# Patient Record
Sex: Male | Born: 1955 | Race: White | Hispanic: No | Marital: Married | Smoking: Never smoker
Health system: Southern US, Community
[De-identification: ages and names within clinical notes are randomized; demographics above are authoritative.]

## PROBLEM LIST (undated history)

## (undated) DIAGNOSIS — T754XXA Electrocution, initial encounter: Secondary | ICD-10-CM

## (undated) DIAGNOSIS — J45909 Unspecified asthma, uncomplicated: Secondary | ICD-10-CM

## (undated) DIAGNOSIS — J189 Pneumonia, unspecified organism: Secondary | ICD-10-CM

## (undated) DIAGNOSIS — IMO0002 Reserved for concepts with insufficient information to code with codable children: Secondary | ICD-10-CM

## (undated) DIAGNOSIS — G43709 Chronic migraine without aura, not intractable, without status migrainosus: Secondary | ICD-10-CM

## (undated) DIAGNOSIS — T7840XA Allergy, unspecified, initial encounter: Secondary | ICD-10-CM

## (undated) DIAGNOSIS — M797 Fibromyalgia: Secondary | ICD-10-CM

## (undated) DIAGNOSIS — S20229A Contusion of unspecified back wall of thorax, initial encounter: Secondary | ICD-10-CM

## (undated) DIAGNOSIS — I670 Dissection of cerebral arteries, nonruptured: Secondary | ICD-10-CM

## (undated) DIAGNOSIS — F419 Anxiety disorder, unspecified: Secondary | ICD-10-CM

## (undated) DIAGNOSIS — C20 Malignant neoplasm of rectum: Secondary | ICD-10-CM

## (undated) DIAGNOSIS — G8929 Other chronic pain: Secondary | ICD-10-CM

## (undated) DIAGNOSIS — K219 Gastro-esophageal reflux disease without esophagitis: Secondary | ICD-10-CM

## (undated) DIAGNOSIS — I255 Ischemic cardiomyopathy: Secondary | ICD-10-CM

## (undated) DIAGNOSIS — I639 Cerebral infarction, unspecified: Secondary | ICD-10-CM

## (undated) HISTORY — DX: Chronic migraine without aura, not intractable, without status migrainosus: G43.709

## (undated) HISTORY — DX: Anxiety disorder, unspecified: F41.9

## (undated) HISTORY — PX: ROTATOR CUFF REPAIR: SHX139

## (undated) HISTORY — PX: VASECTOMY: SHX75

## (undated) HISTORY — PX: SPINAL CORD STIMULATOR IMPLANT: SHX2422

## (undated) HISTORY — DX: Cerebral infarction, unspecified: I63.9

## (undated) HISTORY — PX: SPINE SURGERY: SHX786

## (undated) HISTORY — PX: COLONOSCOPY: SHX174

## (undated) HISTORY — DX: Contusion of unspecified back wall of thorax, initial encounter: S20.229A

## (undated) HISTORY — DX: Electrocution, initial encounter: T75.4XXA

## (undated) HISTORY — DX: Allergy, unspecified, initial encounter: T78.40XA

## (undated) HISTORY — PX: CHOLECYSTECTOMY: SHX55

## (undated) HISTORY — PX: OTHER SURGICAL HISTORY: SHX169

## (undated) HISTORY — PX: CRYO INTERCOSTAL NERVE BLOCK: SHX6522

## (undated) HISTORY — DX: Fibromyalgia: M79.7

## (undated) HISTORY — DX: Dissection of cerebral arteries, nonruptured: I67.0

## (undated) HISTORY — PX: TMJ ARTHROSCOPY: SHX1067

## (undated) HISTORY — PX: COSMETIC SURGERY: SHX468

## (undated) HISTORY — DX: Reserved for concepts with insufficient information to code with codable children: IMO0002

## (undated) HISTORY — DX: Other chronic pain: G89.29

---

## 1998-08-04 ENCOUNTER — Encounter: Payer: Self-pay | Admitting: Internal Medicine

## 1998-08-04 ENCOUNTER — Ambulatory Visit (HOSPITAL_COMMUNITY): Admission: RE | Admit: 1998-08-04 | Discharge: 1998-08-04 | Payer: Self-pay | Admitting: Internal Medicine

## 1998-08-24 ENCOUNTER — Ambulatory Visit (HOSPITAL_COMMUNITY): Admission: RE | Admit: 1998-08-24 | Discharge: 1998-08-24 | Payer: Self-pay | Admitting: Pulmonary Disease

## 1998-12-30 ENCOUNTER — Encounter: Payer: Self-pay | Admitting: Pulmonary Disease

## 1998-12-30 ENCOUNTER — Ambulatory Visit (HOSPITAL_COMMUNITY): Admission: RE | Admit: 1998-12-30 | Discharge: 1998-12-30 | Payer: Self-pay | Admitting: Pulmonary Disease

## 2001-10-10 ENCOUNTER — Ambulatory Visit (HOSPITAL_COMMUNITY): Admission: RE | Admit: 2001-10-10 | Discharge: 2001-10-10 | Payer: Self-pay | Admitting: Pulmonary Disease

## 2001-10-10 ENCOUNTER — Encounter: Payer: Self-pay | Admitting: Pulmonary Disease

## 2002-01-03 ENCOUNTER — Ambulatory Visit: Admission: RE | Admit: 2002-01-03 | Discharge: 2002-01-03 | Payer: Self-pay | Admitting: Pulmonary Disease

## 2002-03-14 ENCOUNTER — Emergency Department (HOSPITAL_COMMUNITY): Admission: EM | Admit: 2002-03-14 | Discharge: 2002-03-14 | Payer: Self-pay | Admitting: Emergency Medicine

## 2002-03-14 ENCOUNTER — Encounter: Payer: Self-pay | Admitting: Emergency Medicine

## 2002-03-31 ENCOUNTER — Encounter: Payer: Self-pay | Admitting: *Deleted

## 2002-03-31 ENCOUNTER — Inpatient Hospital Stay (HOSPITAL_COMMUNITY): Admission: EM | Admit: 2002-03-31 | Discharge: 2002-04-03 | Payer: Self-pay | Admitting: *Deleted

## 2002-04-01 ENCOUNTER — Encounter: Payer: Self-pay | Admitting: Neurology

## 2002-04-24 ENCOUNTER — Ambulatory Visit (HOSPITAL_COMMUNITY): Admission: RE | Admit: 2002-04-24 | Discharge: 2002-04-24 | Payer: Self-pay | Admitting: Neurology

## 2002-04-24 ENCOUNTER — Encounter: Payer: Self-pay | Admitting: Neurology

## 2002-07-08 ENCOUNTER — Ambulatory Visit (HOSPITAL_COMMUNITY): Admission: RE | Admit: 2002-07-08 | Discharge: 2002-07-08 | Payer: Self-pay | Admitting: Neurology

## 2002-07-08 ENCOUNTER — Encounter: Payer: Self-pay | Admitting: Neurology

## 2005-04-04 ENCOUNTER — Ambulatory Visit (HOSPITAL_COMMUNITY): Admission: RE | Admit: 2005-04-04 | Discharge: 2005-04-04 | Payer: Self-pay | Admitting: Neurology

## 2015-10-15 DIAGNOSIS — G8929 Other chronic pain: Secondary | ICD-10-CM | POA: Diagnosis not present

## 2015-11-13 DIAGNOSIS — G8929 Other chronic pain: Secondary | ICD-10-CM | POA: Diagnosis not present

## 2016-01-13 DIAGNOSIS — G8929 Other chronic pain: Secondary | ICD-10-CM | POA: Diagnosis not present

## 2016-02-12 DIAGNOSIS — R351 Nocturia: Secondary | ICD-10-CM | POA: Diagnosis not present

## 2016-02-12 DIAGNOSIS — R3911 Hesitancy of micturition: Secondary | ICD-10-CM | POA: Diagnosis not present

## 2016-02-12 DIAGNOSIS — G8929 Other chronic pain: Secondary | ICD-10-CM | POA: Diagnosis not present

## 2016-02-12 DIAGNOSIS — R300 Dysuria: Secondary | ICD-10-CM | POA: Diagnosis not present

## 2016-02-12 DIAGNOSIS — R350 Frequency of micturition: Secondary | ICD-10-CM | POA: Diagnosis not present

## 2016-03-22 ENCOUNTER — Telehealth: Payer: Self-pay | Admitting: Physical Medicine & Rehabilitation

## 2016-03-22 NOTE — Telephone Encounter (Signed)
Patient was told by Dr. Naaman Plummer that he would see him.  This referral I had told you about and you were going to pull it for him to look at it.  Patient is a friend of one of his patients, Winferd Humphrey.

## 2016-03-24 ENCOUNTER — Encounter: Payer: Self-pay | Admitting: Physical Medicine & Rehabilitation

## 2016-03-24 NOTE — Telephone Encounter (Signed)
Approved for an appointment. Patient has a SCS and is on Methadone.

## 2016-05-02 ENCOUNTER — Encounter: Payer: Self-pay | Admitting: Physical Medicine & Rehabilitation

## 2016-05-02 ENCOUNTER — Encounter: Payer: Medicare Other | Attending: Physical Medicine & Rehabilitation | Admitting: Physical Medicine & Rehabilitation

## 2016-05-02 VITALS — BP 125/87 | HR 92 | Resp 16

## 2016-05-02 DIAGNOSIS — M797 Fibromyalgia: Secondary | ICD-10-CM | POA: Insufficient documentation

## 2016-05-02 DIAGNOSIS — Z5181 Encounter for therapeutic drug level monitoring: Secondary | ICD-10-CM

## 2016-05-02 DIAGNOSIS — R5383 Other fatigue: Secondary | ICD-10-CM | POA: Insufficient documentation

## 2016-05-02 DIAGNOSIS — G894 Chronic pain syndrome: Secondary | ICD-10-CM | POA: Diagnosis not present

## 2016-05-02 DIAGNOSIS — G8929 Other chronic pain: Secondary | ICD-10-CM | POA: Insufficient documentation

## 2016-05-02 DIAGNOSIS — R5382 Chronic fatigue, unspecified: Secondary | ICD-10-CM | POA: Diagnosis not present

## 2016-05-02 DIAGNOSIS — E559 Vitamin D deficiency, unspecified: Secondary | ICD-10-CM

## 2016-05-02 DIAGNOSIS — M545 Low back pain: Secondary | ICD-10-CM | POA: Insufficient documentation

## 2016-05-02 DIAGNOSIS — Z79899 Other long term (current) drug therapy: Secondary | ICD-10-CM

## 2016-05-02 DIAGNOSIS — G950 Syringomyelia and syringobulbia: Secondary | ICD-10-CM | POA: Diagnosis not present

## 2016-05-02 MED ORDER — GABAPENTIN 100 MG PO CAPS: 100.0000 mg | ORAL_CAPSULE | Freq: Three times a day (TID) | ORAL | 3 refills | Status: DC

## 2016-05-02 NOTE — Patient Instructions (Signed)
ONCE I HAVE CONFIRMATION THAT YOUR URINE SPECIMEN IS CONSISTENT WITH YOUR HISTORY AND PRESCRIBED MEDICATIONS, I WILL BE WILLING TO PRESCRIBE YOUR PAIN MEDICATION. THE RESULTS OF YOUR URINE TESTING COULD TAKE A WEEK OR MORE TO RETURN, HOWEVER.  IF WE DO NOT CONTACT YOU REGARDING THESE RESULTS WITHIN 10 DAYS, PLEASE CONTACT us.    PLEASE CALL ME WITH ANY PROBLEMS OR QUESTIONS [phone]   LUMBAR FILMS FROM [facility]?????

## 2016-05-02 NOTE — Progress Notes (Signed)
Subjective:    Patient ID: Derrick Johnson, male    DOB: 01/30/1956, 60 y.o.   MRN: [id number]  HPI   This is an initial visit for Derrick Johnson who comes in today with complaints of chronic pain. He has been treated for pain for 20+ years at [facility] for pain related to his low back, neck, entire body. He apparently has had two different SCS's (one of which was removed) with provided transient benefit. He also has had numerous back injections, alternative treatments, etc over the ears. His providers at [facility] ultimately resorted to pain/palliative mgt. 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 I found a report of a cervical MRI form 2002 which notes small disc/osteophyte complex at C5-6 but has no mention of syrinx. There is a neurology note from around the same time which denies the existence of a syrinx. He also reports that he has  "scar tissue" in his lumbar area, but I have nothing to substantiate it by other than a few notes which reference a chronic radiculitis. There is not a lumbar MRI in the system nor through [facility] that I can find.  For exercise, he mows the grass and does simple things around the house. If he tries to do anything more, "he'll be in the bed for the week". 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 I was able to review a few hospital documents related to that stay. He was seen by Dr. Marney Setting.   In looking through some of the other information the patient had with him (mostly written notes on paper he's taken over the years), he's seen numerous specialists for pain in the [place] area including [facility] who have taken care of his left rotator cuff injury. He has had three left rotator cuff surgeries since the onset..   Overall his pain is diffuse at  this point. He has pain from head to toe. His family MD, Dr. Arelia Sneddon is managing his pain medications as [facility] encouraged him to have his pain managed locally. He states that Dr. Arelia Sneddon has tried to reduce his meds further over the years.   Other problems include insomnia and difficulties with leg "spasms" at night. Migraines occur monthly for which he takes imitrex---barometric pressure affects them.   Medications: He uses adderall for ADHD, 20mg  daily. He is on trazodone for sleep 150mg  qhs, valium 2.5 TID, methadone 20mg  once daily, imitrex 100mg  daily prn, in addition to albuterol MDI, and docusate.    Pain Inventory Average Pain 7 Pain Right Now 7 My pain is constant, sharp, burning, dull, stabbing and aching  In the last 24 hours, has pain interfered with the following? General activity na Relation with others na Enjoyment of life na What TIME of day is your pain at its worst? night Sleep (in general) Poor  Pain is worse with: walking, bending, sitting, standing and some activites Pain improves with: rest, heat/ice, pacing activities, medication, TENS and injections Relief from Meds: na  Mobility walk with assistance use a cane use a walker how many minutes can you walk? ? ability to climb steps?  yes do you drive?  yes  Function disabled: date disabled 10/2002 I need assistance with the following:  meal prep,  household duties and shopping  Neuro/Psych weakness numbness tremor tingling trouble walking spasms dizziness confusion suicidal thoughts  Prior Studies Any changes since last visit?  no  Physicians involved in your care Any changes since last visit?  no Primary care [facility] ADHD Chronic pain syndrome with two SCS's.  Left rotator cuff disorder with repair x 3. Asthma Remote ETOH hx  No family history on file. Social History   Social History  . Marital status: Married    Spouse name: N/A  . Number of children: N/A  . Years of  education: N/A   Social History Main Topics  . Smoking status: Not on file  . Smokeless tobacco: Not on file  . Alcohol use Not on file  . Drug use: Unknown  . Sexual activity: Not on file   Other Topics Concern  . Not on file   Social History Narrative  . No narrative on file   No past surgical history on file. No past medical history on file. BP 125/87 (BP Location: Left Arm, Patient Position: Sitting, Cuff Size: Normal)   Pulse 92   Resp 16   SpO2 97%   Opioid Risk Score:   Fall Risk Score:  `1  Depression screen PHQ 2/9  Depression screen PHQ 2/9 05/02/2016  Decreased Interest 3  Down, Depressed, Hopeless 3  PHQ - 2 Score 6  Altered sleeping 3  Tired, decreased energy 3  Change in appetite 2  Feeling bad or failure about yourself  3  Trouble concentrating 3  Moving slowly or fidgety/restless 3  Suicidal thoughts 3  PHQ-9 Score 26  Difficult doing work/chores Extremely dIfficult   Review of Systems  Constitutional: Positive for diaphoresis.  Respiratory: Positive for cough, shortness of breath and wheezing.   Cardiovascular: Positive for leg swelling.  Gastrointestinal: Positive for abdominal pain and constipation.  Genitourinary: Positive for dysuria.  Skin: Positive for rash.  All other systems reviewed and are negative.      Objective:   Physical Exam  General: Alert and oriented x 3, No apparent distress. Slightly frail appearing for age 31: Head is normocephalic, atraumatic, PERRLA, EOMI, sclera anicteric, oral mucosa pink and moist, dentition intact, ext ear canals clear,  Neck: Supple without JVD or lymphadenopathy Heart: Reg rate and rhythm. No murmurs rubs or gallops Chest: CTA bilaterally without wheezes, rales, or rhonchi; no distress Abdomen: Soft, non-tender, non-distended, bowel sounds positive. Extremities: No clubbing, cyanosis, or edema. Pulses are 2+ Skin: Clean and intact without signs of breakdown. He has surgical scars on the  thoracic and lumbar spine.  Neuro: Pt is cognitively appropriate with normal insight, memory, and awareness. Cranial nerves 2-12 are intact. Sensory exam is decreased to LT/cold in the distal legs/feet as well as the hands. Reflexes are 3+ in all 4's. Fine motor coordination is fair although he has a mild tremor bilaterally---saw no frank ataxia.  . Motor function is grossly 5/5 except for where he displayed pain.  Musculoskeletal: His cervical ROM is limited in all planes. I did not see restriction in any predominant direction however. In the lumbar spine he is able to bend to about 50 degrees, extend to about 10 and rotate and lateral bend to around 20 degrees. He has a slight lumbar levosciolosis and clockwise rotation of the back and pelvis. He ambulated for me and did not to display any antalgia or favoring of the back for short distances. SLR testing was equivocal He is limited with left  shoulder abduction as well as IR/ER. Impingement testing positive. No specific hand pathology noted. His right first toe appeared sensitive to touch (states he broke it recently).  Psych: Pt's affect is appropriate. Pt is cooperative. He does appear a bit anxious at times         Assessment & Plan:  1. Chronic pain syndrome consistent with fibromyalgia--pt has had NUMEROUS providers and diagnoses over the years 2. Peripheral neuropathy of unclear origin 3. Hx of lumbar spine pain with radiculitis, previous SCS.(still has unit implanted--non-functional) 4. 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 Hx of left RTC injury and surgery x 3    Plan: 1. Will check labs to look for potentially nutritional/endocrine causes of his symptoms.  2. Trial of gabapentin 100mg  TID for generalized pain as this needs to be addressed. 3. UDS/ CSA was signed. Would be willing to take over his medication regimen 4. MRI of lumbar spine and cervical spine will likely be in order to discern the extent  of disease in these areas. I see know myelopathic signs on examination today. His DTR's were hyperreflexic, but this may be due to his prior CVA's 5. Forty-five minutes of face to face patient care time were spent during this visit. All questions were encouraged and answered. Follow up is in one month.

## 2016-05-03 LAB — COMPREHENSIVE METABOLIC PANEL
A/G RATIO: 1.5 (ref 1.2–2.2)
ALBUMIN: 4.8 g/dL (ref 3.5–5.5)
ALT: 27 IU/L (ref 0–44)
AST: 22 IU/L (ref 0–40)
Alkaline Phosphatase: 72 IU/L (ref 39–117)
BILIRUBIN TOTAL: 0.3 mg/dL (ref 0.0–1.2)
BUN / CREAT RATIO: 10 (ref 9–20)
BUN: 9 mg/dL (ref 6–24)
CALCIUM: 10.4 mg/dL — AB (ref 8.7–10.2)
CHLORIDE: 98 mmol/L (ref 96–106)
CO2: 27 mmol/L (ref 18–29)
Creatinine, Ser: 0.91 mg/dL (ref 0.76–1.27)
GFR, EST AFRICAN AMERICAN: 106 mL/min/{1.73_m2} (ref >59)
GFR, EST NON AFRICAN AMERICAN: 92 mL/min/{1.73_m2} (ref >59)
Globulin, Total: 3.2 g/dL (ref 1.5–4.5)
Glucose: 99 mg/dL (ref 65–99)
POTASSIUM: 5.4 mmol/L — AB (ref 3.5–5.2)
Sodium: 140 mmol/L (ref 134–144)
TOTAL PROTEIN: 8 g/dL (ref 6.0–8.5)

## 2016-05-03 LAB — MAGNESIUM: MAGNESIUM: 2.1 mg/dL (ref 1.6–2.3)

## 2016-05-03 LAB — T4, FREE: Free T4: 1 ng/dL (ref 0.82–1.77)

## 2016-05-03 LAB — CBC
HEMOGLOBIN: 15.6 g/dL (ref 12.6–17.7)
Hematocrit: 45.2 % (ref 37.5–51.0)
MCH: 29.4 pg (ref 26.6–33.0)
MCHC: 34.5 g/dL (ref 31.5–35.7)
MCV: 85 fL (ref 79–97)
Platelets: 289 10*3/uL (ref 150–379)
RBC: 5.3 x10E6/uL (ref 4.14–5.80)
RDW: 13.5 % (ref 12.3–15.4)
WBC: 9.9 10*3/uL (ref 3.4–10.8)

## 2016-05-03 LAB — VITAMIN D 25 HYDROXY (VIT D DEFICIENCY, FRACTURES): VIT D 25 HYDROXY: 21.3 ng/mL — AB (ref 30.0–100.0)

## 2016-05-03 LAB — VITAMIN B12: Vitamin B-12: 561 pg/mL (ref 211–946)

## 2016-05-03 LAB — T3, FREE: T3, Free: 3.1 pg/mL (ref 2.0–4.4)

## 2016-05-03 LAB — TESTOSTERONE, FREE: Testosterone, Free: 6.8 pg/mL — ABNORMAL LOW (ref 7.2–24.0)

## 2016-05-03 LAB — TSH: TSH: 1.28 u[IU]/mL (ref 0.450–4.500)

## 2016-05-06 ENCOUNTER — Telehealth: Payer: Self-pay | Admitting: Physical Medicine & Rehabilitation

## 2016-05-06 MED ORDER — VITAMIN D (ERGOCALCIFEROL) 1.25 MG (50000 UNIT) PO CAPS: 50000.0000 [IU] | ORAL_CAPSULE | ORAL | 3 refills | Status: DC

## 2016-05-06 MED ORDER — TESTOSTERONE CYPIONATE 100 MG/ML IM SOLN
100.0000 mg | INTRAMUSCULAR | 1 refills | Status: DC
Start: 2016-05-06 — End: 2016-08-31

## 2016-05-06 NOTE — Telephone Encounter (Signed)
Vitamin D levels and testosterone are low. Potassium was slightly hight, but i'm unsure of the clinical significance as there are no meds or obvious sources for this. His low D and T levels certainly could be accounting for his fatigue and potentially pain.   I would recommend 50,000iu vitamin d weekly #4, 3 RF And testosterone injection 100mg  IM every 2 weeks. 3 month supply.   Recheck levels in 2 months.   thanks

## 2016-05-06 NOTE — Telephone Encounter (Signed)
Orders placed and Derrick Johnson notified

## 2016-05-11 LAB — TOXASSURE SELECT,+ANTIDEPR,UR: PDF: 0

## 2016-05-12 NOTE — Progress Notes (Signed)
Urine drug screen for this encounter is consistent for prescribed medications.   

## 2016-05-17 ENCOUNTER — Telehealth: Payer: Self-pay | Admitting: Physical Medicine & Rehabilitation

## 2016-05-17 NOTE — Telephone Encounter (Signed)
Patient has a question about the Testosterone Injection and what area is this injected at.  Please call him at [phone].

## 2016-05-18 NOTE — Telephone Encounter (Signed)
Contacted patient, he read through the instructions and just wanted to clarify if the thigh was okay for injection site. I consulted our nurse, Sybil. She said as long as there is sufficient muscle mass that would be fine, otherwise gluteal is the desired route. I informed patient and he understood

## 2016-05-30 ENCOUNTER — Encounter: Payer: Self-pay | Admitting: Physical Medicine & Rehabilitation

## 2016-05-30 ENCOUNTER — Encounter: Payer: Medicare Other | Attending: Physical Medicine & Rehabilitation | Admitting: Physical Medicine & Rehabilitation

## 2016-05-30 ENCOUNTER — Telehealth: Payer: Self-pay | Admitting: Physical Medicine & Rehabilitation

## 2016-05-30 VITALS — BP 119/80 | HR 54 | Ht 68.0 in | Wt 180.0 lb

## 2016-05-30 DIAGNOSIS — M797 Fibromyalgia: Secondary | ICD-10-CM

## 2016-05-30 DIAGNOSIS — G950 Syringomyelia and syringobulbia: Secondary | ICD-10-CM

## 2016-05-30 DIAGNOSIS — Z5181 Encounter for therapeutic drug level monitoring: Secondary | ICD-10-CM

## 2016-05-30 DIAGNOSIS — G43009 Migraine without aura, not intractable, without status migrainosus: Secondary | ICD-10-CM | POA: Diagnosis not present

## 2016-05-30 DIAGNOSIS — Z79899 Other long term (current) drug therapy: Secondary | ICD-10-CM

## 2016-05-30 DIAGNOSIS — G894 Chronic pain syndrome: Secondary | ICD-10-CM | POA: Diagnosis present

## 2016-05-30 DIAGNOSIS — M545 Low back pain: Secondary | ICD-10-CM | POA: Insufficient documentation

## 2016-05-30 DIAGNOSIS — G8929 Other chronic pain: Secondary | ICD-10-CM | POA: Diagnosis not present

## 2016-05-30 DIAGNOSIS — R5382 Chronic fatigue, unspecified: Secondary | ICD-10-CM

## 2016-05-30 MED ORDER — GABAPENTIN 100 MG PO CAPS: 100.0000 mg | ORAL_CAPSULE | Freq: Three times a day (TID) | ORAL | 3 refills | Status: DC

## 2016-05-30 MED ORDER — METHADONE HCL 10 MG PO TABS: 10.0000 mg | ORAL_TABLET | Freq: Two times a day (BID) | ORAL | 0 refills | Status: DC

## 2016-05-30 MED ORDER — GABAPENTIN 300 MG PO CAPS: 300.0000 mg | ORAL_CAPSULE | Freq: Three times a day (TID) | ORAL | 3 refills | Status: DC

## 2016-05-30 MED ORDER — AMPHETAMINE-DEXTROAMPHETAMINE 20 MG PO TABS: 20.0000 mg | ORAL_TABLET | Freq: Every day | ORAL | 0 refills | Status: DC

## 2016-05-30 MED ORDER — SUMATRIPTAN SUCCINATE 100 MG PO TABS: 100.0000 mg | ORAL_TABLET | ORAL | 3 refills | Status: DC

## 2016-05-30 NOTE — Telephone Encounter (Signed)
Patient was in today and his pharmacy filled his Testosterone Cypionate 1/2 ml and the sheet he was given had 1 ml.  He needs clarification on this.  Please call patient.

## 2016-05-30 NOTE — Progress Notes (Signed)
Subjective:    Patient ID: Derrick Johnson, male    DOB: 09/26/1955, 60 y.o.   MRN: [id number]  HPI   Derrick Johnson is here in follow up of his chronic pain. He feels that the gabapentin has been helpful. He is having no tolerance issues. It has helped his chronic spasms and his generalized pain. We started him on testosterone and vitamin d for deficiency. He's had no problems taking these meds.   His UDS was consistent. He receives methadone and valium as well as adderall from his primary.     Pain Inventory Average Pain 7 Pain Right Now 7 My pain is sharp, burning, dull, stabbing and aching  In the last 24 hours, has pain interfered with the following? General activity 7 Relation with others 7 Enjoyment of life 8 What TIME of day is your pain at its worst? night Sleep (in general) Poor  Pain is worse with: walking, bending, sitting and standing Pain improves with: rest, heat/ice, pacing activities, medication, TENS and injections Relief from Meds: 5  Mobility walk with assistance use a cane use a walker how many minutes can you walk? varies with terrain ability to climb steps?  yes do you drive?  yes Do you have any goals in this area?  no  Function disabled: date disabled 08/2003 I need assistance with the following:  meal prep, household duties and shopping Do you have any goals in this area?  no  Neuro/Psych weakness numbness tremor tingling trouble walking spasms dizziness confusion depression anxiety  Prior Studies Any changes since last visit?  no  Physicians involved in your care Any changes since last visit?  no   Family History  Problem Relation Age of Onset  . Hyperlipidemia Mother   . Heart disease Father    Social History   Social History  . Marital status: Married    Spouse name: N/A  . Number of children: N/A  . Years of education: N/A   Social History Main Topics  . Smoking status: Never Smoker  . Smokeless tobacco: Never Used  .  Alcohol use None  . Drug use: No  . Sexual activity: Not Asked   Other Topics Concern  . None   Social History Narrative  . None   Past Surgical History:  Procedure Laterality Date  . SPINE SURGERY     History reviewed. No pertinent past medical history. BP 119/80   Pulse (!) 54   SpO2 96%   Opioid Risk Score:   Fall Risk Score:  `1  Depression screen PHQ 2/9  Depression screen PHQ 2/9 05/02/2016  Decreased Interest 3  Down, Depressed, Hopeless 3  PHQ - 2 Score 6  Altered sleeping 3  Tired, decreased energy 3  Change in appetite 2  Feeling bad or failure about yourself  3  Trouble concentrating 3  Moving slowly or fidgety/restless 3  Suicidal thoughts 3  PHQ-9 Score 26  Difficult doing work/chores Extremely dIfficult    Review of Systems  HENT: Negative.   Eyes: Negative.   Respiratory: Negative.   Cardiovascular: Negative.   Gastrointestinal: Positive for abdominal pain and constipation.  Endocrine: Negative.   Genitourinary: Positive for difficulty urinating.  Musculoskeletal: Positive for back pain, gait problem and neck pain.  Skin: Negative.   Neurological: Positive for dizziness, tremors, weakness and numbness.  Hematological: Negative.   Psychiatric/Behavioral: Positive for confusion and dysphoric mood. The patient is nervous/anxious.        Objective:   Physical  Exam  General: Alert and oriented x 3, No apparent distress. Slightly frail appearing for age 66: Head is normocephalic, atraumatic, PERRLA, EOMI, sclera anicteric, oral mucosa pink and moist, dentition intact, ext ear canals clear,  Neck: Supple without JVD or lymphadenopathy Heart: Reg rate and rhythm. No murmurs rubs or gallops Chest: CTA bilaterally without wheezes, rales, or rhonchi; no distress Abdomen: Soft, non-tender, non-distended, bowel sounds positive. Extremities: No clubbing, cyanosis, or edema. Pulses are 2+ Skin: Clean and intact without signs of breakdown. He has  surgical scars on the thoracic and lumbar spine.  Neuro: Pt is cognitively appropriate with normal insight, memory, and awareness. Cranial nerves 2-12 are intact. Sensory exam is decreased to LT/cold in the distal legs/feet as well as the hands. Reflexes are 3+ in all 4's. Fine motor coordination is fair although he has a mild tremor bilaterally---saw no frank ataxia.  . Motor function is grossly 5/5 except for where he displayed pain.  Musculoskeletal: His cervical ROM is limited in all planes. I did not see restriction in any predominant direction however. In the lumbar spine he is able to bend to about 50 degrees, extend to about 10 and rotate and lateral bend to around 20 degrees. He has a slight lumbar levosciolosis and clockwise rotation of the back and pelvis. He ambulated for me and did not to display any antalgia or favoring of the back for short distances. SLR testing was equivocal He is limited with left shoulder abduction as well as IR/ER. Impingement testing positive. No specific hand pathology noted. His right first toe appeared sensitive to touch (states he broke it recently).  Psych: Pt's affect is appropriate. Pt is cooperative. He does appear a bit anxious at times         Assessment & Plan:  1. Chronic pain syndrome consistent with fibromyalgia--pt has had NUMEROUS providers and diagnoses over the years 2. Peripheral neuropathy of unclear origin 3. Hx of lumbar spine pain with radiculitis, previous SCS.(still has unit implanted--non-functional) 4. 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 Hx of left RTC injury and surgery x 3    Plan:  1. Testosterone and vitamin D supplement as prescribed. Check labs in October or November.   Refilled adderall 20mg  daily #30.  2. Increase gabapentin to 300mg  TID for generalized pain/spasm. 3. Refilled methadone and imitrex today. Taper off valium.  4. MRI of lumbar spine and cervical spine will likely be  in order to discern the extent of disease in these areas.  5. 30 minutes of face to face patient care time were spent during this visit. All questions were encouraged and answered. Follow up is in one month.

## 2016-05-30 NOTE — Telephone Encounter (Signed)
Pharmacy dispensed 200mg /ml bottle instead of 100 mg/ml  and Derrick Johnson is to take 100 mg injection every 14 days so he has to take 1/2 ml instead of one ml.  He understands.

## 2016-05-30 NOTE — Patient Instructions (Signed)
GABAPENTIN INCREASE: DAYS 1-5 100-100-300 DAYS 6-10 300-100-300 DAYS 11+ 300MG  THREE X DAILY  (USE UP THE 100MG  CAPS UNTIL GONE)   DECREASE VALIUM TO 1/2 TAB DAILY UNTIL PILLS GONE    PLEASE CALL ME WITH ANY PROBLEMS OR QUESTIONS [phone])

## 2016-06-01 ENCOUNTER — Telehealth: Payer: Self-pay | Admitting: Physical Medicine & Rehabilitation

## 2016-06-01 DIAGNOSIS — Z79899 Other long term (current) drug therapy: Secondary | ICD-10-CM

## 2016-06-01 DIAGNOSIS — G894 Chronic pain syndrome: Secondary | ICD-10-CM

## 2016-06-01 DIAGNOSIS — R5382 Chronic fatigue, unspecified: Secondary | ICD-10-CM

## 2016-06-01 DIAGNOSIS — G950 Syringomyelia and syringobulbia: Secondary | ICD-10-CM

## 2016-06-01 DIAGNOSIS — M797 Fibromyalgia: Secondary | ICD-10-CM

## 2016-06-01 DIAGNOSIS — Z5181 Encounter for therapeutic drug level monitoring: Secondary | ICD-10-CM

## 2016-06-01 MED ORDER — METHADONE HCL 10 MG PO TABS: 10.0000 mg | ORAL_TABLET | Freq: Two times a day (BID) | ORAL | 0 refills | Status: DC

## 2016-06-01 NOTE — Telephone Encounter (Signed)
Sig states take 1 tablet BID, and dispense count is #30

## 2016-06-01 NOTE — Telephone Encounter (Signed)
Patient has questions about Methadone and quantity.  Please call him at [phone].

## 2016-06-01 NOTE — Telephone Encounter (Signed)
Should be #60.  #30 was default and I didn't catch it. My apologies

## 2016-06-02 ENCOUNTER — Telehealth: Payer: Self-pay | Admitting: *Deleted

## 2016-06-02 ENCOUNTER — Telehealth: Payer: Self-pay | Admitting: Physical Medicine & Rehabilitation

## 2016-06-02 NOTE — Telephone Encounter (Signed)
erroe

## 2016-06-02 NOTE — Telephone Encounter (Signed)
Patient contacted, new script written and signed to reflect 2 tabs a day. Patient will come by the script

## 2016-06-02 NOTE — Telephone Encounter (Signed)
Patient would like to speak to someone about his methadone.Marland Kitchen

## 2016-06-06 NOTE — Telephone Encounter (Signed)
error 

## 2016-06-29 ENCOUNTER — Encounter: Payer: Self-pay | Admitting: Registered Nurse

## 2016-06-29 ENCOUNTER — Encounter: Payer: Medicare Other | Attending: Physical Medicine & Rehabilitation | Admitting: Registered Nurse

## 2016-06-29 VITALS — BP 126/84 | HR 77 | Resp 14

## 2016-06-29 DIAGNOSIS — G894 Chronic pain syndrome: Secondary | ICD-10-CM | POA: Diagnosis not present

## 2016-06-29 DIAGNOSIS — E291 Testicular hypofunction: Secondary | ICD-10-CM | POA: Diagnosis not present

## 2016-06-29 DIAGNOSIS — Z5181 Encounter for therapeutic drug level monitoring: Secondary | ICD-10-CM

## 2016-06-29 DIAGNOSIS — M545 Low back pain: Secondary | ICD-10-CM | POA: Insufficient documentation

## 2016-06-29 DIAGNOSIS — R5382 Chronic fatigue, unspecified: Secondary | ICD-10-CM

## 2016-06-29 DIAGNOSIS — G950 Syringomyelia and syringobulbia: Secondary | ICD-10-CM | POA: Diagnosis not present

## 2016-06-29 DIAGNOSIS — M797 Fibromyalgia: Secondary | ICD-10-CM

## 2016-06-29 DIAGNOSIS — G8929 Other chronic pain: Secondary | ICD-10-CM | POA: Insufficient documentation

## 2016-06-29 DIAGNOSIS — Z79899 Other long term (current) drug therapy: Secondary | ICD-10-CM

## 2016-06-29 MED ORDER — AMPHETAMINE-DEXTROAMPHETAMINE 20 MG PO TABS
20.0000 mg | ORAL_TABLET | Freq: Every day | ORAL | 0 refills | Status: DC
Start: 2016-06-29 — End: 2016-07-27

## 2016-06-29 MED ORDER — METHADONE HCL 10 MG PO TABS: 10.0000 mg | ORAL_TABLET | Freq: Two times a day (BID) | ORAL | 0 refills | Status: DC

## 2016-06-29 NOTE — Progress Notes (Signed)
Subjective:    Patient ID: Derrick Johnson, male    DOB: 01-31-1956, 60 y.o.   MRN: [id number]  HPI: [name] is a 60 year old male who returns for follow up appointment for chronic pain  and medication refill. He states his pain is located in his neck, left shoulder and lower back. He rates his pain 5. His current exercise regime is walking with straight cane.  Pain Inventory Average Pain 7 Pain Right Now 5 My pain is sharp, dull, stabbing, tingling and aching  In the last 24 hours, has pain interfered with the following? General activity 6 Relation with others 5 Enjoyment of life 6 What TIME of day is your pain at its worst? All Sleep (in general) Poor  Pain is worse with: walking, bending, sitting, inactivity, standing, unsure and some activites Pain improves with: heat/ice and medication Relief from Meds: 5  Mobility use a cane use a walker ability to climb steps?  yes do you drive?  yes transfers alone Do you have any goals in this area?  yes  Function disabled: date disabled 2006 I need assistance with the following:  meal prep, household duties and shopping Do you have any goals in this area?  yes  Neuro/Psych weakness numbness tremor tingling spasms confusion  Prior Studies Any changes since last visit?  no  Physicians involved in your care Any changes since last visit?  no   Family History  Problem Relation Age of Onset  . Hyperlipidemia Mother   . Heart disease Father    Social History   Social History  . Marital status: Married    Spouse name: N/A  . Number of children: N/A  . Years of education: N/A   Social History Main Topics  . Smoking status: Never Smoker  . Smokeless tobacco: Never Used  . Alcohol use None  . Drug use: No  . Sexual activity: Not Asked   Other Topics Concern  . None   Social History Narrative  . None   Past Surgical History:  Procedure Laterality Date  . SPINE SURGERY     No past medical history on  file. BP 126/84   Pulse 77   Resp 14   SpO2 97%   Opioid Risk Score:   Fall Risk Score:  `1  Depression screen PHQ 2/9  Depression screen PHQ 2/9 05/02/2016  Decreased Interest 3  Down, Depressed, Hopeless 3  PHQ - 2 Score 6  Altered sleeping 3  Tired, decreased energy 3  Change in appetite 2  Feeling bad or failure about yourself  3  Trouble concentrating 3  Moving slowly or fidgety/restless 3  Suicidal thoughts 3  PHQ-9 Score 26  Difficult doing work/chores Extremely dIfficult    Review of Systems  Respiratory: Positive for cough, shortness of breath and wheezing.        Asthma Allergies   Musculoskeletal:       Limb swelling  All other systems reviewed and are negative.      Objective:   Physical Exam  Constitutional: He is oriented to person, place, and time. He appears well-developed and well-nourished.  HENT:  Head: Normocephalic and atraumatic.  Neck: Normal range of motion. Neck supple.  Cardiovascular: Normal rate and regular rhythm.   Pulmonary/Chest: Effort normal and breath sounds normal.  Musculoskeletal:  Normal Muscle Bulk and Muscle Testing Reveals: Upper Extremities: Full ROM and Muscle Strength on Right 5/5 and Left 4/5 Thoracic Hypersensitivity: T-1- T-4 Mainly Left  Side Lumbar Hypersensitivity Lower Extremities: Full ROM and Muscle Strength 5/5 Right Lower Extremity Flexion Produces Pain Right Lower Extremity Left Lower Extremity Flexion Produces Pain into  Left Hip Arises from Table slowly using straight cane for support Narrow Based gait  Neurological: He is alert and oriented to person, place, and time.  Skin: Skin is warm and dry.  Psychiatric: He has a normal mood and affect.  Nursing note and vitals reviewed.         Assessment & Plan:  1. Chronic pain syndrome consistent with fibromyalgia: Continue Gabapentin Refilled Methadone 10 mg one tablet BID #60.  We will continue the opioid monitoring program, this consists of  regular clinic visits, examinations, urine drug screen, pill counts as well as use of [place] Controlled Substance Reporting System. 2. Peripheral neuropathy: Continue Gabapentin 3. Hx of lumbar spine pain with radiculitis, previous SCS.(still has unit implanted--non-functional). Continue with medication regime and continue to monitor. 4. 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 ADHD: Continue Adderall. Refilled Adderall 20 mg daily #30  5. Hx of left RTC injury and surgery x 3: Continue to Monitor 6. Hypogonadism: Continue Testosterone, check level next month.    30 minutes of face to face patient care time was spent during this visit. All questions were encouraged and answered.   Follow up  in one month.

## 2016-07-27 ENCOUNTER — Encounter: Payer: Self-pay | Admitting: Registered Nurse

## 2016-07-27 ENCOUNTER — Encounter: Payer: Medicare Other | Attending: Physical Medicine & Rehabilitation | Admitting: Registered Nurse

## 2016-07-27 VITALS — BP 122/87 | HR 98 | Resp 14

## 2016-07-27 DIAGNOSIS — R5382 Chronic fatigue, unspecified: Secondary | ICD-10-CM

## 2016-07-27 DIAGNOSIS — M797 Fibromyalgia: Secondary | ICD-10-CM

## 2016-07-27 DIAGNOSIS — E291 Testicular hypofunction: Secondary | ICD-10-CM

## 2016-07-27 DIAGNOSIS — Z79899 Other long term (current) drug therapy: Secondary | ICD-10-CM

## 2016-07-27 DIAGNOSIS — M545 Low back pain: Secondary | ICD-10-CM | POA: Diagnosis not present

## 2016-07-27 DIAGNOSIS — G8929 Other chronic pain: Secondary | ICD-10-CM | POA: Insufficient documentation

## 2016-07-27 DIAGNOSIS — G894 Chronic pain syndrome: Secondary | ICD-10-CM

## 2016-07-27 DIAGNOSIS — Z5181 Encounter for therapeutic drug level monitoring: Secondary | ICD-10-CM

## 2016-07-27 DIAGNOSIS — G950 Syringomyelia and syringobulbia: Secondary | ICD-10-CM | POA: Diagnosis not present

## 2016-07-27 MED ORDER — AMPHETAMINE-DEXTROAMPHETAMINE 20 MG PO TABS: 20.0000 mg | ORAL_TABLET | Freq: Every day | ORAL | 0 refills | Status: DC

## 2016-07-27 MED ORDER — METHADONE HCL 10 MG PO TABS: 10.0000 mg | ORAL_TABLET | Freq: Two times a day (BID) | ORAL | 0 refills | Status: DC

## 2016-07-27 NOTE — Patient Instructions (Addendum)
Increase Gabapentin to one capsule  4 times a day Breakfast, Lunch, Supper and Bedtime  Call Office on Monday 08/01/2016        [phone]

## 2016-07-27 NOTE — Progress Notes (Signed)
Subjective:    Patient ID: Derrick Johnson, male    DOB: March 22, 1956, 60 y.o.   MRN: [id number]  HPI:  Derrick Johnson is a 60 year old male who returns for follow up appointment for chronic pain  and medication refill. He states his pain is located in his neck, left shoulder, upper and lower back. Also states he's experiencing increase tingling and numbness in lower extremities. We will increase gabapentin to QID. He was instructed to call office Monday 08/01/2016. He verbalizes understanding.  He rates his pain 6. His current exercise regime is performing Tai Chi, and walking with straight cane.  Pain Inventory Average Pain 6 Pain Right Now 6 My pain is constant, sharp, burning, dull, stabbing, tingling and aching  In the last 24 hours, has pain interfered with the following? General activity 7 Relation with others 7 Enjoyment of life 8 What TIME of day is your pain at its worst? night Sleep (in general) Poor  Pain is worse with: walking, bending, sitting, inactivity, standing and some activites Pain improves with: rest, heat/ice, therapy/exercise, pacing activities and medication Relief from Meds: 5  Mobility walk with assistance use a cane how many minutes can you walk? ? ability to climb steps?  yes do you drive?  yes  Function disabled: date disabled . I need assistance with the following:  meal prep, household duties and shopping  Neuro/Psych weakness numbness tremor tingling spasms dizziness confusion depression anxiety loss of taste or smell  Prior Studies Any changes since last visit?  no  Physicians involved in your care Any changes since last visit?  no   Family History  Problem Relation Age of Onset  . Hyperlipidemia Mother   . Heart disease Father    Social History   Social History  . Marital status: Married    Spouse name: N/A  . Number of children: N/A  . Years of education: N/A   Social History Main Topics  . Smoking status: Never Smoker   . Smokeless tobacco: Never Used  . Alcohol use None  . Drug use: No  . Sexual activity: Not Asked   Other Topics Concern  . None   Social History Narrative  . None   Past Surgical History:  Procedure Laterality Date  . SPINE SURGERY     History reviewed. No pertinent past medical history. BP 122/87 (BP Location: Right Arm, Patient Position: Sitting, Cuff Size: Normal)   Pulse 98   Resp 14   SpO2 95%   Opioid Risk Score:   Fall Risk Score:  `1  Depression screen PHQ 2/9  Depression screen PHQ 2/9 05/02/2016  Decreased Interest 3  Down, Depressed, Hopeless 3  PHQ - 2 Score 6  Altered sleeping 3  Tired, decreased energy 3  Change in appetite 2  Feeling bad or failure about yourself  3  Trouble concentrating 3  Moving slowly or fidgety/restless 3  Suicidal thoughts 3  PHQ-9 Score 26  Difficult doing work/chores Extremely dIfficult   2 Review of Systems  Constitutional: Positive for diaphoresis.  HENT: Negative.   Eyes: Negative.   Respiratory: Negative.   Cardiovascular: Negative.   Gastrointestinal: Negative.   Endocrine: Negative.   Musculoskeletal: Positive for arthralgias, back pain, gait problem, myalgias, neck pain and neck stiffness.  Skin: Positive for rash.  Allergic/Immunologic: Negative.   Neurological: Positive for dizziness, tremors, weakness and numbness.       Tingling  Hematological: Negative.   Psychiatric/Behavioral: Positive for confusion and  dysphoric mood. The patient is nervous/anxious.   All other systems reviewed and are negative.      Objective:   Physical Exam  Constitutional: He is oriented to person, place, and time. He appears well-developed and well-nourished.  HENT:  Head: Normocephalic and atraumatic.  Neck: Normal range of motion. Neck supple.  Cervical Paraspinal Tenderness: C-4- C-6  Cardiovascular: Normal rate and regular rhythm.   Pulmonary/Chest: Effort normal and breath sounds normal.  Musculoskeletal:  Normal  Muscle Bulk and Muscle Testing Reveals: Upper Extremities: Right: Full ROM and Muscle Strength 5/5 Left: Decreased ROM 90 Degrees and Muscle Strength 5/5 Thoracic Paraspinal Tenderness: T-1-T-6 Lumbar Paraspinal Tenderness: L-3-L-5 Lower Extremities: Full ROM and Muscle Strength 5/5 Bilateral Lower Extremities Flexion Produces Pain into Bilateral Hips Arises from Table slowly using straight cane for support Narrow Based Gait    Neurological: He is alert and oriented to person, place, and time.  Skin: Skin is warm and dry.  Psychiatric: He has a normal mood and affect.  Nursing note and vitals reviewed.         Assessment & Plan:  1. Chronic pain syndrome consistent with fibromyalgia: Continue Gabapentin Refilled Methadone 10 mg one tablet BID #60.  We will continue the opioid monitoring program, this consists of regular clinic visits, examinations, urine drug screen, pill counts as well as use of [place] Controlled Substance Reporting System. 2. Peripheral neuropathy: Continue Gabapentin 3. Hx of lumbar spine pain with radiculitis, previous SCS. (still has unit implanted--non-functional). Continue with medication regime and continue to monitor. 4. 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 ADHD: Continue Adderall. Refilled Adderall 20 mg daily #30  5. Hx of left RTC injury and surgery x 3: Continue to Monitor 6. Hypogonadism: Continue Testosterone, check level today.   30 minutes of face to face patient care time was spent during this visit. All questions were encouraged and answered.   Follow up  in one month.   

## 2016-07-28 LAB — TESTOSTERONE: TESTOSTERONE: 337 ng/dL (ref 264–916)

## 2016-08-01 ENCOUNTER — Telehealth: Payer: Self-pay | Admitting: Physical Medicine & Rehabilitation

## 2016-08-01 DIAGNOSIS — G894 Chronic pain syndrome: Secondary | ICD-10-CM

## 2016-08-01 DIAGNOSIS — Z79899 Other long term (current) drug therapy: Secondary | ICD-10-CM

## 2016-08-01 DIAGNOSIS — Z5181 Encounter for therapeutic drug level monitoring: Secondary | ICD-10-CM

## 2016-08-01 DIAGNOSIS — M797 Fibromyalgia: Secondary | ICD-10-CM

## 2016-08-01 DIAGNOSIS — G43009 Migraine without aura, not intractable, without status migrainosus: Secondary | ICD-10-CM

## 2016-08-01 DIAGNOSIS — R5382 Chronic fatigue, unspecified: Secondary | ICD-10-CM

## 2016-08-01 DIAGNOSIS — G950 Syringomyelia and syringobulbia: Secondary | ICD-10-CM

## 2016-08-01 MED ORDER — GABAPENTIN 300 MG PO CAPS: 300.0000 mg | ORAL_CAPSULE | Freq: Four times a day (QID) | ORAL | 3 refills | Status: DC

## 2016-08-01 NOTE — Telephone Encounter (Signed)
Return Mr. Ponzio call, he states today he is feeling better. Gabapentin prescription sent to pharmacy, he verbalizes understanding. Instructed to call with any questions, he verbalizes understanding.

## 2016-08-01 NOTE — Telephone Encounter (Signed)
Patient would like for Zella Ball to call him.  He said the Neurontin has controled his pain, but it is still hard for him to get out of bed.

## 2016-08-02 ENCOUNTER — Telehealth: Payer: Self-pay | Admitting: Registered Nurse

## 2016-08-02 DIAGNOSIS — G43009 Migraine without aura, not intractable, without status migrainosus: Secondary | ICD-10-CM

## 2016-08-02 DIAGNOSIS — R5382 Chronic fatigue, unspecified: Secondary | ICD-10-CM

## 2016-08-02 DIAGNOSIS — Z79899 Other long term (current) drug therapy: Secondary | ICD-10-CM

## 2016-08-02 DIAGNOSIS — Z5181 Encounter for therapeutic drug level monitoring: Secondary | ICD-10-CM

## 2016-08-02 DIAGNOSIS — M797 Fibromyalgia: Secondary | ICD-10-CM

## 2016-08-02 DIAGNOSIS — G894 Chronic pain syndrome: Secondary | ICD-10-CM

## 2016-08-02 DIAGNOSIS — G950 Syringomyelia and syringobulbia: Secondary | ICD-10-CM

## 2016-08-02 MED ORDER — GABAPENTIN 300 MG PO CAPS: 300.0000 mg | ORAL_CAPSULE | Freq: Four times a day (QID) | ORAL | 3 refills | Status: DC

## 2016-08-02 NOTE — Telephone Encounter (Signed)
Prescription re-printed and sent to the pharmacy. Derrick Johnson is aware. Spoke to him on 08/01/2016.

## 2016-08-22 DIAGNOSIS — J019 Acute sinusitis, unspecified: Secondary | ICD-10-CM | POA: Diagnosis not present

## 2016-08-24 ENCOUNTER — Other Ambulatory Visit: Payer: Self-pay | Admitting: *Deleted

## 2016-08-24 MED ORDER — VITAMIN D (ERGOCALCIFEROL) 1.25 MG (50000 UNIT) PO CAPS: 50000.0000 [IU] | ORAL_CAPSULE | ORAL | 3 refills | Status: DC

## 2016-08-31 ENCOUNTER — Encounter: Payer: Medicare Other | Attending: Physical Medicine & Rehabilitation | Admitting: Registered Nurse

## 2016-08-31 ENCOUNTER — Encounter: Payer: Self-pay | Admitting: Registered Nurse

## 2016-08-31 ENCOUNTER — Telehealth: Payer: Self-pay | Admitting: Physical Medicine & Rehabilitation

## 2016-08-31 VITALS — BP 126/84 | HR 85 | Temp 98.3°F | Resp 14

## 2016-08-31 DIAGNOSIS — G894 Chronic pain syndrome: Secondary | ICD-10-CM | POA: Diagnosis not present

## 2016-08-31 DIAGNOSIS — Z5181 Encounter for therapeutic drug level monitoring: Secondary | ICD-10-CM

## 2016-08-31 DIAGNOSIS — G950 Syringomyelia and syringobulbia: Secondary | ICD-10-CM | POA: Diagnosis not present

## 2016-08-31 DIAGNOSIS — M545 Low back pain: Secondary | ICD-10-CM | POA: Insufficient documentation

## 2016-08-31 DIAGNOSIS — E291 Testicular hypofunction: Secondary | ICD-10-CM

## 2016-08-31 DIAGNOSIS — M797 Fibromyalgia: Secondary | ICD-10-CM

## 2016-08-31 DIAGNOSIS — G8929 Other chronic pain: Secondary | ICD-10-CM | POA: Diagnosis not present

## 2016-08-31 DIAGNOSIS — Z79899 Other long term (current) drug therapy: Secondary | ICD-10-CM

## 2016-08-31 DIAGNOSIS — R5382 Chronic fatigue, unspecified: Secondary | ICD-10-CM

## 2016-08-31 MED ORDER — METHADONE HCL 10 MG PO TABS: 10.0000 mg | ORAL_TABLET | Freq: Two times a day (BID) | ORAL | 0 refills | Status: DC

## 2016-08-31 MED ORDER — AMPHETAMINE-DEXTROAMPHETAMINE 20 MG PO TABS: 20.0000 mg | ORAL_TABLET | Freq: Every day | ORAL | 0 refills | Status: DC

## 2016-08-31 MED ORDER — TESTOSTERONE CYPIONATE 200 MG/ML IM SOLN: 100.0000 mg | INTRAMUSCULAR | 0 refills | Status: DC

## 2016-08-31 MED ORDER — TESTOSTERONE CYPIONATE 100 MG/ML IM SOLN: 100.0000 mg | INTRAMUSCULAR | 1 refills | Status: DC

## 2016-08-31 NOTE — Progress Notes (Signed)
Subjective:    Patient ID: Derrick Johnson, male    DOB: 1956/05/12, 60 y.o.   MRN: [id number]  HPI:[name] is a 60 year old male who returns for follow up appointment for chronic pain and medication refill. He states his pain is located in his lower back and bilateral hips. He rates his pain 5. His current exercise regime is performing Tai Chi, and walking with straight cane.  Pain Inventory Average Pain 5 Pain Right Now 5 My pain is constant, sharp, burning, dull, stabbing, tingling and aching  In the last 24 hours, has pain interfered with the following? General activity 6 Relation with others 6 Enjoyment of life 6 What TIME of day is your pain at its worst? "Varies" Sleep (in general) Fair  Pain is worse with: walking, bending, sitting, inactivity, standing and unsure Pain improves with: rest, heat/ice, pacing activities and medication Relief from Meds: 5  Mobility walk without assistance use a cane ability to climb steps?  yes do you drive?  yes transfers alone Do you have any goals in this area?  yes  Function disabled: date disabled 2006 I need assistance with the following:  meal prep, household duties and shopping  Neuro/Psych weakness numbness tremor tingling spasms dizziness confusion anxiety loss of taste or smell  Prior Studies Any changes since last visit?  no  Physicians involved in your care Any changes since last visit?  yes   Family History  Problem Relation Age of Onset  . Hyperlipidemia Mother   . Heart disease Father    Social History   Social History  . Marital status: Married    Spouse name: N/A  . Number of children: N/A  . Years of education: N/A   Social History Main Topics  . Smoking status: Never Smoker  . Smokeless tobacco: Never Used  . Alcohol use None  . Drug use: No  . Sexual activity: Not Asked   Other Topics Concern  . None   Social History Narrative  . None   Past Surgical History:  Procedure  Laterality Date  . SPINE SURGERY     No past medical history on file. BP 126/84   Pulse 85   Temp 98.3 F (36.8 C) (Oral)   Resp 14   SpO2 98%   Opioid Risk Score:   Fall Risk Score:  `1  Depression screen PHQ 2/9  Depression screen PHQ 2/9 05/02/2016  Decreased Interest 3  Down, Depressed, Hopeless 3  PHQ - 2 Score 6  Altered sleeping 3  Tired, decreased energy 3  Change in appetite 2  Feeling bad or failure about yourself  3  Trouble concentrating 3  Moving slowly or fidgety/restless 3  Suicidal thoughts 3  PHQ-9 Score 26  Difficult doing work/chores Extremely dIfficult     Review of Systems  Constitutional: Positive for chills and fever.       Night sweats  HENT: Negative.   Eyes: Negative.   Respiratory: Positive for cough and wheezing.        Flu 08/22/2016  Cardiovascular: Negative.   Gastrointestinal: Negative.   Endocrine: Negative.   Genitourinary: Negative.   Musculoskeletal: Negative.   Skin: Positive for rash.  Allergic/Immunologic: Negative.   Neurological: Negative.   Hematological: Negative.   Psychiatric/Behavioral: Negative.   All other systems reviewed and are negative.      Objective:   Physical Exam  Constitutional: He is oriented to person, place, and time. He appears well-developed and well-nourished.  HENT:  Head: Normocephalic and atraumatic.  Neck: Normal range of motion. Neck supple.  Cardiovascular: Normal rate and regular rhythm.   Pulmonary/Chest: Effort normal and breath sounds normal.  Musculoskeletal:  Normal Muscle Bulk and Muscle Testing Reveals:  Upper Extremities: Full ROM and Muscle Strength 5/5 Lumbar Paraspinal Tenderness: L-3-L-5 Bilateral Greater Trochanteric Tenderness Lower Extremities: Full ROM and Muscle Strength 5/5 Arises from Table slowly using straight cane for support Narrow Based gait  Neurological: He is alert and oriented to person, place, and time.  Skin: Skin is warm and dry.  Psychiatric: He  has a normal mood and affect.  Nursing note and vitals reviewed.         Assessment & Plan:  1. Chronic pain syndrome consistent with fibromyalgia: Continue Gabapentin Refilled Methadone 10 mg one tablet BID #60.  We will continue the opioid monitoring program, this consists of regular clinic visits, examinations, urine drug screen, pill counts as well as use of [place] Controlled Substance Reporting System. 2. Peripheral neuropathy: Continue Gabapentin 3. Hx of lumbar spine pain with radiculitis, previous SCS. (still has unit implanted--non-functional). Continue with medication regime and continue to monitor. 4. 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 ADHD: Continue Adderall. Refilled Adderall 20 mg daily #30  5. Hx of left RTC injury and surgery x 3: Continue to Monitor 6. Hypogonadism: Continue Testosterone, check level today.  30 minutes of face to face patient care time was spent during this visit. All questions were encouraged and answered.  Follow up in one month.

## 2016-08-31 NOTE — Telephone Encounter (Signed)
Testosterone cypionate is only available in 200mg /ml (10 ml bottle) so pt is taking 0.5 ml q 14 days.  Medication order changed to reflect this.

## 2016-08-31 NOTE — Telephone Encounter (Signed)
There has been no change in dosing that i'm aware of. I just compared his 05/06/16 rx to the current and they are the same.

## 2016-08-31 NOTE — Telephone Encounter (Signed)
[facility] called and needs to discuss change in t estosterone dosing  [phone]

## 2016-09-27 ENCOUNTER — Encounter: Payer: Medicare Other | Attending: Physical Medicine & Rehabilitation | Admitting: Registered Nurse

## 2016-09-27 ENCOUNTER — Encounter: Payer: Self-pay | Admitting: Registered Nurse

## 2016-09-27 VITALS — BP 118/87 | HR 100 | Temp 98.6°F | Resp 14

## 2016-09-27 DIAGNOSIS — R5382 Chronic fatigue, unspecified: Secondary | ICD-10-CM

## 2016-09-27 DIAGNOSIS — G8929 Other chronic pain: Secondary | ICD-10-CM | POA: Insufficient documentation

## 2016-09-27 DIAGNOSIS — M545 Low back pain: Secondary | ICD-10-CM | POA: Insufficient documentation

## 2016-09-27 DIAGNOSIS — G950 Syringomyelia and syringobulbia: Secondary | ICD-10-CM | POA: Diagnosis not present

## 2016-09-27 DIAGNOSIS — Z79899 Other long term (current) drug therapy: Secondary | ICD-10-CM

## 2016-09-27 DIAGNOSIS — G894 Chronic pain syndrome: Secondary | ICD-10-CM

## 2016-09-27 DIAGNOSIS — Z5181 Encounter for therapeutic drug level monitoring: Secondary | ICD-10-CM | POA: Diagnosis not present

## 2016-09-27 DIAGNOSIS — E291 Testicular hypofunction: Secondary | ICD-10-CM

## 2016-09-27 DIAGNOSIS — M797 Fibromyalgia: Secondary | ICD-10-CM

## 2016-09-27 MED ORDER — AMPHETAMINE-DEXTROAMPHETAMINE 20 MG PO TABS: 20.0000 mg | ORAL_TABLET | Freq: Every day | ORAL | 0 refills | Status: DC

## 2016-09-27 MED ORDER — METHADONE HCL 10 MG PO TABS: 10.0000 mg | ORAL_TABLET | Freq: Two times a day (BID) | ORAL | 0 refills | Status: DC

## 2016-09-27 NOTE — Progress Notes (Signed)
Subjective:    Patient ID: Derrick Johnson, male    DOB: [birth year], 61 y.o.   MRN: [id number]  HPI: [name] is a 61 year old male who returns for follow up appointment for chronic pain and medication refill. He states his pain is located in his neck radiating into his bilateral shoulders and lower back.He rates his pain 6. His current exercise regime is walking with straight cane.  Mr. Eddinger states he's having difficulty concentrating and asked if his Adderall  could be changed to BID, states he was on this dose in the past. According to [facility] this was in 2016. I spoke with Dr. Naaman Plummer, we will keep the same dose at this time. He has an appointment to see Dr. Naaman Plummer in March and he will re-evaluate at that time. He verbalizes understanding.  Pain Inventory Average Pain 6 Pain Right Now 6 My pain is n/a  In the last 24 hours, has pain interfered with the following? General activity 6 Relation with others 6 Enjoyment of life 6 What TIME of day is your pain at its worst? all Sleep (in general) Poor  Pain is worse with: walking, bending, sitting, standing and some activites Pain improves with: rest, heat/ice, medication, TENS and injections Relief from Meds: 5  Mobility walk without assistance how many minutes can you walk? 2 ability to climb steps?  yes do you drive?  yes transfers alone Do you have any goals in this area?  yes  Function disabled: date disabled 08-11-2003 Do you have any goals in this area?  yes  Neuro/Psych No problems in this area  Prior Studies Any changes since last visit?  no  Physicians involved in your care Any changes since last visit?  no   Family History  Problem Relation Age of Onset  . Hyperlipidemia Mother   . Heart disease Father    Social History   Social History  . Marital status: Married    Spouse name: N/A  . Number of children: N/A  . Years of education: N/A   Social History Main Topics  . Smoking status: Never Smoker   . Smokeless tobacco: Never Used  . Alcohol use None  . Drug use: No  . Sexual activity: Not Asked   Other Topics Concern  . None   Social History Narrative  . None   Past Surgical History:  Procedure Laterality Date  . SPINE SURGERY     No past medical history on file. BP 118/87   Pulse (!) 106   Temp 98.6 F (37 C) (Oral)   Resp 14   SpO2 94%   Opioid Risk Score:   Fall Risk Score:  `1  Depression screen PHQ 2/9  Depression screen PHQ 2/9 05/02/2016  Decreased Interest 3  Down, Depressed, Hopeless 3  PHQ - 2 Score 6  Altered sleeping 3  Tired, decreased energy 3  Change in appetite 2  Feeling bad or failure about yourself  3  Trouble concentrating 3  Moving slowly or fidgety/restless 3  Suicidal thoughts 3  PHQ-9 Score 26  Difficult doing work/chores Extremely dIfficult     Review of Systems  Constitutional: Negative.   HENT: Negative.   Eyes: Negative.   Respiratory: Negative.   Cardiovascular: Negative.   Gastrointestinal: Negative.   Endocrine: Negative.   Genitourinary: Negative.   Musculoskeletal: Negative.   Skin: Negative.   Allergic/Immunologic: Negative.   Neurological: Negative.   Hematological: Negative.   Psychiatric/Behavioral: Negative.  All other systems reviewed and are negative.      Objective:   Physical Exam  Constitutional: He is oriented to person, place, and time. He appears well-developed and well-nourished.  HENT:  Head: Normocephalic and atraumatic.  Neck: Normal range of motion. Neck supple.  Cervical Paraspinal Tenderness: C-5-C-6  Cardiovascular: Normal rate and regular rhythm.   Pulmonary/Chest: Effort normal and breath sounds normal.  Musculoskeletal:  Normal Muscle Bulk and Muscle Testing Reveals: Upper Extremities: Full ROM and Muscle Strength 5/5 Bilateral AC Joint Tenderness Thoracic Paraspinal Tenderness: T-5-T-7 Lumbar Paras(pinal Tenderness: L-3-L-5 Lower Extremities: L-3-L-5 Arises from Table  slowly using straight cane for support Narrow Based Gait    Neurological: He is alert and oriented to person, place, and time.  Skin: Skin is warm and dry.  Psychiatric: He has a normal mood and affect.  Nursing note and vitals reviewed.         Assessment & Plan:  1. Chronic pain syndrome consistent with fibromyalgia: Continue Gabapentin Refilled Methadone 10 mg one tablet BID #60.  We will continue the opioid monitoring program, this consists of regular clinic visits, examinations, urine drug screen, pill counts as well as use of [place] Controlled Substance Reporting System. 2. Peripheral neuropathy: Continue Gabapentin 3. Hx of lumbar spine pain with radiculitis, previous SCS. (still has unit implanted--non-functional). Continue with medication regime and continue to monitor. 4. 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 ADHD: Continue Adderall. Refilled Adderall 20 mg daily #30  5. Hx of left RTC injury and surgery x 3: Continue to Monitor 6. Hypogonadism: Continue Testosterone, check level today.  20 minutes of face to face patient care time was spent during this visit. All questions were encouraged and answered.  Follow up in one month.

## 2016-10-02 LAB — TOXASSURE SELECT,+ANTIDEPR,UR

## 2016-10-07 ENCOUNTER — Telehealth: Payer: Self-pay

## 2016-10-07 NOTE — Telephone Encounter (Signed)
Per NP patient has been notified that his UDS had came back with Oxazepam a metabolite of  Diazepam. Patient was told to taper off medication according to Dr.Swartz note 05/30/2016. Patient states his spasms has gotten worse and he had a few left over from an old Rx. Patient was advised  to notify our office when such situation arises. Patient verbalize understanding.

## 2016-10-07 NOTE — Progress Notes (Signed)
Urine drug screen for this encounter is consistent for prescribed medication but patient took reminisce of an old medication. Patient does have a medication contract with our clinic. And must follow the guidelines of the contract. Patient has been notified and verbalize understanding.

## 2016-10-17 ENCOUNTER — Encounter: Payer: Self-pay | Admitting: Adult Health

## 2016-10-17 ENCOUNTER — Ambulatory Visit (INDEPENDENT_AMBULATORY_CARE_PROVIDER_SITE_OTHER): Payer: Medicare Other | Admitting: Adult Health

## 2016-10-17 VITALS — BP 116/82 | HR 92 | Ht 68.0 in | Wt 206.9 lb

## 2016-10-17 DIAGNOSIS — M797 Fibromyalgia: Secondary | ICD-10-CM | POA: Diagnosis not present

## 2016-10-17 DIAGNOSIS — R946 Abnormal results of thyroid function studies: Secondary | ICD-10-CM | POA: Diagnosis not present

## 2016-10-17 DIAGNOSIS — R5382 Chronic fatigue, unspecified: Secondary | ICD-10-CM | POA: Diagnosis not present

## 2016-10-17 DIAGNOSIS — F909 Attention-deficit hyperactivity disorder, unspecified type: Secondary | ICD-10-CM

## 2016-10-17 DIAGNOSIS — G894 Chronic pain syndrome: Secondary | ICD-10-CM | POA: Diagnosis not present

## 2016-10-17 DIAGNOSIS — Z1321 Encounter for screening for nutritional disorder: Secondary | ICD-10-CM

## 2016-10-17 DIAGNOSIS — Z8349 Family history of other endocrine, nutritional and metabolic diseases: Secondary | ICD-10-CM | POA: Diagnosis not present

## 2016-10-17 DIAGNOSIS — E785 Hyperlipidemia, unspecified: Secondary | ICD-10-CM | POA: Diagnosis not present

## 2016-10-17 DIAGNOSIS — G4701 Insomnia due to medical condition: Secondary | ICD-10-CM

## 2016-10-17 DIAGNOSIS — G4700 Insomnia, unspecified: Secondary | ICD-10-CM | POA: Insufficient documentation

## 2016-10-17 DIAGNOSIS — R7309 Other abnormal glucose: Secondary | ICD-10-CM | POA: Diagnosis not present

## 2016-10-17 NOTE — Assessment & Plan Note (Signed)
Continue excellent water intake.  Eat a well balanced diet.  Practice good sleep hygiene.

## 2016-10-17 NOTE — Progress Notes (Signed)
Subjective:    Patient ID: Derrick Johnson, male    DOB: Nov 03, 1955, 61 y.o.   MRN: [id number]  HPI:  [name] present to establish as a new pt. He has extensive hx of chronic pain, numerous musculoskeletal surgeries, and managed by pain clinic.  He reports taking medication as directed, and denies SE.  He drinks water all day, however still experiences "constant dry mouth".  He reports trying "every alternative pain control" measure and now pain is managed by Dr. Alger Simons with [facility].  He denies acute cardiac sx's.  He denies thoughts of harming himself/others, denies depression.  He lives with his wife and has "an amazing support system of family and friends".     Patient Care Team    Relationship Specialty Notifications Start End  Odella Aquas, NP PCP - General Family Medicine  10/17/16   Meredith Staggers, MD Consulting Physician Physical Medicine and Rehabilitation  10/17/16   Bayard Hugger, NP Nurse Practitioner Physical Medicine and Rehabilitation  10/17/16     Patient Active Problem List   Diagnosis Date Noted  . Insomnia 10/17/2016  . ADHD (attention deficit hyperactivity disorder) 10/17/2016  . Chronic pain syndrome 05/02/2016  . 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 Fatigue 05/02/2016  . Fibromyalgia 05/02/2016     Past Medical History:  Diagnosis Date  . Back contusion   . Chronic migraine   . Chronic pain   . 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 Fibromyalgia   . Stroke [facility])      Past Surgical History:  Procedure Laterality Date  . CRYO INTERCOSTAL NERVE BLOCK    . maxofacial surgery    . ROTATOR CUFF REPAIR Left   . SPINAL CORD STIMULATOR IMPLANT    . SPINE SURGERY    . TMJ ARTHROSCOPY       Family History  Problem Relation Age of Onset  . Hyperlipidemia Mother   . Heart disease Father      History  Drug Use No     History  Alcohol use Not on file     History  Smoking  Status  . Never Smoker  Smokeless Tobacco  . Never Used     Outpatient Encounter Prescriptions as of 10/17/2016  Medication Sig Note  . albuterol (VENTOLIN HFA) 108 (90 Base) MCG/ACT inhaler Inhale into the lungs every 6 (six) hours as needed for wheezing or shortness of breath.   . amphetamine-dextroamphetamine (ADDERALL) 20 MG tablet Take 1 tablet (20 mg total) by mouth daily.   Marland Kitchen docusate sodium (COLACE) 250 MG capsule Take 250 mg by mouth daily.   . fluticasone (FLONASE) 50 MCG/ACT nasal spray  08/31/2016: Received from: External Pharmacy  . gabapentin (NEURONTIN) 300 MG capsule Take 1 capsule (300 mg total) by mouth 4 (four) times daily.   . methadone (DOLOPHINE) 10 MG tablet Take 1 tablet (10 mg total) by mouth 2 (two) times daily.   . SENNOSIDES PO Take 1 tablet by mouth 2 (two) times daily.   . SUMAtriptan (IMITREX) 100 MG tablet Take 1 tablet (100 mg total) by mouth as directed. May repeat in 2 hours if headache persists or recurs.   Marland Kitchen testosterone cypionate (DEPOTESTOSTERONE CYPIONATE) 200 MG/ML injection Inject 0.5 mLs (100 mg total) into the muscle every 14 (fourteen) days.   . traZODone (DESYREL) 150 MG tablet Take 1 tablet by mouth daily. 05/02/2016: Received from: External Pharmacy  .  Vitamin D, Ergocalciferol, (DRISDOL) 50000 units CAPS capsule Take 1 capsule (50,000 Units total) by mouth every 7 (seven) days.   . [DISCONTINUED] doxycycline (VIBRAMYCIN) 100 MG capsule Take 100 mg by mouth. 08/31/2016: Received from: External Pharmacy   No facility-administered encounter medications on file as of 10/17/2016.     Allergies: Penicillins  Body mass index is 31.46 kg/m.  Blood pressure 116/82, pulse 92, height 5\' 8"  (1.727 m), weight 206 lb 14.4 oz (93.8 kg).     Review of Systems  Constitutional: Negative for activity change, appetite change, chills, diaphoresis, fatigue, fever and unexpected weight change.  HENT: Negative for congestion, sinus pain and sinus pressure.    Eyes: Negative for visual disturbance.  Respiratory: Negative for cough, chest tightness and shortness of breath.   Cardiovascular: Negative for chest pain, palpitations and leg swelling.  Gastrointestinal: Negative for abdominal distention, abdominal pain, blood in stool, constipation, diarrhea, nausea and vomiting.  Endocrine: Negative for cold intolerance, heat intolerance, polydipsia, polyphagia and polyuria.  Genitourinary: Negative for difficulty urinating and flank pain.  Musculoskeletal: Positive for arthralgias, back pain, gait problem, joint swelling, myalgias, neck pain and neck stiffness.       Ambulates with cane.  He reports "feeling stable with the cane".  Skin: Negative for color change, pallor, rash and wound.  Neurological: Positive for tremors. Negative for dizziness, syncope and weakness.       Gross tremor of upper extremities that began last year.  Psychiatric/Behavioral: Positive for decreased concentration, sleep disturbance and suicidal ideas. Negative for agitation, behavioral problems, confusion, hallucinations and self-injury. The patient is hyperactive. The patient is not nervous/anxious.        Objective:   Physical Exam  Constitutional: He is oriented to person, place, and time. He appears well-developed and well-nourished. No distress.  HENT:  Head: Normocephalic and atraumatic.  Right Ear: External ear normal.  Left Ear: External ear normal.  Eyes: Conjunctivae are normal. Pupils are equal, round, and reactive to light.  Neck: Normal range of motion. Neck supple. No thyromegaly present.  Cardiovascular: Normal rate, regular rhythm, normal heart sounds and intact distal pulses.   No murmur heard. Pulmonary/Chest: No respiratory distress. He has no wheezes. He exhibits no tenderness.  Abdominal: Soft. Bowel sounds are normal. He exhibits no distension and no mass. There is no tenderness. There is no rebound and no guarding.  Musculoskeletal: He exhibits  tenderness. He exhibits no deformity.       Right shoulder: He exhibits decreased range of motion, tenderness, pain and decreased strength. He exhibits no swelling, no deformity and normal pulse.       Left shoulder: He exhibits decreased range of motion, tenderness, pain and decreased strength. He exhibits no swelling, no deformity and normal pulse.       Cervical back: He exhibits tenderness and pain. He exhibits no swelling, no edema and no deformity.       Thoracic back: He exhibits tenderness, bony tenderness and pain. He exhibits no swelling.       Lumbar back: He exhibits tenderness, bony tenderness and pain.  Physically jumps when touched.  Lymphadenopathy:    He has no cervical adenopathy.  Neurological: He is alert and oriented to person, place, and time. He has normal reflexes. He displays tremor. No cranial nerve deficit. He exhibits abnormal muscle tone. Gait normal.  Gross tremor noted when arms extended out in front of body.  Skin: Skin is warm and dry. No rash noted. He is not diaphoretic.  No erythema. No pallor.  Psychiatric: He has a normal mood and affect. Judgment and thought content normal.  Mildly anxious during encounter.  Nursing note and vitals reviewed.         Assessment & Plan:   1. Chronic pain syndrome   2. Chronic fatigue   3. Fibromyalgia   4. Encounter for vitamin deficiency screening   5. Chronic fatigue   6. Insomnia due to medical condition   7. Attention deficit hyperactivity disorder (ADHD), unspecified ADHD type     Chronic pain syndrome Continue medications and regular f/u with [facility]. Continue ROM exercises as directed by [facility].  Fatigue Continue excellent water intake.  Eat a well balanced diet.  Practice good sleep hygiene.   Fibromyalgia Continue medications and regular f/u with [facility]. Continue ROM exercises as directed by [facility].  Insomnia Continue Trazadone as directed. Practice good sleep  hygiene.   ADHD (attention deficit hyperactivity disorder) Continue with Adderall as directed.  Reduce caffeine intake.    FOLLOW-UP:  Return in about 6 months (around 04/16/2017).

## 2016-10-17 NOTE — Assessment & Plan Note (Signed)
Continue medications and regular f/u with [facility]. Continue ROM exercises as directed by [facility].

## 2016-10-17 NOTE — Assessment & Plan Note (Signed)
Continue Trazadone as directed. Practice good sleep hygiene.

## 2016-10-17 NOTE — Patient Instructions (Signed)
Chronic Pain, Adult Chronic pain is a type of pain that lasts or keeps coming back (recurs) for at least six months. You may have chronic headaches, abdominal pain, or body pain. Chronic pain may be related to an illness, such as fibromyalgia or complex regional pain syndrome. Sometimes the cause of chronic pain is not known. Chronic pain can make it hard for you to do daily activities. If not treated, chronic pain can lead to other health problems, including anxiety and depression. Treatment depends on the cause and severity of your pain. You may need to work with a pain specialist to come up with a treatment plan. The plan may include medicine, counseling, and physical therapy. Many people benefit from a combination of two or more types of treatment to control their pain. Follow these instructions at home: Lifestyle  Consider keeping a pain diary to share with your health care providers.  Consider talking with a mental health care provider (psychologist) about how to cope with chronic pain.  Consider joining a chronic pain support group.  Try to control or lower your stress levels. Talk to your health care provider about strategies to do this. General instructions  Take over-the-counter and prescription medicines only as told by your health care provider.  Follow your treatment plan as told by your health care provider. This may include:  Gentle, regular exercise.  Eating a healthy diet that includes foods such as vegetables, fruits, fish, and lean meats.  Cognitive or behavioral therapy.  Working with a Community education officer.  Meditation or yoga.  Acupuncture or massage therapy.  Aroma, color, light, or sound therapy.  Local electrical stimulation.  Shots (injections) of numbing or pain-relieving medicines into the spine or the area of pain.  Check your pain level as told by your health care provider. Ask your health care provider if you should use a pain scale.  Learn as much  as you can about how to manage your chronic pain. Ask your health care provider if an intensive pain rehabilitation program or a chronic pain specialist would be helpful.  Keep all follow-up visits as told by your health care provider. This is important. Contact a health care provider if:  Your pain gets worse.  You have new pain.  You have trouble sleeping.  You have trouble doing your normal activities.  Your pain is not controlled with treatment.  Your have side effects from pain medicine.  You feel weak. Get help right away if:  You lose feeling or have numbness in your body.  You lose control of bowel or bladder function.  Your pain suddenly gets much worse.  You develop shaking or chills.  You develop confusion.  You develop chest pain.  You have trouble breathing or shortness of breath.  You pass out.  You have thoughts about hurting yourself or others. This information is not intended to replace advice given to you by your health care provider. Make sure you discuss any questions you have with your health care provider. Document Released: 05/14/2002 Document Revised: 04/21/2016 Document Reviewed: 02/09/2016 Elsevier Interactive Patient Education  2017 [employer]    Chronic Fatigue Syndrome Chronic fatigue syndrome (CFS) is a condition that causes extreme tiredness (fatigue). This fatigue does not improve with rest, and it gets worse with physical or mental activity. You may have several other symptoms along with fatigue. Symptoms may come and go, but they generally last for months. Sometimes, CFS gets better over time, but it can be a lifelong  condition. There is no cure, but there are many possible treatments. You will need to work with your health care providers to find a treatment plan that works best for you. What are the causes? The cause of CFS is not known. There may be more than one cause. Possible causes include:  An infection.  An abnormal body  defense system (immune system).  Low blood pressure.  Poor diet.  Physical or emotional stress. What increases the risk? You are more likely to develop this condition if:  You are male.  You are 27?60 years old.  You have a family history of CFS.  You live with a lot of emotional stress. What are the signs or symptoms? The main symptom of CFS is fatigue that is severe enough to interfere with day-to-day activities. This fatigue does not get better with rest, and it gets worse with physical or mental activity. There are eight other major symptoms of CFS:  Lack of energy (malaise) that lasts more than 24 hours after physical exertion.  Sleep that does not relieve fatigue (unrefreshing sleep).  Short-term memory loss or confusion.  Joint pain without redness or swelling.  Muscle aches.  Headaches.  Painful and swollen glands (lymph nodes) in the neck or under the arms.  Sore throat. You may also have:  Abdominal cramps, constipation, or diarrhea (irritable bowel).  Chills.  Night sweats.  Vision changes.  Dizziness.  Mental confusion (brain fog).  Clumsiness.  Sensitivity to food, noise, or odors.  Mood swings, depression, or anxiety attacks. How is this diagnosed? There are no tests that can diagnose this condition. Your health care provider will make the diagnosis based on your medical history, a physical exam, and a mental health exam. However, it is important to make sure that your symptoms are not caused by another medical condition. You may have lab tests or X-rays to rule out other conditions. For your health care provider to diagnose CFS:  You must have had fatigue for at least 6 straight months.  Fatigue must be your first symptom, and it must be severe enough to interfere with day-to-day activities.  There must be no other cause found for the fatigue.  You must also have at least four of the eight other major symptoms of CFS. How is this  treated? There is no cure for CFS. The condition affects everyone differently. You will need to work with your team of health care providers to find the best treatments for your symptoms. Your team may include your primary care provider, physical and exercise therapists, and mental health therapists. Treatment may include:  Improving sleep with a regular bedtime routine.  Avoiding caffeine, alcohol, and tobacco.  Doing light exercise and stretching during the day.  Taking medicines to help you sleep or to relieve joint or muscle pain.  Learning and practicing relaxation techniques.  Using memory aids or doing brainteasers to improve memory and concentration.  Seeing a mental health therapist to evaluate and treat depression, if necessary.  Trying massage therapy, acupuncture, and movement exercises, such as yoga or tai chi. Follow these instructions at home:   Activity  Exercise regularly, as told by your health care provider.  Avoid fatigue by pacing yourself during the day and getting enough sleep at night.  Go to bed and get up at the same time every day. Eating and drinking  Avoid caffeine and alcohol.  Avoid heavy meals in the evening.  Eat a well-balanced diet. General instructions  Take over-the-counter  and prescription medicines only as told by your health care provider.  Do not use herbal or dietary supplements unless they are approved by your health care provider.  Maintain a healthy weight.  Avoid stress and use stress-reducing techniques that you learn in therapy.  Do not use any products that contain nicotine or tobacco, such as cigarettes and e-cigarettes. If you need help quitting, ask your health care provider.  Consider joining a CFS support group.  Keep all follow-up visits as told by your health care provider. This is important. Contact a health care provider if:  Your symptoms do not get better or they get worse.  You feel angry, guilty,  anxious, or depressed. This information is not intended to replace advice given to you by your health care provider. Make sure you discuss any questions you have with your health care provider. Document Released: 09/29/2004 Document Revised: 04/28/2016 Document Reviewed: 11/30/2015 Elsevier Interactive Patient Education  2017 [employer].  Continue medications as directed. Continue with Pain Clinic as directed. Continue excellent hydration.  Perhaps chewing gum will help reduce dry mouth. Will call when labs result. Follow-up every 6 months, or sooner if needed.

## 2016-10-17 NOTE — Assessment & Plan Note (Signed)
Continue with Adderall as directed.  Reduce caffeine intake.

## 2016-10-18 ENCOUNTER — Other Ambulatory Visit: Payer: Self-pay

## 2016-10-18 LAB — COMPREHENSIVE METABOLIC PANEL
A/G RATIO: 1.4 (ref 1.2–2.2)
ALT: 19 IU/L (ref 0–44)
AST: 21 IU/L (ref 0–40)
Albumin: 4.5 g/dL (ref 3.5–5.5)
Alkaline Phosphatase: 77 IU/L (ref 39–117)
BUN/Creatinine Ratio: 6 — ABNORMAL LOW (ref 9–20)
BUN: 6 mg/dL (ref 6–24)
Bilirubin Total: 0.6 mg/dL (ref 0.0–1.2)
CALCIUM: 9.3 mg/dL (ref 8.7–10.2)
CO2: 20 mmol/L (ref 18–29)
Chloride: 98 mmol/L (ref 96–106)
Creatinine, Ser: 0.96 mg/dL (ref 0.76–1.27)
GFR, EST AFRICAN AMERICAN: 100 mL/min/{1.73_m2} (ref >59)
GFR, EST NON AFRICAN AMERICAN: 86 mL/min/{1.73_m2} (ref >59)
GLOBULIN, TOTAL: 3.2 g/dL (ref 1.5–4.5)
Glucose: 106 mg/dL — ABNORMAL HIGH (ref 65–99)
POTASSIUM: 4.8 mmol/L (ref 3.5–5.2)
SODIUM: 141 mmol/L (ref 134–144)
TOTAL PROTEIN: 7.7 g/dL (ref 6.0–8.5)

## 2016-10-18 LAB — CBC WITH DIFFERENTIAL/PLATELET
BASOS: 0 %
Basophils Absolute: 0 10*3/uL (ref 0.0–0.2)
EOS (ABSOLUTE): 0.1 10*3/uL (ref 0.0–0.4)
EOS: 2 %
HEMATOCRIT: 47.5 % (ref 37.5–51.0)
HEMOGLOBIN: 15.6 g/dL (ref 13.0–17.7)
IMMATURE GRANS (ABS): 0 10*3/uL (ref 0.0–0.1)
IMMATURE GRANULOCYTES: 0 %
LYMPHS: 17 %
Lymphocytes Absolute: 1.3 10*3/uL (ref 0.7–3.1)
MCH: 27.2 pg (ref 26.6–33.0)
MCHC: 32.8 g/dL (ref 31.5–35.7)
MCV: 83 fL (ref 79–97)
Monocytes Absolute: 0.7 10*3/uL (ref 0.1–0.9)
Monocytes: 9 %
NEUTROS PCT: 72 %
Neutrophils Absolute: 5.4 10*3/uL (ref 1.4–7.0)
Platelets: 261 10*3/uL (ref 150–379)
RBC: 5.74 x10E6/uL (ref 4.14–5.80)
RDW: 15.2 % (ref 12.3–15.4)
WBC: 7.6 10*3/uL (ref 3.4–10.8)

## 2016-10-18 LAB — LIPID PANEL
CHOLESTEROL TOTAL: 160 mg/dL (ref 100–199)
Chol/HDL Ratio: 4.7 ratio units (ref 0.0–5.0)
HDL: 34 mg/dL — AB (ref >39)
LDL CALC: 99 mg/dL (ref 0–99)
Triglycerides: 135 mg/dL (ref 0–149)
VLDL CHOLESTEROL CAL: 27 mg/dL (ref 5–40)

## 2016-10-18 LAB — HEMOGLOBIN A1C
Est. average glucose Bld gHb Est-mCnc: 108 mg/dL
Hgb A1c MFr Bld: 5.4 % (ref 4.8–5.6)

## 2016-10-18 LAB — TSH: TSH: 0.324 u[IU]/mL — ABNORMAL LOW (ref 0.450–4.500)

## 2016-10-18 LAB — VITAMIN D 25 HYDROXY (VIT D DEFICIENCY, FRACTURES): VIT D 25 HYDROXY: 37.8 ng/mL (ref 30.0–100.0)

## 2016-10-18 NOTE — Progress Notes (Signed)
ERROR

## 2016-10-19 ENCOUNTER — Other Ambulatory Visit: Payer: Self-pay | Admitting: Adult Health

## 2016-10-19 DIAGNOSIS — R7989 Other specified abnormal findings of blood chemistry: Secondary | ICD-10-CM

## 2016-10-19 NOTE — Progress Notes (Signed)
Onset of tremors the last year. Anxiety, emotional lability. Low TSH, elevated free T3, normal free T4. Elevated HR. Highly likely he has Hyperthyroidism-referral to Endocrinology sent.

## 2016-10-20 LAB — T3, FREE: T3 FREE: 4.6 pg/mL — AB (ref 2.0–4.4)

## 2016-10-20 LAB — SPECIMEN STATUS REPORT

## 2016-10-20 LAB — T4, FREE: Free T4: 0.98 ng/dL (ref 0.82–1.77)

## 2016-10-31 ENCOUNTER — Telehealth: Payer: Self-pay | Admitting: Registered Nurse

## 2016-10-31 ENCOUNTER — Encounter: Payer: Medicare Other | Attending: Physical Medicine & Rehabilitation | Admitting: Registered Nurse

## 2016-10-31 ENCOUNTER — Encounter: Payer: Self-pay | Admitting: Registered Nurse

## 2016-10-31 VITALS — BP 136/92 | HR 109 | Resp 16

## 2016-10-31 DIAGNOSIS — M797 Fibromyalgia: Secondary | ICD-10-CM

## 2016-10-31 DIAGNOSIS — M545 Low back pain: Secondary | ICD-10-CM | POA: Diagnosis not present

## 2016-10-31 DIAGNOSIS — E291 Testicular hypofunction: Secondary | ICD-10-CM | POA: Diagnosis not present

## 2016-10-31 DIAGNOSIS — G8929 Other chronic pain: Secondary | ICD-10-CM | POA: Insufficient documentation

## 2016-10-31 DIAGNOSIS — G950 Syringomyelia and syringobulbia: Secondary | ICD-10-CM

## 2016-10-31 DIAGNOSIS — R5382 Chronic fatigue, unspecified: Secondary | ICD-10-CM | POA: Diagnosis not present

## 2016-10-31 DIAGNOSIS — Z5181 Encounter for therapeutic drug level monitoring: Secondary | ICD-10-CM

## 2016-10-31 DIAGNOSIS — Z79899 Other long term (current) drug therapy: Secondary | ICD-10-CM

## 2016-10-31 DIAGNOSIS — G894 Chronic pain syndrome: Secondary | ICD-10-CM

## 2016-10-31 MED ORDER — AMPHETAMINE-DEXTROAMPHETAMINE 20 MG PO TABS: 20.0000 mg | ORAL_TABLET | Freq: Every day | ORAL | 0 refills | Status: DC

## 2016-10-31 MED ORDER — METHADONE HCL 10 MG PO TABS: 10.0000 mg | ORAL_TABLET | Freq: Two times a day (BID) | ORAL | 0 refills | Status: DC

## 2016-10-31 MED ORDER — GABAPENTIN 600 MG PO TABS: 600.0000 mg | ORAL_TABLET | Freq: Three times a day (TID) | ORAL | 3 refills | Status: DC

## 2016-10-31 NOTE — Telephone Encounter (Signed)
On 10/31/2016 the [place] was reviewed no conflict was seen on the [place] with multiple prescribers. Derrick Johnson has a signed narcotic contract with our office. If there were any discrepancies this would have been reported to his physician.

## 2016-10-31 NOTE — Patient Instructions (Signed)
Increase Gabapentin to 600 mg three times a day.  Use your currently Gabapentin of 300 mg two capsules three times a day   Call office in a week to evaluate   [phone]

## 2016-10-31 NOTE — Progress Notes (Signed)
Subjective:    Patient ID: Derrick Johnson, male    DOB: Jan 16, 1956, 61 y.o.   MRN: [id number]  HPI:  [name] is a 61 year old male who returns for follow up appointment for chronic pain and medication refill. He states his pain is located in his neck radiating into his bilateral shoulders and upper- lower back.He rates his pain 7. His current exercise regime is walking with straight cane. Also states his pain has increased in intensity over the last two months and his pain is not being controlled. He has been bed bound he states due to the pain.   Will increase Gabapentin to 600 MG, he verbalizes understanding. He would like to speak to Dr. Naaman Plummer, and see Dr. Naaman Plummer for the next few months appointments will be scheduled with Dr. Naaman Plummer per patient's request.   Pain Inventory Average Pain 6 Pain Right Now 7 My pain is sharp, burning, dull, stabbing, tingling and aching  In the last 24 hours, has pain interfered with the following? General activity 8 Relation with others 8 Enjoyment of life 8 What TIME of day is your pain at its worst? evening and night Sleep (in general) Poor  Pain is worse with: walking, bending, sitting, standing and some activites Pain improves with: heat/ice and medication Relief from Meds: 3  Mobility walk without assistance walk with assistance use a cane ability to climb steps?  yes do you drive?  yes  Function disabled: date disabled .  Neuro/Psych weakness numbness tremor tingling spasms confusion anxiety  Prior Studies Any changes since last visit?  no  Physicians involved in your care Any changes since last visit?  no   Family History  Problem Relation Age of Onset  . Hyperlipidemia Mother   . Heart disease Father    Social History   Social History  . Marital status: Married    Spouse name: N/A  . Number of children: N/A  . Years of education: N/A   Social History Main Topics  . Smoking status: Never Smoker  .  Smokeless tobacco: Never Used  . Alcohol use None  . Drug use: No  . Sexual activity: Not Asked   Other Topics Concern  . None   Social History Narrative  . None   Past Surgical History:  Procedure Laterality Date  . CRYO INTERCOSTAL NERVE BLOCK    . maxofacial surgery    . ROTATOR CUFF REPAIR Left   . SPINAL CORD STIMULATOR IMPLANT    . SPINE SURGERY    . TMJ ARTHROSCOPY     Past Medical History:  Diagnosis Date  . Back contusion   . Chronic migraine   . Chronic pain   . 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 Fibromyalgia   . Stroke ([place])    BP (!) 136/92   Pulse (!) 109   Resp 16   SpO2 97%   Opioid Risk Score:   Fall Risk Score:  `1  Depression screen PHQ 2/9  Depression screen [facility] 2/9 10/31/2016 05/02/2016  Decreased Interest 0 3  Down, Depressed, Hopeless 0 3  PHQ - 2 Score 0 6  Altered sleeping - 3  Tired, decreased energy - 3  Change in appetite - 2  Feeling bad or failure about yourself  - 3  Trouble concentrating - 3  Moving slowly or fidgety/restless - 3  Suicidal thoughts - 3  PHQ-9 Score -  26  Difficult doing work/chores - Extremely dIfficult    Review of Systems  Constitutional: Positive for chills, diaphoresis and fever.  HENT: Negative.   Eyes: Negative.   Respiratory: Negative.   Cardiovascular: Negative.   Gastrointestinal: Negative.   Endocrine: Negative.   Genitourinary: Negative.   Musculoskeletal:       Spasms  Skin: Positive for rash.  Allergic/Immunologic: Negative.   Neurological: Positive for tremors, weakness and numbness.  Hematological: Negative.   Psychiatric/Behavioral: Positive for confusion. The patient is nervous/anxious.   All other systems reviewed and are negative.      Objective:   Physical Exam  Constitutional: He is oriented to person, place, and time. He appears well-developed and well-nourished.  HENT:  Head: Normocephalic and atraumatic.    Neck: Normal range of motion. Neck supple.  Cervical Paraspinal Tenderness: C-5-C-6   Cardiovascular: Normal rate and regular rhythm.   Pulmonary/Chest: Effort normal and breath sounds normal.  Musculoskeletal:  Normal Muscle Bulk and Muscle Testing Reveals: Upper Extremities: Full ROM and Muscle Strength 5/5 Bilateral AC Joint Tenderness Thoracic Paraspinal Tenderness: T-1-T-3 Lumbar Hypersensitivity Lower Extremities: Full ROM and Muscle Strength 5/5 Arises from chair slowly using straight cane for support Antalgic Gait   Neurological: He is alert and oriented to person, place, and time.  Skin: Skin is warm and dry.  Psychiatric: He has a normal mood and affect.  Nursing note and vitals reviewed.         Assessment & Plan:  1. Chronic pain syndrome consistent with fibromyalgia: 10/31/2016 Continue Gabapentin Refilled Methadone 10 mg one tablet BID #60.  We will continue the opioid monitoring program, this consists of regular clinic visits, examinations, urine drug screen, pill counts as well as use of [place] Controlled Substance Reporting System. 2. Peripheral neuropathy: Continue Gabapentin. 10/31/2016 3. Hx of lumbar spine pain with radiculitis, previous SCS. (still has unit implanted--non-functional). Continue with medication regime and continue to monitor. 10/31/2016 4. 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 10/31/2016 5. ADHD: Refilled Adderall 20 mg daily #30  5. Hx of left RTC injury and surgery x 3: Continue to Monitor. 10/31/2016 6. Hypogonadism: Continue Testosterone, check level today. 10/31/2016  12minutes of face to face patient care time was spent during this visit. All questions were encouraged and answered.   Follow up in one month with Dr. Naaman Plummer

## 2016-11-08 ENCOUNTER — Telehealth: Payer: Self-pay | Admitting: *Deleted

## 2016-11-08 NOTE — Telephone Encounter (Signed)
Patient left a message asking for a call back from [place] regarding last weeks appointment and medication dosage

## 2016-11-08 NOTE — Telephone Encounter (Signed)
Return Mr. Uyehara call, he states the increase in Gabapentin has improved his arm pain.

## 2016-11-13 NOTE — Progress Notes (Signed)
Patient ID: Derrick Johnson, male   DOB: 01-09-1956, 61 y.o.   MRN: [id number]                                                                                                                Reason for Appointment:  Hyperthyroidism, new consultation  Referring physician: Arvil Persons  Chief complaint: Tiredness   History of Present Illness:   The patient has had long-standing fatigue with his multiple medical problems and chronic pain  He apparently had a routine TSH tested with his new physician in 2/18 and this was below normal  He thinks that he has had occasional feelings of palpitations but cannot pinpoint when this started Also for about 6 months he has noticed some shakiness of his hands He complains of increased appetite and is appearing to be gaining weight At night he may feel hot at times but also sometimes gets cold, no consistent heat intolerance He is not very physically active because of his pain   Wt Readings from Last 3 Encounters:  11/14/16 209 lb (94.8 kg)  10/17/16 206 lb 14.4 oz (93.8 kg)  05/30/16 180 lb (81.6 kg)     Thyroid function tests as follows:     Lab Results  Component Value Date   FREET4 0.98 10/17/2016   FREET4 1.00 05/02/2016   T3FREE 4.6 (H) 10/17/2016   T3FREE 3.1 05/02/2016   TSH 0.324 (L) 10/17/2016   TSH 1.280 05/02/2016    No results found for: THYROTRECAB    The patient apparently had a thyroid ultrasound in 2003 because of some other radiological studies showing thyroid enlargement but this showed only tiny thyroid nodules. He does not feel any difficulty swallowing usually, may occasionally feel a little discomfort in his throat area No choking sensations    Allergies as of 11/14/2016      Reactions   Penicillins    Other reaction(s): Unknown      Medication List       Accurate as of 11/14/16 11:06 AM. Always use your most recent med list.          amphetamine-dextroamphetamine 20 MG tablet Commonly known as:   ADDERALL Take 1 tablet (20 mg total) by mouth daily.   docusate sodium 250 MG capsule Commonly known as:  COLACE Take 250 mg by mouth daily.   fluticasone 50 MCG/ACT nasal spray Commonly known as:  FLONASE   gabapentin 300 MG capsule Commonly known as:  NEURONTIN   gabapentin 600 MG tablet Commonly known as:  NEURONTIN Take 1 tablet (600 mg total) by mouth 3 (three) times daily.   methadone 10 MG tablet Commonly known as:  DOLOPHINE Take 1 tablet (10 mg total) by mouth 2 (two) times daily.   SENNOSIDES PO Take 1 tablet by mouth 2 (two) times daily.   SUMAtriptan 100 MG tablet Commonly known as:  IMITREX Take 1 tablet (100 mg total) by mouth as directed. May repeat in 2 hours if headache persists or recurs.   testosterone cypionate 200  MG/ML injection Commonly known as:  DEPOTESTOSTERONE CYPIONATE Inject 0.5 mLs (100 mg total) into the muscle every 14 (fourteen) days.   traZODone 150 MG tablet Commonly known as:  DESYREL Take 1 tablet by mouth daily.   VENTOLIN HFA 108 (90 Base) MCG/ACT inhaler Generic drug:  albuterol Inhale into the lungs every 6 (six) hours as needed for wheezing or shortness of breath.   Vitamin D (Ergocalciferol) 50000 units Caps capsule Commonly known as:  DRISDOL Take 1 capsule (50,000 Units total) by mouth every 7 (seven) days.           Past Medical History:  Diagnosis Date  . Back contusion   . Chronic migraine   . Chronic pain   . 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 Fibromyalgia   . Stroke [facility])     Past Surgical History:  Procedure Laterality Date  . CRYO INTERCOSTAL NERVE BLOCK    . maxofacial surgery    . ROTATOR CUFF REPAIR Left   . SPINAL CORD STIMULATOR IMPLANT    . SPINE SURGERY    . TMJ ARTHROSCOPY      Family History  Problem Relation Age of Onset  . Hyperlipidemia Mother   . Heart disease Father     Social History:  reports that he has never  smoked. He has never used smokeless tobacco. He reports that he does not use drugs. His alcohol history is not on file.  Allergies:  Allergies  Allergen Reactions  . Penicillins     Other reaction(s): Unknown    Review of Systems:  Review of Systems  Constitutional: Negative for reduced appetite.       Increased appetite present for several months and also weight gain  HENT: Negative for trouble swallowing.        Has dry mouth  Eyes: Positive for blurred vision.       He has difficulty with blurring at times, has not seen an ophthalmologist  Cardiovascular: Positive for palpitations.  Endocrine: Positive for fatigue, heat intolerance and decreased libido.       He feels hot mostly at night. He has low testosterone levels, being treated by his rehabilitation physician with testosterone injections.  Does not think he feels any better with this and no increased energy level with this  Genitourinary: Negative for frequency.  Musculoskeletal: Positive for back pain.  Skin: Negative for rash.  Neurological: Positive for tremors.       6 months      Examination:   BP 112/84   Pulse (!) 102   Ht 5\' 8"  (1.727 m)   Wt 209 lb (94.8 kg)   SpO2 98%   BMI 31.78 kg/m    General Appearance:  well-built and nourished, pleasant, not anxious or hyperkinetic.        Eyes: No unusual prominence, lid lag or stare. No swelling of the eyelids  Neck: The thyroid is enlarged 1.5-2 times normal on the right side only and firm, slightly nodular Left side not palpable There is no lymphadenopathy .          Heart: normal S1 and S2, no murmurs .          Lungs: breath sounds are clear bilaterally Abdomen: no hepatosplenomegaly or other palpable abnormality  Extremities: Normal and temperature. No ankle edema. Neurological: Deep tendon reflexes at biceps are brisk. Bilateral  tremors are present, mildly coarse. Skin: No rash, abnormal thickening  of the skin on legs or pigmentation seen      Assessment/Plan:   Hyperthyroidism, mild and currently etiology unclear  He has had nonspecific symptoms and the only specific signs of hyperthyroidism appear to be his sinus tachycardia and tremor  He does not have any specific signs or symptoms of Graves' disease. He also has a small right-sided thyroid enlargement, this may possibly indicate a toxic nodular goiter  His TSH is however not completely suppressed and not clear why. Currently only his free T3 level is above normal, free T4 is normal  Recommendations:  Will repeat TSH and free T3 levels to confirm hyperthyroidism  Check thyrotropin receptor antibody and if negative consider a thyroid scan  Discussed in general the causes of hyperthyroidism and options for treatment with antithyroid drugs and I-131   Patient understands the above discussion. All questions were answered satisfactorily  Treatment plan to be made after labs are available  Consult note sent to referring physician  [facility] 11/14/2016, 11:06 AM

## 2016-11-14 ENCOUNTER — Encounter: Payer: Self-pay | Admitting: Endocrinology

## 2016-11-14 ENCOUNTER — Ambulatory Visit (INDEPENDENT_AMBULATORY_CARE_PROVIDER_SITE_OTHER): Payer: Medicare Other | Admitting: Endocrinology

## 2016-11-14 VITALS — BP 112/84 | HR 102 | Ht 68.0 in | Wt 209.0 lb

## 2016-11-14 DIAGNOSIS — E0590 Thyrotoxicosis, unspecified without thyrotoxic crisis or storm: Secondary | ICD-10-CM | POA: Diagnosis not present

## 2016-11-15 ENCOUNTER — Encounter: Payer: Self-pay | Admitting: Endocrinology

## 2016-11-15 LAB — THYROTROPIN RECEPTOR AUTOABS: Thyrotropin Receptor Ab: 0.51 IU/L (ref 0.00–1.75)

## 2016-11-15 LAB — TSH: TSH: 0.72 u[IU]/mL (ref 0.35–4.50)

## 2016-11-15 LAB — T3, FREE: T3, Free: 4.1 pg/mL (ref 2.3–4.2)

## 2016-11-15 NOTE — Telephone Encounter (Signed)
Patient has question about message on my chart.  Please advise

## 2016-11-18 ENCOUNTER — Ambulatory Visit: Payer: Medicare Other | Admitting: Family Medicine

## 2016-11-23 ENCOUNTER — Encounter: Payer: Medicare Other | Attending: Physical Medicine & Rehabilitation | Admitting: Physical Medicine & Rehabilitation

## 2016-11-23 ENCOUNTER — Encounter: Payer: Self-pay | Admitting: Physical Medicine & Rehabilitation

## 2016-11-23 VITALS — BP 119/87 | HR 87

## 2016-11-23 DIAGNOSIS — R5382 Chronic fatigue, unspecified: Secondary | ICD-10-CM

## 2016-11-23 DIAGNOSIS — M797 Fibromyalgia: Secondary | ICD-10-CM

## 2016-11-23 DIAGNOSIS — Z5181 Encounter for therapeutic drug level monitoring: Secondary | ICD-10-CM | POA: Diagnosis not present

## 2016-11-23 DIAGNOSIS — M545 Low back pain: Secondary | ICD-10-CM | POA: Insufficient documentation

## 2016-11-23 DIAGNOSIS — G894 Chronic pain syndrome: Secondary | ICD-10-CM | POA: Diagnosis not present

## 2016-11-23 DIAGNOSIS — G950 Syringomyelia and syringobulbia: Secondary | ICD-10-CM | POA: Diagnosis not present

## 2016-11-23 DIAGNOSIS — G8929 Other chronic pain: Secondary | ICD-10-CM | POA: Diagnosis not present

## 2016-11-23 DIAGNOSIS — Z79899 Other long term (current) drug therapy: Secondary | ICD-10-CM

## 2016-11-23 MED ORDER — LEVOTHYROXINE SODIUM 25 MCG PO CAPS: 25.0000 ug | ORAL_CAPSULE | Freq: Every day | ORAL | 3 refills | Status: DC

## 2016-11-23 MED ORDER — METHADONE HCL 10 MG PO TABS: 10.0000 mg | ORAL_TABLET | Freq: Two times a day (BID) | ORAL | 0 refills | Status: DC

## 2016-11-23 MED ORDER — AMPHETAMINE-DEXTROAMPHETAMINE 20 MG PO TABS: 20.0000 mg | ORAL_TABLET | Freq: Every day | ORAL | 0 refills | Status: DC

## 2016-11-23 NOTE — Patient Instructions (Signed)
FATIGUE PLAN: 1. WEEK ONE--REDUCE GABAPENTIN TO 300MG  2. WEEK TWO-BEGIN THE LEVOTHYROXINE 3. WEEK 3+ OBSERVE 4. CHECK LABS NEXT TIME.

## 2016-11-23 NOTE — Progress Notes (Signed)
Subjective:    Patient ID: Derrick Johnson, male    DOB: 25-Jul-1956, 61 y.o.   MRN: [id number]  HPI   Derrick Johnson is here in follow up of his chronic pain. He reports in an increase in pain over the last few months. Derrick Johnson increased his gabapentin to address neuropathic symptoms but he feels that the change has "weakened his core", caused sexual dysfunction. He's had low energy and struggles to get out of bed. This concerns him as he has "prided" himself in someone who could get up and move despite having pain. He denies feeling depressed but acknowledges that his mood is a factor in his pain. He states he's been on antidepressants before but they "didn't do anyting"  He has seen endocrinology and has borderline low testosterone, D, and thyroid levels (which we already knew). No changes to his regimen were recommended.   He continues on his methadone for pain control as well as adderall. He uses imitrex for breakthrough headaches.       Pain Inventory Average Pain 5 Pain Right Now 5 My pain is constant, sharp, burning, dull, tingling and aching  In the last 24 hours, has pain interfered with the following? General activity 7 Relation with others 7 Enjoyment of life 7 What TIME of day is your pain at its worst? all Sleep (in general) Poor  Pain is worse with: walking, bending, sitting, standing, unsure and some activites Pain improves with: rest, heat/ice and medication Relief from Meds: 5  Mobility walk without assistance use a cane ability to climb steps?  yes do you drive?  yes  Function disabled: date disabled .  Neuro/Psych bladder control problems weakness numbness tremor tingling spasms dizziness confusion anxiety  Prior Studies Any changes since last visit?  no  Physicians involved in your care Any changes since last visit?  no   Family History  Problem Relation Age of Onset  . Hyperlipidemia Mother   . Heart disease Father   . Thyroid disease  Neg Hx    Social History   Social History  . Marital status: Married    Spouse name: N/A  . Number of children: N/A  . Years of education: N/A   Social History Main Topics  . Smoking status: Never Smoker  . Smokeless tobacco: Never Used  . Alcohol use Not on file  . Drug use: No  . Sexual activity: Not on file   Other Topics Concern  . Not on file   Social History Narrative  . No narrative on file   Past Surgical History:  Procedure Laterality Date  . CRYO INTERCOSTAL NERVE BLOCK    . maxofacial surgery    . ROTATOR CUFF REPAIR Left   . SPINAL CORD STIMULATOR IMPLANT    . SPINE SURGERY    . TMJ ARTHROSCOPY     Past Medical History:  Diagnosis Date  . Back contusion   . Chronic migraine   . Chronic pain   . 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 Fibromyalgia   . Stroke [facility])    There were no vitals taken for this visit.  Opioid Risk Score:   Fall Risk Score:  `1  Depression screen PHQ 2/9  Depression screen [facility] 2/9 10/31/2016 05/02/2016  Decreased Interest 0 3  Down, Depressed, Hopeless 0 3  PHQ - 2 Score 0 6  Altered sleeping - 3  Tired, decreased energy -  3  Change in appetite - 2  Feeling bad or failure about yourself  - 3  Trouble concentrating - 3  Moving slowly or fidgety/restless - 3  Suicidal thoughts - 3  PHQ-9 Score - 26  Difficult doing work/chores - Extremely dIfficult    Review of Systems  Constitutional: Positive for diaphoresis, fatigue and fever.  HENT: Negative.   Eyes: Negative.   Respiratory: Positive for shortness of breath and wheezing.   Cardiovascular: Negative.   Gastrointestinal: Negative.   Endocrine: Negative.   Genitourinary: Positive for dysuria.  Musculoskeletal: Negative.   Skin: Positive for rash.  Allergic/Immunologic: Negative.   Neurological: Negative.   Hematological: Negative.   Psychiatric/Behavioral: Negative.   All other systems reviewed and  are negative.      Objective:   Physical Exam  General: Alert and oriented x 3, No apparent distress. Slightly frail appearing for age HEENT:Head is normocephalic, atraumatic, PERRLA, EOMI, sclera anicteric, oral mucosa pink and moist, dentition intact, ext ear canals clear,  Neck:Supple without JVD or lymphadenopathy Heart:RRR Chest:CTA B Abdomen:Soft, non-tender, non-distended, bowel sounds positive. Extremities:No clubbing, cyanosis, or edema. Pulses are 2+ Skin:Clean and intact without signs of breakdown. He has surgical scars on the thoracic and lumbar spine.  Neuro:cognitively appropriate for most part. Does have difficulties with attention and focus at times and will jump from topic to topic. Motor and sensory exam grossly intact.  Musculoskeletal:stands with a humped posture and has difficulty coming to an erect, neutral position. Back and pelvis TTP. Has numerous other tender areas on limbs.  Psych:Pt is very pleasant although sometimes anxious       Assessment & Plan:  1. Chronic pain syndrome consistent with fibromyalgia--pt has had NUMEROUS providers and diagnoses over the years 2. Peripheral neuropathy of unclear origin 3. Hx of lumbar spine pain with radiculitis, previous SCS.(still has unit implanted--non-functional) 4. 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 Hx of left RTC injury and surgery x 3    Plan:  1. Testosterone and vitamin D supplement should continue.Will check levels next month.    -also would recommend low dose thyroid supplementation (even though he's low normal)---start with 55mcg levothyroxine daily. I have seen this improve energy and pain levels  -recheck thyroid level next month.  - Refilled adderall 20mg  daily #30.   -consider mood related causes of fatigue.  2. Reduce gabapentin to 300mg  TID to help with sedation/mood. 3. Refilled methadone and imitrex today.     We will continue the opioid  monitoring program, this consists of regular clinic visits, examinations, urine drug screen, pill counts as well as use of [place] Controlled Substance Reporting System. [place] was reviewed today.   -UDS today 4. Consider MRI of lumbar spine and cervical spine will likely be in order to discern the extent of disease in these areas.  5. 15 minutes of face to face patient care time were spent during this visit. All questions were encouraged and answered. Follow up is in one month. Greater than 50% of time during this encounter was spent counseling patient/family in regard to endocrine dysfunction as it pertains to pain, mood, general treatment considerations.

## 2016-11-26 LAB — TOXASSURE SELECT,+ANTIDEPR,UR

## 2016-12-15 ENCOUNTER — Telehealth: Payer: Self-pay | Admitting: Registered Nurse

## 2016-12-15 NOTE — Telephone Encounter (Signed)
Mr. Musich had a UDS performed on 11/23/2016, it was consistent.

## 2016-12-26 ENCOUNTER — Ambulatory Visit: Payer: Medicare Other | Admitting: Endocrinology

## 2016-12-26 ENCOUNTER — Encounter: Payer: Medicare Other | Attending: Physical Medicine & Rehabilitation | Admitting: Physical Medicine & Rehabilitation

## 2016-12-26 ENCOUNTER — Encounter: Payer: Self-pay | Admitting: Physical Medicine & Rehabilitation

## 2016-12-26 VITALS — BP 130/88 | HR 94 | Resp 14

## 2016-12-26 DIAGNOSIS — Z79899 Other long term (current) drug therapy: Secondary | ICD-10-CM

## 2016-12-26 DIAGNOSIS — G894 Chronic pain syndrome: Secondary | ICD-10-CM | POA: Insufficient documentation

## 2016-12-26 DIAGNOSIS — E559 Vitamin D deficiency, unspecified: Secondary | ICD-10-CM | POA: Diagnosis not present

## 2016-12-26 DIAGNOSIS — M797 Fibromyalgia: Secondary | ICD-10-CM

## 2016-12-26 DIAGNOSIS — E349 Endocrine disorder, unspecified: Secondary | ICD-10-CM | POA: Diagnosis not present

## 2016-12-26 DIAGNOSIS — G8929 Other chronic pain: Secondary | ICD-10-CM | POA: Insufficient documentation

## 2016-12-26 DIAGNOSIS — R5382 Chronic fatigue, unspecified: Secondary | ICD-10-CM | POA: Diagnosis not present

## 2016-12-26 DIAGNOSIS — Z5181 Encounter for therapeutic drug level monitoring: Secondary | ICD-10-CM | POA: Diagnosis not present

## 2016-12-26 DIAGNOSIS — E038 Other specified hypothyroidism: Secondary | ICD-10-CM | POA: Diagnosis not present

## 2016-12-26 DIAGNOSIS — M545 Low back pain: Secondary | ICD-10-CM | POA: Insufficient documentation

## 2016-12-26 DIAGNOSIS — G950 Syringomyelia and syringobulbia: Secondary | ICD-10-CM

## 2016-12-26 MED ORDER — AMPHETAMINE-DEXTROAMPHETAMINE 20 MG PO TABS: 20.0000 mg | ORAL_TABLET | Freq: Every day | ORAL | 0 refills | Status: DC

## 2016-12-26 MED ORDER — METHADONE HCL 10 MG PO TABS: 10.0000 mg | ORAL_TABLET | Freq: Two times a day (BID) | ORAL | 0 refills | Status: DC

## 2016-12-26 NOTE — Progress Notes (Signed)
Subjective:    Patient ID: VONTAE COURT, male    DOB: 1956-02-09, 61 y.o.   MRN: [id number]  HPI  Mr. Fredericks is here in follow up of his chronic pain. He has been doing better with the decreased dose gabapentin. He feels that the synthroid might have helped also. He has been more active and often feels the results of activity which manifest in pain afterwards.   He continues on the methadone for pain control. He is taking adderall for ADHD.     Pain Inventory Average Pain 7 Pain Right Now 7 My pain is constant, sharp, burning, dull, stabbing, tingling and aching  In the last 24 hours, has pain interfered with the following? General activity 6 Relation with others 6 Enjoyment of life 6 What TIME of day is your pain at its worst? all Sleep (in general) Poor  Pain is worse with: walking, bending, sitting, inactivity and some activites Pain improves with: rest, heat/ice, therapy/exercise, pacing activities and medication Relief from Meds: 4  Mobility walk without assistance walk with assistance use a cane use a walker ability to climb steps?  yes do you drive?  yes Do you have any goals in this area?  yes  Function disabled: date disabled 08/11/2003 I need assistance with the following:  meal prep, household duties and shopping Do you have any goals in this area?  yes  Neuro/Psych weakness numbness tremor tingling trouble walking spasms  Prior Studies Any changes since last visit?  no  Physicians involved in your care Any changes since last visit?  no   Family History  Problem Relation Age of Onset  . Hyperlipidemia Mother   . Heart disease Father   . Thyroid disease Neg Hx    Social History   Social History  . Marital status: Married    Spouse name: N/A  . Number of children: N/A  . Years of education: N/A   Social History Main Topics  . Smoking status: Never Smoker  . Smokeless tobacco: Never Used  . Alcohol use None  . Drug use: No  . Sexual  activity: Not Asked   Other Topics Concern  . None   Social History Narrative  . None   Past Surgical History:  Procedure Laterality Date  . CRYO INTERCOSTAL NERVE BLOCK    . maxofacial surgery    . ROTATOR CUFF REPAIR Left   . SPINAL CORD STIMULATOR IMPLANT    . SPINE SURGERY    . TMJ ARTHROSCOPY     Past Medical History:  Diagnosis Date  . Back contusion   . Chronic migraine   . Chronic pain   . 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 Fibromyalgia   . Stroke ([place])    BP 130/88 (BP Location: Right Arm, Patient Position: Sitting, Cuff Size: Normal)   Pulse 94   Resp 14   SpO2 98%   Opioid Risk Score:   Fall Risk Score:  `1  Depression screen PHQ 2/9  Depression screen [facility] 2/9 10/31/2016 05/02/2016  Decreased Interest 0 3  Down, Depressed, Hopeless 0 3  PHQ - 2 Score 0 6  Altered sleeping - 3  Tired, decreased energy - 3  Change in appetite - 2  Feeling bad or failure about yourself  - 3  Trouble concentrating - 3  Moving slowly or fidgety/restless - 3  Suicidal thoughts - 3  PHQ-9 Score - 26  Difficult doing work/chores - Extremely dIfficult    Review of Systems  HENT: Negative.   Eyes: Negative.   Respiratory: Positive for shortness of breath and wheezing.   Cardiovascular: Negative.   Gastrointestinal: Negative.   Endocrine: Negative.   Genitourinary: Negative.   Musculoskeletal: Positive for arthralgias, back pain, gait problem, myalgias and neck pain.       Spasms   Skin: Negative.   Allergic/Immunologic: Negative.   Neurological: Positive for tremors, weakness and numbness.       Tingling   All other systems reviewed and are negative.      Objective:   Physical Exam General: no distress HEENT:Head is normocephalic, atraumatic, PERRLA, EOMI, sclera anicteric, oral mucosa pink and moist, dentition intact, ext ear canals clear,  Neck:Supple without JVD or  lymphadenopathy Heart:RRR Chest:CTA B Abdomen:Soft, non-tender, non-distended, bowel sounds positive. Extremities:Pulses 2+ Skin:Clean and intact without signs of breakdown. He has surgical scars on the thoracic and lumbar spine.  Neuro:cognitively appropriate for most part. Does have difficulties with attention and focus at times and will jump from topic to topic. Motor and sensory exam grossly intact.  Musculoskeletal:stands with a humped posture and has difficulty coming to an erect, neutral position. Back and pelvis TTP. Has numerous other tender areas on limbs.  Psych: a little anxious/pleasant       Assessment & Plan:  1. Chronic pain syndrome consistent with fibromyalgia--pt has had NUMEROUS providers and diagnoses over the years 2. Peripheral neuropathy of unclear origin 3. Hx of lumbar spine pain with radiculitis, previous SCS.(still has unit implanted--non-functional) 4. 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 Hx of left RTC injury and surgery x 3    Plan:  1. Testosterone and vitamin D supplement should continue.  -check testosterone and vitamin D levels.              -check thyroid studies today             -continue thyroid supplementation at same dose for now pending results             -Refilled adderall 20mg  daily #30.              -consider mood related causes of fatigue.  2. Gabapentin 300mg , will go ahead and stop. 3. Refilled methadone and imitrex today.                -We will continue the opioid monitoring program, this consists of regular clinic visits, examinations, urine drug screen, pill counts as well as use of [place] Controlled Substance Reporting System. [place] was reviewed today.  4. Still can consider MRI of lumbar spine and cervical spine will likely be in order to discern the extent of disease in these areas.  5. 15 minutes of face to face patient care time were spent during this visit. All  questions were encouraged and answered. Follow up is in one month. Greater than 50% of time during this encounter was spent counseling patient/family in regard to endocrine work up/supplementation/exercise regimen.

## 2016-12-26 NOTE — Patient Instructions (Signed)
PLEASE FEEL FREE TO CALL OUR OFFICE WITH ANY PROBLEMS OR QUESTIONS ([phone])      

## 2016-12-30 LAB — VITAMIN D 1,25 DIHYDROXY
VITAMIN D 1, 25 (OH) TOTAL: 38 pg/mL
Vitamin D3 1, 25 (OH)2: 32 pg/mL

## 2016-12-30 LAB — T4: T4, Total: 7.6 ug/dL (ref 4.5–12.0)

## 2016-12-30 LAB — TESTOSTERONE, FREE: TESTOSTERONE FREE: 10.1 pg/mL (ref 6.6–18.1)

## 2016-12-30 LAB — T4, FREE: Free T4: 1.09 ng/dL (ref 0.82–1.77)

## 2016-12-30 LAB — T3: T3 TOTAL: 155 ng/dL (ref 71–180)

## 2016-12-30 LAB — TESTOSTERONE: TESTOSTERONE: 718 ng/dL (ref 264–916)

## 2016-12-30 LAB — T3, FREE: T3, Free: 4.2 pg/mL (ref 2.0–4.4)

## 2017-01-02 DIAGNOSIS — E785 Hyperlipidemia, unspecified: Secondary | ICD-10-CM | POA: Insufficient documentation

## 2017-01-05 ENCOUNTER — Telehealth: Payer: Self-pay

## 2017-01-05 NOTE — Telephone Encounter (Signed)
Patient called and left voicemail asking results of his lab work from 12-26-16, please advise

## 2017-01-06 NOTE — Telephone Encounter (Signed)
notified

## 2017-01-06 NOTE — Telephone Encounter (Signed)
All labwork within normal range. I would not make any changes. Continue vitamin d capsule and other meds as he's doing

## 2017-01-25 ENCOUNTER — Encounter: Payer: Self-pay | Admitting: Physical Medicine & Rehabilitation

## 2017-01-25 ENCOUNTER — Encounter: Payer: Medicare Other | Attending: Physical Medicine & Rehabilitation | Admitting: Physical Medicine & Rehabilitation

## 2017-01-25 VITALS — BP 135/88 | HR 90 | Resp 14

## 2017-01-25 DIAGNOSIS — Z5181 Encounter for therapeutic drug level monitoring: Secondary | ICD-10-CM

## 2017-01-25 DIAGNOSIS — M797 Fibromyalgia: Secondary | ICD-10-CM | POA: Diagnosis not present

## 2017-01-25 DIAGNOSIS — M545 Low back pain: Secondary | ICD-10-CM | POA: Insufficient documentation

## 2017-01-25 DIAGNOSIS — Z79899 Other long term (current) drug therapy: Secondary | ICD-10-CM | POA: Diagnosis not present

## 2017-01-25 DIAGNOSIS — F909 Attention-deficit hyperactivity disorder, unspecified type: Secondary | ICD-10-CM | POA: Diagnosis not present

## 2017-01-25 DIAGNOSIS — G8929 Other chronic pain: Secondary | ICD-10-CM | POA: Insufficient documentation

## 2017-01-25 DIAGNOSIS — G950 Syringomyelia and syringobulbia: Secondary | ICD-10-CM

## 2017-01-25 DIAGNOSIS — R5382 Chronic fatigue, unspecified: Secondary | ICD-10-CM | POA: Diagnosis not present

## 2017-01-25 DIAGNOSIS — G894 Chronic pain syndrome: Secondary | ICD-10-CM | POA: Insufficient documentation

## 2017-01-25 MED ORDER — METHADONE HCL 10 MG PO TABS: 10.0000 mg | ORAL_TABLET | Freq: Two times a day (BID) | ORAL | 0 refills | Status: DC

## 2017-01-25 MED ORDER — AMPHETAMINE-DEXTROAMPHETAMINE 20 MG PO TABS: 20.0000 mg | ORAL_TABLET | Freq: Every day | ORAL | 0 refills | Status: DC

## 2017-01-25 NOTE — Progress Notes (Signed)
Subjective:    Patient ID: Derrick Johnson, male    DOB: 1956/03/03, 61 y.o.   MRN: [id number]  HPI   Derrick Johnson is here in follow up of his chronic pain. He has been a little more sore after going in the ocean a couple weeks back. After being jostled around a bit, he felt sore from head to toe. He did enjoy being at the beach  Overall he feels that his mood and energy are better. He thinks the doses of endocrine supplements he's taking are helping.   He remains on methadone for pain control with reasonable results. He is moving his bowels.    Pain Inventory Average Pain 7 Pain Right Now 6 My pain is constant, sharp, burning, dull, stabbing, tingling and aching  In the last 24 hours, has pain interfered with the following? General activity 7 Relation with others 6 Enjoyment of life 6 What TIME of day is your pain at its worst? all Sleep (in general) poor-fair  Pain is worse with: walking, bending, sitting, inactivity, standing, unsure and some activites Pain improves with: rest, heat/ice, therapy/exercise, pacing activities and medication Relief from Meds: 5  Mobility walk without assistance walk with assistance use a cane use a walker ability to climb steps?  yes do you drive?  yes Do you have any goals in this area?  yes  Function disabled: date disabled . I need assistance with the following:  meal prep, household duties and shopping  Neuro/Psych numbness tremor tingling trouble walking spasms dizziness confusion  Prior Studies Any changes since last visit?  no  Physicians involved in your care Any changes since last visit?  no   Family History  Problem Relation Age of Onset  . Hyperlipidemia Mother   . Heart disease Father   . Thyroid disease Neg Hx    Social History   Social History  . Marital status: Married    Spouse name: N/A  . Number of children: N/A  . Years of education: N/A   Social History Main Topics  . Smoking status: Never Smoker    . Smokeless tobacco: Never Used  . Alcohol use None  . Drug use: No  . Sexual activity: Not Asked   Other Topics Concern  . None   Social History Narrative  . None   Past Surgical History:  Procedure Laterality Date  . CRYO INTERCOSTAL NERVE BLOCK    . maxofacial surgery    . ROTATOR CUFF REPAIR Left   . SPINAL CORD STIMULATOR IMPLANT    . SPINE SURGERY    . TMJ ARTHROSCOPY     Past Medical History:  Diagnosis Date  . Back contusion   . Chronic migraine   . Chronic pain   . 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 Fibromyalgia   . Stroke ([place])    BP 135/88 (BP Location: Left Arm, Patient Position: Sitting, Cuff Size: Normal)   Pulse 90   Resp 14   SpO2 95%   Opioid Risk Score:   Fall Risk Score:  `1  Depression screen PHQ 2/9  Depression screen [facility] 2/9 10/31/2016 05/02/2016  Decreased Interest 0 3  Down, Depressed, Hopeless 0 3  PHQ - 2 Score 0 6  Altered sleeping - 3  Tired, decreased energy - 3  Change in appetite - 2  Feeling bad or failure about yourself  - 3  Trouble concentrating - 3  Moving slowly or fidgety/restless - 3  Suicidal thoughts - 3  PHQ-9 Score - 26  Difficult doing work/chores - Extremely dIfficult    Review of Systems  HENT: Negative.   Eyes: Negative.   Respiratory: Negative.   Cardiovascular: Negative.   Gastrointestinal: Negative.   Endocrine: Negative.   Genitourinary: Negative.   Musculoskeletal: Positive for arthralgias, back pain, gait problem, myalgias, neck pain and neck stiffness.  Skin: Positive for rash.  Allergic/Immunologic: Negative.   Neurological: Positive for dizziness, tremors, weakness and numbness.       Tingling   Hematological: Negative.   Psychiatric/Behavioral: Positive for confusion and decreased concentration.  All other systems reviewed and are negative.      Objective:   Physical Exam  General: no distress HEENT:Head is  normocephalic, atraumatic, PERRLA, EOMI, sclera anicteric, oral mucosa pink and moist, dentition intact, ext ear canals clear,  Neck:Supple without JVD or lymphadenopathy Heart:RRR Chest:CTA B Abdomen:Soft, non-tender, non-distended, bowel sounds positive. Extremities:Pulses 2+ Skin:Clean and intact without signs of breakdown. He has surgical scars on the thoracic and lumbar spine.  Neuro:attention fair. Sometimes needs redirection..  Musculoskeletal:stands with a humped posture. Can bend to 75 degrees and has difficulty coming to neutral afterwards.  Back TTP Psych: pleasant       Assessment & Plan:  1. Chronic pain syndrome consistent with fibromyalgia--pt has had NUMEROUS providers and diagnoses over the years 2. Peripheral neuropathy of unclear origin 3. Hx of lumbar spine pain with radiculitis, previous SCS.(still has unit implanted--non-functional) 4. 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 Hx of left RTC injury and surgery x 3    Plan:  1. Endocrine:   -Testosterone and vitamin D supplement should continue.             -continue testosterone and vitamin D supps.     -continue thyroid supplementation at same dose. Don't see utility in pushing much further.  -Refilled adderall 20mg  daily #30.  -consider mood related causes of fatigue.  2.  Reviewed HEP. Provided pilates stretches today. . 3. Refilled methadone and imitrex today.  -We will continue the opioid monitoring program, this consists of regular clinic visits, examinations, urine drug screen, pill counts as well as use of [place] Controlled Substance Reporting System. [place]CC[place] was reviewed today.   -UDS today 4. Still can consider MRI of lumbar spine and cervical spine if warranted 5.71minutes of face to face patient care time were spent during this visit. All questions were encouraged and answered. Follow  up is in one month. Greater than 50% of time during this encounter was spent counseling patient/family in regard to endocrine work up/supplementation/exercise regimen.

## 2017-01-25 NOTE — Patient Instructions (Signed)
PLEASE FEEL FREE TO CALL OUR OFFICE WITH ANY PROBLEMS OR QUESTIONS ([phone])      

## 2017-01-29 LAB — TOXASSURE SELECT,+ANTIDEPR,UR

## 2017-01-31 ENCOUNTER — Telehealth: Payer: Self-pay | Admitting: *Deleted

## 2017-01-31 NOTE — Telephone Encounter (Signed)
Urine drug screen for this encounter is consistent for prescribed medication 

## 2017-02-22 ENCOUNTER — Encounter: Payer: Medicare Other | Admitting: Physical Medicine & Rehabilitation

## 2017-03-28 ENCOUNTER — Encounter: Payer: Self-pay | Admitting: Physical Medicine & Rehabilitation

## 2017-03-28 ENCOUNTER — Encounter: Payer: Medicare Other | Attending: Physical Medicine & Rehabilitation | Admitting: Physical Medicine & Rehabilitation

## 2017-03-28 VITALS — BP 132/86 | HR 74

## 2017-03-28 DIAGNOSIS — M797 Fibromyalgia: Secondary | ICD-10-CM

## 2017-03-28 DIAGNOSIS — J45909 Unspecified asthma, uncomplicated: Secondary | ICD-10-CM | POA: Diagnosis not present

## 2017-03-28 DIAGNOSIS — Z5181 Encounter for therapeutic drug level monitoring: Secondary | ICD-10-CM

## 2017-03-28 DIAGNOSIS — M545 Low back pain: Secondary | ICD-10-CM | POA: Insufficient documentation

## 2017-03-28 DIAGNOSIS — G950 Syringomyelia and syringobulbia: Secondary | ICD-10-CM | POA: Diagnosis not present

## 2017-03-28 DIAGNOSIS — R5382 Chronic fatigue, unspecified: Secondary | ICD-10-CM | POA: Diagnosis not present

## 2017-03-28 DIAGNOSIS — G894 Chronic pain syndrome: Secondary | ICD-10-CM | POA: Diagnosis not present

## 2017-03-28 DIAGNOSIS — G8929 Other chronic pain: Secondary | ICD-10-CM | POA: Insufficient documentation

## 2017-03-28 DIAGNOSIS — Z79899 Other long term (current) drug therapy: Secondary | ICD-10-CM

## 2017-03-28 DIAGNOSIS — F908 Attention-deficit hyperactivity disorder, other type: Secondary | ICD-10-CM

## 2017-03-28 MED ORDER — LEVOTHYROXINE SODIUM 25 MCG PO CAPS: 25.0000 ug | ORAL_CAPSULE | Freq: Every day | ORAL | 3 refills | Status: DC

## 2017-03-28 MED ORDER — METHADONE HCL 10 MG PO TABS: 10.0000 mg | ORAL_TABLET | Freq: Two times a day (BID) | ORAL | 0 refills | Status: DC

## 2017-03-28 MED ORDER — ALBUTEROL SULFATE HFA 108 (90 BASE) MCG/ACT IN AERS: 1.0000 | INHALATION_SPRAY | Freq: Four times a day (QID) | RESPIRATORY_TRACT | 5 refills | Status: AC | PRN

## 2017-03-28 MED ORDER — TRAZODONE HCL 150 MG PO TABS: 150.0000 mg | ORAL_TABLET | Freq: Every day | ORAL | 4 refills | Status: DC

## 2017-03-28 MED ORDER — AMPHETAMINE-DEXTROAMPHETAMINE 20 MG PO TABS: 20.0000 mg | ORAL_TABLET | Freq: Every day | ORAL | 0 refills | Status: DC

## 2017-03-28 MED ORDER — SUMATRIPTAN SUCCINATE 100 MG PO TABS: 100.0000 mg | ORAL_TABLET | ORAL | 3 refills | Status: DC

## 2017-03-28 NOTE — Patient Instructions (Signed)
PLEASE FEEL FREE TO CALL OUR OFFICE WITH ANY PROBLEMS OR QUESTIONS ([phone])      

## 2017-03-28 NOTE — Progress Notes (Signed)
Subjective:    Patient ID: Derrick Johnson, male    DOB: 06/07/1956, 61 y.o.   MRN: [id number]  HPI   Mr. Ardito is here in follow up of his chronic pain. He has been doing well from an activity standpoint. He has been doing more activity outside the home and has more energy. He attributes it to synthroid and testosterone. He is having some headaches and struggles with allergies. He does feel that on day where he's much more active that his pain levels can be intolerable.   Pain Inventory Average Pain 6 Pain Right Now 5 My pain is constant, sharp, burning, dull, stabbing and tingling  In the last 24 hours, has pain interfered with the following? General activity 7 Relation with others 5 Enjoyment of life 6 What TIME of day is your pain at its worst? all Sleep (in general) Fair  Pain is worse with: walking, bending, sitting, inactivity, standing and some activites Pain improves with: rest, heat/ice, therapy/exercise, pacing activities, medication, TENS and injections Relief from Meds: 6  Mobility walk without assistance walk with assistance use a cane use a walker ability to climb steps?  yes do you drive?  yes  Function disabled: date disabled 08/2003 I need assistance with the following:  meal prep, household duties and shopping  Neuro/Psych weakness numbness tremor tingling trouble walking spasms confusion anxiety  Prior Studies Any changes since last visit?  no  Physicians involved in your care Any changes since last visit?  no   Family History  Problem Relation Age of Onset  . Hyperlipidemia Mother   . Heart disease Father   . Thyroid disease Neg Hx    Social History   Social History  . Marital status: Married    Spouse name: N/A  . Number of children: N/A  . Years of education: N/A   Social History Main Topics  . Smoking status: Never Smoker  . Smokeless tobacco: Never Used  . Alcohol use Not on file  . Drug use: No  . Sexual activity: Not on  file   Other Topics Concern  . Not on file   Social History Narrative  . No narrative on file   Past Surgical History:  Procedure Laterality Date  . CRYO INTERCOSTAL NERVE BLOCK    . maxofacial surgery    . ROTATOR CUFF REPAIR Left   . SPINAL CORD STIMULATOR IMPLANT    . SPINE SURGERY    . TMJ ARTHROSCOPY     Past Medical History:  Diagnosis Date  . Back contusion   . Chronic migraine   . Chronic pain   . 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 Fibromyalgia   . Stroke ([place])    BP 132/86   Pulse 74   SpO2 95%   Opioid Risk Score:  10 Fall Risk Score:  `1  Depression screen PHQ 2/9  Depression screen [facility] 2/9 03/28/2017 10/31/2016 05/02/2016  Decreased Interest 0 0 3  Down, Depressed, Hopeless 0 0 3  PHQ - 2 Score 0 0 6  Altered sleeping - - 3  Tired, decreased energy - - 3  Change in appetite - - 2  Feeling bad or failure about yourself  - - 3  Trouble concentrating - - 3  Moving slowly or fidgety/restless - - 3  Suicidal thoughts - - 3  PHQ-9 Score - - 26  Difficult doing work/chores - - Extremely dIfficult  Review of Systems  HENT: Negative.   Eyes: Negative.   Respiratory: Negative.   Cardiovascular: Negative.   Gastrointestinal: Negative.   Endocrine: Negative.   Genitourinary: Negative.   Musculoskeletal:       Spasms  Skin: Positive for rash.  Allergic/Immunologic: Negative.   Neurological: Positive for tremors, weakness and numbness.       Tingling  Hematological: Negative.   Psychiatric/Behavioral: Positive for confusion. The patient is nervous/anxious.   All other systems reviewed and are negative.      Objective:   Physical Exam General: no distress HEENT:Head is normocephalic, atraumatic, PERRLA, EOMI, sclera anicteric, oral mucosa pink and moist, dentition intact, ext ear canals clear,  Neck:Supple without JVD or lymphadenopathy Heart:RRR Chest:CTA B Abdomen:Soft,  non-tender, non-distended, bowel sounds positive. Extremities:Pulses 2+ Skin:Clean and intact without signs of breakdown. He has surgical scars on the thoracic and lumbar spine.  Neuro:attention fair. Sometimes needs redirection..  Musculoskeletal:flexed posture. Can bend to 70-75  degrees with support. Needs extra time to come back to an "erect" position. .  Back TTP Psych:pleasant and cooperative       Assessment & Plan:  1. Chronic pain syndrome consistent with fibromyalgia--pt has had NUMEROUS providers and diagnoses over the years 2. Peripheral neuropathy of unclear origin 3. Hx of lumbar spine pain with radiculitis, previous SCS.(still has unit implanted--non-functional) 4. 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 Hx of left RTC injury and surgery x 3    Plan:  1. Endocrine:              -Testosterone and vitamin D supplement should continue. -continue testosterone and vitamin D supps.    -continue thyroid supplementation at same dose. Last level 1.10 (free Tr4) in April---recheck level in October or November -Refilled adderall 20mg  daily #30.  -consider mood related causes of fatigue.  2.  Reviewed HEP. Provided pilates stretches today. . 3. Refilled methadone and imitrex today.   -will give him 6 more methadone per month to use when pain worsens, #66 total  -We will continue the opioid monitoring program, this consists of regular clinic visits, examinations, urine drug screen, pill counts as well as use of [place] Controlled Substance Reporting System. [place] was reviewed today.          4. Still can consider MRI of lumbar spine and cervical spine if pain worsens.  5.3minutes of face to face patient care time were spent during this visit. All questions were encouraged and answered. Follow up is in one month with NP

## 2017-04-25 ENCOUNTER — Ambulatory Visit: Payer: Medicare Other | Admitting: Physical Medicine & Rehabilitation

## 2017-04-26 ENCOUNTER — Encounter: Payer: Medicare Other | Attending: Physical Medicine & Rehabilitation | Admitting: Physical Medicine & Rehabilitation

## 2017-04-26 ENCOUNTER — Encounter: Payer: Self-pay | Admitting: Physical Medicine & Rehabilitation

## 2017-04-26 VITALS — BP 133/86 | HR 84

## 2017-04-26 DIAGNOSIS — M545 Low back pain: Secondary | ICD-10-CM | POA: Insufficient documentation

## 2017-04-26 DIAGNOSIS — G8929 Other chronic pain: Secondary | ICD-10-CM | POA: Insufficient documentation

## 2017-04-26 DIAGNOSIS — M797 Fibromyalgia: Secondary | ICD-10-CM

## 2017-04-26 DIAGNOSIS — Z79899 Other long term (current) drug therapy: Secondary | ICD-10-CM | POA: Diagnosis not present

## 2017-04-26 DIAGNOSIS — F909 Attention-deficit hyperactivity disorder, unspecified type: Secondary | ICD-10-CM | POA: Diagnosis not present

## 2017-04-26 DIAGNOSIS — G950 Syringomyelia and syringobulbia: Secondary | ICD-10-CM

## 2017-04-26 DIAGNOSIS — R5382 Chronic fatigue, unspecified: Secondary | ICD-10-CM | POA: Diagnosis not present

## 2017-04-26 DIAGNOSIS — Z5181 Encounter for therapeutic drug level monitoring: Secondary | ICD-10-CM

## 2017-04-26 DIAGNOSIS — G894 Chronic pain syndrome: Secondary | ICD-10-CM

## 2017-04-26 MED ORDER — AMPHETAMINE-DEXTROAMPHETAMINE 20 MG PO TABS: 20.0000 mg | ORAL_TABLET | Freq: Every day | ORAL | 0 refills | Status: DC

## 2017-04-26 MED ORDER — METHADONE HCL 10 MG PO TABS: 10.0000 mg | ORAL_TABLET | Freq: Two times a day (BID) | ORAL | 0 refills | Status: DC

## 2017-04-26 NOTE — Patient Instructions (Signed)
PLEASE FEEL FREE TO CALL OUR OFFICE WITH ANY PROBLEMS OR QUESTIONS ([phone])      

## 2017-04-26 NOTE — Progress Notes (Signed)
Subjective:    Patient ID: Derrick Johnson, male    DOB: 1956/02/11, 61 y.o.   MRN: [id number]  HPI   [name] is here in follow up of his chronic pain. He has been able to do more around the house. He is feeling better overall. When he is more active he tends to hurt more however. The extra few methadone seem to help him. He is moving his bowels and bladder. Sleep is reasonable  His mood has been better overall.    Pain Inventory Average Pain 5 Pain Right Now 5 My pain is sharp, burning, dull, stabbing, tingling and aching  In the last 24 hours, has pain interfered with the following? General activity 7 Relation with others 7 Enjoyment of life 7 What TIME of day is your pain at its worst? . Sleep (in general) Fair  Pain is worse with: walking, bending, sitting, inactivity, standing, unsure and some activites Pain improves with: rest, heat/ice, therapy/exercise, pacing activities, medication and TENS Relief from Meds: 5  Mobility walk without assistance walk with assistance use a cane use a walker ability to climb steps?  yes do you drive?  yes  Function disabled: date disabled 2004 I need assistance with the following:  meal prep, household duties and shopping  Neuro/Psych weakness numbness tremor tingling trouble walking spasms dizziness anxiety loss of taste or smell  Prior Studies Any changes since last visit?  no  Physicians involved in your care Any changes since last visit?  no   Family History  Problem Relation Age of Onset  . Hyperlipidemia Mother   . Heart disease Father   . Thyroid disease Neg Hx    Social History   Social History  . Marital status: Married    Spouse name: N/A  . Number of children: N/A  . Years of education: N/A   Social History Main Topics  . Smoking status: Never Smoker  . Smokeless tobacco: Never Used  . Alcohol use Not on file  . Drug use: No  . Sexual activity: Not on file   Other Topics Concern  . Not on  file   Social History Narrative  . No narrative on file   Past Surgical History:  Procedure Laterality Date  . CRYO INTERCOSTAL NERVE BLOCK    . maxofacial surgery    . ROTATOR CUFF REPAIR Left   . SPINAL CORD STIMULATOR IMPLANT    . SPINE SURGERY    . TMJ ARTHROSCOPY     Past Medical History:  Diagnosis Date  . Back contusion   . Chronic migraine   . Chronic pain   . 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 Fibromyalgia   . Stroke [facility])    There were no vitals taken for this visit.  Opioid Risk Score:   Fall Risk Score:  `1  Depression screen PHQ 2/9  Depression screen [facility] 2/9 03/28/2017 10/31/2016 05/02/2016  Decreased Interest 0 0 3  Down, Depressed, Hopeless 0 0 3  PHQ - 2 Score 0 0 6  Altered sleeping - - 3  Tired, decreased energy - - 3  Change in appetite - - 2  Feeling bad or failure about yourself  - - 3  Trouble concentrating - - 3  Moving slowly or fidgety/restless - - 3  Suicidal thoughts - - 3  PHQ-9 Score - - 26  Difficult doing work/chores - - Extremely dIfficult  Review of Systems  Constitutional: Negative.   HENT: Negative.   Eyes: Negative.   Respiratory: Positive for shortness of breath and wheezing.   Cardiovascular: Negative.   Gastrointestinal: Negative.   Endocrine: Negative.   Genitourinary: Negative.   Musculoskeletal: Negative.   Skin: Negative.   Allergic/Immunologic: Negative.   Neurological: Negative.   Hematological: Negative.   All other systems reviewed and are negative.      Objective:   Physical Exam  General: no distress HEENT:Head is normocephalic, atraumatic, PERRLA, EOMI, sclera anicteric, oral mucosa pink and moist, dentition intact, ext ear canals clear,  Neck:Supple without JVD or lymphadenopathy Heart: RRR Chest:CTA B Abdomen:Soft, non-tender, non-distended, bowel sounds positive. Extremities:Pulses 2+ Skin:Clean and intact without signs  of breakdown. He has surgical scars on the thoracic and lumbar spine.  Neuro:attention fair. Sometimes needs redirection..  Musculoskeletal:flexed while standing at 75 degrees. Back TTP Psych:pleasant and cooperative       Assessment & Plan:  1. Chronic pain syndrome consistent with fibromyalgia--pt has had NUMEROUS providers and diagnoses over the years 2. Peripheral neuropathy of unclear origin 3. Hx of lumbar spine pain with radiculitis, previous SCS.(still has unit implanted--non-functional) 4. 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 Hx of left RTC injury and surgery x 3    Plan:  1. Endocrine:  -Testosterone and vitamin D supplement should continue. -continuetestosterone and vitamin D supps.  - thyroid supplementation at same dose. I believe this has helped his energy levels   -Last level 1.10 (free Tr4) in April---recheck level in October or November -Refilled adderall 20mg  daily #30.   .  2. Reviewed HEP. Continue with appropriate stretches and techniques . 3. Refilled methadone and imitrex today.              -methadone refilled, #66 total--may continue  -We will continue the opioid monitoring program, this consists of regular clinic visits, examinations, urine drug screen, pill counts as well as use of [place] Controlled Substance Reporting System. [place] was reviewed today.      4. Still can consider MRI of lumbar spine and cervical spine if pain worsens.  5. 15 minutes of face to face patient care time were spent during this visit. All questions were encouraged and answered. Follow up is in 2 months with me or NP

## 2017-05-30 ENCOUNTER — Ambulatory Visit: Payer: Medicare Other | Admitting: Registered Nurse

## 2017-06-28 ENCOUNTER — Encounter: Payer: Medicare Other | Attending: Physical Medicine & Rehabilitation | Admitting: Registered Nurse

## 2017-06-28 ENCOUNTER — Encounter: Payer: Self-pay | Admitting: Registered Nurse

## 2017-06-28 ENCOUNTER — Telehealth: Payer: Self-pay | Admitting: Registered Nurse

## 2017-06-28 VITALS — BP 153/82 | HR 88

## 2017-06-28 DIAGNOSIS — Z5181 Encounter for therapeutic drug level monitoring: Secondary | ICD-10-CM

## 2017-06-28 DIAGNOSIS — G894 Chronic pain syndrome: Secondary | ICD-10-CM | POA: Insufficient documentation

## 2017-06-28 DIAGNOSIS — G8929 Other chronic pain: Secondary | ICD-10-CM | POA: Diagnosis not present

## 2017-06-28 DIAGNOSIS — F909 Attention-deficit hyperactivity disorder, unspecified type: Secondary | ICD-10-CM

## 2017-06-28 DIAGNOSIS — G950 Syringomyelia and syringobulbia: Secondary | ICD-10-CM | POA: Diagnosis not present

## 2017-06-28 DIAGNOSIS — G43009 Migraine without aura, not intractable, without status migrainosus: Secondary | ICD-10-CM | POA: Diagnosis not present

## 2017-06-28 DIAGNOSIS — M545 Low back pain: Secondary | ICD-10-CM | POA: Diagnosis not present

## 2017-06-28 DIAGNOSIS — Z79899 Other long term (current) drug therapy: Secondary | ICD-10-CM | POA: Diagnosis not present

## 2017-06-28 DIAGNOSIS — E349 Endocrine disorder, unspecified: Secondary | ICD-10-CM | POA: Diagnosis not present

## 2017-06-28 DIAGNOSIS — M797 Fibromyalgia: Secondary | ICD-10-CM

## 2017-06-28 DIAGNOSIS — E038 Other specified hypothyroidism: Secondary | ICD-10-CM | POA: Diagnosis not present

## 2017-06-28 DIAGNOSIS — R5382 Chronic fatigue, unspecified: Secondary | ICD-10-CM | POA: Diagnosis not present

## 2017-06-28 MED ORDER — AMPHETAMINE-DEXTROAMPHETAMINE 20 MG PO TABS: 20.0000 mg | ORAL_TABLET | Freq: Every day | ORAL | 0 refills | Status: DC

## 2017-06-28 MED ORDER — METHADONE HCL 10 MG PO TABS: 10.0000 mg | ORAL_TABLET | Freq: Two times a day (BID) | ORAL | 0 refills | Status: DC

## 2017-06-28 NOTE — Telephone Encounter (Signed)
On 06/28/2017 the [place] was reviewed no conflict was seen on the [place] with multiple prescribers. Derrick Johnson has a signed narcotic contract with our office. If there were any discrepancies this would have been reported to his physician.

## 2017-06-28 NOTE — Progress Notes (Signed)
Subjective:    Patient ID: Derrick Johnson, male    DOB: 05-Jan-1956, 61 y.o.   MRN: [id number]  HPI:  Derrick Johnson is a 61 year old male who returns for follow up appointment for chronic pain and medication refill. He states his pain is located in his neck radiating into his bilateral shoulders and upper- lower back, bilateral hips and bilateral knees.He rates his pain 8. His current exercise regime is walking with straight cane.  Also reports his pain is not being controlled with current medication regimen and asked if we could change his analgesics. I explained in detail to Derrick Johnson we could began weaning his methadone as we introduce hydrocodone and this provider will speak with Dr. Naaman Plummer regarding the above, he verbalizes understanding. As I attempted to proceed with the plan he has decided to wait until next month when he can speak with Dr. Eda Keys himself, he realizes he will continue on the current medication regimen with no changes. He verbalizes understanding.    Derrick Johnson Morphine equivalent is 116.47 MME.    Pain Inventory Average Pain 7 Pain Right Now 8 My pain is constant, sharp, burning, dull, stabbing, tingling and aching  In the last 24 hours, has pain interfered with the following? General activity 8 Relation with others 7 Enjoyment of life 8 What TIME of day is your pain at its worst? all, worst evening Sleep (in general) Poor  Pain is worse with: walking, bending, sitting, standing and some activites Pain improves with: heat/ice, therapy/exercise, pacing activities and medication Relief from Meds: 4  Mobility walk without assistance walk with assistance use a cane ability to climb steps?  yes do you drive?  yes  Function disabled: date disabled 08/11/2003 I need assistance with the following:  meal prep, household duties and shopping  Neuro/Psych numbness tingling spasms  Prior Studies Any changes since last visit?  no  Physicians involved in your  care Any changes since last visit?  no   Family History  Problem Relation Age of Onset  . Hyperlipidemia Mother   . Heart disease Father   . Thyroid disease Neg Hx    Social History   Social History  . Marital status: Married    Spouse name: N/A  . Number of children: N/A  . Years of education: N/A   Social History Main Topics  . Smoking status: Never Smoker  . Smokeless tobacco: Never Used  . Alcohol use None  . Drug use: No  . Sexual activity: Not Asked   Other Topics Concern  . None   Social History Narrative  . None   Past Surgical History:  Procedure Laterality Date  . CRYO INTERCOSTAL NERVE BLOCK    . maxofacial surgery    . ROTATOR CUFF REPAIR Left   . SPINAL CORD STIMULATOR IMPLANT    . SPINE SURGERY    . TMJ ARTHROSCOPY     Past Medical History:  Diagnosis Date  . Back contusion   . Chronic migraine   . Chronic pain   . 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 Fibromyalgia   . Stroke ([place])    BP (!) 153/82   Pulse 88   SpO2 96%   Opioid Risk Score:   Fall Risk Score:  `1  Depression screen PHQ 2/9  Depression screen [facility] 2/9 06/28/2017 03/28/2017 10/31/2016 05/02/2016  Decreased Interest 0 0 0 3  Down, Depressed,  Hopeless 0 0 0 3  PHQ - 2 Score 0 0 0 6  Altered sleeping - - - 3  Tired, decreased energy - - - 3  Change in appetite - - - 2  Feeling bad or failure about yourself  - - - 3  Trouble concentrating - - - 3  Moving slowly or fidgety/restless - - - 3  Suicidal thoughts - - - 3  PHQ-9 Score - - - 26  Difficult doing work/chores - - - Extremely dIfficult    Review of Systems  HENT: Negative.   Eyes: Negative.   Respiratory: Negative.   Cardiovascular: Negative.   Gastrointestinal: Negative.   Endocrine: Negative.   Genitourinary: Negative.   Musculoskeletal:       Spasms  Skin: Positive for rash.  Allergic/Immunologic: Negative.   Neurological: Positive for  numbness.       Tingling  Hematological: Negative.   Psychiatric/Behavioral: Negative.   All other systems reviewed and are negative.      Objective:   Physical Exam  Constitutional: He is oriented to person, place, and time. He appears well-developed and well-nourished.  HENT:  Head: Normocephalic and atraumatic.  Neck: Normal range of motion. Neck supple.  Cervical Paraspinal Tenderness: C-5-C-6   Cardiovascular: Normal rate and regular rhythm.   Pulmonary/Chest: Effort normal and breath sounds normal.  Musculoskeletal:  Normal Muscle Bulk and Muscle Testing Reveals: Upper Extremities: Full ROM and Muscle Strength 5/5 Bilateral AC Joint Tenderness Thoracic Paraspinal Tenderness: T-1-T-3 Lumbar Paraspinal Tenderness: L-3-L-5 Lower Extremities: Full ROM and Muscle Strength 5/5 Arises from chair slowly using straight cane for support Antalgic Gait   Neurological: He is alert and oriented to person, place, and time.  Skin: Skin is warm and dry.  Psychiatric: He has a normal mood and affect.  Nursing note and vitals reviewed.         Assessment & Plan:  1. Chronic pain syndrome consistent with fibromyalgia: 06/28/2017 Continue HEP as tolerated. Continue to Monitor.  Refilled Methadone 10 mg one tablet every 12 hours #66.  We will continue the opioid monitoring program, this consists of regular clinic visits, examinations, urine drug screen, pill counts as well as use of [place] Controlled Substance Reporting System. 2. Peripheral neuropathy: Continue to Monitor 06/28/2017 3. Hx of lumbar spine pain with radiculitis, previous SCS. (still has unit implanted--non-functional). Continue with medication regime and continue to monitor. 06/28/2017 4. 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 06/28/2017 5. ADHD: Refilled Adderall 20 mg daily #30  5. Hx of left RTC injury and surgery x 3: Continue to Monitor. 06/28/2017 6.  Hypogonadism: Continue Testosterone, check level today. 06/28/2017 7. Hypothyroidism: Continue Levothyroxine: Check Blood work today.06/28/2017  8. Migraines: Continue Imitrex  30 minutes of face to face patient care time was spent during this visit. All questions were encouraged and answered.  Follow up in one month with Dr. Naaman Plummer

## 2017-06-30 LAB — TESTOSTERONE: Testosterone: 772 ng/dL (ref 264–916)

## 2017-06-30 LAB — TSH: TSH: 1.56 u[IU]/mL (ref 0.450–4.500)

## 2017-06-30 LAB — T4: T4, Total: 7.5 ug/dL (ref 4.5–12.0)

## 2017-06-30 LAB — T3: T3 TOTAL: 138 ng/dL (ref 71–180)

## 2017-06-30 LAB — T3, FREE: T3 FREE: 3.5 pg/mL (ref 2.0–4.4)

## 2017-06-30 LAB — T4, FREE: Free T4: 1.16 ng/dL (ref 0.82–1.77)

## 2017-07-02 LAB — DRUG TOX MONITOR 1 W/CONF, ORAL FLD
Amphetamines: POSITIVE ng/mL — AB (ref <10)
Barbiturates: NEGATIVE ng/mL (ref <10)
Benzodiazepines: NEGATIVE ng/mL (ref <0.50)
Buprenorphine: NEGATIVE ng/mL (ref <0.10)
Cocaine: NEGATIVE ng/mL (ref <5.0)
EDDP: NEGATIVE ng/mL (ref <5.0)
Fentanyl: NEGATIVE ng/mL (ref <0.10)
Heroin Metabolite: NEGATIVE ng/mL (ref <1.0)
MARIJUANA: NEGATIVE ng/mL (ref <2.5)
MDMA: NEGATIVE ng/mL (ref <10)
METHAMPHETAMINE: NEGATIVE ng/mL (ref <10)
Meprobamate: NEGATIVE ng/mL (ref <2.5)
Methadone: 243.7 ng/mL — ABNORMAL HIGH (ref <5.0)
Methadone: POSITIVE ng/mL — AB (ref <5.0)
NICOTINE METABOLITE: NEGATIVE ng/mL (ref <5.0)
Opiates: NEGATIVE ng/mL (ref <2.5)
Phencyclidine: NEGATIVE ng/mL (ref <10)
TRAMADOL: NEGATIVE ng/mL (ref <5.0)
Tapentadol: NEGATIVE ng/mL (ref <5.0)
Zolpidem: NEGATIVE ng/mL (ref <5.0)

## 2017-07-02 LAB — DRUG TOX METHYLPHEN W/CONF,ORAL FLD: Methylphenidate: NEGATIVE ng/mL (ref <1.0)

## 2017-07-02 LAB — DRUG TOX ALC METAB W/CON, ORAL FLD: ALCOHOL METABOLITE: NEGATIVE ng/mL (ref <25)

## 2017-07-06 ENCOUNTER — Telehealth: Payer: Self-pay | Admitting: *Deleted

## 2017-07-06 NOTE — Telephone Encounter (Signed)
Oral swab drug screen was consistent for prescribed medications.  ?

## 2017-07-26 ENCOUNTER — Ambulatory Visit: Payer: Medicare Other | Admitting: Registered Nurse

## 2017-07-31 ENCOUNTER — Ambulatory Visit: Payer: Medicare Other | Admitting: Physical Medicine & Rehabilitation

## 2017-08-01 ENCOUNTER — Encounter: Payer: Self-pay | Admitting: Physical Medicine & Rehabilitation

## 2017-08-01 ENCOUNTER — Other Ambulatory Visit: Payer: Self-pay

## 2017-08-01 ENCOUNTER — Encounter: Payer: Medicare Other | Attending: Physical Medicine & Rehabilitation | Admitting: Physical Medicine & Rehabilitation

## 2017-08-01 ENCOUNTER — Other Ambulatory Visit: Payer: Self-pay | Admitting: Registered Nurse

## 2017-08-01 DIAGNOSIS — R5382 Chronic fatigue, unspecified: Secondary | ICD-10-CM | POA: Diagnosis not present

## 2017-08-01 DIAGNOSIS — M545 Low back pain: Secondary | ICD-10-CM | POA: Diagnosis not present

## 2017-08-01 DIAGNOSIS — G43109 Migraine with aura, not intractable, without status migrainosus: Secondary | ICD-10-CM | POA: Diagnosis not present

## 2017-08-01 DIAGNOSIS — G950 Syringomyelia and syringobulbia: Secondary | ICD-10-CM | POA: Diagnosis not present

## 2017-08-01 DIAGNOSIS — M797 Fibromyalgia: Secondary | ICD-10-CM

## 2017-08-01 DIAGNOSIS — G894 Chronic pain syndrome: Secondary | ICD-10-CM

## 2017-08-01 DIAGNOSIS — Z5181 Encounter for therapeutic drug level monitoring: Secondary | ICD-10-CM | POA: Diagnosis not present

## 2017-08-01 DIAGNOSIS — G8929 Other chronic pain: Secondary | ICD-10-CM | POA: Insufficient documentation

## 2017-08-01 DIAGNOSIS — Z79899 Other long term (current) drug therapy: Secondary | ICD-10-CM

## 2017-08-01 MED ORDER — AMPHETAMINE-DEXTROAMPHETAMINE 20 MG PO TABS: 20.0000 mg | ORAL_TABLET | Freq: Every day | ORAL | 0 refills | Status: DC

## 2017-08-01 MED ORDER — TESTOSTERONE CYPIONATE 200 MG/ML IM SOLN: 100.0000 mg | INTRAMUSCULAR | 0 refills | Status: DC

## 2017-08-01 MED ORDER — TOPIRAMATE 50 MG PO TABS: 50.0000 mg | ORAL_TABLET | Freq: Every day | ORAL | 2 refills | Status: DC

## 2017-08-01 MED ORDER — METHADONE HCL 10 MG PO TABS: 10.0000 mg | ORAL_TABLET | Freq: Two times a day (BID) | ORAL | 0 refills | Status: DC

## 2017-08-01 MED ORDER — LEVOTHYROXINE SODIUM 25 MCG PO CAPS: 25.0000 ug | ORAL_CAPSULE | Freq: Every day | ORAL | 3 refills | Status: DC

## 2017-08-01 MED ORDER — SUMATRIPTAN SUCCINATE 100 MG PO TABS: 100.0000 mg | ORAL_TABLET | ORAL | 3 refills | Status: DC

## 2017-08-01 NOTE — Patient Instructions (Signed)
PLEASE FEEL FREE TO CALL OUR OFFICE WITH ANY PROBLEMS OR QUESTIONS ([phone])      

## 2017-08-01 NOTE — Progress Notes (Signed)
Subjective:    Patient ID: Derrick Johnson, male    DOB: [birth year], 61 y.o.   MRN: [id number]  HPI   Derrick Johnson is here in follow up of his chronic pain. He has been doing fairly well but has noticed his migraines have been playing a bigger role. He is having headaches for 10-12 days per month, but he's not keeping specific count. He has seen a headache clinic in the remote past. His headaches are often associated with visual aura and sometimes strange smells. He uses imitrex for rescue which can help. He's used it twice each of the last 3 days.   He remains on methadone for pain relief.   Recent hormonal labwork was within normal limits.    Pain Inventory Average Pain 7 Pain Right Now 6 My pain is constant, sharp, burning, dull, stabbing, tingling and aching  In the last 24 hours, has pain interfered with the following? General activity 7 Relation with others 7 Enjoyment of life 7 What TIME of day is your pain at its worst? all Sleep (in general) Fair  Pain is worse with: walking, bending, sitting, standing, unsure and some activites Pain improves with: rest, heat/ice, therapy/exercise and medication Relief from Meds: 5  Mobility use a cane ability to climb steps?  yes do you drive?  yes Do you have any goals in this area?  yes  Function disabled: date disabled 08/11/2003 I need assistance with the following:  meal prep, household duties and shopping  Neuro/Psych numbness tingling spasms  Prior Studies Any changes since last visit?  no  Physicians involved in your care Any changes since last visit?  no   Family History  Problem Relation Age of Onset  . Hyperlipidemia Mother   . Heart disease Father   . Thyroid disease Neg Hx    Social History   Socioeconomic History  . Marital status: Married    Spouse name: Not on file  . Number of children: Not on file  . Years of education: Not on file  . Highest education level: Not on file  Social Needs  .  Financial resource strain: Not on file  . Food insecurity - worry: Not on file  . Food insecurity - inability: Not on file  . Transportation needs - medical: Not on file  . Transportation needs - non-medical: Not on file  Occupational History  . Not on file  Tobacco Use  . Smoking status: Never Smoker  . Smokeless tobacco: Never Used  Substance and Sexual Activity  . Alcohol use: Not on file  . Drug use: No  . Sexual activity: Not on file  Other Topics Concern  . Not on file  Social History Narrative  . Not on file   Past Surgical History:  Procedure Laterality Date  . CRYO INTERCOSTAL NERVE BLOCK    . maxofacial surgery    . ROTATOR CUFF REPAIR Left   . SPINAL CORD STIMULATOR IMPLANT    . SPINE SURGERY    . TMJ ARTHROSCOPY     Past Medical History:  Diagnosis Date  . Back contusion   . Chronic migraine   . Chronic pain   . 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 Fibromyalgia   . Stroke [facility])    There were no vitals taken for this visit.  Opioid Risk Score:   Fall Risk Score:  `1  Depression screen PHQ 2/9  Depression  screen [facility] 2/9 08/01/2017 06/28/2017 03/28/2017 10/31/2016 05/02/2016  Decreased Interest 0 0 0 0 3  Down, Depressed, Hopeless 0 0 0 0 3  PHQ - 2 Score 0 0 0 0 6  Altered sleeping - - - - 3  Tired, decreased energy - - - - 3  Change in appetite - - - - 2  Feeling bad or failure about yourself  - - - - 3  Trouble concentrating - - - - 3  Moving slowly or fidgety/restless - - - - 3  Suicidal thoughts - - - - 3  PHQ-9 Score - - - - 26  Difficult doing work/chores - - - - Extremely dIfficult     Review of Systems  Constitutional: Negative.   HENT: Negative.   Eyes: Negative.   Respiratory: Negative.   Cardiovascular: Negative.   Gastrointestinal: Negative.   Endocrine: Negative.   Genitourinary: Negative.   Musculoskeletal: Negative.   Skin: Negative.   Allergic/Immunologic:  Negative.   Neurological: Negative.   Hematological: Negative.   Psychiatric/Behavioral: Negative.        Objective:   Physical Exam  General: no distress HEENT:Head is normocephalic, atraumatic, PERRLA, EOMI, sclera anicteric, oral mucosa pink and moist, dentition intact, ext ear canals clear,  Neck:Supple without JVD or lymphadenopathy Heart: reg rate Chest: normal effort Abdomen:Soft, non-tender, non-distended, bowel sounds positive. Extremities:Pulses 2+ Skin:surgical scar.  Neuro:attention an issue but functional. CN normal  Musculoskeletal:flexed while standing at 75 degrees. Back TTP Psych:pleasant and cooperative       Assessment & Plan:  1. Chronic pain syndrome consistent with fibromyalgia--pt has had NUMEROUS providers and diagnoses over the years 2. Peripheral neuropathy of unclear origin 3. Hx of lumbar spine pain with radiculitis, previous SCS.(still has unit implanted--non-functional) 4. 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 Hx of left RTC injury and surgery x 3 6. Migraine headaches with aura    Plan:  1. Endocrine:  -Testosterone and vitamin D supplement should continue. -testosterone level 772 last month.  - thyroid supplementation at same dose. I believe this has helped his energy levels                         -free T4 level 1.16 -Refilled adderall 20mg  daily #30.   .  2. Reviewed HEP. Continue with appropriate stretches and techniques . 3. Refilled methadone and imitrex today.  -methadone refilled, #66 total--may continue at this dosing We will continue the opioid monitoring program, this consists of regular clinic visits, examinations, routine drug screening, pill counts as well as use of [place] Controlled Substance Reporting System. [place].   4. Trial of topamax: 50mg  at night to  start. Continue imitrex for breakthrough   -discussed the use of a headache log.  5. 15 minutes of face to face patient care time were spent during this visit. All questions were encouraged and answered. Follow up is in 1 months with me.

## 2017-08-18 DIAGNOSIS — H04123 Dry eye syndrome of bilateral lacrimal glands: Secondary | ICD-10-CM | POA: Diagnosis not present

## 2017-08-18 DIAGNOSIS — H40033 Anatomical narrow angle, bilateral: Secondary | ICD-10-CM | POA: Diagnosis not present

## 2017-08-22 ENCOUNTER — Encounter: Payer: Medicare Other | Attending: Physical Medicine & Rehabilitation | Admitting: Physical Medicine & Rehabilitation

## 2017-08-22 ENCOUNTER — Encounter: Payer: Self-pay | Admitting: Physical Medicine & Rehabilitation

## 2017-08-22 VITALS — BP 122/81 | HR 77

## 2017-08-22 DIAGNOSIS — M545 Low back pain: Secondary | ICD-10-CM | POA: Diagnosis not present

## 2017-08-22 DIAGNOSIS — Z79899 Other long term (current) drug therapy: Secondary | ICD-10-CM | POA: Diagnosis not present

## 2017-08-22 DIAGNOSIS — G950 Syringomyelia and syringobulbia: Secondary | ICD-10-CM

## 2017-08-22 DIAGNOSIS — Z5181 Encounter for therapeutic drug level monitoring: Secondary | ICD-10-CM | POA: Diagnosis not present

## 2017-08-22 DIAGNOSIS — G8929 Other chronic pain: Secondary | ICD-10-CM | POA: Insufficient documentation

## 2017-08-22 DIAGNOSIS — G43109 Migraine with aura, not intractable, without status migrainosus: Secondary | ICD-10-CM

## 2017-08-22 DIAGNOSIS — M797 Fibromyalgia: Secondary | ICD-10-CM | POA: Diagnosis not present

## 2017-08-22 DIAGNOSIS — G894 Chronic pain syndrome: Secondary | ICD-10-CM

## 2017-08-22 MED ORDER — AMPHETAMINE-DEXTROAMPHETAMINE 20 MG PO TABS: 20.0000 mg | ORAL_TABLET | Freq: Every day | ORAL | 0 refills | Status: DC

## 2017-08-22 MED ORDER — METHADONE HCL 10 MG PO TABS: 10.0000 mg | ORAL_TABLET | Freq: Two times a day (BID) | ORAL | 0 refills | Status: DC

## 2017-08-22 MED ORDER — TOPIRAMATE 100 MG PO TABS: 100.0000 mg | ORAL_TABLET | Freq: Every day | ORAL | 4 refills | Status: DC

## 2017-08-22 NOTE — Patient Instructions (Signed)
PLEASE FEEL FREE TO CALL OUR OFFICE WITH ANY PROBLEMS OR QUESTIONS ([phone])  HAVE A HAPPY HOLIDAYS!                     ^                  ^^                ^ ^ ^             ^ ^ ^ ^ ^           ^ ^ ^ ^ ^ ^ ^        ^ ^ ^ ^ ^ ^ ^ ^ ^      ^ ^ ^ ^ ^ ^ ^ ^ ^ ^ ^                ^^^^                ^^^^                ^^^^     

## 2017-08-22 NOTE — Progress Notes (Signed)
Subjective:    Patient ID: Derrick Johnson, male    DOB: 1955-10-10, 61 y.o.   MRN: [id number]  HPI   The patient is here in follow-up of his chronic pain.  At her last visit we started him on Topamax for migraine prophylaxis. He recorded his headaches over 18 days and he had 12 days of headache. The topamax did help the intensity and length of headaches.  He was overall pleased with the results.  There were no tolerance issues.  He had his eyes checked and he needs bi-focals which are about to arrive.   For his general pain he remains on methadone 10-20mg  q12 hours.  He has been trying to maintain a level of physical activity but the same time using common sense.  He was out a little bit in the snow trying to shovel for some exercise but kept it very subdued.  He went out shopping with his wife for Christmas presents over the weekend which she had not done for years apparently.  Pain Inventory Average Pain 6 Pain Right Now 6 My pain is sharp, dull, stabbing, tingling and aching  In the last 24 hours, has pain interfered with the following? General activity 7 Relation with others 7 Enjoyment of life 7 What TIME of day is your pain at its worst? night Sleep (in general) Fair  Pain is worse with: walking, bending, sitting, standing, unsure and some activites Pain improves with: rest, heat/ice, therapy/exercise, pacing activities, medication, TENS and injections Relief from Meds: 6  Mobility use a cane ability to climb steps?  yes do you drive?  yes  Function disabled: date disabled 2004  Neuro/Psych tremor tingling confusion loss of taste or smell  Prior Studies Any changes since last visit?  no  Physicians involved in your care Any changes since last visit?  no   Family History  Problem Relation Age of Onset  . Hyperlipidemia Mother   . Heart disease Father   . Thyroid disease Neg Hx    Social History   Socioeconomic History  . Marital status: Married   Spouse name: Not on file  . Number of children: Not on file  . Years of education: Not on file  . Highest education level: Not on file  Social Needs  . Financial resource strain: Not on file  . Food insecurity - worry: Not on file  . Food insecurity - inability: Not on file  . Transportation needs - medical: Not on file  . Transportation needs - non-medical: Not on file  Occupational History  . Not on file  Tobacco Use  . Smoking status: Never Smoker  . Smokeless tobacco: Never Used  Substance and Sexual Activity  . Alcohol use: Not on file  . Drug use: No  . Sexual activity: Not on file  Other Topics Concern  . Not on file  Social History Narrative  . Not on file   Past Surgical History:  Procedure Laterality Date  . CRYO INTERCOSTAL NERVE BLOCK    . maxofacial surgery    . ROTATOR CUFF REPAIR Left   . SPINAL CORD STIMULATOR IMPLANT    . SPINE SURGERY    . TMJ ARTHROSCOPY     Past Medical History:  Diagnosis Date  . Back contusion   . Chronic migraine   . Chronic pain   . 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 Fibromyalgia   . Stroke (  [place])    There were no vitals taken for this visit.  Opioid Risk Score:   Fall Risk Score:  `1  Depression screen PHQ 2/9  Depression screen [facility] 2/9 08/01/2017 06/28/2017 03/28/2017 10/31/2016 05/02/2016  Decreased Interest 0 0 0 0 3  Down, Depressed, Hopeless 0 0 0 0 3  PHQ - 2 Score 0 0 0 0 6  Altered sleeping - - - - 3  Tired, decreased energy - - - - 3  Change in appetite - - - - 2  Feeling bad or failure about yourself  - - - - 3  Trouble concentrating - - - - 3  Moving slowly or fidgety/restless - - - - 3  Suicidal thoughts - - - - 3  PHQ-9 Score - - - - 26  Difficult doing work/chores - - - - Extremely dIfficult     Review of Systems  Constitutional: Negative.   HENT: Negative.   Eyes: Negative.   Respiratory: Negative.   Cardiovascular: Negative.     Gastrointestinal: Negative.   Endocrine: Negative.   Genitourinary: Negative.   Musculoskeletal: Negative.   Skin: Negative.   Allergic/Immunologic: Negative.   Neurological: Negative.   Hematological: Negative.   Psychiatric/Behavioral: Negative.   All other systems reviewed and are negative.      Objective:   Physical Exam General: no distress HEENT:Head is normocephalic, atraumatic, PERRLA, EOMI, sclera anicteric, oral mucosa pink and moist, dentition intact, ext ear canals clear,  Neck:Supple without JVD or lymphadenopathy Heart:Regular rate Chest:  Normal effort Abdomen:Soft, non-tender, non-distended, bowel sounds positive. Extremities:Pulses 2+ Skin:Surgical scars well-healed r.  Neuro:attention an issue but functional. CN normal  Musculoskeletal:Low back remains tender to palpation as does shoulder girdle.  Posture seems somewhat improved although he tends to flex.  Uses cane for balance. Psych:pleasant and cooperative       Assessment & Plan:  1. Chronic pain syndrome consistent with fibromyalgia--pt has had NUMEROUS providers and diagnoses over the years 2. Peripheral neuropathy of unclear origin 3. Hx of lumbar spine pain with radiculitis, previous SCS.(still has unit implanted--non-functional) 4. 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 Hx of left RTC injury and surgery x 3 6. Migraine headaches with aura    Plan:  1. Endocrine:  -Testosterone and vitamin D supplement should continue. -testosterone level 772 last month.  - thyroid supplementation at same dose. I believe this has helped his energy levels -free T4 level 1.16 -Refilled adderall 20mg  daily #30.  Second Rx was provided today  .  2. Reviewed HEP. Continue with appropriate stretches and techniques.  He needs to maintain a regular level of physical  activity 3. Refilled methadone and imitrex today.  -methadone refilled, #66 total--continue at current dose -We will continue the opioid monitoring program, this consists of regular clinic visits, examinations, routine drug screening, pill counts as well as use of [place] Controlled Sub[place] Reporting System. [place] was reviewed today.    -Second prescriptions were provided today 4. Increase topamax to 100mg  as he had good results with this.  There is potential to increase further if needed            -continue headache log 5. 15 minutes of face to face patient care time were spent during this visit. All questions were encouraged and answered. Follow up is in2 months with me.

## 2017-09-25 ENCOUNTER — Ambulatory Visit: Payer: Medicare Other | Admitting: Physical Medicine & Rehabilitation

## 2017-10-25 ENCOUNTER — Telehealth: Payer: Self-pay | Admitting: *Deleted

## 2017-10-25 ENCOUNTER — Encounter: Payer: Medicare Other | Attending: Physical Medicine & Rehabilitation | Admitting: Physical Medicine & Rehabilitation

## 2017-10-25 ENCOUNTER — Encounter: Payer: Self-pay | Admitting: Physical Medicine & Rehabilitation

## 2017-10-25 VITALS — BP 120/78 | HR 77

## 2017-10-25 DIAGNOSIS — G43109 Migraine with aura, not intractable, without status migrainosus: Secondary | ICD-10-CM | POA: Diagnosis not present

## 2017-10-25 DIAGNOSIS — M797 Fibromyalgia: Secondary | ICD-10-CM

## 2017-10-25 DIAGNOSIS — G950 Syringomyelia and syringobulbia: Secondary | ICD-10-CM | POA: Diagnosis not present

## 2017-10-25 DIAGNOSIS — G894 Chronic pain syndrome: Secondary | ICD-10-CM | POA: Diagnosis not present

## 2017-10-25 DIAGNOSIS — E349 Endocrine disorder, unspecified: Secondary | ICD-10-CM | POA: Diagnosis not present

## 2017-10-25 DIAGNOSIS — R5382 Chronic fatigue, unspecified: Secondary | ICD-10-CM

## 2017-10-25 DIAGNOSIS — Z5181 Encounter for therapeutic drug level monitoring: Secondary | ICD-10-CM | POA: Diagnosis not present

## 2017-10-25 DIAGNOSIS — Z79899 Other long term (current) drug therapy: Secondary | ICD-10-CM | POA: Diagnosis not present

## 2017-10-25 DIAGNOSIS — G8929 Other chronic pain: Secondary | ICD-10-CM | POA: Diagnosis not present

## 2017-10-25 DIAGNOSIS — M545 Low back pain: Secondary | ICD-10-CM | POA: Insufficient documentation

## 2017-10-25 MED ORDER — TESTOSTERONE CYPIONATE 200 MG/ML IM SOLN: 100.0000 mg | INTRAMUSCULAR | 3 refills | Status: DC

## 2017-10-25 MED ORDER — LEVOTHYROXINE SODIUM 25 MCG PO CAPS
25.0000 ug | ORAL_CAPSULE | Freq: Every day | ORAL | 6 refills | Status: DC
Start: 2017-10-25 — End: 2018-06-12

## 2017-10-25 MED ORDER — METHADONE HCL 10 MG PO TABS: 10.0000 mg | ORAL_TABLET | Freq: Two times a day (BID) | ORAL | 0 refills | Status: DC

## 2017-10-25 MED ORDER — AMPHETAMINE-DEXTROAMPHETAMINE 20 MG PO TABS: 20.0000 mg | ORAL_TABLET | Freq: Every day | ORAL | 0 refills | Status: DC

## 2017-10-25 NOTE — Progress Notes (Signed)
Subjective:    Patient ID: Derrick Johnson, male    DOB: 1956-02-08, 62 y.o.   MRN: [id number]  HPI   Marcial is here in follow up of his chronic pain. He is feeling more fatigue since we decreased his testosterone. We last checked a level in November which was WNL for total testosterone. I also increased his topamax at last visit which helped his headache frequency and intensity.   He remains on methadone as prescribed which helps manage his chronic pain.   He uses a cane for balance, denies any falls. Bowels and bladder are functioning ok.      Pain Inventory Average Pain 7 Pain Right Now 6 My pain is sharp, burning, dull, stabbing, tingling and aching  In the last 24 hours, has pain interfered with the following? General activity 8 Relation with others 8 Enjoyment of life 8 What TIME of day is your pain at its worst? night Sleep (in general) Fair  Pain is worse with: walking, bending, sitting, inactivity and unsure Pain improves with: rest, heat/ice, therapy/exercise, pacing activities, medication, TENS and injections Relief from Meds: 6  Mobility use a cane ability to climb steps?  yes do you drive?  yes Do you have any goals in this area?  yes  Function disabled: date disabled 08/11/2003 I need assistance with the following:  meal prep, household duties and shopping Do you have any goals in this area?  yes  Neuro/Psych weakness numbness tremor tingling trouble walking spasms dizziness confusion anxiety  Prior Studies Any changes since last visit?  no  Physicians involved in your care Any changes since last visit?  no   Family History  Problem Relation Age of Onset  . Hyperlipidemia Mother   . Heart disease Father   . Thyroid disease Neg Hx    Social History   Socioeconomic History  . Marital status: Married    Spouse name: Not on file  . Number of children: Not on file  . Years of education: Not on file  . Highest education level: Not on file    Social Needs  . Financial resource strain: Not on file  . Food insecurity - worry: Not on file  . Food insecurity - inability: Not on file  . Transportation needs - medical: Not on file  . Transportation needs - non-medical: Not on file  Occupational History  . Not on file  Tobacco Use  . Smoking status: Never Smoker  . Smokeless tobacco: Never Used  Substance and Sexual Activity  . Alcohol use: Not on file  . Drug use: No  . Sexual activity: Not on file  Other Topics Concern  . Not on file  Social History Narrative  . Not on file   Past Surgical History:  Procedure Laterality Date  . CRYO INTERCOSTAL NERVE BLOCK    . maxofacial surgery    . ROTATOR CUFF REPAIR Left   . SPINAL CORD STIMULATOR IMPLANT    . SPINE SURGERY    . TMJ ARTHROSCOPY     Past Medical History:  Diagnosis Date  . Back contusion   . Chronic migraine   . Chronic pain   . 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 Fibromyalgia   . Stroke [facility])    There were no vitals taken for this visit.  Opioid Risk Score:   Fall Risk Score:  `1  Depression screen PHQ 2/9  Depression screen  [facility] 2/9 08/01/2017 06/28/2017 03/28/2017 10/31/2016 05/02/2016  Decreased Interest 0 0 0 0 3  Down, Depressed, Hopeless 0 0 0 0 3  PHQ - 2 Score 0 0 0 0 6  Altered sleeping - - - - 3  Tired, decreased energy - - - - 3  Change in appetite - - - - 2  Feeling bad or failure about yourself  - - - - 3  Trouble concentrating - - - - 3  Moving slowly or fidgety/restless - - - - 3  Suicidal thoughts - - - - 3  PHQ-9 Score - - - - 26  Difficult doing work/chores - - - - Extremely dIfficult      Review of Systems  Constitutional: Positive for chills and fever.  Gastrointestinal: Positive for constipation.  Neurological: Positive for dizziness, weakness and numbness.       Spasms Tingling   Psychiatric/Behavioral: Positive for confusion. The patient is  nervous/anxious.   All other systems reviewed and are negative.      Objective:   Physical Exam  General: no distress HEENT:Head is normocephalic, atraumatic, PERRLA, EOMI, sclera anicteric, oral mucosa pink and moist, dentition intact, ext ear canals clear,  Neck:Supple without JVD or lymphadenopathy Heart:RRR Chest: normal effort Abdomen:Soft, non-tender, non-distended, bowel sounds positive. Extremities:Pulses 2+ Skin:Skin intact    Neuro:attention an issue but functional. CN normal Musculoskeletal:LB and shoulder girdle TTP.  Posture seems somewhat improved although he tends to flex.  Uses cane for balance. Psych:pleasant and cooperative       Assessment & Plan:  1. Chronic pain syndrome consistent with fibromyalgia--pt has had NUMEROUS providers and diagnoses over the years 2. Peripheral neuropathy of unclear origin 3. Hx of lumbar spine pain with radiculitis, previous SCS.(still has unit implanted--non-functional) 4. 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 Hx of left RTC injury and surgery x 3 6. Migraine headaches with aura--improved with topamax    Plan: 1. Endocrine:  -Testosterone and vitamin D supplement -will increase testosterone back to 100mg  every two weeks and re-check free/total levels in April.  - thyroid supplementation at same dose. RF today.  -free T4 level 1.16 -Refilled adderall 20mg  daily #30.  second Rx was provided today  .  2. Reviewed HEP. Continue with appropriate stretches and techniques.  He needs to maintain a regular level of physical activity 3. continue methadone. Continue imitrex    -methadone refilled, #66 total--continue -We will continue the opioid monitoring program, this consists of regular clinic visits, examinations, routine drug screening, pill counts as well as  use of [place] Controlled Substance Reporting System. [place] was [place] today.     -UDS.            -Second prescriptions were provided today 4.Increased topamax  100mg  with improvement   -may be contributing to fatigue===observe -continue headache log 5. 15 minutes of face to face patient care time were spent during this visit. All questions were encouraged and answered. Follow up  in87months with me.

## 2017-10-25 NOTE — Telephone Encounter (Signed)
Prior authorizations submitted via Covermymeds for methadone and sumatriptan

## 2017-10-26 ENCOUNTER — Telehealth: Payer: Self-pay

## 2017-10-26 NOTE — Telephone Encounter (Signed)
Patient called today, states insurance companies called him and gave him approvals on both medications along with approval numbers.  Methadone = [id number] Sumatriptan = [id number]

## 2017-10-26 NOTE — Telephone Encounter (Signed)
ERROR

## 2017-10-31 ENCOUNTER — Ambulatory Visit: Payer: Medicare Other | Admitting: Physical Medicine & Rehabilitation

## 2017-10-31 LAB — TOXASSURE SELECT,+ANTIDEPR,UR

## 2017-11-01 ENCOUNTER — Telehealth: Payer: Self-pay | Admitting: *Deleted

## 2017-11-01 NOTE — Telephone Encounter (Signed)
Urine drug screen for this encounter is consistent for prescribed medication 

## 2017-11-21 ENCOUNTER — Ambulatory Visit: Payer: Medicare Other | Admitting: Physical Medicine & Rehabilitation

## 2017-11-22 ENCOUNTER — Ambulatory Visit: Payer: Medicare Other | Admitting: Physical Medicine & Rehabilitation

## 2017-12-19 ENCOUNTER — Encounter: Payer: Self-pay | Admitting: Physical Medicine & Rehabilitation

## 2017-12-19 ENCOUNTER — Other Ambulatory Visit: Payer: Self-pay

## 2017-12-19 ENCOUNTER — Encounter: Payer: Medicare Other | Attending: Physical Medicine & Rehabilitation | Admitting: Physical Medicine & Rehabilitation

## 2017-12-19 VITALS — BP 107/73 | HR 89 | Ht 68.0 in | Wt 208.6 lb

## 2017-12-19 DIAGNOSIS — Z79899 Other long term (current) drug therapy: Secondary | ICD-10-CM | POA: Diagnosis not present

## 2017-12-19 DIAGNOSIS — Z5181 Encounter for therapeutic drug level monitoring: Secondary | ICD-10-CM

## 2017-12-19 DIAGNOSIS — G43109 Migraine with aura, not intractable, without status migrainosus: Secondary | ICD-10-CM

## 2017-12-19 DIAGNOSIS — G950 Syringomyelia and syringobulbia: Secondary | ICD-10-CM | POA: Diagnosis not present

## 2017-12-19 DIAGNOSIS — M797 Fibromyalgia: Secondary | ICD-10-CM | POA: Diagnosis not present

## 2017-12-19 DIAGNOSIS — G894 Chronic pain syndrome: Secondary | ICD-10-CM | POA: Diagnosis not present

## 2017-12-19 DIAGNOSIS — G8929 Other chronic pain: Secondary | ICD-10-CM | POA: Insufficient documentation

## 2017-12-19 DIAGNOSIS — M545 Low back pain: Secondary | ICD-10-CM | POA: Insufficient documentation

## 2017-12-19 DIAGNOSIS — M25512 Pain in left shoulder: Secondary | ICD-10-CM

## 2017-12-19 MED ORDER — AMPHETAMINE-DEXTROAMPHETAMINE 20 MG PO TABS: 20.0000 mg | ORAL_TABLET | Freq: Every day | ORAL | 0 refills | Status: DC

## 2017-12-19 MED ORDER — TRAZODONE HCL 150 MG PO TABS: 150.0000 mg | ORAL_TABLET | Freq: Every day | ORAL | 4 refills | Status: DC

## 2017-12-19 MED ORDER — TOPIRAMATE 100 MG PO TABS: 100.0000 mg | ORAL_TABLET | Freq: Every day | ORAL | 4 refills | Status: DC

## 2017-12-19 MED ORDER — METHADONE HCL 10 MG PO TABS: 10.0000 mg | ORAL_TABLET | Freq: Two times a day (BID) | ORAL | 0 refills | Status: DC

## 2017-12-19 MED ORDER — SUMATRIPTAN SUCCINATE 100 MG PO TABS: 100.0000 mg | ORAL_TABLET | ORAL | 3 refills | Status: DC

## 2017-12-19 NOTE — Progress Notes (Signed)
Subjective:    Patient ID: Derrick Johnson, male    DOB: Jun 06, 1956, 62 y.o.   MRN: [id number]  HPI   Derrick Johnson is here in follow up of his chronic pain. He has been doing fairly well over the last couple months. He had a severe migraine this weekend with the weather rolling in.  He is feeling better today however with the stabilization of the weather.  In general he feels that his pain is under reasonable control and that he is able to do more physically that he has previously.  He maintains on methadone as prescribed.  He is also using Adderall for attention and energy which works for him.        Pain Inventory Average Pain 6 Pain Right Now 6 My pain is constant, sharp, dull, stabbing and tingling  In the last 24 hours, has pain interfered with the following? General activity 8 Relation with others 8 Enjoyment of life 8 What TIME of day is your pain at its worst? morning daytime evening night Sleep (in general) Fair  Pain is worse with: n/a Pain improves with: rest, heat/ice, therapy/exercise, pacing activities, medication, TENS and injections Relief from Meds: 4  Mobility walk without assistance walk with assistance use a cane ability to climb steps?  yes do you drive?  yes Do you have any goals in this area?  no  Function disabled: date disabled 08/2003 I need assistance with the following:  meal prep, household duties and shopping  Neuro/Psych numbness tingling spasms anxiety  Prior Studies Any changes since last visit?  no  Physicians involved in your care Any changes since last visit?  no   Family History  Problem Relation Age of Onset  . Hyperlipidemia Mother   . Heart disease Father   . Thyroid disease Neg Hx    Social History   Socioeconomic History  . Marital status: Married    Spouse name: Not on file  . Number of children: Not on file  . Years of education: Not on file  . Highest education level: Not on file  Occupational History  . Not  on file  Social Needs  . Financial resource strain: Not on file  . Food insecurity:    Worry: Not on file    Inability: Not on file  . Transportation needs:    Medical: Not on file    Non-medical: Not on file  Tobacco Use  . Smoking status: Never Smoker  . Smokeless tobacco: Never Used  Substance and Sexual Activity  . Alcohol use: Not on file  . Drug use: No  . Sexual activity: Not on file  Lifestyle  . Physical activity:    Days per week: Not on file    Minutes per session: Not on file  . Stress: Not on file  Relationships  . Social connections:    Talks on phone: Not on file    Gets together: Not on file    Attends religious service: Not on file    Active member of club or organization: Not on file    Attends meetings of clubs or organizations: Not on file    Relationship status: Not on file  Other Topics Concern  . Not on file  Social History Narrative  . Not on file   Past Surgical History:  Procedure Laterality Date  . CRYO INTERCOSTAL NERVE BLOCK    . maxofacial surgery    . ROTATOR CUFF REPAIR Left   . SPINAL  CORD STIMULATOR IMPLANT    . SPINE SURGERY    . TMJ ARTHROSCOPY     Past Medical History:  Diagnosis Date  . Back contusion   . Chronic migraine   . Chronic pain   . 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 Fibromyalgia   . Stroke ([place])    BP 107/73   Pulse 89   Ht 5\' 8"  (1.727 m) Comment: pt reported  Wt 208 lb 9.6 oz (94.6 kg)   SpO2 97%   BMI 31.72 kg/m   Opioid Risk Score:   Fall Risk Score:  `1  Depression screen PHQ 2/9  Depression screen [facility] 2/9 12/19/2017 08/01/2017 06/28/2017 03/28/2017 10/31/2016 05/02/2016  Decreased Interest 0 0 0 0 0 3  Down, Depressed, Hopeless 0 0 0 0 0 3  PHQ - 2 Score 0 0 0 0 0 6  Altered sleeping 0 - - - - 3  Tired, decreased energy 0 - - - - 3  Change in appetite 0 - - - - 2  Feeling bad or failure about yourself  0 - - - - 3  Trouble  concentrating 0 - - - - 3  Moving slowly or fidgety/restless 0 - - - - 3  Suicidal thoughts 0 - - - - 3  PHQ-9 Score 0 - - - - 26  Difficult doing work/chores Not difficult at all - - - - Extremely dIfficult    Review of Systems  Constitutional: Negative.   HENT: Negative.   Eyes: Negative.   Respiratory: Negative.   Cardiovascular: Negative.   Gastrointestinal: Negative.   Endocrine: Negative.   Genitourinary: Negative.   Musculoskeletal: Negative.   Skin: Negative.   Allergic/Immunologic: Negative.   Neurological: Negative.   Hematological: Negative.   Psychiatric/Behavioral: Negative.   All other systems reviewed and are negative.      Objective:   Physical Exam  General: No acute distress HEENT: EOMI, oral membranes moist Cards: reg rate  Chest: normal effort Abdomen: Soft, NT, ND Skin: dry, intact Extremities: no edema Skin:Skin intact   Neuro:attention an issue but functional. CN normal.  Gait stable with cane. Musculoskeletal:Ongoing pain in the low back as well as the shoulder girdle/left shoulder area. Posture fair.  Uses a cane for balance.Marland Kitchen Psych:Pleasant and cooperative is always       Assessment & Plan:  1. Chronic pain syndrome consistent with fibromyalgia--pt has had NUMEROUS providers and diagnoses over the years 2. Peripheral neuropathy of unclear origin 3. Hx of lumbar spine pain with radiculitis, previous SCS.(still has unit implanted--non-functional) 4. 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 Hx of left RTC injury and surgery x 3 6. Migraine headaches with aura--improved with topamax     Plan: 1. Endocrine:  -Testosterone and vitamin D supplement -continue testosterone back to 100mg  every two weeks and re-check free/total levels in June - thyroid supplementation at same dose. RF today.  -Refilled adderall 20mg  daily #30. Second Rx was  provided today  .  2. Reviewed HEP. Continue with appropriate stretches and techniques.He needs to maintain a regular level of physical activity 3. continue methadone. Continue imitrex    -methadone refilled, #66 total--continue -We will continue the controlled substance monitoring program, this consists of regular clinic visits, examinations, routine drug screening, pill counts as well as use of [place] Controlled Substance Reporting System. [place] was reviewed today.  Marland Kitchen           -  Second prescriptions were provided today 4.Maintain topamax  100mg ---this is working for him        --continue headache log 5. 15 minutesof face to face patient care time were spent during this visit. All questions were encouraged and answered. Follow up  in60months with me.

## 2018-01-17 ENCOUNTER — Ambulatory Visit: Payer: Medicare Other | Admitting: Physical Medicine & Rehabilitation

## 2018-02-20 ENCOUNTER — Encounter: Payer: Medicare Other | Admitting: Physical Medicine & Rehabilitation

## 2018-02-20 ENCOUNTER — Encounter: Payer: Self-pay | Admitting: Registered Nurse

## 2018-02-20 ENCOUNTER — Encounter: Payer: Medicare Other | Attending: Physical Medicine & Rehabilitation | Admitting: Registered Nurse

## 2018-02-20 ENCOUNTER — Other Ambulatory Visit: Payer: Self-pay

## 2018-02-20 VITALS — BP 126/84 | HR 60 | Ht 69.0 in | Wt 209.4 lb

## 2018-02-20 DIAGNOSIS — Z5181 Encounter for therapeutic drug level monitoring: Secondary | ICD-10-CM | POA: Diagnosis not present

## 2018-02-20 DIAGNOSIS — E349 Endocrine disorder, unspecified: Secondary | ICD-10-CM | POA: Diagnosis not present

## 2018-02-20 DIAGNOSIS — G894 Chronic pain syndrome: Secondary | ICD-10-CM

## 2018-02-20 DIAGNOSIS — G950 Syringomyelia and syringobulbia: Secondary | ICD-10-CM

## 2018-02-20 DIAGNOSIS — G8929 Other chronic pain: Secondary | ICD-10-CM | POA: Diagnosis not present

## 2018-02-20 DIAGNOSIS — G43109 Migraine with aura, not intractable, without status migrainosus: Secondary | ICD-10-CM | POA: Diagnosis not present

## 2018-02-20 DIAGNOSIS — Z79899 Other long term (current) drug therapy: Secondary | ICD-10-CM | POA: Diagnosis not present

## 2018-02-20 DIAGNOSIS — M545 Low back pain: Secondary | ICD-10-CM | POA: Insufficient documentation

## 2018-02-20 DIAGNOSIS — M25512 Pain in left shoulder: Secondary | ICD-10-CM | POA: Diagnosis not present

## 2018-02-20 DIAGNOSIS — M797 Fibromyalgia: Secondary | ICD-10-CM

## 2018-02-20 MED ORDER — METHADONE HCL 10 MG PO TABS: 10.0000 mg | ORAL_TABLET | Freq: Two times a day (BID) | ORAL | 0 refills | Status: DC

## 2018-02-20 MED ORDER — AMPHETAMINE-DEXTROAMPHETAMINE 20 MG PO TABS: 20.0000 mg | ORAL_TABLET | Freq: Every day | ORAL | 0 refills | Status: DC

## 2018-02-20 NOTE — Progress Notes (Signed)
Subjective:    Patient ID: Derrick Johnson, male    DOB: [birth year], 62 y.o.   MRN: [id number]  HPI: Derrick Johnson is a 62 year old male who returns for follow up appointment for chronic pain and medication refill. He states his pain is located in his neck, lower back, bilateral hips and left lower extremity. He rates his pain 6. His current exercise regime is walking and performing stretching exercises.   Derrick Johnson Morphine Equivalent is 75.00 MME. Last UDS was Performed on 10/25/2017, it was consistent.   Pain Inventory Average Pain 6 Pain Right Now 6 My pain is intermittent, constant, sharp, burning, dull, stabbing and aching  In the last 24 hours, has pain interfered with the following? General activity 7 Relation with others 7 Enjoyment of life 7 What TIME of day is your pain at its worst? all Sleep (in general) Fair  Pain is worse with: some activites Pain improves with: medication Relief from Meds: 5  Mobility walk without assistance walk with assistance use a cane use a walker ability to climb steps?  yes do you drive?  yes Do you have any goals in this area?  yes  Function disabled: date disabled 08/11/2003 I need assistance with the following:  meal prep, household duties and shopping  Neuro/Psych weakness numbness trouble walking  Prior Studies Any changes since last visit?  no  Physicians involved in your care Any changes since last visit?  no   Family History  Problem Relation Age of Onset  . Hyperlipidemia Mother   . Heart disease Father   . Thyroid disease Neg Hx    Social History   Socioeconomic History  . Marital status: Married    Spouse name: Not on file  . Number of children: Not on file  . Years of education: Not on file  . Highest education level: Not on file  Occupational History  . Not on file  Social Needs  . Financial resource strain: Not on file  . Food insecurity:    Worry: Not on file    Inability: Not on file  .  Transportation needs:    Medical: Not on file    Non-medical: Not on file  Tobacco Use  . Smoking status: Never Smoker  . Smokeless tobacco: Never Used  Substance and Sexual Activity  . Alcohol use: Not on file  . Drug use: No  . Sexual activity: Not on file  Lifestyle  . Physical activity:    Days per week: Not on file    Minutes per session: Not on file  . Stress: Not on file  Relationships  . Social connections:    Talks on phone: Not on file    Gets together: Not on file    Attends religious service: Not on file    Active member of club or organization: Not on file    Attends meetings of clubs or organizations: Not on file    Relationship status: Not on file  Other Topics Concern  . Not on file  Social History Narrative  . Not on file   Past Surgical History:  Procedure Laterality Date  . CRYO INTERCOSTAL NERVE BLOCK    . maxofacial surgery    . ROTATOR CUFF REPAIR Left   . SPINAL CORD STIMULATOR IMPLANT    . SPINE SURGERY    . TMJ ARTHROSCOPY     Past Medical History:  Diagnosis Date  . Back contusion   . Chronic  migraine   . Chronic pain   . 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 Fibromyalgia   . Stroke ([place])    BP 126/84   Pulse (!) 58   Ht 5\' 9"  (1.753 m)   Wt 209 lb 6.4 oz (95 kg)   SpO2 97%   BMI 30.92 kg/m   Opioid Risk Score:   Fall Risk Score:  `1  Depression screen PHQ 2/9  Depression screen [facility] 2/9 02/20/2018 12/19/2017 08/01/2017 06/28/2017 03/28/2017 10/31/2016 05/02/2016  Decreased Interest 0 0 0 0 0 0 3  Down, Depressed, Hopeless 0 0 0 0 0 0 3  PHQ - 2 Score 0 0 0 0 0 0 6  Altered sleeping - 0 - - - - 3  Tired, decreased energy - 0 - - - - 3  Change in appetite - 0 - - - - 2  Feeling bad or failure about yourself  - 0 - - - - 3  Trouble concentrating - 0 - - - - 3  Moving slowly or fidgety/restless - 0 - - - - 3  Suicidal thoughts - 0 - - - - 3  PHQ-9 Score - 0 - - - - 26    Difficult doing work/chores - Not difficult at all - - - - Extremely dIfficult    Review of Systems  Constitutional: Negative.   HENT: Negative.   Eyes: Negative.   Respiratory: Negative.   Cardiovascular: Negative.   Gastrointestinal: Negative.   Endocrine: Negative.   Genitourinary: Negative.   Musculoskeletal: Negative.   Skin: Negative.   Allergic/Immunologic: Negative.   Neurological: Negative.   Hematological: Negative.   Psychiatric/Behavioral: Negative.   All other systems reviewed and are negative.      Objective:   Physical Exam  Constitutional: He is oriented to person, place, and time. He appears well-developed and well-nourished.  HENT:  Head: Normocephalic and atraumatic.  Neck: Normal range of motion. Neck supple.  Cervical Paraspinal Tenderness: C-5-C-6  Cardiovascular: Normal rate and regular rhythm.  Pulmonary/Chest: Effort normal and breath sounds normal.  Musculoskeletal:  Normal Muscle Bulk and Muscle Testing Reveals: Upper Extremities: Full ROM and Muscle Strength 5/5 Left AC Joint Tenderness Thoracic Paraspinal Tenderness: T-7-T-9 Lumbar Paraspinal Tenderness: L-3-L-5 Lower Extremities: Full ROM and Muscle Strength 5/5 Arises from Table Slowly Narrow Based Gait   Neurological: He is alert and oriented to person, place, and time.  Skin: Skin is warm and dry.  Psychiatric: He has a normal mood and affect.  Nursing note and vitals reviewed.         Assessment & Plan:  1. Chronic pain syndrome consistent with fibromyalgia: 02/20/2018 Continue HEP as tolerated. Continue to Monitor.  Refilled Methadone 10 mg one- two tablets every 12 hours #66. Second script given for the following month. We will continue the opioid monitoring program, this consists of regular clinic visits, examinations, urine drug screen, pill counts as well as use of [place] Controlled Substance Reporting System. 2. Peripheral neuropathy: Continue to Monitor  02/20/2018 3. Hx of lumbar spine pain with radiculitis, previous SCS. (still has unit implanted--non-functional). Continue with medication regime and continue to monitor. 02/20/2018 4. 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 02/20/2018 5. ADHD: Refilled Adderall 20 mg daily #30. 02/20/2018. 5. Hx of left RTC injury and surgery x 3: Continue to Monitor. 02/20/2018 6. Hypogonadism: Continue Testosterone. 02/20/2018 7. Hypothyroidism: Continue Levothyroxine: Continue  to monitor.02/20/2018  8. Migraines: Continue Imitrex. 02/20/2018 9. Chronic Lef Shoulder Pain: Continue current medication regimen. Continue to monitor. 02/20/2018 30 minutes of face to face patient care time was spent during this visit. All questions were encouraged and answered.  Follow up in two months with Dr. Naaman Plummer, patient's preference

## 2018-02-23 ENCOUNTER — Ambulatory Visit (INDEPENDENT_AMBULATORY_CARE_PROVIDER_SITE_OTHER): Payer: Medicare Other | Admitting: Family Medicine

## 2018-02-23 ENCOUNTER — Encounter: Payer: Self-pay | Admitting: Family Medicine

## 2018-02-23 VITALS — BP 128/87 | HR 78 | Ht 69.0 in | Wt 209.0 lb

## 2018-02-23 DIAGNOSIS — R058 Other specified cough: Secondary | ICD-10-CM

## 2018-02-23 DIAGNOSIS — R05 Cough: Secondary | ICD-10-CM

## 2018-02-23 DIAGNOSIS — Z8709 Personal history of other diseases of the respiratory system: Secondary | ICD-10-CM

## 2018-02-23 DIAGNOSIS — J40 Bronchitis, not specified as acute or chronic: Secondary | ICD-10-CM | POA: Diagnosis not present

## 2018-02-23 MED ORDER — AMOXICILLIN-POT CLAVULANATE 875-125 MG PO TABS: 1.0000 | ORAL_TABLET | Freq: Two times a day (BID) | ORAL | 0 refills | Status: DC

## 2018-02-23 MED ORDER — PREDNISONE 20 MG PO TABS: ORAL_TABLET | ORAL | 0 refills | Status: DC

## 2018-02-23 NOTE — Progress Notes (Signed)
Pt here for an acute care OV today   Impression and Recommendations:    1. Bronchitis   2. Productive cough   3. Hx of asthma     1. Bronchitis, productive cough  -I will write for Rx Augmentin. Advised the patient that if his symptoms take a turn for the worse, then he should proceed with taking the abx.   -Patient has a prescription of tessalon perles at home and I advised him to take it at night to aid with cough at night. Recommended that the patient can intermittently take dayquil and nyquil as needed.   - Seasonal and environmental allergies discussed with patient.  Preventative strategies as first line for management discussed ( such as use of N-95 mask ) and I encouraged use of sterile saline rinses such as Milta Deiters Med or AYR sinus rinses to be done twice daily and after any prolonged exposure to the environment or allergen.      - Discussed the use of over-the-counter medications for symptom control as well.  If continues to worsen despite preventative strategies, take over-the-counter Allegra daily during allergy seasons.  We can consider Flonase, Rhinocort or the like, if symptoms are not well controlled with just oral tablets, nasal rinses and preventative strategies.    Meds ordered this encounter  Medications  . amoxicillin-clavulanate (AUGMENTIN) 875-125 MG tablet    Sig: Take 1 tablet by mouth 2 (two) times daily.    Dispense:  20 tablet    Refill:  0  . predniSONE (DELTASONE) 20 MG tablet    Sig: Take 3 pills a day for 2 days, 2 pills a day for 2 days, 1 pill a day for 2 days then one half pill a day for 2 days then off    Dispense:  14 tablet    Refill:  0    No orders of the defined types were placed in this encounter.    Education and routine counseling performed. Handouts provided  Gross side effects, risk and benefits, and alternatives of medications and treatment plan in general discussed with patient.  Patient is aware that all medications have  potential side effects and we are unable to predict every side effect or drug-drug interaction that may occur.   Patient will call with any questions prior to using medication if they have concerns.  Expresses verbal understanding and consents to current therapy and treatment regimen.  No barriers to understanding were identified.  Red flag symptoms and signs discussed in detail.  Patient expressed understanding regarding what to do in case of emergency\urgent symptoms   Please see AVS handed out to patient at the end of our visit for further patient instructions/ counseling done pertaining to today's office visit.   Return if symptoms worsen or fail to improve, for f/up with Valetta Fuller, your PCP in near future.     Note: This document was prepared occasionally using Dragon voice recognition software and may include unintentional dictation errors in addition to a scribe.  This document serves as a record of services personally performed by Mellody Dance, DO. It was created on her behalf by Steva Colder, a trained medical scribe. The creation of this record is based on the scribe's personal observations and the provider's statements to them.   I have reviewed the above medical documentation for accuracy and completeness and I concur.  Mellody Dance 03/04/18 9:53 PM   --------------------------------------------------------------------------------------------------------------------------------------------------------------------------------------------------------------------------------------------    Subjective:    CC:  Chief Complaint  Patient  presents with  . Cough    HPI: Derrick Johnson is a 62 y.o. male who presents to [place] [facility] today for issues as discussed below.   He reports URI-like symptoms onset 7-10 days ago. He notes that he has a productive cough x green sputum onset yesterday . He states that he is having associated symptoms of subjective  fever, nasal congestion, sore throat, left ear pain/fullness. He was last on abx 2-3 years ago. He states that he has tried Milta Deiters Med Sinus rinses with no relief for his symptoms. He denies any other symptoms. He has a PMHx of asthma.   No problems updated.   Wt Readings from Last 3 Encounters:  02/23/18 209 lb (94.8 kg)  02/20/18 209 lb 6.4 oz (95 kg)  12/19/17 208 lb 9.6 oz (94.6 kg)   BP Readings from Last 3 Encounters:  02/23/18 128/87  02/20/18 126/84  12/19/17 107/73   BMI Readings from Last 3 Encounters:  02/23/18 30.86 kg/m  02/20/18 30.92 kg/m  12/19/17 31.72 kg/m     Patient Care Team    Relationship Specialty Notifications Start End  Mina Marble D, NP PCP - General Family Medicine  10/17/16   Meredith Staggers, MD Consulting Physician Physical Medicine and Rehabilitation  10/17/16   Bayard Hugger, NP Nurse Practitioner Physical Medicine and Rehabilitation  10/17/16      Patient Active Problem List   Diagnosis Date Noted  . Chronic left shoulder pain 12/19/2017  . Testosterone insufficiency 10/25/2017  . Encounter for therapeutic drug monitoring 10/25/2017  . Migraine with aura, not intractable 08/01/2017  . Dyslipidemia (high LDL; low HDL) 01/02/2017  . Hyperthyroidism 11/14/2016  . Insomnia 10/17/2016  . ADHD (attention deficit hyperactivity disorder) 10/17/2016  . Chronic pain syndrome 05/02/2016  . 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 Fatigue 05/02/2016  . Fibromyalgia 05/02/2016    Past Medical history, Surgical history, Family history, Social history, Allergies and Medications have been entered into the medical record, reviewed and changed as needed.    Current Meds  Medication Sig  . albuterol (VENTOLIN HFA) 108 (90 Base) MCG/ACT inhaler Inhale 1-2 puffs into the lungs every 6 (six) hours as needed for wheezing or shortness of breath.  . amphetamine-dextroamphetamine (ADDERALL) 20 MG tablet Take 1 tablet (20 mg total) by mouth daily.  .  cetirizine (ZYRTEC) 10 MG tablet Take 10 mg by mouth daily.  Marland Kitchen docusate sodium (COLACE) 250 MG capsule Take 250 mg by mouth daily.  . Levothyroxine Sodium 25 MCG CAPS Take 1 capsule (25 mcg total) by mouth daily before breakfast.  . methadone (DOLOPHINE) 10 MG tablet Take 1-2 tablets (10-20 mg total) by mouth every 12 (twelve) hours.  . Multiple Vitamin (MULTIVITAMIN) capsule Take 1 capsule by mouth daily.  . SENNOSIDES PO Take 1 tablet by mouth 2 (two) times daily.  . SUMAtriptan (IMITREX) 100 MG tablet Take 1 tablet (100 mg total) by mouth as directed. May repeat in 2 hours if headache persists or recurs.  Marland Kitchen testosterone cypionate (DEPOTESTOSTERONE CYPIONATE) 200 MG/ML injection Inject 0.5 mLs (100 mg total) into the muscle every 14 (fourteen) days.  Marland Kitchen topiramate (TOPAMAX) 100 MG tablet Take 1 tablet (100 mg total) by mouth at bedtime.  . traZODone (DESYREL) 150 MG tablet Take 1 tablet (150 mg total) by mouth daily.  Marland Kitchen VITAMIN D-VITAMIN K PO Take 1 capsule by mouth daily. 10,000IU/ 100 mcg    Allergies:  Allergies  Allergen Reactions  .  Penicillins     Other reaction(s): Unknown     Review of Systems: General:   (+) subjective fever, (+) chills, denies unexplained weight loss.  Optho/Auditory:   Denies visual changes, blurred vision/LOV Respiratory:   Denies wheeze, DOE more than baseline levels. (+) productive cough x green sputum Cardiovascular:   Denies chest pain, palpitations, new onset peripheral edema  Gastrointestinal:   Denies nausea, vomiting, diarrhea, abd pain.  Genitourinary: Denies dysuria, freq/ urgency, flank pain or discharge from genitals.  Endocrine:     Denies hot or cold intolerance, polyuria, polydipsia. Musculoskeletal:   Denies unexplained myalgias, joint swelling, unexplained arthralgias, gait problems.  Skin:  Denies new onset rash, suspicious lesions Neurological:     Denies dizziness, unexplained weakness, numbness  Psychiatric/Behavioral:   Denies mood  changes, suicidal or homicidal ideations, hallucinations    Objective:   Blood pressure 128/87, pulse 78, height 5\' 9"  (1.753 m), weight 209 lb (94.8 kg), SpO2 96 %. Body mass index is 30.86 kg/m. General:  Well Developed, well nourished, appropriate for stated age.  Neuro:  Alert and oriented,  extra-ocular muscles intact  HEENT:  Normocephalic, atraumatic, neck supple. Erythema in posterior oropharynx. Blunted light reflect in left TM with mild bulge.  Skin:  no gross rash, warm, pink. Cardiac:  RRR, S1 S2 Respiratory:  ECTA B/L and A/P, Not using accessory muscles, speaking in full sentences- unlabored. Vascular:  Ext warm, no cyanosis apprec.; cap RF less 2 sec. Psych:  No HI/SI, judgement and insight good, Euthymic mood. Full Affect.

## 2018-02-23 NOTE — Patient Instructions (Signed)
Symptoms for a upper respiratory tract infection usually last 3-7 days but can stretch out to 2-3 weeks before you're feeling back to normal.  Your symptoms should not worsen after 7-10 days and if they do, please start the antibiotics-Augmentin that we gave you.  You can use over-the-counter afrin nasal spray for up to 3 days (NO longer than that) which will help acutely with nasal drainage/ congestion short term.   Also, sterile saline nasal rinses, such as Milta Deiters med or AYR sinus rinses, can be very helpful and should be done twice daily- especially throughout the allergy season.   Remember you should use distilled water or previously boiled water to do this.  Then you may use over-the-counter Flonase 1 spray each nostril twice daily after sinus rinses.   You can also use an over the counter cold and flu medication such as Tylenol Severe Cold and Sinus/Flu or Dayquil, Nyquil and the like, which will help with cough, congestion, headache/ pain, fevers/chills etc.  Please note, if you being treated for hypertension or have high blood pressure, you should be using the cold meds designated "HBP".    Wash your hands frequently, as you did not want to get those around you sick as well. Never sneeze or cough on others.  And you should not be going to school or work if you are running a temperature of 100.5 or more on two separate occasions.   Drink plenty of fluids and stay hydrated, especially if you are running fevers.  We don't know why, but chicken soup also helps, try it! :)    Upper respiratory viral infection, Adult Adenoviruses are common viruses that cause many different types of infections. The viruses usually affect the lungs, but they can also affect other parts of the body, including the eyes, stomach, bowels, bladder, and brain. The most common type of adenovirus infection is the common cold. Usually, adenovirus infections are not severe unless you have another health problem that makes it  hard for your body to fight off infection. What are the causes? You can get this condition if you:  Touch a surface or object that has an adenovirus on it and then touch your mouth, nose, or eyes with unwashed hands.  Come into close physical contact with an infected person, such as by hugging or shaking hands.  Breathe in droplets that fly through the air when an infected person talks, coughs, or sneezes.  Have contact with infected stool.  Swim in a pool that does not have enough chlorine.  Adenoviruses can live outside the body for many weeks. They spread easily from person to person (are contagious). What increases the risk? This condition is more likely to develop in:  People who spend a lot of time in places where there are many people, such as schools, summer camps, daycare centers, community centers, and [employer] recruit training centers.  Elderly adults.  People with a weak body defense system (immune system).  People with a lung disease.  People with a heart condition.  What are the signs or symptoms? Adenovirus infections usually cause flu-like symptoms. Once the virus gets into the body, symptoms of this condition can take up to 14 days to develop. Symptoms may include:  Headache.  Stiff neck.  Sleepiness or fatigue.  Confusion or disorientation.  Fever.  Sore throat.  Cough.  Trouble breathing.  Runny nose or congestion.  Pink eye (conjunctivitis).  Bleeding into the covering of the eye.  Stomachache or diarrhea.  Nausea or vomiting.  Blood in the urine or pain while urinating.  Ear pain or fullness.  How is this diagnosed? This condition may be diagnosed based on your symptoms and a physical exam. Your health care provider may order tests to make sure your symptoms are not caused by another type of problem. Tests can include:  Blood tests.  Urine tests.  Stool tests.  Chest X-ray.  Tissue or throat culture.  How is this  treated? This condition goes away on its own with time. Treatment for this condition involves managing symptoms until the condition goes away. Your health care provider may recommend:  Rest.  Drinking more fluids.  Taking over-the-counter medicine to help relieve a sore throat, fever, or headache.  Follow these instructions at home:  Rest at home until your symptoms go away.  Drink enough fluid to keep your urine clear or pale yellow.  Take over-the-counter and prescription medicines only as told by your health care provider.  Keep all follow-up visits as told by your health care provider. This is important. How is this prevented? Adenoviruses are resistant to many cleaning products and can remain on surfaces for long periods of time. To help prevent infection:  Wash your hands often with soap and water.  Cover your nose or mouth when you sneeze or cough.  Do not touch your eyes, nose, or mouth with unwashed hands.  Clean commonly used objects often.  Do not swim in a pool that is not properly chlorinated.  Avoid close contact with people who are sick.  Do not go to school or work when you are sick.  Contact a health care provider if:  Your symptoms do not improve after 10 days.  Your symptoms get worse.  You cannot eat or drink without vomiting. Get help right away if:  You have trouble breathing or you are breathing rapidly.  Your skin, lips, or fingernails look blue (cyanosis).  You have a rapid heart rate.  You become confused.  You lose consciousness. This information is not intended to replace advice given to you by your health care provider. Make sure you discuss any questions you have with your health care provider. Document Released: 11/12/2002 Document Revised: 04/18/2016 Document Reviewed: 04/18/2016 Elsevier Interactive Patient Education  [employer].

## 2018-04-11 ENCOUNTER — Encounter: Payer: Self-pay | Admitting: Physical Medicine & Rehabilitation

## 2018-04-11 ENCOUNTER — Encounter: Payer: Medicare Other | Attending: Physical Medicine & Rehabilitation | Admitting: Physical Medicine & Rehabilitation

## 2018-04-11 VITALS — BP 127/84 | HR 64 | Ht 69.0 in | Wt 214.0 lb

## 2018-04-11 DIAGNOSIS — G894 Chronic pain syndrome: Secondary | ICD-10-CM | POA: Insufficient documentation

## 2018-04-11 DIAGNOSIS — M797 Fibromyalgia: Secondary | ICD-10-CM

## 2018-04-11 DIAGNOSIS — E349 Endocrine disorder, unspecified: Secondary | ICD-10-CM | POA: Diagnosis not present

## 2018-04-11 DIAGNOSIS — Z5181 Encounter for therapeutic drug level monitoring: Secondary | ICD-10-CM

## 2018-04-11 DIAGNOSIS — Z79899 Other long term (current) drug therapy: Secondary | ICD-10-CM | POA: Diagnosis not present

## 2018-04-11 DIAGNOSIS — G8929 Other chronic pain: Secondary | ICD-10-CM | POA: Diagnosis not present

## 2018-04-11 DIAGNOSIS — G950 Syringomyelia and syringobulbia: Secondary | ICD-10-CM | POA: Diagnosis not present

## 2018-04-11 DIAGNOSIS — M545 Low back pain: Secondary | ICD-10-CM | POA: Insufficient documentation

## 2018-04-11 MED ORDER — METHADONE HCL 10 MG PO TABS: 10.0000 mg | ORAL_TABLET | Freq: Two times a day (BID) | ORAL | 0 refills | Status: DC

## 2018-04-11 MED ORDER — AMPHETAMINE-DEXTROAMPHETAMINE 20 MG PO TABS: 20.0000 mg | ORAL_TABLET | Freq: Every day | ORAL | 0 refills | Status: DC

## 2018-04-11 NOTE — Progress Notes (Signed)
Subjective:    Patient ID: Derrick Johnson, male    DOB: 02-15-1956, 62 y.o.   MRN: [id number]  HPI   Derrick Johnson is here in follow up of his chronic pain. He states that his pain levels have been fairly stable. He had questions about CBD and essential oils and if they were something he could use.   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 He tries to exercise when he can and to his limits.   I discussed check testosterone levels at last visit but did not order.   Pain Inventory Average Pain 5 Pain Right Now 5 My pain is na  In the last 24 hours, has pain interfered with the following? General activity 6 Relation with others 6 Enjoyment of life 6 What TIME of day is your pain at its worst? evening Sleep (in general) Fair  Pain is worse with: sitting, standing and . Pain improves with: rest, heat/ice, therapy/exercise, pacing activities, medication, TENS and injections Relief from Meds: 5  Mobility walk without assistance use a cane ability to climb steps?  yes do you drive?  yes  Function disabled: date disabled 08-11-2003 I need assistance with the following:  meal prep, household duties and shopping  Neuro/Psych numbness tremor tingling spasms anxiety  Prior Studies Any changes since last visit?  no  Physicians involved in your care Any changes since last visit?  no   Family History  Problem Relation Age of Onset  . Hyperlipidemia Mother   . Heart disease Father   . Thyroid disease Neg Hx    Social History   Socioeconomic History  . Marital status: Married    Spouse name: Not on file  . Number of children: Not on file  . Years of education: Not on file  . Highest education level: Not on file  Occupational History  . Not on file  Social Needs  . Financial resource strain: Not on file  . Food insecurity:    Worry: Not on file    Inability: Not on file  . Transportation needs:    Medical: Not on file    Non-medical: Not on file  Tobacco  Use  . Smoking status: Never Smoker  . Smokeless tobacco: Never Used  Substance and Sexual Activity  . Alcohol use: Not on file  . Drug use: No  . Sexual activity: Not on file  Lifestyle  . Physical activity:    Days per week: Not on file    Minutes per session: Not on file  . Stress: Not on file  Relationships  . Social connections:    Talks on phone: Not on file    Gets together: Not on file    Attends religious service: Not on file    Active member of club or organization: Not on file    Attends meetings of clubs or organizations: Not on file    Relationship status: Not on file  Other Topics Concern  . Not on file  Social History Narrative  . Not on file   Past Surgical History:  Procedure Laterality Date  . CRYO INTERCOSTAL NERVE BLOCK    . maxofacial surgery    . ROTATOR CUFF REPAIR Left   . SPINAL CORD STIMULATOR IMPLANT    . SPINE SURGERY    . TMJ ARTHROSCOPY     Past Medical History:  Diagnosis Date  . Back contusion   . Chronic migraine   . Chronic pain   .  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 Fibromyalgia   . Stroke ([place])    BP 127/84   Pulse 64   Ht 5\' 9"  (1.753 m)   Wt 214 lb (97.1 kg)   SpO2 97%   BMI 31.60 kg/m   Opioid Risk Score:   Fall Risk Score:  `1  Depression screen PHQ 2/9  Depression screen [facility] 2/9 02/23/2018 02/20/2018 12/19/2017 08/01/2017 06/28/2017 03/28/2017 10/31/2016  Decreased Interest 1 0 0 0 0 0 0  Down, Depressed, Hopeless - 0 0 0 0 0 0  PHQ - 2 Score 1 0 0 0 0 0 0  Altered sleeping 3 - 0 - - - -  Tired, decreased energy 3 - 0 - - - -  Change in appetite 1 - 0 - - - -  Feeling bad or failure about yourself  0 - 0 - - - -  Trouble concentrating 2 - 0 - - - -  Moving slowly or fidgety/restless 3 - 0 - - - -  Suicidal thoughts 0 - 0 - - - -  PHQ-9 Score 13 - 0 - - - -  Difficult doing work/chores Somewhat difficult - Not difficult at all - - - -     Review  of Systems  Constitutional: Positive for unexpected weight change.  HENT: Negative.   Eyes: Negative.   Respiratory: Positive for cough and wheezing.        Recent URI  Cardiovascular: Negative.   Gastrointestinal: Negative.   Endocrine: Negative.   Genitourinary: Negative.   Musculoskeletal: Positive for arthralgias, back pain, gait problem and myalgias.  Skin: Negative.   Allergic/Immunologic: Negative.   Neurological: Positive for tremors and numbness.  Hematological: Negative.   Psychiatric/Behavioral: Negative.   All other systems reviewed and are negative.      Objective:   Physical Exam  General: No acute distress HEENT: EOMI, oral membranes moist Cards: reg rate  Chest: normal effort Abdomen: Soft, NT, ND Skin: dry, intact Extremities: no edema Neuro:functiona attention. Normal cognition. Normal gait/balance with cane Musculoskeletal:Ongoing pain in the low back as well as the shoulder girdle/left shoulder area. Posture fair.   gait less antalgic, wide based though Psych:Pleasant and cooperative is always       Assessment & Plan:  1. Chronic pain syndrome consistent with fibromyalgia--pt has had NUMEROUS providers and diagnoses over the years 2. Peripheral neuropathy of unclear origin 3. Hx of lumbar spine pain with radiculitis, previous SCS.(still has unit implanted--non-functional) 4. 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 Hx of left RTC injury and surgery x 3 6. Migraine headaches with aura--improved with topamax     Plan: 1. Endocrine:  -Testosterone and vitamin D supplement -continue testosterone back to 100mg  every two weeks   -check levels today - thyroid supplementation at same dose.   -Continue adderall 20mg  daily #30. Rx was provided today for september  .  2. Reviewed HEP. Continue with appropriate stretches and techniques.He  needs to maintain a regular level of physical activity 3.continuemethadone. Continueimitrex  -methadone refilled, #66 total--         -We will continue the controlled substance monitoring program, this consists of regular clinic visits, examinations, routine drug screening, pill counts as well as use of [place] Controlled Substance Reporting System. [place] today.     -  prescription   provided for september          -  reviewed use of CBD oil today          -UDS today 4.Maintain topamax 100mg  for headache prophylaxis --continue headache log 5. 34minutesof face to face patient care time were spent during this visit. All questions were encouraged and answered. Follow up in 39months with me.

## 2018-04-11 NOTE — Patient Instructions (Signed)
PLEASE FEEL FREE TO CALL OUR OFFICE WITH ANY PROBLEMS OR QUESTIONS ([phone])      

## 2018-04-12 LAB — TESTOSTERONE, FREE: TESTOSTERONE FREE: 6.3 pg/mL — AB (ref 6.6–18.1)

## 2018-04-12 LAB — TESTOSTERONE: TESTOSTERONE: 498 ng/dL (ref 264–916)

## 2018-04-13 ENCOUNTER — Telehealth: Payer: Self-pay | Admitting: Physical Medicine & Rehabilitation

## 2018-04-13 NOTE — Telephone Encounter (Signed)
Let Derrick Johnson know that testosterone levels a little on low side but not enough to adjust dose. I would continue as is and recheck late Fall.  thx

## 2018-04-16 MED ORDER — METHADONE HCL 10 MG PO TABS: 10.0000 mg | ORAL_TABLET | Freq: Two times a day (BID) | ORAL | 0 refills | Status: DC

## 2018-04-16 NOTE — Addendum Note (Signed)
Addended by: Alger Simons T on: 04/16/2018 04:24 PM   Modules accepted: Orders

## 2018-04-16 NOTE — Telephone Encounter (Signed)
Mr Deridder notified.

## 2018-04-18 LAB — TOXASSURE SELECT,+ANTIDEPR,UR

## 2018-04-19 ENCOUNTER — Telehealth: Payer: Self-pay | Admitting: *Deleted

## 2018-04-19 NOTE — Telephone Encounter (Signed)
Urine drug screen for this encounter is consistent for prescribed medication 

## 2018-06-12 ENCOUNTER — Encounter: Payer: Self-pay | Admitting: Physical Medicine & Rehabilitation

## 2018-06-12 ENCOUNTER — Encounter: Payer: Medicare Other | Attending: Physical Medicine & Rehabilitation | Admitting: Physical Medicine & Rehabilitation

## 2018-06-12 VITALS — BP 121/82 | HR 75 | Resp 14 | Ht 69.0 in | Wt 215.0 lb

## 2018-06-12 DIAGNOSIS — M797 Fibromyalgia: Secondary | ICD-10-CM | POA: Diagnosis not present

## 2018-06-12 DIAGNOSIS — M545 Low back pain: Secondary | ICD-10-CM | POA: Insufficient documentation

## 2018-06-12 DIAGNOSIS — E349 Endocrine disorder, unspecified: Secondary | ICD-10-CM

## 2018-06-12 DIAGNOSIS — G43109 Migraine with aura, not intractable, without status migrainosus: Secondary | ICD-10-CM

## 2018-06-12 DIAGNOSIS — G950 Syringomyelia and syringobulbia: Secondary | ICD-10-CM | POA: Diagnosis not present

## 2018-06-12 DIAGNOSIS — Z79899 Other long term (current) drug therapy: Secondary | ICD-10-CM | POA: Diagnosis not present

## 2018-06-12 DIAGNOSIS — G8929 Other chronic pain: Secondary | ICD-10-CM | POA: Diagnosis not present

## 2018-06-12 DIAGNOSIS — R5382 Chronic fatigue, unspecified: Secondary | ICD-10-CM | POA: Diagnosis not present

## 2018-06-12 DIAGNOSIS — G894 Chronic pain syndrome: Secondary | ICD-10-CM | POA: Insufficient documentation

## 2018-06-12 DIAGNOSIS — Z5181 Encounter for therapeutic drug level monitoring: Secondary | ICD-10-CM

## 2018-06-12 MED ORDER — METHADONE HCL 10 MG PO TABS: 10.0000 mg | ORAL_TABLET | Freq: Two times a day (BID) | ORAL | 0 refills | Status: DC

## 2018-06-12 MED ORDER — TOPIRAMATE 100 MG PO TABS: 100.0000 mg | ORAL_TABLET | Freq: Every day | ORAL | 4 refills | Status: DC

## 2018-06-12 MED ORDER — AMPHETAMINE-DEXTROAMPHETAMINE 20 MG PO TABS: 20.0000 mg | ORAL_TABLET | Freq: Every day | ORAL | 0 refills | Status: DC

## 2018-06-12 MED ORDER — TESTOSTERONE CYPIONATE 200 MG/ML IM SOLN: 150.0000 mg | INTRAMUSCULAR | 3 refills | Status: DC

## 2018-06-12 MED ORDER — LEVOTHYROXINE SODIUM 25 MCG PO CAPS: 25.0000 ug | ORAL_CAPSULE | Freq: Every day | ORAL | 6 refills | Status: DC

## 2018-06-12 NOTE — Progress Notes (Signed)
Subjective:    Patient ID: Derrick Johnson, male    DOB: 1956-03-20, 62 y.o.   MRN: [id number]  HPI   Derrick Johnson is here in follow up of his chronic pain. The last months have been "good" but he's had some migraines recently with the weather change. 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   He remains methadone for pain mgt, 10-20 m q12. He also uses topamax for headaches which he has found very helpful.   I decided to hold on a testosterone increase given his last labs. He still feels that his energy is lacking at times and is interested in an increase.   Mood has been good.      Pain Inventory Average Pain 6 Pain Right Now 6 My pain is constant, sharp, burning, dull, stabbing, tingling and aching  In the last 24 hours, has pain interfered with the following? General activity 7 Relation with others 7 Enjoyment of life 7 What TIME of day is your pain at its worst? all Sleep (in general) Poor  Pain is worse with: walking, bending, sitting, inactivity, standing and some activites Pain improves with: rest, heat/ice, therapy/exercise, pacing activities, medication, TENS and injections Relief from Meds: no selection  Mobility walk without assistance walk with assistance use a cane use a walker ability to climb steps?  yes do you drive?  yes Do you have any goals in this area?  yes  Function disabled: date disabled . I need assistance with the following:  meal prep, household duties and shopping Do you have any goals in this area?  yes  Neuro/Psych numbness tremor tingling spasms anxiety  Prior Studies Any changes since last visit?  no  Physicians involved in your care Any changes since last visit?  no   Family History  Problem Relation Age of Onset  . Hyperlipidemia Mother   . Heart disease Father   . Thyroid disease Neg Hx    Social History   Socioeconomic  History  . Marital status: Married    Spouse name: Not on file  . Number of children: Not on file  . Years of education: Not on file  . Highest education level: Not on file  Occupational History  . Not on file  Social Needs  . Financial resource strain: Not on file  . Food insecurity:    Worry: Not on file    Inability: Not on file  . Transportation needs:    Medical: Not on file    Non-medical: Not on file  Tobacco Use  . Smoking status: Never Smoker  . Smokeless tobacco: Never Used  Substance and Sexual Activity  . Alcohol use: Not on file  . Drug use: No  . Sexual activity: Not on file  Lifestyle  . Physical activity:    Days per week: Not on file    Minutes per session: Not on file  . Stress: Not on file  Relationships  . Social connections:    Talks on phone: Not on file    Gets together: Not on file    Attends religious service: Not on file    Active member of club or organization: Not on file    Attends meetings of clubs or organizations: Not on file    Relationship status: Not on file  Other Topics Concern  . Not on file  Social History Narrative  . Not on file   Past Surgical History:  Procedure Laterality Date  . CRYO INTERCOSTAL NERVE BLOCK    . maxofacial surgery    . ROTATOR CUFF REPAIR Left   . SPINAL CORD STIMULATOR IMPLANT    . SPINE SURGERY    . TMJ ARTHROSCOPY     Past Medical History:  Diagnosis Date  . Back contusion   . Chronic migraine   . Chronic pain   . 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 Fibromyalgia   . Stroke ([place])    BP 121/82 (BP Location: Right Arm, Patient Position: Sitting, Cuff Size: Normal)   Pulse 75   Resp 14   Ht 5\' 9"  (1.753 m)   Wt 215 lb (97.5 kg)   SpO2 97%   BMI 31.75 kg/m   Opioid Risk Score:   Fall Risk Score:  `1  Depression screen PHQ 2/9  Depression screen [facility] 2/9 02/23/2018 02/20/2018 12/19/2017 08/01/2017 06/28/2017 03/28/2017 10/31/2016   Decreased Interest 1 0 0 0 0 0 0  Down, Depressed, Hopeless - 0 0 0 0 0 0  PHQ - 2 Score 1 0 0 0 0 0 0  Altered sleeping 3 - 0 - - - -  Tired, decreased energy 3 - 0 - - - -  Change in appetite 1 - 0 - - - -  Feeling bad or failure about yourself  0 - 0 - - - -  Trouble concentrating 2 - 0 - - - -  Moving slowly or fidgety/restless 3 - 0 - - - -  Suicidal thoughts 0 - 0 - - - -  PHQ-9 Score 13 - 0 - - - -  Difficult doing work/chores Somewhat difficult - Not difficult at all - - - -    Review of Systems  Constitutional: Negative.   HENT: Negative.   Eyes: Positive for photophobia and visual disturbance.  Respiratory: Negative.   Cardiovascular: Negative.   Gastrointestinal: Negative.   Endocrine: Negative.   Musculoskeletal: Positive for back pain, gait problem, neck pain and neck stiffness.       Spasms   Skin: Negative.   Allergic/Immunologic: Negative.   Neurological: Positive for tremors, numbness and headaches.       Tingling  Psychiatric/Behavioral: The patient is nervous/anxious.   All other systems reviewed and are negative.      Objective:   Physical Exam  General: No acute distress HEENT: EOMI, oral membranes moist Cards: reg rate  Chest: normal effort Abdomen: Soft, NT, ND Skin: dry, intact Extremities: no edema Neuro:functional balance, normal CN, strength 4-5/5 Musculoskeletal:Ongoing pain in the low back as well as the shoulder girdle/left shoulder area. Posture fair.  gait wide based.  Psych: pleasant       Assessment & Plan:  1. Chronic pain syndrome consistent with fibromyalgia  2. Peripheral neuropathy of unclear origin 3. Hx of lumbar spine pain with radiculitis, previous SCS.(still has unit implanted--non-functional) 4. 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 Hx of left RTC injury and surgery x 3 6. Migraine headaches with aura--improved with topamax     Plan: 1. Endocrine:    -Testosterone and vitamin D supplement -given borderline levels, we decided to increase him to 150mg  every 2 weeks            - thyroid supplementation at same dose.   -Continue adderall 20mg  daily #30. Medication  was refilled and a second prescription was sent to the patient's pharmacy for next month.    .  2. continue with HEP, physical activity as we have described.  3.continuemethadone. Continueimitrex  -methadone refilled, #66 total--             -We will continue the controlled substance monitoring program, this consists of regular clinic visits, examinations, routine drug screening, pill counts as well as use of [place] Controlled Substance Reporting System. [place] was reviewed today.              -Medication was refilled and a second prescription was sent to the patient's pharmacy for next month.               4.Maintaintopamax 100mg  for headache prophylaxis --continue headache log       -aimovig? 5. 77minutesof face to face patient care time were spent during this visit. All questions were encouraged and answered. Follow up in 75months with me.

## 2018-06-12 NOTE — Patient Instructions (Signed)
PLEASE FEEL FREE TO CALL OUR OFFICE WITH ANY PROBLEMS OR QUESTIONS ([phone])      

## 2018-08-08 ENCOUNTER — Telehealth: Payer: Self-pay | Admitting: *Deleted

## 2018-08-08 DIAGNOSIS — G950 Syringomyelia and syringobulbia: Secondary | ICD-10-CM

## 2018-08-08 DIAGNOSIS — M797 Fibromyalgia: Secondary | ICD-10-CM

## 2018-08-08 DIAGNOSIS — G894 Chronic pain syndrome: Secondary | ICD-10-CM

## 2018-08-08 DIAGNOSIS — Z5181 Encounter for therapeutic drug level monitoring: Secondary | ICD-10-CM

## 2018-08-08 DIAGNOSIS — Z79899 Other long term (current) drug therapy: Secondary | ICD-10-CM

## 2018-08-08 NOTE — Telephone Encounter (Signed)
Derrick Johnson called stating he will run out of methadone before his visit 08/14/2018 with Derrick Johnson. He states he will run out Friday.  He is asking if Derrick Johnson would please send in rx's for methadone and adderral.  He will keep his appt and looks forward to seeing Derrick Johnson at that time

## 2018-08-09 MED ORDER — METHADONE HCL 10 MG PO TABS: 10.0000 mg | ORAL_TABLET | Freq: Two times a day (BID) | ORAL | 0 refills | Status: DC

## 2018-08-09 MED ORDER — AMPHETAMINE-DEXTROAMPHETAMINE 20 MG PO TABS: 20.0000 mg | ORAL_TABLET | Freq: Every day | ORAL | 0 refills | Status: DC

## 2018-08-09 NOTE — Telephone Encounter (Signed)
rx'es writtten

## 2018-08-09 NOTE — Telephone Encounter (Signed)
Notified. 

## 2018-08-13 ENCOUNTER — Encounter: Payer: Medicare Other | Admitting: Physical Medicine & Rehabilitation

## 2018-08-14 ENCOUNTER — Encounter: Payer: Medicare Other | Attending: Physical Medicine & Rehabilitation | Admitting: Physical Medicine & Rehabilitation

## 2018-08-14 ENCOUNTER — Encounter: Payer: Self-pay | Admitting: Physical Medicine & Rehabilitation

## 2018-08-14 VITALS — BP 122/82 | HR 57 | Resp 14 | Ht 68.0 in | Wt 214.0 lb

## 2018-08-14 DIAGNOSIS — M545 Low back pain: Secondary | ICD-10-CM | POA: Diagnosis not present

## 2018-08-14 DIAGNOSIS — M797 Fibromyalgia: Secondary | ICD-10-CM | POA: Diagnosis not present

## 2018-08-14 DIAGNOSIS — Z79899 Other long term (current) drug therapy: Secondary | ICD-10-CM

## 2018-08-14 DIAGNOSIS — G43109 Migraine with aura, not intractable, without status migrainosus: Secondary | ICD-10-CM | POA: Diagnosis not present

## 2018-08-14 DIAGNOSIS — G950 Syringomyelia and syringobulbia: Secondary | ICD-10-CM

## 2018-08-14 DIAGNOSIS — G8929 Other chronic pain: Secondary | ICD-10-CM | POA: Diagnosis not present

## 2018-08-14 DIAGNOSIS — G894 Chronic pain syndrome: Secondary | ICD-10-CM

## 2018-08-14 DIAGNOSIS — Z79891 Long term (current) use of opiate analgesic: Secondary | ICD-10-CM

## 2018-08-14 DIAGNOSIS — Z5181 Encounter for therapeutic drug level monitoring: Secondary | ICD-10-CM | POA: Diagnosis not present

## 2018-08-14 MED ORDER — FREMANEZUMAB-VFRM 225 MG/1.5ML ~~LOC~~ SOSY: 225.0000 mg | PREFILLED_SYRINGE | SUBCUTANEOUS | 3 refills | Status: DC

## 2018-08-14 MED ORDER — AMPHETAMINE-DEXTROAMPHETAMINE 20 MG PO TABS: 20.0000 mg | ORAL_TABLET | Freq: Every day | ORAL | 0 refills | Status: DC

## 2018-08-14 MED ORDER — METHADONE HCL 10 MG PO TABS: 10.0000 mg | ORAL_TABLET | Freq: Two times a day (BID) | ORAL | 0 refills | Status: DC

## 2018-08-14 MED ORDER — ERENUMAB-AOOE 70 MG/ML ~~LOC~~ SOAJ: 70.0000 mg | SUBCUTANEOUS | 3 refills | Status: DC

## 2018-08-14 MED ORDER — SUMATRIPTAN SUCCINATE 100 MG PO TABS: 100.0000 mg | ORAL_TABLET | ORAL | 3 refills | Status: DC

## 2018-08-14 NOTE — Progress Notes (Signed)
Subjective:    Patient ID: Derrick Johnson, male    DOB: [birth year], 62 y.o.   MRN: [id number]  HPI  Mr. Franzen is here in follow up of his chronic pain. He woke up with a severe migraine yesterday morning. He said if his visit had been yesterday, he couldn't have come in to the office.  He has noticed an increase in his migraines over the last few months. They are occurring at least 15-20 days per months. He has learned "to work through them" to a degree, but he finds that difficult. He is on topamax daily as well as imitrex for breakthrough symptoms. He took a few imitrex yesterday over the course of 24 hours.   From a general standpoint, he is using methadone 10-20mg  q12 hours.   On a positive note, he's walking more and no longer always using his pain.     Pain Inventory Average Pain 6 Pain Right Now 6 My pain is constant, sharp, dull, stabbing, tingling and aching  In the last 24 hours, has pain interfered with the following? General activity 7 Relation with others 7 Enjoyment of life 7 What TIME of day is your pain at its worst? night Sleep (in general) Poor  Pain is worse with: walking, sitting, standing and some activites Pain improves with: rest, heat/ice and medication Relief from Meds: 4  Mobility walk without assistance walk with assistance use a cane ability to climb steps?  yes do you drive?  yes Do you have any goals in this area?  yes  Function not employed: date last employed . disabled: date disabled . I need assistance with the following:  meal prep, household duties and shopping  Neuro/Psych numbness tremor tingling anxiety  Prior Studies Any changes since last visit?  no  Physicians involved in your care Any changes since last visit?  no   Family History  Problem Relation Age of Onset  . Hyperlipidemia Mother   . Heart disease Father   . Thyroid disease Neg Hx    Social History   Socioeconomic History  . Marital status: Married   Spouse name: Not on file  . Number of children: Not on file  . Years of education: Not on file  . Highest education level: Not on file  Occupational History  . Not on file  Social Needs  . Financial resource strain: Not on file  . Food insecurity:    Worry: Not on file    Inability: Not on file  . Transportation needs:    Medical: Not on file    Non-medical: Not on file  Tobacco Use  . Smoking status: Never Smoker  . Smokeless tobacco: Never Used  Substance and Sexual Activity  . Alcohol use: Not on file  . Drug use: No  . Sexual activity: Not on file  Lifestyle  . Physical activity:    Days per week: Not on file    Minutes per session: Not on file  . Stress: Not on file  Relationships  . Social connections:    Talks on phone: Not on file    Gets together: Not on file    Attends religious service: Not on file    Active member of club or organization: Not on file    Attends meetings of clubs or organizations: Not on file    Relationship status: Not on file  Other Topics Concern  . Not on file  Social History Narrative  . Not on file  Past Surgical History:  Procedure Laterality Date  . CRYO INTERCOSTAL NERVE BLOCK    . maxofacial surgery    . ROTATOR CUFF REPAIR Left   . SPINAL CORD STIMULATOR IMPLANT    . SPINE SURGERY    . TMJ ARTHROSCOPY     Past Medical History:  Diagnosis Date  . Back contusion   . Chronic migraine   . Chronic pain   . 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 Fibromyalgia   . Stroke ([facility])    BP 122/82   Pulse (!) 57   Resp 14   Ht 5\' 8"  (1.727 m)   Wt 214 lb (97.1 kg)   SpO2 96%   BMI 32.54 kg/m   Opioid Risk Score:   Fall Risk Score:  `1  Depression screen PHQ 2/9  Depression screen [facility] 2/9 02/23/2018 02/20/2018 12/19/2017 08/01/2017 06/28/2017 03/28/2017 10/31/2016  Decreased Interest 1 0 0 0 0 0 0  Down, Depressed, Hopeless - 0 0 0 0 0 0  PHQ - 2 Score 1 0 0 0 0 0 0   Altered sleeping 3 - 0 - - - -  Tired, decreased energy 3 - 0 - - - -  Change in appetite 1 - 0 - - - -  Feeling bad or failure about yourself  0 - 0 - - - -  Trouble concentrating 2 - 0 - - - -  Moving slowly or fidgety/restless 3 - 0 - - - -  Suicidal thoughts 0 - 0 - - - -  PHQ-9 Score 13 - 0 - - - -  Difficult doing work/chores Somewhat difficult - Not difficult at all - - - -    Review of Systems  Constitutional: Negative.   HENT: Negative.   Eyes: Negative.   Respiratory: Negative.   Cardiovascular: Negative.   Gastrointestinal: Negative.   Endocrine: Negative.   Genitourinary: Negative.   Musculoskeletal: Positive for back pain, neck pain and neck stiffness.  Skin: Negative.   Allergic/Immunologic: Negative.   Neurological: Positive for tremors, numbness and headaches.       Tingling  Hematological: Negative.   Psychiatric/Behavioral: The patient is nervous/anxious.   All other systems reviewed and are negative.      Objective:   Physical Exam General: mild distress HEENT: EOMI, oral membranes moist Cards: reg rate  Chest: normal effort Abdomen: Soft, NT, ND Skin: dry, intact Extremities: no edema  Neuro:functional balance, normal CN, strength 5/5 Musculoskeletal:Ongoing pain in the low back as well as the shoulder girdle/left shoulder area. Posture fair. gait wide based.  Psych: pleasant and cooperative as always       Assessment & Plan:  1. Chronic pain syndrome consistent with fibromyalgia  2. Peripheral neuropathy of unclear origin 3. Hx of lumbar spine pain with radiculitis, previous SCS.(still has unit implanted--non-functional) 4. 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 Hx of left RTC injury and surgery x 3 6. Migraine headaches with aura--increased over last few months     Plan: 1. Endocrine:  -Testosterone and vitamin D supplement - 150mg  every 2 weeks -  thyroid supplementation at same dose.  -Continueadderall 20mg  daily #30. rf for 09/07/18 .  2. continue with HEP, physical activity as we have described.  3.continuemethadone. Continueimitrex  -methadone refilled, #66 total. Has rx from 08/09/18, RF dated 09/07/18- -We will continue the controlled substance monitoring program, this consists of regular clinic visits,  examinations, routine drug screening, pill counts as well as use of [place] Controlled Substance Reporting System. [place].     -UDS today 4.Maintaintopamax 100mg  for headache prophylaxis --continue headache records.          -aimovig trial, samples provided, instructions given   -70mg  monthly 5. 18minutesof face to face patient care time were spent during this visit. All questions were encouraged and answered. Follow up in 103months with me.

## 2018-08-14 NOTE — Patient Instructions (Signed)
PLEASE FEEL FREE TO CALL OUR OFFICE WITH ANY PROBLEMS OR QUESTIONS ([phone])      

## 2018-08-20 LAB — TOXASSURE SELECT,+ANTIDEPR,UR

## 2018-08-21 ENCOUNTER — Telehealth: Payer: Self-pay | Admitting: *Deleted

## 2018-08-21 NOTE — Telephone Encounter (Signed)
Urine drug screen for this encounter is consistent for prescribed medication 

## 2018-09-11 ENCOUNTER — Telehealth: Payer: Self-pay | Admitting: *Deleted

## 2018-09-11 NOTE — Telephone Encounter (Signed)
Prior authorization submitted to [facility] via Covermymeds for methadone 10 mg

## 2018-10-09 ENCOUNTER — Encounter: Payer: Self-pay | Admitting: Physical Medicine & Rehabilitation

## 2018-10-09 ENCOUNTER — Encounter: Payer: Medicare Other | Attending: Physical Medicine & Rehabilitation | Admitting: Physical Medicine & Rehabilitation

## 2018-10-09 ENCOUNTER — Other Ambulatory Visit: Payer: Self-pay

## 2018-10-09 DIAGNOSIS — G43109 Migraine with aura, not intractable, without status migrainosus: Secondary | ICD-10-CM

## 2018-10-09 DIAGNOSIS — G894 Chronic pain syndrome: Secondary | ICD-10-CM | POA: Insufficient documentation

## 2018-10-09 DIAGNOSIS — Z5181 Encounter for therapeutic drug level monitoring: Secondary | ICD-10-CM | POA: Diagnosis not present

## 2018-10-09 DIAGNOSIS — G950 Syringomyelia and syringobulbia: Secondary | ICD-10-CM

## 2018-10-09 DIAGNOSIS — Z79899 Other long term (current) drug therapy: Secondary | ICD-10-CM | POA: Diagnosis not present

## 2018-10-09 DIAGNOSIS — M797 Fibromyalgia: Secondary | ICD-10-CM

## 2018-10-09 DIAGNOSIS — M545 Low back pain: Secondary | ICD-10-CM | POA: Diagnosis not present

## 2018-10-09 DIAGNOSIS — G8929 Other chronic pain: Secondary | ICD-10-CM | POA: Insufficient documentation

## 2018-10-09 MED ORDER — METHADONE HCL 10 MG PO TABS: 10.0000 mg | ORAL_TABLET | Freq: Two times a day (BID) | ORAL | 0 refills | Status: DC

## 2018-10-09 MED ORDER — AMPHETAMINE-DEXTROAMPHETAMINE 20 MG PO TABS: 20.0000 mg | ORAL_TABLET | Freq: Every day | ORAL | 0 refills | Status: DC

## 2018-10-09 MED ORDER — TOPIRAMATE 100 MG PO TABS: 50.0000 mg | ORAL_TABLET | Freq: Two times a day (BID) | ORAL | 4 refills | Status: DC

## 2018-10-09 NOTE — Progress Notes (Signed)
Subjective:    Patient ID: Derrick Johnson, male    DOB: 01/19/1956, 63 y.o.   MRN: [id number]  HPI   Derrick Johnson is here in follow-up of his chronic pain syndrome.  I last saw him on 08/14/18. Since I last saw him he states it's been a "bad six weeks".   He feels that aimovig caused constipation and cramping and  his headaches haven't improved also.  The symptoms presented after the first dose and actually got worse in the second month.  Continues with the Topamax as prescribed which has helped.  He remains on 100 mg at night.  Uses Adderall for attention and methadone for his pain control during the day.  He has had some other stresses develop including his mother who will be moving into his house with him.  She is 62 years old.    Pain Inventory Average Pain 6 Pain Right Now 6 My pain is constant, sharp, burning, dull, stabbing, tingling and aching  In the last 24 hours, has pain interfered with the following? General activity 8 Relation with others 9 Enjoyment of life 9 What TIME of day is your pain at its worst? all Sleep (in general) Fair  Pain is worse with: walking, bending, sitting, standing and some activites Pain improves with: heat/ice, pacing activities, medication, TENS and injections Relief from Meds: 5  Mobility use a cane ability to climb steps?  yes do you drive?  yes  Function disabled: date disabled 2004 I need assistance with the following:  meal prep, household duties and shopping  Neuro/Psych spasms anxiety  Prior Studies Any changes since last visit?  no  Physicians involved in your care Any changes since last visit?  no   Family History  Problem Relation Age of Onset  . Hyperlipidemia Mother   . Heart disease Father   . Thyroid disease Neg Hx    Social History   Socioeconomic History  . Marital status: Married    Spouse name: Not on file  . Number of children: Not on file  . Years of education: Not on file  . Highest education  level: Not on file  Occupational History  . Not on file  Social Needs  . Financial resource strain: Not on file  . Food insecurity:    Worry: Not on file    Inability: Not on file  . Transportation needs:    Medical: Not on file    Non-medical: Not on file  Tobacco Use  . Smoking status: Never Smoker  . Smokeless tobacco: Never Used  Substance and Sexual Activity  . Alcohol use: Not on file  . Drug use: No  . Sexual activity: Not on file  Lifestyle  . Physical activity:    Days per week: Not on file    Minutes per session: Not on file  . Stress: Not on file  Relationships  . Social connections:    Talks on phone: Not on file    Gets together: Not on file    Attends religious service: Not on file    Active member of club or organization: Not on file    Attends meetings of clubs or organizations: Not on file    Relationship status: Not on file  Other Topics Concern  . Not on file  Social History Narrative  . Not on file   Past Surgical History:  Procedure Laterality Date  . CRYO INTERCOSTAL NERVE BLOCK    . maxofacial surgery    .  ROTATOR CUFF REPAIR Left   . SPINAL CORD STIMULATOR IMPLANT    . SPINE SURGERY    . TMJ ARTHROSCOPY     Past Medical History:  Diagnosis Date  . Back contusion   . Chronic migraine   . Chronic pain   . 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 Fibromyalgia   . Stroke ([place])    BP 123/87   Pulse 98   Ht 5\' 8"  (1.727 m)   Wt 220 lb (99.8 kg)   SpO2 97%   BMI 33.45 kg/m   Opioid Risk Score:   Fall Risk Score:  `1  Depression screen PHQ 2/9  Depression screen [facility] 2/9 10/09/2018 02/23/2018 02/20/2018 12/19/2017 08/01/2017 06/28/2017 03/28/2017  Decreased Interest 0 1 0 0 0 0 0  Down, Depressed, Hopeless 0 - 0 0 0 0 0  PHQ - 2 Score 0 1 0 0 0 0 0  Altered sleeping - 3 - 0 - - -  Tired, decreased energy - 3 - 0 - - -  Change in appetite - 1 - 0 - - -  Feeling bad or failure  about yourself  - 0 - 0 - - -  Trouble concentrating - 2 - 0 - - -  Moving slowly or fidgety/restless - 3 - 0 - - -  Suicidal thoughts - 0 - 0 - - -  PHQ-9 Score - 13 - 0 - - -  Difficult doing work/chores - Somewhat difficult - Not difficult at all - - -    Review of Systems  Constitutional: Positive for unexpected weight change.  HENT: Negative.   Eyes: Negative.   Respiratory: Negative.   Cardiovascular: Negative.   Gastrointestinal: Negative.   Endocrine: Negative.   Genitourinary: Negative.   Musculoskeletal: Negative.   Skin: Positive for rash.  Allergic/Immunologic: Negative.   Neurological: Negative.   Hematological: Negative.   Psychiatric/Behavioral: Negative.   All other systems reviewed and are negative.      Objective:   Physical Exam General: No acute distress HEENT: EOMI, oral membranes moist Cards: reg rate  Chest: normal effort Abdomen: Soft, NT, ND Skin: dry, intact Extremities: no edema  Neuro:functional balance, normal CN, strength 5/5 Musculoskeletal:Wide-based gait.  Uses cane for balance.  Shoulder and low back range of motion remains limited. Psych:pleasant and cooperative as always       Assessment & Plan:  1. Chronic pain syndrome consistent with fibromyalgia  2. Peripheral neuropathy of unclear origin 3. Hx of lumbar spine pain with radiculitis, previous SCS.(still has unit implanted--non-functional) 4. 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 Hx of left RTC injury and surgery x 3 6. Migraine headaches with aura--increased over last few months     Plan: 1. Endocrine:  -Testosterone and vitamin D supplement - continue testosterone150mg  every 2 weeks - thyroid supplementation at same dose.  -Continueadderall 20mg  daily #30.  Refills given today for this month and next. .  2. continue with HEP, physical activity as we have  described.   -Discussed importance of stress management. 3.continuemethadone. Continueimitrex  -methadone refilled, #66 total.  -We will continue the controlled substance monitoring program, this consists of regular clinic visits, examinations, routine drug screening, pill counts as well as use of [place] Controlled Substance Reporting System. [place] today.                   -Medication was  refilled and a second prescription was sent to the patient's pharmacy for next month.   4.Increase topamax to 50-100mg  BID, titration schedule was written   --maintain headache records.  -stopped aimovig 5. 74minutesof face to face patient care time were spent during this visit. All questions were encouraged and answered. Follow up in 14months with me.

## 2018-10-09 NOTE — Patient Instructions (Signed)
TOPAMAX:  50MG  IN AM AND 100MG  IN PM FOR 2 WEEKS  YOU MAY INCREASE TO 100MG  TWICE DAILY THEREAFTER IF HEADACHES ARE PERSISTENT

## 2018-11-21 ENCOUNTER — Telehealth: Payer: Self-pay | Admitting: Physical Medicine & Rehabilitation

## 2018-11-21 DIAGNOSIS — M797 Fibromyalgia: Secondary | ICD-10-CM

## 2018-11-21 NOTE — Telephone Encounter (Signed)
Recieved electronic medication refill request for trazodone medication.  Last time reordered for patient was in April of 2019.  No mention in any previous notes since.  Please advise.

## 2018-11-23 ENCOUNTER — Telehealth: Payer: Self-pay

## 2018-11-23 NOTE — Telephone Encounter (Signed)
Patient called stating he is concerned about coming out because of the coronavirus and wanted to move his appt out a month. He is scheduled for 12-05-2018. Please advice.

## 2018-11-23 NOTE — Telephone Encounter (Signed)
We can move it to next month.

## 2018-11-27 ENCOUNTER — Telehealth: Payer: Self-pay

## 2018-11-27 DIAGNOSIS — G950 Syringomyelia and syringobulbia: Secondary | ICD-10-CM

## 2018-11-27 DIAGNOSIS — G894 Chronic pain syndrome: Secondary | ICD-10-CM

## 2018-11-27 DIAGNOSIS — Z5181 Encounter for therapeutic drug level monitoring: Secondary | ICD-10-CM

## 2018-11-27 DIAGNOSIS — Z79899 Other long term (current) drug therapy: Secondary | ICD-10-CM

## 2018-11-27 DIAGNOSIS — M797 Fibromyalgia: Secondary | ICD-10-CM

## 2018-11-27 NOTE — Telephone Encounter (Signed)
Patient called requesting a call back in regards to his medication.  Called him back, no answer, left message to return call.

## 2018-11-28 MED ORDER — METHADONE HCL 10 MG PO TABS: 10.0000 mg | ORAL_TABLET | Freq: Two times a day (BID) | ORAL | 0 refills | Status: DC

## 2018-11-28 MED ORDER — AMPHETAMINE-DEXTROAMPHETAMINE 20 MG PO TABS: 20.0000 mg | ORAL_TABLET | Freq: Every day | ORAL | 0 refills | Status: DC

## 2018-11-28 NOTE — Telephone Encounter (Signed)
RF's sent with DNF dates of 3/31

## 2018-11-28 NOTE — Telephone Encounter (Signed)
Patient called stating medication has not been sent to his pharmacy. Appointment has been rescheduled to 01/08/2019 due to Coronavirus. He last filled Methadone and Adderall on 11/07/2018. Need medication sent into pharmacy.

## 2018-11-28 NOTE — Telephone Encounter (Addendum)
Notified. 

## 2018-12-05 ENCOUNTER — Ambulatory Visit: Payer: Medicare Other | Admitting: Physical Medicine & Rehabilitation

## 2019-01-08 ENCOUNTER — Encounter: Payer: Medicare Other | Attending: Physical Medicine & Rehabilitation | Admitting: Physical Medicine & Rehabilitation

## 2019-01-08 ENCOUNTER — Encounter: Payer: Self-pay | Admitting: Physical Medicine & Rehabilitation

## 2019-01-08 ENCOUNTER — Other Ambulatory Visit: Payer: Self-pay

## 2019-01-08 VITALS — BP 121/82 | HR 61 | Temp 97.3°F | Ht 68.5 in | Wt 218.3 lb

## 2019-01-08 DIAGNOSIS — Z79899 Other long term (current) drug therapy: Secondary | ICD-10-CM | POA: Diagnosis not present

## 2019-01-08 DIAGNOSIS — Z5181 Encounter for therapeutic drug level monitoring: Secondary | ICD-10-CM | POA: Diagnosis not present

## 2019-01-08 DIAGNOSIS — M797 Fibromyalgia: Secondary | ICD-10-CM

## 2019-01-08 DIAGNOSIS — G8929 Other chronic pain: Secondary | ICD-10-CM | POA: Insufficient documentation

## 2019-01-08 DIAGNOSIS — G894 Chronic pain syndrome: Secondary | ICD-10-CM | POA: Diagnosis not present

## 2019-01-08 DIAGNOSIS — M545 Low back pain: Secondary | ICD-10-CM | POA: Insufficient documentation

## 2019-01-08 DIAGNOSIS — G43109 Migraine with aura, not intractable, without status migrainosus: Secondary | ICD-10-CM

## 2019-01-08 DIAGNOSIS — G950 Syringomyelia and syringobulbia: Secondary | ICD-10-CM

## 2019-01-08 MED ORDER — METHADONE HCL 10 MG PO TABS: 10.0000 mg | ORAL_TABLET | Freq: Two times a day (BID) | ORAL | 0 refills | Status: DC

## 2019-01-08 MED ORDER — AMPHETAMINE-DEXTROAMPHETAMINE 20 MG PO TABS: 20.0000 mg | ORAL_TABLET | Freq: Every day | ORAL | 0 refills | Status: DC

## 2019-01-08 MED ORDER — TOPIRAMATE 100 MG PO TABS: 100.0000 mg | ORAL_TABLET | Freq: Every day | ORAL | 2 refills | Status: DC

## 2019-01-08 NOTE — Progress Notes (Signed)
Subjective:    Patient ID: Derrick Johnson, male    DOB: February 03, 1956, 63 y.o.   MRN: [id number]  HPI   Due to national recommendations of social distancing because of COVID 40, an audio/video tele-health visit is felt to be the most appropriate encounter for this patient at this time. See MyChart message from today for the patient's consent to a tele-health encounter with [place]. This is a follow up tele-visit via Webex. The patient is at home. MD is at office.    I am meeting with the patient today regarding his chronic pain. He states that he actually has been feeling a little better.  Being outside more and with things slowing down, he feels that those things have helped his pain. topamax 100mg  bid seems to be decreasing migraines also  Pain Inventory Average Pain 5 Pain Right Now 5 My pain is intermittent, sharp, burning, dull, stabbing, tingling and aching  In the last 24 hours, has pain interfered with the following? General activity 3 Relation with others 3 Enjoyment of life 3 What TIME of day is your pain at its worst? all Sleep (in general) Poor  Pain is worse with: walking, bending, sitting, inactivity, standing, unsure and some activites Pain improves with: heat/ice and medication Relief from Meds: 5  Mobility walk without assistance use a cane ability to climb steps?  yes do you drive?  yes  Function disabled: date disabled 2004  Neuro/Psych numbness tremor tingling trouble walking spasms dizziness  Prior Studies Any changes since last visit?  no  Physicians involved in your care Any changes since last visit?  no   Family History  Problem Relation Age of Onset  . Hyperlipidemia Mother   . Heart disease Father   . Thyroid disease Neg Hx    Social History   Socioeconomic History  . Marital status: Married    Spouse name: Not on file  . Number of children: Not on file  . Years of education: Not on file  .  Highest education level: Not on file  Occupational History  . Not on file  Social Needs  . Financial resource strain: Not on file  . Food insecurity:    Worry: Not on file    Inability: Not on file  . Transportation needs:    Medical: Not on file    Non-medical: Not on file  Tobacco Use  . Smoking status: Never Smoker  . Smokeless tobacco: Never Used  Substance and Sexual Activity  . Alcohol use: Not on file  . Drug use: No  . Sexual activity: Not on file  Lifestyle  . Physical activity:    Days per week: Not on file    Minutes per session: Not on file  . Stress: Not on file  Relationships  . Social connections:    Talks on phone: Not on file    Gets together: Not on file    Attends religious service: Not on file    Active member of club or organization: Not on file    Attends meetings of clubs or organizations: Not on file    Relationship status: Not on file  Other Topics Concern  . Not on file  Social History Narrative  . Not on file   Past Surgical History:  Procedure Laterality Date  . CRYO INTERCOSTAL NERVE BLOCK    . maxofacial surgery    . ROTATOR CUFF REPAIR Left   . SPINAL CORD STIMULATOR IMPLANT    .  SPINE SURGERY    . TMJ ARTHROSCOPY     Past Medical History:  Diagnosis Date  . Back contusion   . Chronic migraine   . Chronic pain   . 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 Fibromyalgia   . Stroke ([place])    BP 121/82   Pulse 61   Temp (!) 97.3 F (36.3 C)   Ht 5' 8.5" (1.74 m)   Wt 218 lb 4.8 oz (99 kg)   BMI 32.71 kg/m   Opioid Risk Score:   Fall Risk Score:  `1  Depression screen PHQ 2/9  Depression screen [facility] 2/9 10/09/2018 02/23/2018 02/20/2018 12/19/2017 08/01/2017 06/28/2017 03/28/2017  Decreased Interest 0 1 0 0 0 0 0  Down, Depressed, Hopeless 0 - 0 0 0 0 0  PHQ - 2 Score 0 1 0 0 0 0 0  Altered sleeping - 3 - 0 - - -  Tired, decreased energy - 3 - 0 - - -  Change in  appetite - 1 - 0 - - -  Feeling bad or failure about yourself  - 0 - 0 - - -  Trouble concentrating - 2 - 0 - - -  Moving slowly or fidgety/restless - 3 - 0 - - -  Suicidal thoughts - 0 - 0 - - -  PHQ-9 Score - 13 - 0 - - -  Difficult doing work/chores - Somewhat difficult - Not difficult at all - - -      Review of Systems  Constitutional: Negative.   HENT: Negative.   Eyes: Negative.   Respiratory: Negative.   Cardiovascular: Negative.   Gastrointestinal: Negative.   Endocrine: Negative.   Genitourinary: Negative.   Musculoskeletal: Positive for arthralgias, back pain, gait problem and myalgias.  Skin: Negative.   Allergic/Immunologic: Negative.   Neurological: Positive for dizziness and numbness.  Hematological: Negative.   Psychiatric/Behavioral: Negative.   All other systems reviewed and are negative.          Assessment & Plan:  1. Chronic pain syndrome consistent with fibromyalgia  2. Peripheral neuropathy of unclear origin 3. Hx of lumbar spine pain with radiculitis, previous SCS.(still has unit implanted--non-functional) 4. 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 Hx of left RTC injury and surgery x 3 6. Migraine headaches with aura--increased over last few months     Plan: 1. Endocrine:  -Testosterone and vitamin D supplement - continue testosterone150mg  every 2 weeks - thyroid supplementation at same dose.  -Continueadderall 20mg  daily #30.  Refills given today for this month and next. .  2. continue with HEP, physical activity as we have described.              -Discussed importance of stress management. 3.continuemethadone. Continueimitrex  -methadone refilled, #66 total.  -We will continue the controlled substance monitoring program, this consists of regular clinic visits, examinations, routine drug screening, pill counts  as well as use of [place] Controlled Substance Reporting System. [place] today.   -Medication was refilled and a second prescription was sent to the patient's pharmacy for next month.   4.Increase topamax to 50-100mg  BID, titration schedule was written   --maintain headache records. -stopped aimovig 5. 68minutesof face to face patient care time were spent during this visit. All questions were encouraged and answered. Follow up in 87months with me.

## 2019-01-09 ENCOUNTER — Telehealth: Payer: Self-pay | Admitting: *Deleted

## 2019-01-09 DIAGNOSIS — G43109 Migraine with aura, not intractable, without status migrainosus: Secondary | ICD-10-CM

## 2019-01-09 NOTE — Telephone Encounter (Signed)
Derrick Johnson called and is questioning about his topamax dose after his visit.  He was thinking it was bid but the rx only dispense #30.  Please advise

## 2019-01-10 MED ORDER — TOPIRAMATE 100 MG PO TABS: 100.0000 mg | ORAL_TABLET | Freq: Two times a day (BID) | ORAL | 2 refills | Status: DC

## 2019-01-10 NOTE — Telephone Encounter (Signed)
rx change made.

## 2019-01-21 ENCOUNTER — Other Ambulatory Visit: Payer: Self-pay | Admitting: Physical Medicine & Rehabilitation

## 2019-01-21 DIAGNOSIS — R5382 Chronic fatigue, unspecified: Secondary | ICD-10-CM

## 2019-01-21 DIAGNOSIS — G894 Chronic pain syndrome: Secondary | ICD-10-CM

## 2019-03-05 ENCOUNTER — Other Ambulatory Visit: Payer: Self-pay | Admitting: Physical Medicine & Rehabilitation

## 2019-03-05 ENCOUNTER — Encounter: Payer: Medicare Other | Attending: Physical Medicine & Rehabilitation | Admitting: Physical Medicine & Rehabilitation

## 2019-03-05 ENCOUNTER — Encounter: Payer: Self-pay | Admitting: Physical Medicine & Rehabilitation

## 2019-03-05 ENCOUNTER — Other Ambulatory Visit: Payer: Self-pay

## 2019-03-05 VITALS — BP 152/92 | HR 73 | Temp 97.9°F | Ht 68.5 in | Wt 218.4 lb

## 2019-03-05 DIAGNOSIS — Z79891 Long term (current) use of opiate analgesic: Secondary | ICD-10-CM

## 2019-03-05 DIAGNOSIS — Z79899 Other long term (current) drug therapy: Secondary | ICD-10-CM | POA: Diagnosis not present

## 2019-03-05 DIAGNOSIS — Z5181 Encounter for therapeutic drug level monitoring: Secondary | ICD-10-CM

## 2019-03-05 DIAGNOSIS — G8929 Other chronic pain: Secondary | ICD-10-CM | POA: Diagnosis not present

## 2019-03-05 DIAGNOSIS — M797 Fibromyalgia: Secondary | ICD-10-CM

## 2019-03-05 DIAGNOSIS — M545 Low back pain: Secondary | ICD-10-CM | POA: Diagnosis not present

## 2019-03-05 DIAGNOSIS — G894 Chronic pain syndrome: Secondary | ICD-10-CM | POA: Diagnosis not present

## 2019-03-05 DIAGNOSIS — G950 Syringomyelia and syringobulbia: Secondary | ICD-10-CM | POA: Diagnosis not present

## 2019-03-05 MED ORDER — METHADONE HCL 10 MG PO TABS: 10.0000 mg | ORAL_TABLET | Freq: Two times a day (BID) | ORAL | 0 refills | Status: DC

## 2019-03-05 MED ORDER — SUMATRIPTAN SUCCINATE 100 MG PO TABS: 100.0000 mg | ORAL_TABLET | ORAL | 3 refills | Status: DC

## 2019-03-05 MED ORDER — AMPHETAMINE-DEXTROAMPHETAMINE 20 MG PO TABS: 20.0000 mg | ORAL_TABLET | Freq: Every day | ORAL | 0 refills | Status: DC

## 2019-03-05 NOTE — Patient Instructions (Signed)
PLEASE FEEL FREE TO CALL OUR OFFICE WITH ANY PROBLEMS OR QUESTIONS ([phone])      

## 2019-03-05 NOTE — Progress Notes (Signed)
Subjective:    Patient ID: Derrick Johnson, male    DOB: [birth year], 63 y.o.   MRN: [id number]  HPI Derrick Johnson is here in follow up of his chronic pain. His pain levels have been up and down. He tries to stay activte as possible at home. He has stopped using his cane. He has noticed more headaches with the labile weather but imitrex seems to work (although he used more).   For pain he's taking methadone which keeps him mobile. He's on adderall energy and attention.    Pain Inventory Average Pain 6 Pain Right Now 5 My pain is constant, sharp, burning, dull, stabbing, tingling and aching  In the last 24 hours, has pain interfered with the following? General activity 9 Relation with others 9 Enjoyment of life 10 What TIME of day is your pain at its worst? night Sleep (in general) Poor  Pain is worse with: walking, bending, sitting, inactivity, standing and some activites Pain improves with: medication Relief from Meds: 4  Mobility use a cane use a walker ability to climb steps?  yes do you drive?  yes  Function disabled: date disabled 08/2003 I need assistance with the following:  meal prep, household duties and shopping  Neuro/Psych numbness tremor tingling trouble walking spasms dizziness  Prior Studies Any changes since last visit?  no  Physicians involved in your care Any changes since last visit?  no   Family History  Problem Relation Age of Onset  . Hyperlipidemia Mother   . Heart disease Father   . Thyroid disease Neg Hx    Social History   Socioeconomic History  . Marital status: Married    Spouse name: Not on file  . Number of children: Not on file  . Years of education: Not on file  . Highest education level: Not on file  Occupational History  . Not on file  Social Needs  . Financial resource strain: Not on file  . Food insecurity    Worry: Not on file    Inability: Not on file  . Transportation needs    Medical: Not on file   Non-medical: Not on file  Tobacco Use  . Smoking status: Never Smoker  . Smokeless tobacco: Never Used  Substance and Sexual Activity  . Alcohol use: Not on file  . Drug use: No  . Sexual activity: Not on file  Lifestyle  . Physical activity    Days per week: Not on file    Minutes per session: Not on file  . Stress: Not on file  Relationships  . Social Herbalist on phone: Not on file    Gets together: Not on file    Attends religious service: Not on file    Active member of club or organization: Not on file    Attends meetings of clubs or organizations: Not on file    Relationship status: Not on file  Other Topics Concern  . Not on file  Social History Narrative  . Not on file   Past Surgical History:  Procedure Laterality Date  . CRYO INTERCOSTAL NERVE BLOCK    . maxofacial surgery    . ROTATOR CUFF REPAIR Left   . SPINAL CORD STIMULATOR IMPLANT    . SPINE SURGERY    . TMJ ARTHROSCOPY     Past Medical History:  Diagnosis Date  . Back contusion   . Chronic migraine   . Chronic pain   . Dissection of  cerebral artery ([place])   . 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 Fibromyalgia   . Stroke ([place])    BP (!) 152/92   Pulse 73   Temp 97.9 F (36.6 C)   Ht 5' 8.5" (1.74 m)   Wt 218 lb 6.4 oz (99.1 kg)   SpO2 97%   BMI 32.72 kg/m   Opioid Risk Score:   Fall Risk Score:  `1  Depression screen PHQ 2/9  Depression screen [facility] 2/9 10/09/2018 02/23/2018 02/20/2018 12/19/2017 08/01/2017 06/28/2017 03/28/2017  Decreased Interest 0 1 0 0 0 0 0  Down, Depressed, Hopeless 0 - 0 0 0 0 0  PHQ - 2 Score 0 1 0 0 0 0 0  Altered sleeping - 3 - 0 - - -  Tired, decreased energy - 3 - 0 - - -  Change in appetite - 1 - 0 - - -  Feeling bad or failure about yourself  - 0 - 0 - - -  Trouble concentrating - 2 - 0 - - -  Moving slowly or fidgety/restless - 3 - 0 - - -  Suicidal thoughts - 0 - 0 - - -  PHQ-9 Score - 13 - 0 - - -  Difficult doing  work/chores - Somewhat difficult - Not difficult at all - - -    Review of Systems  Constitutional: Positive for unexpected weight change.  HENT: Negative.   Eyes: Negative.   Respiratory: Positive for shortness of breath and wheezing.   Cardiovascular: Negative.   Gastrointestinal: Positive for constipation and nausea.  Endocrine: Negative.   Genitourinary: Negative.   Musculoskeletal: Positive for gait problem.  Skin: Negative.   Allergic/Immunologic: Negative.   Neurological: Positive for dizziness, tremors and numbness.  Hematological: Negative.   Psychiatric/Behavioral: Negative.   All other systems reviewed and are negative.      Objective:   Physical Exam   General: No acute distress HEENT: EOMI, oral membranes moist Cards: reg rate  Chest: normal effort Abdomen: Soft, NT, ND Skin: dry, intact Extremities: no edema Neuro: Pt is cognitively appropriate with normal insight, memory, and awareness. Cranial nerves 2-12 are intact. Sensory exam is normal. Reflexes are 2+ in all 4's. Fine motor coordination is intact. No tremors. Motor function is grossly 5/5.  Musculoskeletal: low back tender with limited ROM. Wide based gait. Psych: Pt's affect is appropriate. Pt is cooperative        Assessment & Plan:  1. Chronic pain syndrome consistent with fibromyalgia  2. Peripheral neuropathy of unclear origin 3. Hx of lumbar spine pain with radiculitis, previous SCS.(still has unit implanted--non-functional) 4. 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 Hx of left RTC injury and surgery x 3 6. Migraine headaches with aura--increased more recently. Continue imitrex     Plan: 1. Endocrine:  -Testosterone, thyroid, and vitamin D supplement - continue testosterone150mg  every 2 weeks - thyroid supplementation at same dose.  -Continueadderall 20mg  daily #30. RF with second for next month. 2. continue  with HEP, physical activity as we have described. -Discussed importance of stress management. 3.continuemethadone. Continueimitrex  -methadone 10mg , #66 total.  We will continue the controlled substance monitoring program, this consists of regular clinic visits, examinations, routine drug screening, pill counts as well as use of [place] Controlled Substance Reporting System. [place].     Medication was refilled and a second prescription was sent to the patient's pharmacy for next month.    -  UDS today  4.Continuetopamax  100mg BID --maintainheadache records.   5.51minutesof face to face patient care time were spent during this visit. All questions were encouraged and answered. Follow up in 2 months with NP

## 2019-03-08 LAB — TOXASSURE SELECT,+ANTIDEPR,UR

## 2019-03-12 ENCOUNTER — Telehealth: Payer: Self-pay | Admitting: *Deleted

## 2019-03-12 NOTE — Telephone Encounter (Signed)
Urine drug screen for this encounter is consistent for prescribed medication 

## 2019-04-02 NOTE — Progress Notes (Signed)
Subjective:    Patient ID: Derrick Johnson, male    DOB: 1956/05/16, 63 y.o.   MRN: [id number]  HPI: 10/17/2016 OV: Derrick Johnson present to establish as a new pt. He has extensive hx of chronic pain, numerous musculoskeletal surgeries, and managed by pain clinic.  He reports taking medication as directed, and denies SE.  He drinks water all day, however still experiences "constant dry mouth".  He reports trying "every alternative pain control" measure and now pain is managed by Dr[facility].  He denies acute cardiac sx's.  He denies thoughts of harming himself/others, denies depression.  He lives with his wife and has "an amazing support system of family and friends".   Recommended to f/u in 6 months  04/03/2019 OV: Derrick Johnson presents for f/u- this is the first OV since establishing in Feb 2018. PMH: Chronic Pain-Methadone Use, Low Testosterone, Hyperthyroidism (referred to Dr. Cammy Copa 11/2016) , Migraines, ADHD, Asthma, Seasonal Allergies He is followed by Phys Med for chronic Pain and most other of his chronic conditions-  He initially established with Phys Med/Dr. Swartz/04/2016 Methadone always him to remain mobile, is now able to ambulate without any assistive devices, last was a cane. Adderall 20mg  QD- improves his focus/concentration/energy levels. He reports migraines will increase with heavy rainfall, rise/fall in barometric pressure. He is on Topiramate 100mg  BID for mx and Imitrex for abortive treatment. He denies tobacco/vape/ETOH use He has been on disability since 2004, but is quite vague about the circumstances. 2003- he suffered an injury when a ladder collapsed-ed to a vertebral artery dissection and subsequent bilateral cerebellar infarcts-treated by Dr. Marney Setting.  [place] [facility] has performed L rotator cuff repair x 3- he still reports L shoulder pain/immobility  He reports drinking water throughout the day and remains as active as possible, limited by  pain of low back and "entire body".   Discussed what conditions primary care will managed- BP, Cholesterol, Thyroid, Weight, Nutrition, Routine Labs, Managing acute issues. He will need to continue wit Phys Med for pain management, ADHD (since his is on controlled substance contract) and testosterone replacement (also controlled substance and he did state that he had seen a Urologist in the past, however it was years ago).  He lives with his wife and pets He reports stable mood, denies SI/HI   Patient Care Team    Relationship Specialty Notifications Start End  Aten, Valetta Fuller D, NP PCP - General Family Medicine  10/17/16   Meredith Staggers, MD Consulting Physician Physical Medicine and Rehabilitation  10/17/16   Bayard Hugger, NP Nurse Practitioner Physical Medicine and Rehabilitation  10/17/16     Patient Active Problem List   Diagnosis Date Noted  . Healthcare maintenance 04/03/2019  . Chronic left shoulder pain 12/19/2017  . Testosterone insufficiency 10/25/2017  . Encounter for therapeutic drug monitoring 10/25/2017  . Migraine with aura, not intractable 08/01/2017  . Dyslipidemia (high LDL; low HDL) 01/02/2017  . Hyperthyroidism 11/14/2016  . Insomnia 10/17/2016  . ADHD (attention deficit hyperactivity disorder) 10/17/2016  . Chronic pain syndrome 05/02/2016  . 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 Fatigue 05/02/2016  . Fibromyalgia 05/02/2016     Past Medical History:  Diagnosis Date  . Back contusion   . Chronic migraine   . Chronic pain   . Dissection of cerebral artery ([place])   . 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 Fibromyalgia   . Stroke [facility])  Past Surgical History:  Procedure Laterality Date  . CRYO INTERCOSTAL NERVE BLOCK    . maxofacial surgery    . ROTATOR CUFF REPAIR Left   . SPINAL CORD STIMULATOR IMPLANT    . SPINE SURGERY    . TMJ ARTHROSCOPY       Family History  Problem Relation Age of Onset  . Hyperlipidemia  Mother   . Heart disease Father   . Thyroid disease Neg Hx      Social History   Substance and Sexual Activity  Drug Use No     Social History   Substance and Sexual Activity  Alcohol Use None     Social History   Tobacco Use  Smoking Status Never Smoker  Smokeless Tobacco Never Used     Outpatient Encounter Medications as of 04/03/2019  Medication Sig  . albuterol (VENTOLIN HFA) 108 (90 Base) MCG/ACT inhaler Inhale 1-2 puffs into the lungs every 6 (six) hours as needed for wheezing or shortness of breath.  . amphetamine-dextroamphetamine (ADDERALL) 20 MG tablet Take 1 tablet (20 mg total) by mouth daily.  Marland Kitchen levothyroxine (SYNTHROID) 25 MCG tablet TAKE 1 TABLET BY MOUTH DAILY BEFORE BREAKFAST  . methadone (DOLOPHINE) 10 MG tablet Take 1-2 tablets (10-20 mg total) by mouth every 12 (twelve) hours.  . Multiple Vitamin (MULTIVITAMIN) capsule Take 1 capsule by mouth daily.  . polyethylene glycol (MIRALAX / GLYCOLAX) 17 g packet Take 17 g by mouth daily.  . SUMAtriptan (IMITREX) 100 MG tablet Take 1 tablet (100 mg total) by mouth as directed. May repeat in 2 hours if headache persists or recurs.  Marland Kitchen testosterone cypionate (DEPOTESTOSTERONE CYPIONATE) 200 MG/ML injection Inject 0.75 mLs (150 mg total) into the muscle every 14 (fourteen) days.  Marland Kitchen topiramate (TOPAMAX) 100 MG tablet Take 1 tablet (100 mg total) by mouth 2 (two) times daily.  . traZODone (DESYREL) 150 MG tablet TAKE 1 TABLET BY MOUTH DAILY  . VITAMIN D-VITAMIN K PO Take 1 capsule by mouth daily. 10,000IU/ 100 mcg  . [DISCONTINUED] cetirizine (ZYRTEC) 10 MG tablet Take 10 mg by mouth daily.  . [DISCONTINUED] docusate sodium (COLACE) 250 MG capsule Take 250 mg by mouth daily.  . [DISCONTINUED] SENNOSIDES PO Take 1 tablet by mouth 2 (two) times daily.   No facility-administered encounter medications on file as of 04/03/2019.     Allergies: Penicillins  Body mass index is 34.22 kg/m.  Blood pressure (!) 148/90,  pulse 69, temperature 98.7 F (37.1 C), temperature source Oral, height 5' 8.5" (1.74 m), weight 228 lb 6.4 oz (103.6 kg), SpO2 97 %.     Review of Systems  Constitutional: Positive for fatigue. Negative for activity change, appetite change, chills, diaphoresis, fever and unexpected weight change.  Eyes: Negative for visual disturbance.  Respiratory: Negative for cough, chest tightness, shortness of breath, wheezing and stridor.   Cardiovascular: Negative for chest pain, palpitations and leg swelling.  Gastrointestinal: Negative for abdominal distention, anal bleeding, blood in stool, constipation, diarrhea, nausea and vomiting.  Endocrine: Negative for cold intolerance, heat intolerance, polydipsia, polyphagia and polyuria.  Genitourinary: Negative for difficulty urinating and flank pain.  Musculoskeletal: Positive for arthralgias, back pain, gait problem, joint swelling, myalgias, neck pain and neck stiffness.  Skin: Negative for color change, pallor, rash and wound.  Neurological: Positive for headaches. Negative for dizziness.  Hematological: Negative for adenopathy. Does not bruise/bleed easily.  Psychiatric/Behavioral: Positive for decreased concentration, self-injury and sleep disturbance. Negative for agitation, behavioral problems, confusion, dysphoric mood, hallucinations and  suicidal ideas. The patient is nervous/anxious. The patient is not hyperactive.        Objective:   Physical Exam Vitals signs and nursing note reviewed.  Constitutional:      General: He is not in acute distress.    Appearance: He is obese. He is not ill-appearing, toxic-appearing or diaphoretic.  HENT:     Head: Normocephalic and atraumatic.  Eyes:     Extraocular Movements: Extraocular movements intact.     Conjunctiva/sclera: Conjunctivae normal.     Pupils: Pupils are equal, round, and reactive to light.  Cardiovascular:     Rate and Rhythm: Normal rate and regular rhythm.     Pulses: Normal  pulses.     Heart sounds: Normal heart sounds. No murmur. No friction rub. No gallop.   Pulmonary:     Effort: Pulmonary effort is normal. No respiratory distress.     Breath sounds: Normal breath sounds. No stridor. No wheezing, rhonchi or rales.  Chest:     Chest wall: No tenderness.  Skin:    Capillary Refill: Capillary refill takes less than 2 seconds.  Neurological:     Mental Status: He is alert and oriented to person, place, and time.  Psychiatric:        Mood and Affect: Mood normal.        Behavior: Behavior normal.        Thought Content: Thought content normal.        Judgment: Judgment normal.       Assessment & Plan:   1. Fatigue, unspecified type   2. Hyperthyroidism   3. Healthcare maintenance   4. Dyslipidemia (high LDL; low HDL)   5. Migraine with aura and without status migrainosus, not intractable   6. Testosterone insufficiency   7. Insomnia due to medical condition   8. Chronic pain syndrome   9. Attention deficit hyperactivity disorder (ADHD), other type     Healthcare maintenance Continue all medications as directed. Remain well hydrated- strive to drink half your body weight in ounces of water per day. Follow Mediterranean diet and remain as active as possible. We will call you when lab results are available. Continue with Phys Med as directed. Please schedule complete physical in 2 months and come fasting- will check cholesterol levels then. Continue to social distance and wear a mask when in public.  Migraine with aura, not intractable He reports migraines will increase with heavy rainfall, rise/fall in barometric pressure. He is on Topiramate 100mg  BID for mx and Imitrex for abortive treatment. He denies tobacco/vape/ETOH use  Hyperthyroidism Hyperthyroidism (referred to Dr. Cammy Copa 11/2016) , Currently on Levothyroxine 45mcg QD  Testosterone insufficiency Discussed that this will not be managed at this clinic- continue with Phys  Med  Insomnia Trazodone 150mg  QD  Chronic pain syndrome Methadone (Dolophine) 10mg  1-2 tablets  [place] Controlled Substance Database reviewed- he is filling every 4 weeks  ADHD (attention deficit hyperactivity disorder) Adderall 20mg  QD [place] Controlled Substance Database reviewed- he is filling every 4 weeks    FOLLOW-UP:  Return in about 2 months (around 06/04/2019) for CPE.

## 2019-04-03 ENCOUNTER — Encounter: Payer: Self-pay | Admitting: Adult Health

## 2019-04-03 ENCOUNTER — Other Ambulatory Visit: Payer: Self-pay

## 2019-04-03 ENCOUNTER — Ambulatory Visit (INDEPENDENT_AMBULATORY_CARE_PROVIDER_SITE_OTHER): Payer: Medicare Other | Admitting: Adult Health

## 2019-04-03 VITALS — BP 148/90 | HR 69 | Temp 98.7°F | Ht 68.5 in | Wt 228.4 lb

## 2019-04-03 DIAGNOSIS — R5383 Other fatigue: Secondary | ICD-10-CM

## 2019-04-03 DIAGNOSIS — G43109 Migraine with aura, not intractable, without status migrainosus: Secondary | ICD-10-CM

## 2019-04-03 DIAGNOSIS — Z0000 Encounter for general adult medical examination without abnormal findings: Secondary | ICD-10-CM | POA: Diagnosis not present

## 2019-04-03 DIAGNOSIS — E349 Endocrine disorder, unspecified: Secondary | ICD-10-CM | POA: Diagnosis not present

## 2019-04-03 DIAGNOSIS — G4701 Insomnia due to medical condition: Secondary | ICD-10-CM

## 2019-04-03 DIAGNOSIS — E0590 Thyrotoxicosis, unspecified without thyrotoxic crisis or storm: Secondary | ICD-10-CM

## 2019-04-03 DIAGNOSIS — G894 Chronic pain syndrome: Secondary | ICD-10-CM | POA: Diagnosis not present

## 2019-04-03 DIAGNOSIS — E785 Hyperlipidemia, unspecified: Secondary | ICD-10-CM | POA: Diagnosis not present

## 2019-04-03 DIAGNOSIS — F908 Attention-deficit hyperactivity disorder, other type: Secondary | ICD-10-CM

## 2019-04-03 NOTE — Patient Instructions (Addendum)
Mediterranean Diet A Mediterranean diet refers to food and lifestyle choices that are based on the traditions of countries located on the [employer]. This way of eating has been shown to help prevent certain conditions and improve outcomes for people who have chronic diseases, like kidney disease and heart disease. What are tips for following this plan? Lifestyle  Cook and eat meals together with your family, when possible.  Drink enough fluid to keep your urine clear or pale yellow.  Be physically active every day. This includes: ? Aerobic exercise like running or swimming. ? Leisure activities like gardening, walking, or housework.  Get 7-8 hours of sleep each night.  Reading food labels   Check the serving size of packaged foods. For foods such as rice and pasta, the serving size refers to the amount of cooked product, not dry.  Check the total fat in packaged foods. Avoid foods that have saturated fat or trans fats.  Check the ingredients list for added sugars, such as corn syrup. Shopping  At the grocery store, buy most of your food from the areas near the walls of the store. This includes: ? Fresh fruits and vegetables (produce). ? Grains, beans, nuts, and seeds. Some of these may be available in unpackaged forms or large amounts (in bulk). ? Fresh seafood. ? Poultry and eggs. ? Low-fat dairy products.  Buy whole ingredients instead of prepackaged foods.  Buy fresh fruits and vegetables in-season from local farmers markets.  Buy frozen fruits and vegetables in resealable bags.  If you do not have access to quality fresh seafood, buy precooked frozen shrimp or canned fish, such as tuna, salmon, or sardines.  Buy small amounts of raw or cooked vegetables, salads, or olives from the deli or salad bar at your store.  Stock your pantry so you always have certain foods on hand, such as olive oil, canned tuna, canned tomatoes, rice, pasta, and beans. Cooking  Cook  foods with extra-virgin olive oil instead of using butter or other vegetable oils.  Have meat as a side dish, and have vegetables or grains as your main dish. This means having meat in small portions or adding small amounts of meat to foods like pasta or stew.  Use beans or vegetables instead of meat in common dishes like chili or lasagna.  Experiment with different cooking methods. Try roasting or broiling vegetables instead of steaming or sauteing them.  Add frozen vegetables to soups, stews, pasta, or rice.  Add nuts or seeds for added healthy fat at each meal. You can add these to yogurt, salads, or vegetable dishes.  Marinate fish or vegetables using olive oil, lemon juice, garlic, and fresh herbs. Meal planning   Plan to eat 1 vegetarian meal one day each week. Try to work up to 2 vegetarian meals, if possible.  Eat seafood 2 or more times a week.  Have healthy snacks readily available, such as: ? Vegetable sticks with hummus. ? [place] yogurt. ? Fruit and nut trail mix.  Eat balanced meals throughout the week. This includes: ? Fruit: 2-3 servings a day ? Vegetables: 4-5 servings a day ? Low-fat dairy: 2 servings a day ? Fish, poultry, or lean meat: 1 serving a day ? Beans and legumes: 2 or more servings a week ? Nuts and seeds: 1-2 servings a day ? Whole grains: 6-8 servings a day ? Extra-virgin olive oil: 3-4 servings a day  Limit red meat and sweets to only a few servings a month What  are my food choices?  Mediterranean diet ? Recommended  Grains: Whole-grain pasta. Brown rice. Bulgar wheat. Polenta. Couscous. Whole-wheat bread. [place].  Vegetables: Artichokes. Beets. Broccoli. Cabbage. Carrots. Eggplant. Green beans. Chard. Kale. Spinach. Onions. Leeks. Peas. Squash. Tomatoes. Peppers. Radishes.  Fruits: Apples. Apricots. Avocado. Berries. Bananas. Cherries. Dates. Figs. Grapes. Lemons. Melon. Oranges. Peaches. Plums. Pomegranate.  Meats and other  protein foods: Beans. Almonds. Sunflower seeds. Pine nuts. Peanuts. [place]. Salmon. Scallops. Shrimp. [place]. Tilapia. Clams. Oysters. Eggs.  Dairy: Low-fat milk. Cheese. Greek yogurt.  Beverages: Water. Red wine. Herbal tea.  Fats and oils: Extra virgin olive oil. Avocado oil. Grape seed oil.  Sweets and desserts: [place] yogurt with honey. Baked apples. Poached pears. Trail mix.  Seasoning and other foods: Basil. Cilantro. Coriander. Cumin. Mint. Parsley. Sage. Rosemary. Tarragon. Garlic. Oregano. Thyme. Pepper. Balsalmic vinegar. Tahini. Hummus. Tomato sauce. Olives. Mushrooms. ? Limit these  Grains: Prepackaged pasta or rice dishes. Prepackaged cereal with added sugar.  Vegetables: Deep fried potatoes (french fries).  Fruits: Fruit canned in syrup.  Meats and other protein foods: Beef. Pork. Lamb. Poultry with skin. Hot dogs. Berniece Salines.  Dairy: Ice cream. Sour cream. Whole milk.  Beverages: Juice. Sugar-sweetened soft drinks. Beer. Liquor and spirits.  Fats and oils: Butter. Canola oil. Vegetable oil. Beef fat (tallow). Lard.  Sweets and desserts: Cookies. Cakes. Pies. Candy.  Seasoning and other foods: Mayonnaise. Premade sauces and marinades. The items listed may not be a complete list. Talk with your dietitian about what dietary choices are right for you. Summary  The Mediterranean diet includes both food and lifestyle choices.  Eat a variety of fresh fruits and vegetables, beans, nuts, seeds, and whole grains.  Limit the amount of red meat and sweets that you eat.  This information is not intended to replace advice given to you by your health care provider. Make sure you discuss any questions you have with your health care provider. Document Released: 04/14/2016 Document Revised: 04/21/2016 Document Reviewed: 04/14/2016 Elsevier Patient Education  2020 [place] all medications as directed. Remain well hydrated- strive to drink half your body weight in ounces  of water per day. Follow Mediterranean diet and remain as active as possible. We will call you when lab results are available. Continue with Phys Med as directed. Please schedule complete physical in 2 months and come fasting- will check cholesterol levels then. Continue to social distance and wear a mask when in public. NICE TO SEE YOU!

## 2019-04-03 NOTE — Assessment & Plan Note (Signed)
Continue all medications as directed. Remain well hydrated- strive to drink half your body weight in ounces of water per day. Follow Mediterranean diet and remain as active as possible. We will call you when lab results are available. Continue with Phys Med as directed. Please schedule complete physical in 2 months and come fasting- will check cholesterol levels then. Continue to social distance and wear a mask when in public.

## 2019-04-04 ENCOUNTER — Encounter: Payer: Self-pay | Admitting: Adult Health

## 2019-04-04 ENCOUNTER — Other Ambulatory Visit: Payer: Self-pay | Admitting: Physical Medicine & Rehabilitation

## 2019-04-04 DIAGNOSIS — G894 Chronic pain syndrome: Secondary | ICD-10-CM

## 2019-04-04 DIAGNOSIS — R5382 Chronic fatigue, unspecified: Secondary | ICD-10-CM

## 2019-04-04 LAB — CBC WITH DIFFERENTIAL/PLATELET
Basophils Absolute: 0.1 10*3/uL (ref 0.0–0.2)
Basos: 1 %
EOS (ABSOLUTE): 0.2 10*3/uL (ref 0.0–0.4)
Eos: 2 %
Hematocrit: 48.7 % (ref 37.5–51.0)
Hemoglobin: 16.7 g/dL (ref 13.0–17.7)
Immature Grans (Abs): 0.1 10*3/uL (ref 0.0–0.1)
Immature Granulocytes: 1 %
Lymphocytes Absolute: 1.4 10*3/uL (ref 0.7–3.1)
Lymphs: 15 %
MCH: 28.9 pg (ref 26.6–33.0)
MCHC: 34.3 g/dL (ref 31.5–35.7)
MCV: 84 fL (ref 79–97)
Monocytes Absolute: 0.9 10*3/uL (ref 0.1–0.9)
Monocytes: 9 %
Neutrophils Absolute: 6.8 10*3/uL (ref 1.4–7.0)
Neutrophils: 72 %
Platelets: 239 10*3/uL (ref 150–450)
RBC: 5.77 x10E6/uL (ref 4.14–5.80)
RDW: 13.9 % (ref 11.6–15.4)
WBC: 9.3 10*3/uL (ref 3.4–10.8)

## 2019-04-04 LAB — COMPREHENSIVE METABOLIC PANEL
ALT: 33 IU/L (ref 0–44)
AST: 33 IU/L (ref 0–40)
Albumin/Globulin Ratio: 1.8 (ref 1.2–2.2)
Albumin: 4.6 g/dL (ref 3.8–4.8)
Alkaline Phosphatase: 62 IU/L (ref 39–117)
BUN/Creatinine Ratio: 8 — ABNORMAL LOW (ref 10–24)
BUN: 8 mg/dL (ref 8–27)
Bilirubin Total: 0.5 mg/dL (ref 0.0–1.2)
CO2: 17 mmol/L — ABNORMAL LOW (ref 20–29)
Calcium: 9 mg/dL (ref 8.6–10.2)
Chloride: 105 mmol/L (ref 96–106)
Creatinine, Ser: 1 mg/dL (ref 0.76–1.27)
GFR calc Af Amer: 93 mL/min/{1.73_m2} (ref >59)
GFR calc non Af Amer: 80 mL/min/{1.73_m2} (ref >59)
Globulin, Total: 2.6 g/dL (ref 1.5–4.5)
Glucose: 111 mg/dL — ABNORMAL HIGH (ref 65–99)
Potassium: 3.6 mmol/L (ref 3.5–5.2)
Sodium: 139 mmol/L (ref 134–144)
Total Protein: 7.2 g/dL (ref 6.0–8.5)

## 2019-04-04 LAB — HEMOGLOBIN A1C
Est. average glucose Bld gHb Est-mCnc: 108 mg/dL
Hgb A1c MFr Bld: 5.4 % (ref 4.8–5.6)

## 2019-04-04 LAB — TSH: TSH: 1.67 u[IU]/mL (ref 0.450–4.500)

## 2019-04-04 NOTE — Assessment & Plan Note (Signed)
He reports migraines will increase with heavy rainfall, rise/fall in barometric pressure. He is on Topiramate 100mg  BID for mx and Imitrex for abortive treatment. He denies tobacco/vape/ETOH use

## 2019-04-04 NOTE — Assessment & Plan Note (Signed)
Methadone (Dolophine) 10mg  1-2 tablets  [place] Controlled Substance Database reviewed- he is filling every 4 weeks

## 2019-04-04 NOTE — Assessment & Plan Note (Signed)
Hyperthyroidism (referred to Dr. Cammy Copa 11/2016) , Currently on Levothyroxine 39mcg QD

## 2019-04-04 NOTE — Assessment & Plan Note (Signed)
Trazodone 150mg  QD

## 2019-04-04 NOTE — Assessment & Plan Note (Signed)
Adderall 20mg  QD [place] Controlled Substance Database reviewed- he is filling every 4 weeks

## 2019-04-04 NOTE — Assessment & Plan Note (Signed)
Discussed that this will not be managed at this clinic- continue with Phys Med

## 2019-04-09 ENCOUNTER — Other Ambulatory Visit: Payer: Self-pay

## 2019-04-09 DIAGNOSIS — G894 Chronic pain syndrome: Secondary | ICD-10-CM

## 2019-04-09 DIAGNOSIS — R5382 Chronic fatigue, unspecified: Secondary | ICD-10-CM

## 2019-04-09 MED ORDER — LEVOTHYROXINE SODIUM 25 MCG PO TABS: ORAL_TABLET | ORAL | 0 refills | Status: DC

## 2019-04-11 ENCOUNTER — Other Ambulatory Visit: Payer: Self-pay | Admitting: Physical Medicine & Rehabilitation

## 2019-04-11 DIAGNOSIS — E349 Endocrine disorder, unspecified: Secondary | ICD-10-CM

## 2019-04-15 DIAGNOSIS — Z049 Encounter for examination and observation for unspecified reason: Secondary | ICD-10-CM | POA: Diagnosis not present

## 2019-04-15 DIAGNOSIS — Z1211 Encounter for screening for malignant neoplasm of colon: Secondary | ICD-10-CM | POA: Diagnosis not present

## 2019-04-15 DIAGNOSIS — G8929 Other chronic pain: Secondary | ICD-10-CM | POA: Diagnosis not present

## 2019-04-15 DIAGNOSIS — N419 Inflammatory disease of prostate, unspecified: Secondary | ICD-10-CM | POA: Diagnosis not present

## 2019-04-15 DIAGNOSIS — R300 Dysuria: Secondary | ICD-10-CM | POA: Diagnosis not present

## 2019-04-22 DIAGNOSIS — G8929 Other chronic pain: Secondary | ICD-10-CM | POA: Diagnosis not present

## 2019-04-22 DIAGNOSIS — J45909 Unspecified asthma, uncomplicated: Secondary | ICD-10-CM | POA: Diagnosis not present

## 2019-04-23 ENCOUNTER — Telehealth: Payer: Self-pay | Admitting: *Deleted

## 2019-04-23 NOTE — Telephone Encounter (Signed)
Derrick Johnson called and his PCP who is 5 min from his house is going to resume his care.  His records have been received.  He wanted to thank Dr Naaman Plummer and Zella Ball for all their care. I will cancel his upcoming appt.

## 2019-05-01 ENCOUNTER — Encounter: Payer: Medicare Other | Admitting: Registered Nurse

## 2019-05-16 DIAGNOSIS — G8929 Other chronic pain: Secondary | ICD-10-CM | POA: Diagnosis not present

## 2019-05-27 ENCOUNTER — Other Ambulatory Visit: Payer: Medicare Other

## 2019-06-03 ENCOUNTER — Encounter: Payer: Medicare Other | Admitting: Adult Health

## 2019-06-12 DIAGNOSIS — G8929 Other chronic pain: Secondary | ICD-10-CM | POA: Diagnosis not present

## 2019-07-03 DIAGNOSIS — Z23 Encounter for immunization: Secondary | ICD-10-CM | POA: Diagnosis not present

## 2019-07-03 DIAGNOSIS — G8929 Other chronic pain: Secondary | ICD-10-CM | POA: Diagnosis not present

## 2019-07-05 DIAGNOSIS — G8929 Other chronic pain: Secondary | ICD-10-CM | POA: Diagnosis not present

## 2019-08-12 DIAGNOSIS — G8929 Other chronic pain: Secondary | ICD-10-CM | POA: Diagnosis not present

## 2019-08-14 ENCOUNTER — Other Ambulatory Visit: Payer: Self-pay | Admitting: Physical Medicine & Rehabilitation

## 2019-08-14 DIAGNOSIS — E349 Endocrine disorder, unspecified: Secondary | ICD-10-CM

## 2019-08-15 ENCOUNTER — Other Ambulatory Visit: Payer: Self-pay | Admitting: Physical Medicine & Rehabilitation

## 2019-08-15 DIAGNOSIS — E349 Endocrine disorder, unspecified: Secondary | ICD-10-CM

## 2019-10-29 ENCOUNTER — Other Ambulatory Visit: Payer: Self-pay | Admitting: Oncology

## 2019-11-06 ENCOUNTER — Telehealth: Payer: Self-pay | Admitting: Oncology

## 2019-11-06 NOTE — Telephone Encounter (Signed)
Received a new pt referral from Dr. Penelope Coop for new dx of rectal cancer. Pt has been cld and scheduled to see Dr. Benay Spice on 3/4 at 2pm. PT aware to arrive 15 minutes early.

## 2019-11-07 ENCOUNTER — Ambulatory Visit
Admission: RE | Admit: 2019-11-07 | Discharge: 2019-11-07 | Disposition: A | Discharge status: Home / Self Care | Payer: Medicare Other | Source: Ambulatory Visit | Attending: Radiation Oncology | Admitting: Radiation Oncology

## 2019-11-07 ENCOUNTER — Other Ambulatory Visit: Payer: Self-pay

## 2019-11-07 ENCOUNTER — Inpatient Hospital Stay: Payer: Medicare Other | Attending: Oncology | Admitting: Oncology

## 2019-11-07 VITALS — BP 129/86 | HR 67 | Temp 98.7°F | Resp 18 | Ht 68.5 in | Wt 218.6 lb

## 2019-11-07 DIAGNOSIS — C20 Malignant neoplasm of rectum: Secondary | ICD-10-CM | POA: Diagnosis not present

## 2019-11-07 DIAGNOSIS — R5383 Other fatigue: Secondary | ICD-10-CM | POA: Insufficient documentation

## 2019-11-07 DIAGNOSIS — M797 Fibromyalgia: Secondary | ICD-10-CM | POA: Insufficient documentation

## 2019-11-07 DIAGNOSIS — G894 Chronic pain syndrome: Secondary | ICD-10-CM | POA: Insufficient documentation

## 2019-11-07 DIAGNOSIS — J45909 Unspecified asthma, uncomplicated: Secondary | ICD-10-CM | POA: Insufficient documentation

## 2019-11-07 DIAGNOSIS — F988 Other specified behavioral and emotional disorders with onset usually occurring in childhood and adolescence: Secondary | ICD-10-CM | POA: Insufficient documentation

## 2019-11-07 DIAGNOSIS — G950 Syringomyelia and syringobulbia: Secondary | ICD-10-CM | POA: Insufficient documentation

## 2019-11-07 DIAGNOSIS — Z8673 Personal history of transient ischemic attack (TIA), and cerebral infarction without residual deficits: Secondary | ICD-10-CM | POA: Insufficient documentation
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
## 2019-11-08 ENCOUNTER — Ambulatory Visit: Payer: Medicare Other | Admitting: Radiation Oncology

## 2019-11-08 ENCOUNTER — Encounter: Payer: Self-pay | Admitting: *Deleted

## 2019-11-08 ENCOUNTER — Encounter: Payer: Self-pay | Admitting: General Practice

## 2019-11-08 ENCOUNTER — Other Ambulatory Visit: Payer: Self-pay

## 2019-11-08 DIAGNOSIS — C20 Malignant neoplasm of rectum: Secondary | ICD-10-CM

## 2019-11-08 NOTE — Progress Notes (Signed)
[facility] Initial Psychosocial Assessment Clinical Social Work  Clinical Social Work contacted by phone to assess psychosocial, emotional, mental health, and spiritual needs of the patient.   Barriers to care/review of distress screen:  - Transportation:  Do you anticipate any problems getting to appointments?  Do you have someone who can help run errands for you if you need it?  Wife can transport - Help at home:  What is your living situation (alone, family, other)?  If you are physically unable to care for yourself, who would you call on to help you?  Lives w wife. - Support system:  What does your support system look like?  Who would you call on if you needed some kind of practical help?  What if you needed someone to talk to for emotional support?  Married, no children.  Mother and mother in law both currently sick, one w cancer.   Likes to have fun, deals with difficulties by have positive, good sense of humor, takes life as it comes.  Wife's brother died of cancer 8 years ago.   - Finances:  Are you concerned about finances.  Considering returning to work?  If not, applying for disability?  On disability, insured.  Worked at [employer] til in late 40s when chronic pain issues prevented his ability to work.  Finances were easier and more manageable prior to that time.  Did get behind on bills when he was waiting for disability to be approved, but now is making it.  Major concern is cost of treatments - has Medicare Part A and B.    What is your understanding of where you are with your cancer? Its cause?  Your treatment plan and what happens next?  Has multiple medical issues - lives w "pain level of 6 - 7 every day of my life for past 20 years."  Aware that chronic pain will never go away.  "I just don't give up, I power through, this is just one more thing."  Has ADD, keeps lists and tries to keep himself organized and on track.  Struggles w chronic migraines - has done better in past year despite chronic  pain.  Frustrated w being asked the same questions "over and over" at [facility], easily exhausted.  Wishes information gathering was streamlined, especially as he has limited energy and is already exhausted by multiple appointments/tests etc.    What are your worries for the future as you begin treatment for cancer?  Wants ballpark figure of cost of treatments, does not want to leave wife penniless at his death.  "My bigest problem is going to be the energy problem, I am in bed 3 - 4 days/week, it will be difficult to get out 5 days/week for 5.5 weeks."  Determined to make every effort to get to appointments despite pain and challenges of getting to [facility].    What are your hopes and priorities during your treatment? What is important to you? What are your goals for your care?  Needs clear path for treatment, likes to make lists, know potential costs of things - "I have to have information."  Likes to logically problem solve.     What are you willing to sacrifice during your treatment?  Wants to fight, "I know I will beat this."  Values his relationship w wife and family members.  Will be making every effort to show up for all appointments despite struggles w pain and fatigue.    What are you NOT willing  to sacrifice during your treatment?  Does not want to leave wife w unnecessary debt, but does want to pursue recommended treatments as he wants to live and beat cancer.     CSW Summary:  Patient and family psychosocial functioning including strengths, limitations, and coping skills:  Patient is a 64 year old male, newly diagnosed w rectal cancer.  Is currently still undergoing work up, but aware that he will start radiation therapy in March.  Has lived w chronic pain since his late 95s, has chronic migraines, spends significant time in bed.  However, also has determination to overcome any physical and disease related barriers.  "I don't listen to the doctors when they tell me I cant do something, I will try my  best."  Gave as an example his progression from motorized wheelchair to cane to now walking without cane.  Has done his best to have a positive attitude towards his physical challenges and overcome barriers.  Deals with pain 6/10 "all the time", feels cancer is just another challenge.    Identifications of barriers to care: potential cost of cancer treatments, has Medicare Part A and B but no additional coverate  Availability of community resources:  Triage Cancer/insurance and financial information, may need to determine if he can add any coverages to his Medicare.    Clinical Social Worker follow up needed: Yes.  will call him in two weeks.  Edwyna Shell, LCSW Clinical Social Worker Phone:  [phone] Cell:  [phone]

## 2019-11-08 NOTE — Progress Notes (Signed)
Call to [facility] [phone]) requesting addendum on pathology report from case #[id number] dated 10/29/19 with MMR via IHC be faxed to [phone] or call back if there is any difficulty.

## 2019-11-12 ENCOUNTER — Encounter (HOSPITAL_COMMUNITY): Payer: Self-pay

## 2019-11-12 ENCOUNTER — Other Ambulatory Visit: Payer: Self-pay

## 2019-11-12 ENCOUNTER — Inpatient Hospital Stay: Payer: Medicare Other

## 2019-11-12 ENCOUNTER — Ambulatory Visit (HOSPITAL_COMMUNITY)
Admission: RE | Admit: 2019-11-12 | Discharge: 2019-11-12 | Disposition: A | Discharge status: Home / Self Care | Payer: Medicare Other | Source: Ambulatory Visit | Attending: Oncology | Admitting: Oncology

## 2019-11-12 DIAGNOSIS — M797 Fibromyalgia: Secondary | ICD-10-CM | POA: Diagnosis not present

## 2019-11-12 DIAGNOSIS — J45909 Unspecified asthma, uncomplicated: Secondary | ICD-10-CM | POA: Diagnosis not present

## 2019-11-12 DIAGNOSIS — C20 Malignant neoplasm of rectum: Secondary | ICD-10-CM | POA: Diagnosis present

## 2019-11-12 DIAGNOSIS — G894 Chronic pain syndrome: Secondary | ICD-10-CM | POA: Diagnosis not present

## 2019-11-12 DIAGNOSIS — R5383 Other fatigue: Secondary | ICD-10-CM | POA: Diagnosis not present

## 2019-11-12 DIAGNOSIS — F988 Other specified behavioral and emotional disorders with onset usually occurring in childhood and adolescence: Secondary | ICD-10-CM | POA: Diagnosis not present

## 2019-11-12 DIAGNOSIS — G950 Syringomyelia and syringobulbia: Secondary | ICD-10-CM | POA: Diagnosis not present

## 2019-11-12 DIAGNOSIS — Z8673 Personal history of transient ischemic attack (TIA), and cerebral infarction without residual deficits: Secondary | ICD-10-CM | POA: Diagnosis not present

## 2019-11-12 LAB — CBC WITH DIFFERENTIAL (CANCER CENTER ONLY)
Abs Immature Granulocytes: 0.05 10*3/uL (ref 0.00–0.07)
Basophils Absolute: 0.1 10*3/uL (ref 0.0–0.1)
Basophils Relative: 1 %
Eosinophils Absolute: 0.2 10*3/uL (ref 0.0–0.5)
Eosinophils Relative: 3 %
HCT: 51.1 % (ref 39.0–52.0)
Hemoglobin: 17.2 g/dL — ABNORMAL HIGH (ref 13.0–17.0)
Immature Granulocytes: 1 %
Lymphocytes Relative: 16 %
Lymphs Abs: 1.3 10*3/uL (ref 0.7–4.0)
MCH: 28.4 pg (ref 26.0–34.0)
MCHC: 33.7 g/dL (ref 30.0–36.0)
MCV: 84.5 fL (ref 80.0–100.0)
Monocytes Absolute: 0.7 10*3/uL (ref 0.1–1.0)
Monocytes Relative: 10 %
Neutro Abs: 5.4 10*3/uL (ref 1.7–7.7)
Neutrophils Relative %: 69 %
Platelet Count: 233 10*3/uL (ref 150–400)
RBC: 6.05 MIL/uL — ABNORMAL HIGH (ref 4.22–5.81)
RDW: 14.2 % (ref 11.5–15.5)
WBC Count: 7.7 10*3/uL (ref 4.0–10.5)
nRBC: 0 % (ref 0.0–0.2)

## 2019-11-12 LAB — CMP (CANCER CENTER ONLY)
ALT: 48 U/L — ABNORMAL HIGH (ref 0–44)
AST: 29 U/L (ref 15–41)
Albumin: 4 g/dL (ref 3.5–5.0)
Alkaline Phosphatase: 84 U/L (ref 38–126)
Anion gap: 6 (ref 5–15)
BUN: 7 mg/dL — ABNORMAL LOW (ref 8–23)
CO2: 26 mmol/L (ref 22–32)
Calcium: 8.6 mg/dL — ABNORMAL LOW (ref 8.9–10.3)
Chloride: 110 mmol/L (ref 98–111)
Creatinine: 1.13 mg/dL (ref 0.61–1.24)
GFR, Est AFR Am: >60 mL/min
GFR, Estimated: >60 mL/min
Glucose, Bld: 120 mg/dL — ABNORMAL HIGH (ref 70–99)
Potassium: 4 mmol/L (ref 3.5–5.1)
Sodium: 142 mmol/L (ref 135–145)
Total Bilirubin: 0.4 mg/dL (ref 0.3–1.2)
Total Protein: 7.3 g/dL (ref 6.5–8.1)

## 2019-11-12 LAB — CEA (IN HOUSE-CHCC): CEA (CHCC-In House): 1.38 ng/mL (ref 0.00–5.00)

## 2019-11-12 MED ORDER — SODIUM CHLORIDE (PF) 0.9 % IJ SOLN
INTRAMUSCULAR | Status: AC
  Filled 2019-11-12: qty 50

## 2019-11-12 MED ORDER — IOHEXOL 300 MG/ML  SOLN
100.0000 mL | Freq: Once | INTRAMUSCULAR | Status: AC | PRN
  Administered 2019-11-12: 100 mL via INTRAVENOUS

## 2019-11-13 ENCOUNTER — Encounter: Payer: Self-pay | Admitting: Oncology

## 2019-11-15 ENCOUNTER — Telehealth: Payer: Self-pay | Admitting: Oncology

## 2019-11-15 ENCOUNTER — Inpatient Hospital Stay (HOSPITAL_BASED_OUTPATIENT_CLINIC_OR_DEPARTMENT_OTHER): Payer: Medicare Other | Admitting: Nurse Practitioner

## 2019-11-15 ENCOUNTER — Telehealth: Payer: Self-pay | Admitting: Pharmacist

## 2019-11-15 ENCOUNTER — Encounter: Payer: Self-pay | Admitting: Nurse Practitioner

## 2019-11-15 ENCOUNTER — Other Ambulatory Visit: Payer: Self-pay

## 2019-11-15 VITALS — BP 133/92 | HR 84 | Temp 98.9°F | Resp 17 | Ht 68.5 in | Wt 218.0 lb

## 2019-11-15 DIAGNOSIS — C20 Malignant neoplasm of rectum: Secondary | ICD-10-CM

## 2019-11-15 MED ORDER — CAPECITABINE 500 MG PO TABS: ORAL_TABLET | ORAL | 0 refills | Status: DC

## 2019-11-15 NOTE — Telephone Encounter (Signed)
Scheduled per 3/12 los. Gave avs and calendar  

## 2019-11-15 NOTE — Progress Notes (Addendum)
  [facility] OFFICE PROGRESS NOTE   Diagnosis: Rectal cancer  INTERVAL HISTORY:   [name] returns as scheduled.  He continues to have rectal urgency, pain and bleeding.  He feels exhausted.  Objective:  Vital signs in last 24 hours:  Blood pressure (!) 133/92, pulse 84, temperature 98.9 F (37.2 C), temperature source Temporal, resp. rate 17, height 5' 8.5" (1.74 m), weight 218 lb (98.9 kg), SpO2 97 %.    HEENT: No thrush or ulcers. GI: Abdomen soft and nontender.  No hepatomegaly. Vascular: No leg edema. Neuro: Alert and oriented. Skin: Palms without erythema.   Lab Results:  Lab Results  Component Value Date   WBC 7.7 11/12/2019   HGB 17.2 (H) 11/12/2019   HCT 51.1 11/12/2019   MCV 84.5 11/12/2019   PLT 233 11/12/2019   NEUTROABS 5.4 11/12/2019    Imaging:  No results found.  Medications: I have reviewed the patient's current medications.  Assessment/Plan: 1. Rectal cancer ? Colonoscopy 10/29/2019-rectal mass at approximately 10-11 cm above the anus, biopsied and tattooed, pathology confirmed invasive well-differentiated adenocarcinoma ? CTs 11/12/2019-mild mid rectal wall thickening.  2 small perirectal nodes measuring 3 to 4 mm ? Lower EUS 11/13/2019-tumor in the proximal rectum.  Proximal posterolateral rectal mass.  2 abnormal lymph nodes in the perirectal region.  Staged T3N1 by endorectal ultrasound.   2. Chronic pain syndrome  3. Asthma 4. Fibromyalgia 5. History of a CVA 6. Syringomyelia 7. ADD  Disposition: Derrick Johnson appears to have clinical stage III rectal cancer.  We reviewed this with him and his wife at today's visit.  Dr. Benay Spice recommends neoadjuvant therapy with radiation/Xeloda.  We reviewed potential toxicities associated with Xeloda including bone marrow toxicity, nausea, hair loss, mouth sores, diarrhea, hand-foot syndrome, skin hyperpigmentation, increased sensitivity to sun, skin rash.  He agrees to proceed.  He is scheduled  for radiation simulation on 11/20/2019, first day of radiation 11/25/2019.  He will return for lab and follow-up on 12/06/2019.  He will contact the office in the interim with any problems.  Patient seen with Dr. Benay Spice.    Ned Card ANP/GNP-BC   11/15/2019  9:41 AM  This was a shared visit with Ned Card.  We discussed the staging evaluation with Derrick Johnson.  He has clinical stage III rectal cancer.  I recommend neoadjuvant capecitabine and radiation.  We will consider adjuvant chemotherapy options based on the surgical pathology from the rectal resection.  He has seen Dr. Marcello Moores.  We reviewed potential toxicities associated with capecitabine and he agrees to proceed.  Julieanne Manson, MD

## 2019-11-15 NOTE — Patient Instructions (Signed)
 Capecitabine tablets What is this medicine? CAPECITABINE (ka pe SITE a been) is a chemotherapy drug. It slows the growth of cancer cells. This medicine is used to treat breast cancer, and also colon or rectal cancer. This medicine may be used for other purposes; ask your health care provider or pharmacist if you have questions. COMMON BRAND NAME(S): Xeloda What should I tell my health care provider before I take this medicine? They need to know if you have any of these conditions:  bleeding disorders  dehydration  dihydropyrimidine dehydrogenase (DPD) deficiency  heart disease  infection (especially a virus infection such as chickenpox, cold sores, or herpes)  kidney disease  liver disease  low blood counts, like low white cell, platelet, or red cell counts  an unusual or allergic reaction to capecitabine, fluorouracil, other medicines, foods, dyes, or preservatives  pregnant or trying to get pregnant  breast-feeding How should I use this medicine? Take this medicine by mouth with a glass of water, within 30 minutes of the end of a meal. Do not cut, crush or chew this medicine. Follow the directions on the prescription label. Take your medicine at regular intervals. Do not take it more often than directed. Do not stop taking except on your doctor's advice. Your doctor may want you to take a combination of 150 mg and 500 mg tablets for each dose. It is very important that you know how to correctly take your dose. Taking the wrong tablets could result in an overdose (too much medication) or underdose (too little medication). Talk to your pediatrician regarding the use of this medicine in children. Special care may be needed. Overdosage: If you think you have taken too much of this medicine contact a poison control center or emergency room at once. NOTE: This medicine is only for you. Do not share this medicine with others. What if I miss a dose? If you miss a dose, do not take  the missed dose at all. Do not take double or extra doses. Instead, continue with your next scheduled dose and check with your doctor. What may interact with this medicine? This medicine may interact with the following medications:  allopurinol  leucovorin  phenytoin  warfarin This list may not describe all possible interactions. Give your health care provider a list of all the medicines, herbs, non-prescription drugs, or dietary supplements you use. Also tell them if you smoke, drink alcohol, or use illegal drugs. Some items may interact with your medicine. What should I watch for while using this medicine? Visit your doctor for checks on your progress. This drug may make you feel generally unwell. This is not uncommon, as chemotherapy can affect healthy cells as well as cancer cells. Report any side effects. Continue your course of treatment even though you feel ill unless your doctor tells you to stop. In some cases, you may be given additional medicines to help with side effects. Follow all directions for their use. Call your doctor or health care professional for advice if you get a fever, chills or sore throat, or other symptoms of a cold or flu. Do not treat yourself. This drug decreases your body's ability to fight infections. Try to avoid being around people who are sick. This medicine may increase your risk to bruise or bleed. Call your doctor or health care professional if you notice any unusual bleeding. Be careful brushing and flossing your teeth or using a toothpick because you may get an infection or bleed more easily. If   you have any dental work done, tell your dentist you are receiving this medicine. Avoid taking products that contain aspirin, acetaminophen, ibuprofen, naproxen, or ketoprofen unless instructed by your doctor. These medicines may hide a fever. Do not become pregnant while taking this medicine or for 6 months after stopping it. Women should inform their doctor if  they wish to become pregnant or think they might be pregnant. There is a potential for serious side effects to an unborn child. Talk to your health care professional or pharmacist for more information. Do not breast-feed an infant while taking this medicine or for 2 weeks after stopping it. Men are advised not to father a child while taking this medicine or for 3 months after stopping it. This medicine may make it more difficult to get pregnant or father a child. Talk with your doctor or health care professional if you are concerned about your fertility. What side effects may I notice from receiving this medicine? Side effects that you should report to your doctor or health care professional as soon as possible:  allergic reactions like skin rash, itching or hives, swelling of the face, lips, or tongue  diarrhea  low blood counts - this medicine may decrease the number of white blood cells, red blood cells and platelets. You may be at increased risk for infections and bleeding  nausea, vomiting  redness, blistering, peeling or loosening of the skin, including inside the mouth (this can be added for any serious or exfoliative rash that could lead to hospitalization)  redness, swelling, or sores on hands or feet  signs and symptoms of kidney injury like trouble passing urine or change in the amount of urine  signs and symptoms of liver injury like dark yellow or brown urine; general ill feeling or flu-like symptoms; light-colored stools; loss of appetite; nausea; right upper belly pain; unusually weak or tired; yellowing of the eyes or skin  signs of decreased platelets or bleeding - bruising, pinpoint red spots on the skin, black, tarry stools, blood in the urine  signs of decreased red blood cells - unusually weak or tired, fainting spells, lightheadedness  signs of infection - fever or chills, cough, sore throat, pain or difficulty passing urine Side effects that usually do not require  medical attention (report to your doctor or health care professional if they continue or are bothersome):  changes in vision  constipation  loss of appetite  mouth sores  pain, tingling, numbness in the hands or feet This list may not describe all possible side effects. Call your doctor for medical advice about side effects. You may report side effects to FDA at 1-800-FDA-1088. Where should I keep my medicine? Keep out of the reach of children. Store at room temperature between 15 and 30 degrees C (59 and 86 degrees F). Keep container tightly closed. Throw away any unused medicine after the expiration date. NOTE: This sheet is a summary. It may not cover all possible information. If you have questions about this medicine, talk to your doctor, pharmacist, or health care provider.  2020 Elsevier/Gold Standard (2017-12-05 21:22:45)  

## 2019-11-15 NOTE — Telephone Encounter (Signed)
Oral Oncology Pharmacist Encounter  Received new prescription for Xeloda (capecitabine) for the neo-adjuvant treatment of stage III rectal cancer in conjunction with XRT, planned duration until the end of radiation. Radiation start planned for 11/25/19.  CMP from 11/12/19 assessed, no relevant lab abnormalities. Prescription dose and frequency assessed.   Current medication list in Epic reviewed, no DDIs with capecitabine identified.  Prescription has been e-scribed to the [facility] for benefits analysis and approval.  Oral Oncology Clinic will continue to follow for insurance authorization, copayment issues, initial counseling and start date.  Darl Pikes, PharmD, BCPS, BCOP, CPP Hematology/Oncology Clinical Pharmacist [facility] [phone]  11/15/2019 12:33 PM

## 2019-11-18 ENCOUNTER — Telehealth: Payer: Self-pay

## 2019-11-18 MED ORDER — XELODA 500 MG PO TABS: ORAL_TABLET | ORAL | 0 refills | Status: DC

## 2019-11-18 NOTE — Telephone Encounter (Signed)
Oral Oncology Patient Advocate Encounter  After completing a benefits investigation, prior authorization for Xeloda is not required at this time through Medicare B.  Patient's copay is $101.74.  [place] Patient [place] Phone [phone] Fax [phone] 11/18/2019 12:15 PM

## 2019-11-19 NOTE — Telephone Encounter (Signed)
Oral Chemotherapy Pharmacist Encounter  Derrick Johnson plans on picking up his Xeloda from [place] tomorrow 11/20/19. He knows not to start until his radiation starts.  Patient Education I spoke with patient for overview of new oral chemotherapy medication: Xeloda (capecitabine) for the neo-adjuvant treatment of stage III rectal cancer in conjunction with XRT, planned duration until the end of radiation. Radiation start planned for 11/25/19.   Pt is doing well. Counseled patient on administration, dosing, side effects, monitoring, drug-food interactions, safe handling, storage, and disposal. Patient will take 4 tablets (2000 mg) by mouth in AM and 3 tablets (1500 mg) in PM. Take with food. Take only on days of radiation.  Side effects include but not limited to: diarrhea, hand-foot syndrome, N/V, fatigue, decreased wbc.    Reviewed with patient importance of keeping a medication schedule and plan for any missed doses.  Derrick Johnson voiced understanding and appreciation. All questions answered. Medication handout placed in the mail.  Provided patient with [facility] phone number. Patient knows to call the office with questions or concerns. Oral Chemotherapy Navigation Clinic will continue to follow.  Darl Pikes, PharmD, BCPS, BCOP, CPP Hematology/Oncology Clinical Pharmacist [facility] [phone]  11/19/2019 4:04 PM

## 2019-11-20 ENCOUNTER — Other Ambulatory Visit: Payer: Self-pay

## 2019-11-20 ENCOUNTER — Ambulatory Visit
Admission: RE | Admit: 2019-11-20 | Discharge: 2019-11-20 | Disposition: A | Discharge status: Home / Self Care | Payer: Medicare Other | Source: Ambulatory Visit | Attending: Radiation Oncology | Admitting: Radiation Oncology

## 2019-11-20 DIAGNOSIS — C20 Malignant neoplasm of rectum: Secondary | ICD-10-CM | POA: Diagnosis not present

## 2019-11-20 DIAGNOSIS — Z510 Encounter for antineoplastic radiation therapy: Secondary | ICD-10-CM | POA: Insufficient documentation

## 2019-11-20 MED FILL — XELODA 500 MG TABLET: 500 | 28 days supply | Qty: 196 | Fill #0

## 2019-11-20 NOTE — Progress Notes (Signed)
I spoke with the patient this am to consent him for treatment and we reviewed the recommendations for chemoRT, followed by surgery and +/- adjuvant treatment for his rectal cancer as outlined in GI conference this am.     Carola Rhine, [facility]

## 2019-11-22 DIAGNOSIS — Z510 Encounter for antineoplastic radiation therapy: Secondary | ICD-10-CM | POA: Diagnosis not present

## 2019-11-25 ENCOUNTER — Ambulatory Visit
Admission: RE | Admit: 2019-11-25 | Discharge: 2019-11-25 | Disposition: A | Discharge status: Home / Self Care | Payer: Medicare Other | Source: Ambulatory Visit | Attending: Radiation Oncology | Admitting: Radiation Oncology

## 2019-11-25 ENCOUNTER — Other Ambulatory Visit: Payer: Self-pay

## 2019-11-25 DIAGNOSIS — Z510 Encounter for antineoplastic radiation therapy: Secondary | ICD-10-CM | POA: Diagnosis not present

## 2019-11-26 ENCOUNTER — Other Ambulatory Visit: Payer: Self-pay

## 2019-11-26 ENCOUNTER — Ambulatory Visit
Admission: RE | Admit: 2019-11-26 | Discharge: 2019-11-26 | Disposition: A | Discharge status: Home / Self Care | Payer: Medicare Other | Source: Ambulatory Visit | Attending: Radiation Oncology | Admitting: Radiation Oncology

## 2019-11-26 DIAGNOSIS — Z510 Encounter for antineoplastic radiation therapy: Secondary | ICD-10-CM | POA: Diagnosis not present

## 2019-11-27 ENCOUNTER — Other Ambulatory Visit: Payer: Self-pay

## 2019-11-27 ENCOUNTER — Ambulatory Visit
Admission: RE | Admit: 2019-11-27 | Discharge: 2019-11-27 | Disposition: A | Discharge status: Home / Self Care | Payer: Medicare Other | Source: Ambulatory Visit | Attending: Radiation Oncology | Admitting: Radiation Oncology

## 2019-11-27 DIAGNOSIS — Z510 Encounter for antineoplastic radiation therapy: Secondary | ICD-10-CM | POA: Diagnosis not present

## 2019-11-27 NOTE — Progress Notes (Signed)
Pt here for patient teaching.  Pt given Radiation and You booklet and skin care instructions.  Reviewed areas of pertinence such as diarrhea, fatigue, hair loss, sexual and fertility changes, skin changes and urinary and bladder changes . Pt able to give teach back of to pat skin, use unscented/gentle soap, use baby wipes, have Imodium on hand, drink plenty of water and sitz bath,avoid applying anything to skin within 4 hours of treatment. Pt verbalizes understanding of information given and will contact nursing with any questions or concerns.     Kenyette Gundy M. Mohab Ashby RN, BSN           

## 2019-11-28 ENCOUNTER — Other Ambulatory Visit: Payer: Self-pay

## 2019-11-28 ENCOUNTER — Ambulatory Visit
Admission: RE | Admit: 2019-11-28 | Discharge: 2019-11-28 | Disposition: A | Discharge status: Home / Self Care | Payer: Medicare Other | Source: Ambulatory Visit | Attending: Radiation Oncology | Admitting: Radiation Oncology

## 2019-11-28 DIAGNOSIS — Z510 Encounter for antineoplastic radiation therapy: Secondary | ICD-10-CM | POA: Diagnosis not present

## 2019-11-29 ENCOUNTER — Ambulatory Visit
Admission: RE | Admit: 2019-11-29 | Discharge: 2019-11-29 | Disposition: A | Discharge status: Home / Self Care | Payer: Medicare Other | Source: Ambulatory Visit | Attending: Radiation Oncology | Admitting: Radiation Oncology

## 2019-11-29 ENCOUNTER — Other Ambulatory Visit: Payer: Self-pay

## 2019-11-29 DIAGNOSIS — Z510 Encounter for antineoplastic radiation therapy: Secondary | ICD-10-CM | POA: Diagnosis not present

## 2019-12-02 ENCOUNTER — Ambulatory Visit
Admission: RE | Admit: 2019-12-02 | Discharge: 2019-12-02 | Disposition: A | Discharge status: Home / Self Care | Payer: Medicare Other | Source: Ambulatory Visit | Attending: Radiation Oncology | Admitting: Radiation Oncology

## 2019-12-02 ENCOUNTER — Other Ambulatory Visit: Payer: Self-pay

## 2019-12-02 DIAGNOSIS — Z510 Encounter for antineoplastic radiation therapy: Secondary | ICD-10-CM | POA: Diagnosis not present

## 2019-12-03 ENCOUNTER — Ambulatory Visit
Admission: RE | Admit: 2019-12-03 | Discharge: 2019-12-03 | Disposition: A | Discharge status: Home / Self Care | Payer: Medicare Other | Source: Ambulatory Visit | Attending: Radiation Oncology | Admitting: Radiation Oncology

## 2019-12-03 ENCOUNTER — Other Ambulatory Visit: Payer: Self-pay

## 2019-12-03 DIAGNOSIS — Z510 Encounter for antineoplastic radiation therapy: Secondary | ICD-10-CM | POA: Diagnosis not present

## 2019-12-04 ENCOUNTER — Ambulatory Visit
Admission: RE | Admit: 2019-12-04 | Discharge: 2019-12-04 | Disposition: A | Discharge status: Home / Self Care | Payer: Medicare Other | Source: Ambulatory Visit | Attending: Radiation Oncology | Admitting: Radiation Oncology

## 2019-12-04 ENCOUNTER — Other Ambulatory Visit: Payer: Self-pay

## 2019-12-04 DIAGNOSIS — Z510 Encounter for antineoplastic radiation therapy: Secondary | ICD-10-CM | POA: Diagnosis not present

## 2019-12-05 ENCOUNTER — Other Ambulatory Visit: Payer: Self-pay

## 2019-12-05 ENCOUNTER — Ambulatory Visit
Admission: RE | Admit: 2019-12-05 | Discharge: 2019-12-05 | Disposition: A | Discharge status: Home / Self Care | Payer: Medicare Other | Source: Ambulatory Visit | Attending: Radiation Oncology | Admitting: Radiation Oncology

## 2019-12-05 DIAGNOSIS — C20 Malignant neoplasm of rectum: Secondary | ICD-10-CM | POA: Insufficient documentation

## 2019-12-05 DIAGNOSIS — Z510 Encounter for antineoplastic radiation therapy: Secondary | ICD-10-CM | POA: Diagnosis not present

## 2019-12-05 LAB — SURGICAL PATHOLOGY

## 2019-12-06 ENCOUNTER — Inpatient Hospital Stay: Payer: Medicare Other

## 2019-12-06 ENCOUNTER — Telehealth: Payer: Self-pay | Admitting: Oncology

## 2019-12-06 ENCOUNTER — Other Ambulatory Visit: Payer: Self-pay

## 2019-12-06 ENCOUNTER — Inpatient Hospital Stay: Payer: Medicare Other | Attending: Oncology | Admitting: Oncology

## 2019-12-06 ENCOUNTER — Ambulatory Visit
Admission: RE | Admit: 2019-12-06 | Discharge: 2019-12-06 | Disposition: A | Discharge status: Home / Self Care | Payer: Medicare Other | Source: Ambulatory Visit | Attending: Radiation Oncology | Admitting: Radiation Oncology

## 2019-12-06 VITALS — BP 132/77 | HR 84 | Temp 98.7°F | Resp 20 | Ht 68.5 in | Wt 220.3 lb

## 2019-12-06 DIAGNOSIS — Z923 Personal history of irradiation: Secondary | ICD-10-CM | POA: Diagnosis not present

## 2019-12-06 DIAGNOSIS — C20 Malignant neoplasm of rectum: Secondary | ICD-10-CM | POA: Insufficient documentation

## 2019-12-06 DIAGNOSIS — G894 Chronic pain syndrome: Secondary | ICD-10-CM | POA: Diagnosis not present

## 2019-12-06 DIAGNOSIS — M797 Fibromyalgia: Secondary | ICD-10-CM | POA: Diagnosis not present

## 2019-12-06 DIAGNOSIS — J45909 Unspecified asthma, uncomplicated: Secondary | ICD-10-CM | POA: Diagnosis not present

## 2019-12-06 DIAGNOSIS — Z8673 Personal history of transient ischemic attack (TIA), and cerebral infarction without residual deficits: Secondary | ICD-10-CM | POA: Diagnosis not present

## 2019-12-06 DIAGNOSIS — G950 Syringomyelia and syringobulbia: Secondary | ICD-10-CM | POA: Insufficient documentation

## 2019-12-06 DIAGNOSIS — Z510 Encounter for antineoplastic radiation therapy: Secondary | ICD-10-CM | POA: Diagnosis not present

## 2019-12-06 LAB — CBC WITH DIFFERENTIAL (CANCER CENTER ONLY)
Abs Immature Granulocytes: 0.04 10*3/uL (ref 0.00–0.07)
Basophils Absolute: 0 10*3/uL (ref 0.0–0.1)
Basophils Relative: 1 %
Eosinophils Absolute: 0.2 10*3/uL (ref 0.0–0.5)
Eosinophils Relative: 4 %
HCT: 46.6 % (ref 39.0–52.0)
Hemoglobin: 16 g/dL (ref 13.0–17.0)
Immature Granulocytes: 1 %
Lymphocytes Relative: 16 %
Lymphs Abs: 0.8 10*3/uL (ref 0.7–4.0)
MCH: 28.6 pg (ref 26.0–34.0)
MCHC: 34.3 g/dL (ref 30.0–36.0)
MCV: 83.4 fL (ref 80.0–100.0)
Monocytes Absolute: 0.6 10*3/uL (ref 0.1–1.0)
Monocytes Relative: 13 %
Neutro Abs: 3.1 10*3/uL (ref 1.7–7.7)
Neutrophils Relative %: 65 %
Platelet Count: 171 10*3/uL (ref 150–400)
RBC: 5.59 MIL/uL (ref 4.22–5.81)
RDW: 13.3 % (ref 11.5–15.5)
WBC Count: 4.8 10*3/uL (ref 4.0–10.5)
nRBC: 0 % (ref 0.0–0.2)

## 2019-12-06 LAB — CMP (CANCER CENTER ONLY)
ALT: 52 U/L — ABNORMAL HIGH (ref 0–44)
AST: 30 U/L (ref 15–41)
Albumin: 4.1 g/dL (ref 3.5–5.0)
Alkaline Phosphatase: 76 U/L (ref 38–126)
Anion gap: 9 (ref 5–15)
BUN: 13 mg/dL (ref 8–23)
CO2: 23 mmol/L (ref 22–32)
Calcium: 8.9 mg/dL (ref 8.9–10.3)
Chloride: 108 mmol/L (ref 98–111)
Creatinine: 1.08 mg/dL (ref 0.61–1.24)
GFR, Est AFR Am: >60 mL/min (ref >60)
GFR, Estimated: >60 mL/min (ref >60)
Glucose, Bld: 115 mg/dL — ABNORMAL HIGH (ref 70–99)
Potassium: 4 mmol/L (ref 3.5–5.1)
Sodium: 140 mmol/L (ref 135–145)
Total Bilirubin: 0.4 mg/dL (ref 0.3–1.2)
Total Protein: 7.6 g/dL (ref 6.5–8.1)

## 2019-12-06 NOTE — Telephone Encounter (Signed)
Scheduled per los. Gave avs and calendar  

## 2019-12-06 NOTE — Progress Notes (Signed)
  [place] OFFICE PROGRESS NOTE   Diagnosis: Rectal cancer  INTERVAL HISTORY:   Mr. Wohl began concurrent capecitabine and radiation on 11/25/2019.  No mouth sores, nausea, diarrhea, or hand/foot pain.  He reports recent constipation and abdominal cramping.  He discontinued Movantik and started MiraLAX.  This helped.  Objective:  Vital signs in last 24 hours:  Blood pressure 132/77, pulse 84, temperature 98.7 F (37.1 C), temperature source Temporal, resp. rate 20, height 5' 8.5" (1.74 m), weight 220 lb 4.8 oz (99.9 kg), SpO2 97 %.    HEENT: No thrush or ulcers GI: Soft and nontender, no hepatomegaly, no mass Vascular: No leg edema  Skin: Palms and soles without erythema or skin breakdown    Lab Results:  Lab Results  Component Value Date   WBC 4.8 12/06/2019   HGB 16.0 12/06/2019   HCT 46.6 12/06/2019   MCV 83.4 12/06/2019   PLT 171 12/06/2019   NEUTROABS 3.1 12/06/2019    CMP  Lab Results  Component Value Date   NA 140 12/06/2019   K 4.0 12/06/2019   CL 108 12/06/2019   CO2 23 12/06/2019   GLUCOSE 115 (H) 12/06/2019   BUN 13 12/06/2019   CREATININE 1.08 12/06/2019   CALCIUM 8.9 12/06/2019   PROT 7.6 12/06/2019   ALBUMIN 4.1 12/06/2019   AST 30 12/06/2019   ALT 52 (H) 12/06/2019   ALKPHOS 76 12/06/2019   BILITOT 0.4 12/06/2019   GFRNONAA >60 12/06/2019   GFRAA >60 12/06/2019     Medications: I have reviewed the patient's current medications.   Assessment/Plan: 1. Rectal cancer ? Colonoscopy 10/29/2019-rectal mass at approximately 10-11 cm above the anus, biopsied and tattooed, pathology confirmed invasive well-differentiated adenocarcinoma, normal mismatch repair protein expression ? CTs 11/12/2019-mild mid rectal wall thickening.  2 small perirectal nodes measuring 3 to 4 mm ? Lower EUS 11/13/2019-tumor in the proximal rectum.  Proximal posterolateral rectal mass.  2 abnormal lymph nodes in the perirectal region.  Staged T3N1 by  endorectal ultrasound.   ? Xeloda/radiation 11/25/2019 2. Chronic pain syndrome  3. Asthma 4. Fibromyalgia 5. History of a CVA 6. Syringomyelia 7. ADD    Disposition: Mr. Slaven appears to be tolerating the Xeloda and radiation well.  He will continue the current treatment.  I encouraged him to obtain the COVID-19 vaccine. Mr. Mahl will be scheduled for an office visit on 12/23/2019.  Betsy Coder, MD  12/06/2019  10:13 AM

## 2019-12-09 ENCOUNTER — Ambulatory Visit
Admission: RE | Admit: 2019-12-09 | Discharge: 2019-12-09 | Disposition: A | Discharge status: Home / Self Care | Payer: Medicare Other | Source: Ambulatory Visit | Attending: Radiation Oncology | Admitting: Radiation Oncology

## 2019-12-09 ENCOUNTER — Other Ambulatory Visit: Payer: Self-pay

## 2019-12-09 DIAGNOSIS — Z510 Encounter for antineoplastic radiation therapy: Secondary | ICD-10-CM | POA: Diagnosis not present

## 2019-12-10 ENCOUNTER — Other Ambulatory Visit: Payer: Self-pay

## 2019-12-10 ENCOUNTER — Ambulatory Visit
Admission: RE | Admit: 2019-12-10 | Discharge: 2019-12-10 | Disposition: A | Discharge status: Home / Self Care | Payer: Medicare Other | Source: Ambulatory Visit | Attending: Radiation Oncology | Admitting: Radiation Oncology

## 2019-12-10 DIAGNOSIS — Z510 Encounter for antineoplastic radiation therapy: Secondary | ICD-10-CM | POA: Diagnosis not present

## 2019-12-11 ENCOUNTER — Ambulatory Visit
Admission: RE | Admit: 2019-12-11 | Discharge: 2019-12-11 | Disposition: A | Discharge status: Home / Self Care | Payer: Medicare Other | Source: Ambulatory Visit | Attending: Radiation Oncology | Admitting: Radiation Oncology

## 2019-12-11 ENCOUNTER — Other Ambulatory Visit: Payer: Self-pay

## 2019-12-11 DIAGNOSIS — Z510 Encounter for antineoplastic radiation therapy: Secondary | ICD-10-CM | POA: Diagnosis not present

## 2019-12-12 ENCOUNTER — Ambulatory Visit
Admission: RE | Admit: 2019-12-12 | Discharge: 2019-12-12 | Disposition: A | Discharge status: Home / Self Care | Payer: Medicare Other | Source: Ambulatory Visit | Attending: Radiation Oncology | Admitting: Radiation Oncology

## 2019-12-12 ENCOUNTER — Other Ambulatory Visit: Payer: Self-pay

## 2019-12-12 DIAGNOSIS — Z510 Encounter for antineoplastic radiation therapy: Secondary | ICD-10-CM | POA: Diagnosis not present

## 2019-12-13 ENCOUNTER — Ambulatory Visit
Admission: RE | Admit: 2019-12-13 | Discharge: 2019-12-13 | Disposition: A | Discharge status: Home / Self Care | Payer: Medicare Other | Source: Ambulatory Visit | Attending: Radiation Oncology | Admitting: Radiation Oncology

## 2019-12-13 ENCOUNTER — Other Ambulatory Visit: Payer: Self-pay

## 2019-12-13 DIAGNOSIS — Z510 Encounter for antineoplastic radiation therapy: Secondary | ICD-10-CM | POA: Diagnosis not present

## 2019-12-16 ENCOUNTER — Other Ambulatory Visit: Payer: Self-pay

## 2019-12-16 ENCOUNTER — Ambulatory Visit
Admission: RE | Admit: 2019-12-16 | Discharge: 2019-12-16 | Disposition: A | Discharge status: Home / Self Care | Payer: Medicare Other | Source: Ambulatory Visit | Attending: Radiation Oncology | Admitting: Radiation Oncology

## 2019-12-16 DIAGNOSIS — Z510 Encounter for antineoplastic radiation therapy: Secondary | ICD-10-CM | POA: Diagnosis not present

## 2019-12-17 ENCOUNTER — Ambulatory Visit
Admission: RE | Admit: 2019-12-17 | Discharge: 2019-12-17 | Disposition: A | Discharge status: Home / Self Care | Payer: Medicare Other | Source: Ambulatory Visit | Attending: Radiation Oncology | Admitting: Radiation Oncology

## 2019-12-17 ENCOUNTER — Other Ambulatory Visit: Payer: Self-pay

## 2019-12-17 DIAGNOSIS — Z510 Encounter for antineoplastic radiation therapy: Secondary | ICD-10-CM | POA: Diagnosis not present

## 2019-12-18 ENCOUNTER — Other Ambulatory Visit: Payer: Self-pay

## 2019-12-18 ENCOUNTER — Ambulatory Visit
Admission: RE | Admit: 2019-12-18 | Discharge: 2019-12-18 | Disposition: A | Discharge status: Home / Self Care | Payer: Medicare Other | Source: Ambulatory Visit | Attending: Radiation Oncology | Admitting: Radiation Oncology

## 2019-12-18 DIAGNOSIS — Z510 Encounter for antineoplastic radiation therapy: Secondary | ICD-10-CM | POA: Diagnosis not present

## 2019-12-19 ENCOUNTER — Other Ambulatory Visit: Payer: Self-pay

## 2019-12-19 ENCOUNTER — Ambulatory Visit
Admission: RE | Admit: 2019-12-19 | Discharge: 2019-12-19 | Disposition: A | Discharge status: Home / Self Care | Payer: Medicare Other | Source: Ambulatory Visit | Attending: Radiation Oncology | Admitting: Radiation Oncology

## 2019-12-19 DIAGNOSIS — Z510 Encounter for antineoplastic radiation therapy: Secondary | ICD-10-CM | POA: Diagnosis not present

## 2019-12-20 ENCOUNTER — Ambulatory Visit
Admission: RE | Admit: 2019-12-20 | Discharge: 2019-12-20 | Disposition: A | Discharge status: Home / Self Care | Payer: Medicare Other | Source: Ambulatory Visit | Attending: Radiation Oncology | Admitting: Radiation Oncology

## 2019-12-20 ENCOUNTER — Other Ambulatory Visit: Payer: Self-pay

## 2019-12-20 DIAGNOSIS — Z510 Encounter for antineoplastic radiation therapy: Secondary | ICD-10-CM | POA: Diagnosis not present

## 2019-12-23 ENCOUNTER — Ambulatory Visit
Admission: RE | Admit: 2019-12-23 | Discharge: 2019-12-23 | Disposition: A | Discharge status: Home / Self Care | Payer: Medicare Other | Source: Ambulatory Visit | Attending: Radiation Oncology | Admitting: Radiation Oncology

## 2019-12-23 ENCOUNTER — Other Ambulatory Visit: Payer: Self-pay

## 2019-12-23 ENCOUNTER — Inpatient Hospital Stay: Payer: Medicare Other

## 2019-12-23 ENCOUNTER — Inpatient Hospital Stay (HOSPITAL_BASED_OUTPATIENT_CLINIC_OR_DEPARTMENT_OTHER): Payer: Medicare Other | Admitting: Oncology

## 2019-12-23 VITALS — BP 134/87 | HR 75 | Temp 98.9°F | Resp 17 | Ht 68.5 in | Wt 218.1 lb

## 2019-12-23 DIAGNOSIS — C20 Malignant neoplasm of rectum: Secondary | ICD-10-CM | POA: Diagnosis not present

## 2019-12-23 DIAGNOSIS — Z510 Encounter for antineoplastic radiation therapy: Secondary | ICD-10-CM | POA: Diagnosis not present

## 2019-12-23 LAB — CBC WITH DIFFERENTIAL (CANCER CENTER ONLY)
Abs Immature Granulocytes: 0.2 10*3/uL — ABNORMAL HIGH (ref 0.00–0.07)
Basophils Absolute: 0.1 10*3/uL (ref 0.0–0.1)
Basophils Relative: 1 %
Eosinophils Absolute: 0.2 10*3/uL (ref 0.0–0.5)
Eosinophils Relative: 4 %
HCT: 46.4 % (ref 39.0–52.0)
Hemoglobin: 15.8 g/dL (ref 13.0–17.0)
Immature Granulocytes: 4 %
Lymphocytes Relative: 10 %
Lymphs Abs: 0.6 10*3/uL — ABNORMAL LOW (ref 0.7–4.0)
MCH: 29 pg (ref 26.0–34.0)
MCHC: 34.1 g/dL (ref 30.0–36.0)
MCV: 85.3 fL (ref 80.0–100.0)
Monocytes Absolute: 0.7 10*3/uL (ref 0.1–1.0)
Monocytes Relative: 11 %
Neutro Abs: 4 10*3/uL (ref 1.7–7.7)
Neutrophils Relative %: 70 %
Platelet Count: 178 10*3/uL (ref 150–400)
RBC: 5.44 MIL/uL (ref 4.22–5.81)
RDW: 15.9 % — ABNORMAL HIGH (ref 11.5–15.5)
WBC Count: 5.7 10*3/uL (ref 4.0–10.5)
nRBC: 0 % (ref 0.0–0.2)

## 2019-12-23 LAB — CMP (CANCER CENTER ONLY)
ALT: 41 U/L (ref 0–44)
AST: 27 U/L (ref 15–41)
Albumin: 4 g/dL (ref 3.5–5.0)
Alkaline Phosphatase: 71 U/L (ref 38–126)
Anion gap: 8 (ref 5–15)
BUN: 11 mg/dL (ref 8–23)
CO2: 23 mmol/L (ref 22–32)
Calcium: 8.7 mg/dL — ABNORMAL LOW (ref 8.9–10.3)
Chloride: 109 mmol/L (ref 98–111)
Creatinine: 1.14 mg/dL (ref 0.61–1.24)
GFR, Est AFR Am: >60 mL/min (ref >60)
GFR, Estimated: >60 mL/min (ref >60)
Glucose, Bld: 127 mg/dL — ABNORMAL HIGH (ref 70–99)
Potassium: 3.8 mmol/L (ref 3.5–5.1)
Sodium: 140 mmol/L (ref 135–145)
Total Bilirubin: 0.6 mg/dL (ref 0.3–1.2)
Total Protein: 7.6 g/dL (ref 6.5–8.1)

## 2019-12-23 NOTE — Progress Notes (Signed)
  [place] OFFICE PROGRESS NOTE   Diagnosis: Rectal cancer  INTERVAL HISTORY:   Derrick Johnson returns as scheduled.  He continues Xeloda and radiation.  No mouth sores or diarrhea.  He has discomfort at the rectum and perineum.  No hand or foot pain.  Objective:  Vital signs in last 24 hours:  Blood pressure 134/87, pulse 75, temperature 98.9 F (37.2 C), temperature source Temporal, resp. rate 17, height 5' 8.5" (1.74 m), weight 218 lb 1.6 oz (98.9 kg), SpO2 95 %.    HEENT: No thrush or ulcers GI: Soft and nontender, no hepatosplenomegaly Vascular: No leg edema  Skin: Palms and soles without erythema or skin breakdown, erythema at the gluteus and perineum without skin breakdown    Lab Results:  Lab Results  Component Value Date   WBC 5.7 12/23/2019   HGB 15.8 12/23/2019   HCT 46.4 12/23/2019   MCV 85.3 12/23/2019   PLT 178 12/23/2019   NEUTROABS 4.0 12/23/2019    CMP  Lab Results  Component Value Date   NA 140 12/23/2019   K 3.8 12/23/2019   CL 109 12/23/2019   CO2 23 12/23/2019   GLUCOSE 127 (H) 12/23/2019   BUN 11 12/23/2019   CREATININE 1.14 12/23/2019   CALCIUM 8.7 (L) 12/23/2019   PROT 7.6 12/23/2019   ALBUMIN 4.0 12/23/2019   AST 27 12/23/2019   ALT 41 12/23/2019   ALKPHOS 71 12/23/2019   BILITOT 0.6 12/23/2019   GFRNONAA >60 12/23/2019   GFRAA >60 12/23/2019    Lab Results  Component Value Date   CEA1 1.38 11/12/2019    Medications: I have reviewed the patient's current medications.   Assessment/Plan: 1. Rectal cancer ? Colonoscopy 10/29/2019-rectal mass at approximately 10-11 cm above the anus, biopsied and tattooed, pathology confirmed invasive well-differentiated adenocarcinoma, normal mismatch repair protein expression ? CTs 11/12/2019-mild mid rectal wall thickening.  2 small perirectal nodes measuring 3 to 4 mm ? Lower EUS 11/13/2019-tumor in the proximal rectum.  Proximal posterolateral rectal mass.  2 abnormal lymph nodes  in the perirectal region.  Staged T3N1 by endorectal ultrasound.   ? Xeloda/radiation 11/25/2019 2. Chronic pain syndrome  3. Asthma 4. Fibromyalgia 5. History of a CVA 6. Syringomyelia 7. ADD   Mr. Irani continues to tolerate the capecitabine and radiation well.  He will continue the current treatment.  He will complete radiation on 01/01/2020.  He will then see Dr. Marcello Moores for restaging and surgical planning.  He will return for an office visit in 1 month.  Betsy Coder, MD  12/23/2019  8:58 AM

## 2019-12-24 ENCOUNTER — Telehealth: Payer: Self-pay | Admitting: Oncology

## 2019-12-24 ENCOUNTER — Ambulatory Visit
Admission: RE | Admit: 2019-12-24 | Discharge: 2019-12-24 | Disposition: A | Discharge status: Home / Self Care | Payer: Medicare Other | Source: Ambulatory Visit | Attending: Radiation Oncology | Admitting: Radiation Oncology

## 2019-12-24 ENCOUNTER — Other Ambulatory Visit: Payer: Self-pay

## 2019-12-24 DIAGNOSIS — Z510 Encounter for antineoplastic radiation therapy: Secondary | ICD-10-CM | POA: Diagnosis not present

## 2019-12-24 NOTE — Telephone Encounter (Signed)
Scheduled per los. Called and spoke with patient. Confirmed appt 

## 2019-12-25 ENCOUNTER — Ambulatory Visit
Admission: RE | Admit: 2019-12-25 | Discharge: 2019-12-25 | Disposition: A | Discharge status: Home / Self Care | Payer: Medicare Other | Source: Ambulatory Visit | Attending: Radiation Oncology | Admitting: Radiation Oncology

## 2019-12-25 ENCOUNTER — Other Ambulatory Visit: Payer: Self-pay

## 2019-12-25 DIAGNOSIS — Z510 Encounter for antineoplastic radiation therapy: Secondary | ICD-10-CM | POA: Diagnosis not present

## 2019-12-26 ENCOUNTER — Other Ambulatory Visit: Payer: Self-pay

## 2019-12-26 ENCOUNTER — Ambulatory Visit
Admission: RE | Admit: 2019-12-26 | Discharge: 2019-12-26 | Disposition: A | Discharge status: Home / Self Care | Payer: Medicare Other | Source: Ambulatory Visit | Attending: Radiation Oncology | Admitting: Radiation Oncology

## 2019-12-26 DIAGNOSIS — Z510 Encounter for antineoplastic radiation therapy: Secondary | ICD-10-CM | POA: Diagnosis not present

## 2019-12-27 ENCOUNTER — Ambulatory Visit
Admission: RE | Admit: 2019-12-27 | Discharge: 2019-12-27 | Disposition: A | Discharge status: Home / Self Care | Payer: Medicare Other | Source: Ambulatory Visit | Attending: Radiation Oncology | Admitting: Radiation Oncology

## 2019-12-27 ENCOUNTER — Other Ambulatory Visit: Payer: Self-pay

## 2019-12-27 DIAGNOSIS — Z510 Encounter for antineoplastic radiation therapy: Secondary | ICD-10-CM | POA: Diagnosis not present

## 2019-12-30 ENCOUNTER — Ambulatory Visit
Admission: RE | Admit: 2019-12-30 | Discharge: 2019-12-30 | Disposition: A | Discharge status: Home / Self Care | Payer: Medicare Other | Source: Ambulatory Visit | Attending: Radiation Oncology | Admitting: Radiation Oncology

## 2019-12-30 ENCOUNTER — Other Ambulatory Visit: Payer: Self-pay

## 2019-12-30 DIAGNOSIS — Z510 Encounter for antineoplastic radiation therapy: Secondary | ICD-10-CM | POA: Diagnosis not present

## 2019-12-31 ENCOUNTER — Ambulatory Visit
Admission: RE | Admit: 2019-12-31 | Discharge: 2019-12-31 | Disposition: A | Discharge status: Home / Self Care | Payer: Medicare Other | Source: Ambulatory Visit | Attending: Radiation Oncology | Admitting: Radiation Oncology

## 2019-12-31 ENCOUNTER — Other Ambulatory Visit: Payer: Self-pay

## 2019-12-31 DIAGNOSIS — Z510 Encounter for antineoplastic radiation therapy: Secondary | ICD-10-CM | POA: Diagnosis not present

## 2020-01-01 ENCOUNTER — Other Ambulatory Visit: Payer: Self-pay

## 2020-01-01 ENCOUNTER — Encounter: Payer: Self-pay | Admitting: Radiation Oncology

## 2020-01-01 ENCOUNTER — Ambulatory Visit
Admission: RE | Admit: 2020-01-01 | Discharge: 2020-01-01 | Disposition: A | Discharge status: Home / Self Care | Payer: Medicare Other | Source: Ambulatory Visit | Attending: Radiation Oncology | Admitting: Radiation Oncology

## 2020-01-01 DIAGNOSIS — Z510 Encounter for antineoplastic radiation therapy: Secondary | ICD-10-CM | POA: Diagnosis not present

## 2020-01-06 ENCOUNTER — Ambulatory Visit: Payer: Self-pay | Admitting: General Surgery

## 2020-01-06 MED ORDER — GENTAMICIN SULFATE 40 MG/ML IJ SOLN: 5.0000 mg/kg | INTRAVENOUS | Status: AC

## 2020-01-06 MED ORDER — DEXTROSE 5 % IV SOLN: 900.0000 mg | INTRAVENOUS | Status: AC

## 2020-01-06 NOTE — H&P (Signed)
The patient is a 64 year old male who presents with colorectal cancer. This is a 64 year old male who is seen in the office today for a newly diagnosed proximal rectal cancer. Patient had noted blood per rectum for several years as well as intermittent constipation and pressure. He was noted to have heme positive stool and underwent a colonoscopy on October 29, 2019. This revealed a 10 mm polyp in ascending colon which was resected and a malignant appearing mass in the rectum approximately 10-11 cm from the anal verge. This was biopsied and tattooed. Pathology showed a well-differentiated invasive adenocarcinoma. He underwent CT scans of the chest abdomen and pelvis which showed some mid rectal wall thickening, and 2 small perirectal nodes. He does have a spinal stimulator for chronic pain syndrome and will not be able to undergo an MRI. He underwent an endorectal ultrasound with Dr. Paulita Fujita , which showed a proximal posterior lateral rectal mass and to abnormal appearing lymph nodes. T3 N1. His radiation completed on 01/01/2020. He had some trouble with pain during the end of radiation, but otherwise has been doing fine.   Problem List/Past Medical Leighton Ruff, MD; [id number] 11:31 AM) RECTAL CANCER (C20)  Past Surgical History Leighton Ruff, MD; [id number] 11:31 AM) Gallbladder Surgery - Laparoscopic Oral Surgery Shoulder Surgery Bilateral. Spinal Surgery - Lower Back Spinal Surgery Midback  Diagnostic Studies History Leighton Ruff, MD; [id number] 11:31 AM) Colonoscopy within last year  Allergies (Chanel Teressa Senter, CMA; 01/06/2020 10:49 AM) Penicillins Anaphylaxis. Allergies Reconciled  Medication History Leighton Ruff, MD; [id number] 11:31 AM) Methadone HCl (10MG  Tablet, Oral three times daily) Active. Amphetamine-Dextroamphetamine (20MG  Tablet, Oral daily) Active. Movantik (25MG  Tablet, Oral daily) Active. Topiramate (50MG  Tablet, 1-2 tabs Oral two times daily)  Active. traZODone HCl (150MG  Tablet, Oral at bedtime) Active. SUMAtriptan Succinate (100MG  Tablet, Oral as needed) Active. Albuterol Sulfate HFA (108 (90 Base)MCG/ACT Aerosol Soln, Inhalation as needed) Active. Albuterol Sulfate (1.25MG /3ML Nebulized Soln, Inhalation once a week) Active. Mens One Daily (Oral) Active. (Rainbow light Men's One) Medications Reconciled Neomycin Sulfate (500MG  Tablet, 2 (two) Oral SEE NOTE, Taken starting 01/06/2020) Active. (TAKE TWO TABLETS AT 2 PM, 3 PM, AND 10 PM THE DAY PRIOR TO SURGERY) Flagyl (500MG  Tablet, 2 (two) Oral SEE NOTE, Taken starting 01/06/2020) Active. (Take at 2pm, 3pm, and 10pm the day prior to your colon operation)  Social History Leighton Ruff, MD; [id number] 11:31 AM) Alcohol use Remotely quit alcohol use. Caffeine use Carbonated beverages, Coffee, Tea. No drug use Tobacco use Never smoker.  Family History Leighton Ruff, MD; [id number] 11:31 AM) Cerebrovascular Accident Family Members In General. Colon Cancer Sister. Heart Disease Father. Migraine Headache Family Members In General.  Other Problems Leighton Ruff, MD; [id number] 11:31 AM) Asthma Back Pain Cerebrovascular Accident Cholelithiasis Migraine Headache Other disease, cancer, significant illness Rectal Cancer Thyroid Disease     Review of Systems Leighton Ruff MD; [id number] 11:31 AM) General Present- Fatigue. Not Present- Appetite Loss, Chills, Fever, Night Sweats, Weight Gain and Weight Loss. Skin Not Present- Change in Wart/Mole, Dryness, Hives, Jaundice, New Lesions, Non-Healing Wounds, Rash and Ulcer. HEENT Present- Hearing Loss, Ringing in the Ears and Seasonal Allergies. Not Present- Earache, Hoarseness, Nose Bleed, Oral Ulcers, Sinus Pain, Sore Throat, Visual Disturbances, Wears glasses/contact lenses and Yellow Eyes. Respiratory Not Present- Bloody sputum, Chronic Cough, Difficulty Breathing, Snoring and Wheezing. Breast Not Present-  Breast Mass, Breast Pain, Nipple Discharge and Skin Changes. Cardiovascular Not Present- Chest Pain, Difficulty Breathing Lying Down, Leg Cramps, Palpitations, Rapid Heart Rate, Shortness of  Breath and Swelling of Extremities. Gastrointestinal Present- Abdominal Pain, Bloating, Bloody Stool, Change in Bowel Habits, Constipation, Indigestion and Rectal Pain. Not Present- Chronic diarrhea, Difficulty Swallowing, Excessive gas, Gets full quickly at meals, Hemorrhoids, Nausea and Vomiting.  Vitals (Chanel Nolan CMA; 01/06/2020 10:49 AM) 01/06/2020 10:49 AM Weight: 225.13 lb Height: 69in Body Surface Area: 2.17 m Body Mass Index: 33.24 kg/m  Temp.: 98.54F  Pulse: 112 (Regular)  BP: 132/76(Sitting, Left Arm, Standard)        Physical Exam Leighton Ruff MD; [id number] 11:32 AM)  General Mental Status-Alert. General Appearance-Cooperative.  Abdomen Palpation/Percussion Palpation and Percussion of the abdomen reveal - Soft and Non Tender.  Rectal Note: No mass palpated on digital rectal exam    Assessment & Plan Leighton Ruff MD; [id number] 11:32 AM)  RECTAL CANCER (C20) Impression: 64 year old male with stage III rectal cancer, status post chemotherapy and radiation treatment. His lesion is tattooed above and below the mass. On exam today, there is no palpable tumor. I am hoping we came perform a low anterior resection and not need a diverting ileostomy. We did discuss this possibility if needed. We will plan on doing his surgery in late June or early July. The surgery and anatomy were described to the patient as well as the risks of surgery and the possible complications. These include: Bleeding, deep abdominal infections and possible wound complications such as hernia and infection, damage to adjacent structures, leak of surgical connections, which can lead to other surgeries and possibly an ostomy, possible need for other procedures, such as abscess drains in radiology,  possible prolonged hospital stay, possible diarrhea from removal of part of the colon, possible constipation from narcotics, possible bowel, bladder or sexual dysfunction if having rectal surgery, prolonged fatigue/weakness or appetite loss, possible early recurrence of of disease, possible complications of their medical problems such as heart disease or arrhythmias or lung problems, death (less than 1%). I believe the patient understands and wishes to proceed with the surgery.

## 2020-01-20 ENCOUNTER — Inpatient Hospital Stay: Payer: Medicare Other

## 2020-01-20 ENCOUNTER — Telehealth: Payer: Self-pay | Admitting: Oncology

## 2020-01-20 ENCOUNTER — Other Ambulatory Visit: Payer: Self-pay

## 2020-01-20 ENCOUNTER — Inpatient Hospital Stay: Payer: Medicare Other | Attending: Oncology | Admitting: Oncology

## 2020-01-20 VITALS — BP 127/74 | HR 64 | Temp 98.5°F | Resp 18 | Ht 68.5 in | Wt 220.4 lb

## 2020-01-20 DIAGNOSIS — C20 Malignant neoplasm of rectum: Secondary | ICD-10-CM | POA: Diagnosis present

## 2020-01-20 DIAGNOSIS — Z8673 Personal history of transient ischemic attack (TIA), and cerebral infarction without residual deficits: Secondary | ICD-10-CM | POA: Diagnosis not present

## 2020-01-20 DIAGNOSIS — M549 Dorsalgia, unspecified: Secondary | ICD-10-CM | POA: Diagnosis not present

## 2020-01-20 DIAGNOSIS — J45909 Unspecified asthma, uncomplicated: Secondary | ICD-10-CM | POA: Insufficient documentation

## 2020-01-20 DIAGNOSIS — G950 Syringomyelia and syringobulbia: Secondary | ICD-10-CM | POA: Diagnosis not present

## 2020-01-20 DIAGNOSIS — M797 Fibromyalgia: Secondary | ICD-10-CM | POA: Insufficient documentation

## 2020-01-20 DIAGNOSIS — F988 Other specified behavioral and emotional disorders with onset usually occurring in childhood and adolescence: Secondary | ICD-10-CM | POA: Diagnosis not present

## 2020-01-20 DIAGNOSIS — G894 Chronic pain syndrome: Secondary | ICD-10-CM | POA: Diagnosis not present

## 2020-01-20 LAB — CBC WITH DIFFERENTIAL (CANCER CENTER ONLY)
Abs Immature Granulocytes: 0.12 10*3/uL — ABNORMAL HIGH (ref 0.00–0.07)
Basophils Absolute: 0.1 10*3/uL (ref 0.0–0.1)
Basophils Relative: 2 %
Eosinophils Absolute: 0.3 10*3/uL (ref 0.0–0.5)
Eosinophils Relative: 5 %
HCT: 41.1 % (ref 39.0–52.0)
Hemoglobin: 14.4 g/dL (ref 13.0–17.0)
Immature Granulocytes: 2 %
Lymphocytes Relative: 11 %
Lymphs Abs: 0.6 10*3/uL — ABNORMAL LOW (ref 0.7–4.0)
MCH: 30.1 pg (ref 26.0–34.0)
MCHC: 35 g/dL (ref 30.0–36.0)
MCV: 86 fL (ref 80.0–100.0)
Monocytes Absolute: 0.9 10*3/uL (ref 0.1–1.0)
Monocytes Relative: 17 %
Neutro Abs: 3.2 10*3/uL (ref 1.7–7.7)
Neutrophils Relative %: 63 %
Platelet Count: 192 10*3/uL (ref 150–400)
RBC: 4.78 MIL/uL (ref 4.22–5.81)
RDW: 17.9 % — ABNORMAL HIGH (ref 11.5–15.5)
WBC Count: 5.1 10*3/uL (ref 4.0–10.5)
nRBC: 0 % (ref 0.0–0.2)

## 2020-01-20 MED ORDER — OXYCODONE HCL 5 MG PO TABS: 5.0000 mg | ORAL_TABLET | Freq: Three times a day (TID) | ORAL | 0 refills | Status: DC | PRN

## 2020-01-20 NOTE — Telephone Encounter (Signed)
Spoke with pt's wife. R/s appt on 7/12 to 7/15. Per 5/17 sch message.

## 2020-01-20 NOTE — Progress Notes (Signed)
[place] OFFICE PROGRESS NOTE   Diagnosis: Rectal cancer  INTERVAL HISTORY:   Derrick Johnson completed the course of Xeloda and radiation on 01/01/2020.  No mouth sores or diarrhea.  Skin erythema in the groin and gluteal/perineal areas is improving.  He complains of severe pain with bowel movements.  He has rectal pain at other times.  His back pain has also increased.  He has not contacted the cancer center with a complaint of pain.  He saw Dr. Marcello Moores and is scheduled for surgery on 03/04/2020.  Good appetite.  He continues methadone.  Objective:  Vital signs in last 24 hours:  Blood pressure 127/74, pulse 64, temperature 98.5 F (36.9 C), temperature source Temporal, resp. rate 18, height 5' 8.5" (1.74 m), weight 220 lb 6.4 oz (100 kg), SpO2 99 %.    Lymphatics: No cervical, supraclavicular, axillary, or inguinal nodes GI: No hepatosplenomegaly, nontender, no mass  Skin: Radiation erythema at the medial gluteus extending to the perineum.  No skin breakdown.  Erythema in the bilateral groin without skin breakdown.    Lab Results:  Lab Results  Component Value Date   WBC 5.1 01/20/2020   HGB 14.4 01/20/2020   HCT 41.1 01/20/2020   MCV 86.0 01/20/2020   PLT 192 01/20/2020   NEUTROABS 3.2 01/20/2020    CMP  Lab Results  Component Value Date   NA 140 12/23/2019   K 3.8 12/23/2019   CL 109 12/23/2019   CO2 23 12/23/2019   GLUCOSE 127 (H) 12/23/2019   BUN 11 12/23/2019   CREATININE 1.14 12/23/2019   CALCIUM 8.7 (L) 12/23/2019   PROT 7.6 12/23/2019   ALBUMIN 4.0 12/23/2019   AST 27 12/23/2019   ALT 41 12/23/2019   ALKPHOS 71 12/23/2019   BILITOT 0.6 12/23/2019   GFRNONAA >60 12/23/2019   GFRAA >60 12/23/2019    Lab Results  Component Value Date   CEA1 1.38 11/12/2019     Medications: I have reviewed the patient's current medications.   Assessment/Plan: 1. Rectal cancer ? Colonoscopy 10/29/2019-rectal mass at approximately 10-11 cm above the  anus, biopsied and tattooed, pathology confirmed invasive well-differentiated adenocarcinoma, normal mismatch repair protein expression ? CTs 11/12/2019-mild mid rectal wall thickening.  2 small perirectal nodes measuring 3 to 4 mm ? Lower EUS 11/13/2019-tumor in the proximal rectum.  Proximal posterolateral rectal mass.  2 abnormal lymph nodes in the perirectal region.  Staged T3N1 by endorectal ultrasound.   ? Xeloda/radiation 11/25/2019-01/01/2020 2. Chronic pain syndrome  3. Asthma 4. Fibromyalgia 5. History of a CVA 6. Syringomyelia 7. ADD    Disposition: Derrick Johnson completed neoadjuvant therapy.  He has radiation erythema at the perineum without skin breakdown.  He complains of severe pain with bowel movements.  He also reports an increase in chronic back pain.  Derrick Johnson became very agitated today and stated we had not adequately addressed his pain.  He has not contacted the cancer center with a complaint of pain.  I discussed pain management with Derrick Johnson and his wife.  He was seen today by myself and Valda Favia the GI nurse navigator.  We explained that Dr. Arelia Sneddon will continue prescribing his methadone.  We agreed to prescribe oxycodone to help with his pain until he has rectal surgery.  If the pain is related to radiation there should be improvement over the next few weeks.  We will contact Dr. Arelia Sneddon to let him know we have prescribed oxycodone.  Derrick Johnson will contact  us if the oxycodone does not help his pain or if the pain does not improve.  He will return for an office visit after surgery.  We will review the surgical pathology and decide on adjuvant therapy.  Betsy Coder, MD  01/20/2020  8:35 AM

## 2020-01-20 NOTE — Progress Notes (Signed)
Spoke with Dr. Arelia Sneddon' nurse regarding patient presents today with increased rectal pain and back pain.  He is having surgery on 03/04/2020 by Dr. Marcello Moores. Dr. Benay Spice prescribed Oxycodone 5 mg take one every 8 hours for severe pain #60 this one time.  She verbalized an understanding and will make Dr. Arelia Sneddon aware.

## 2020-01-20 NOTE — Telephone Encounter (Signed)
Scheduled per 5/17 los. Printed AVS and calandar for pt.

## 2020-01-22 NOTE — Progress Notes (Signed)
  Radiation Oncology         [phone] ________________________________  Name: ANDARIUS DERSHEM MRN: [id number]  Date: 01/01/2020  DOB: [birth year]  End of Treatment Note  Diagnosis:   Rectal cancer     Indication for treatment:  Curative       Radiation treatment dates:   11/25/19 - 01/01/20  Site/dose:    The patient was treated to the pelvis to a dose of 45 Gy at 1.8 Gy per fraction. This was accomplished using a 4 field 3-D conformal technique. The patient then received a boost to the tumor and adjacent high-risk regions for an additional 5.4 Gy at 1.8 gray per fraction. This was carried out using a coned-down 4 field approach. The patient's total dose was 50.4 Gy. Daily AlignRT was used on a daily basis to insure proper patient positioning and localization of critical targets/ structures. The patient received concurrent chemotherapy during the course of radiation treatment.  Narrative: The patient tolerated radiation treatment relatively well.     Plan: The patient has completed radiation treatment. The patient will return to radiation oncology clinic for routine followup in one month. I advised the patient to call or return sooner if they have any questions or concerns related to their recovery or treatment.   ------------------------------------------------  Jodelle Gross, MD, PhD

## 2020-01-28 ENCOUNTER — Telehealth: Payer: Self-pay | Admitting: Radiation Oncology

## 2020-01-28 NOTE — Telephone Encounter (Signed)
  Radiation Oncology         [phone] ________________________________  Name: JALEEN CRAIGIE MRN: [id number]  Date of Service: 02/17/20  DOB: Jan 31, 1956  Post Treatment Telephone Note  Diagnosis:   T3N1 Adenocarcinoma of the rectum.  Interval Since Last Radiation: 7 weeks   11/25/19 - 01/01/20:    The patient was treated to the pelvis to a dose of 45 Gy at 1.8 Gy per fraction. This was accomplished using a 4 field 3-D conformal technique. The patient then received a boost to the tumor and adjacent high-risk regions for an additional 5.4 Gy at 1.8 gray per fraction. This was carried out using a coned-down 4 field approach. The patient's total dose was 50.4 Gy. Daily AlignRT was used on a daily basis to insure proper patient positioning and localization of critical targets/ structures. The patient received concurrent chemotherapy during the course of radiation treatment.  Narrative:  The patient was contacted today for routine follow-up. During treatment he did very well with radiotherapy and did not have significant desquamation. He is scheduled for robotic surgery with Dr. Marcello Moores on 03/04/20.    Impression/Plan: 1. T3N1 Adenocarcinoma of the rectum. The patient has been doing well since completion of radiotherapy. I had to leave him a voicemail but asked that he call me back so we could discuss how he's doing. I discussed that we would be happy to continue to follow him as needed, but he will also continue to follow up with Dr. Benay Spice in medical oncology and with Dr. Marcello Moores in [place].     Carola Rhine, PAC

## 2020-02-24 ENCOUNTER — Encounter (HOSPITAL_COMMUNITY): Payer: Self-pay

## 2020-02-24 ENCOUNTER — Other Ambulatory Visit (HOSPITAL_COMMUNITY): Payer: Medicare Other

## 2020-02-24 NOTE — Progress Notes (Signed)
COVID Vaccine Completed: Date COVID Vaccine completed: COVID vaccine manufacturer: Pfizer    Moderna   [employer]   PCP - Dr. Gwyneth Revels Cardiologist -   Chest x-ray -  EKG -  Stress Test -  ECHO -  Cardiac Cath -   Sleep Study -  CPAP -   Fasting Blood Sugar -  Checks Blood Sugar _____ times a day  Blood Thinner Instructions: Aspirin Instructions: Last Dose:  Anesthesia review: Stroke, dissection cerebral artery  Patient denies shortness of breath, fever, cough and chest pain at PAT appointment   Patient verbalized understanding of instructions that were given to them at the PAT appointment. Patient was also instructed that they will need to review over the PAT instructions again at home before surgery.

## 2020-02-25 ENCOUNTER — Encounter (HOSPITAL_COMMUNITY)
Admission: RE | Admit: 2020-02-25 | Discharge: 2020-02-25 | Disposition: A | Discharge status: Home / Self Care | Payer: Medicare Other | Source: Ambulatory Visit | Attending: General Surgery | Admitting: General Surgery

## 2020-02-25 ENCOUNTER — Telehealth: Payer: Self-pay | Admitting: *Deleted

## 2020-02-25 ENCOUNTER — Encounter (HOSPITAL_COMMUNITY): Payer: Medicare Other

## 2020-02-25 HISTORY — DX: Unspecified asthma, uncomplicated: J45.909

## 2020-02-25 HISTORY — DX: Malignant neoplasm of rectum: C20

## 2020-02-25 NOTE — Telephone Encounter (Signed)
Per Dr. Benay Spice: needs to be sooner than 03/19/20--10 week window after RT that surgery or treatment needs to be done to improve his outcome. Wants to discuss treatment options. Left VM for patient to return call to schedule sooner follow up.

## 2020-02-28 NOTE — Telephone Encounter (Signed)
Spoke w/patient regarding MD request to move appointment up sooner than 03/19/20. He reports he is not ready to do this right now. Has appointment on Monday regarding teeth being removed and wants to see what the plans are there first. He agreed to allow RN to f/u with him on Wednesday next week.

## 2020-02-29 ENCOUNTER — Other Ambulatory Visit (HOSPITAL_COMMUNITY): Payer: Medicare Other

## 2020-03-04 ENCOUNTER — Telehealth: Payer: Self-pay | Admitting: *Deleted

## 2020-03-04 ENCOUNTER — Inpatient Hospital Stay: Admit: 2020-03-04 | Payer: Medicare Other | Admitting: General Surgery

## 2020-03-04 SURGERY — RESECTION, RECTUM, LOW ANTERIOR, ROBOT-ASSISTED
Anesthesia: General

## 2020-03-04 NOTE — Telephone Encounter (Signed)
Attempted to schedule patient to see Dr. Benay Spice week of 7/5, but he declines. Just had #22 teeth pulled and on antibiotics for infection and does not want to come sooner. Reminded him that since he has declined surgery, more delay can impact the efficacy of his treatment. He still declines to come in sooner.

## 2020-03-16 ENCOUNTER — Ambulatory Visit: Payer: Medicare Other | Admitting: Oncology

## 2020-03-19 ENCOUNTER — Ambulatory Visit: Payer: Medicare Other | Admitting: Oncology

## 2020-12-02 IMAGING — CT CT CHEST W/ CM
3 of 5 series · 14 of 36 positions shown, 17 images · IV contrast (APPLIED)
Comparison: None.

CLINICAL DATA: Newly diagnosed rectal cancer

EXAM:
CT CHEST, ABDOMEN, AND PELVIS WITH CONTRAST
TECHNIQUE: Multidetector CT imaging of the chest, abdomen and pelvis was
performed following the standard protocol during bolus
administration of intravenous contrast.
CONTRAST:  100mL OMNIPAQUE IOHEXOL 300 MG/ML  SOLN

[Series 2: cap with · axial · 0.93mm/px · z∈[-627,-77]mm · 9 of 138 slices shown, 12 images]
[im 14/138  mediastinal]
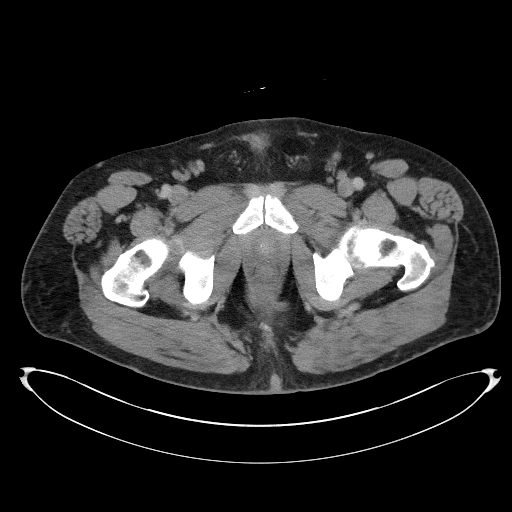
[im 14/138  lung]
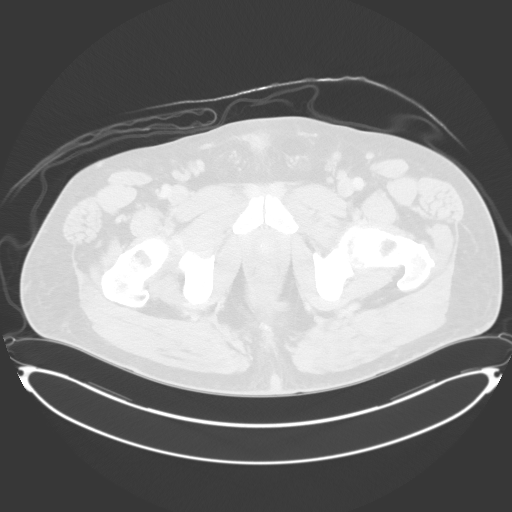
[im 28/138  lung]
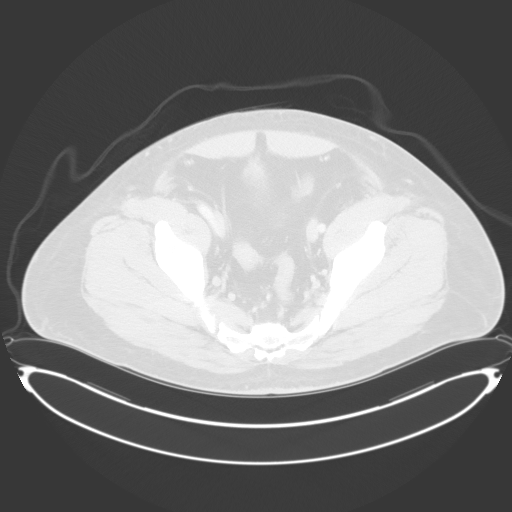
[im 42/138  lung]
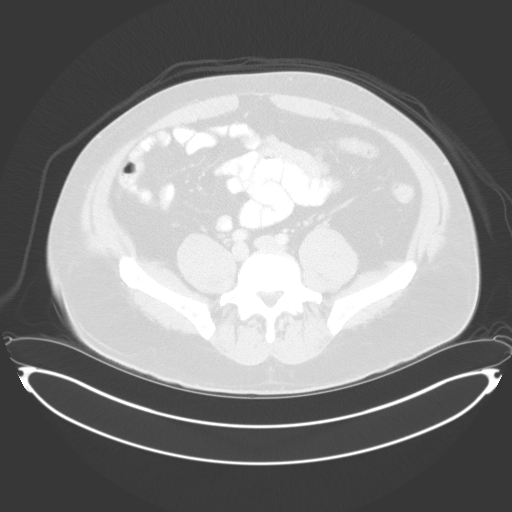
[im 55/138  lung]
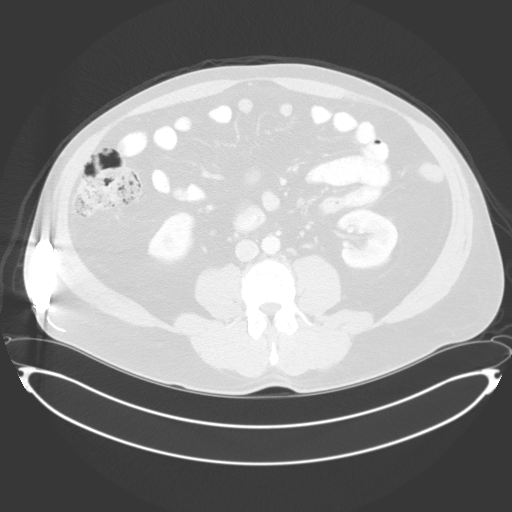
[im 69/138  mediastinal]
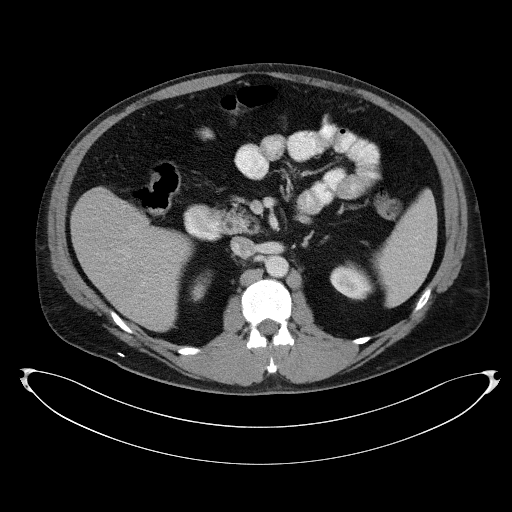
[im 69/138  lung]
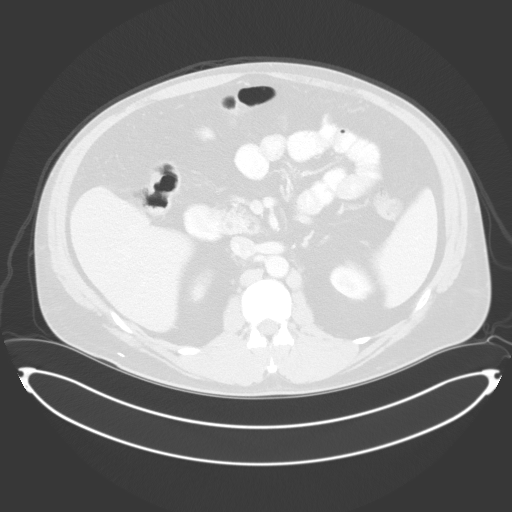
[im 83/138  lung]
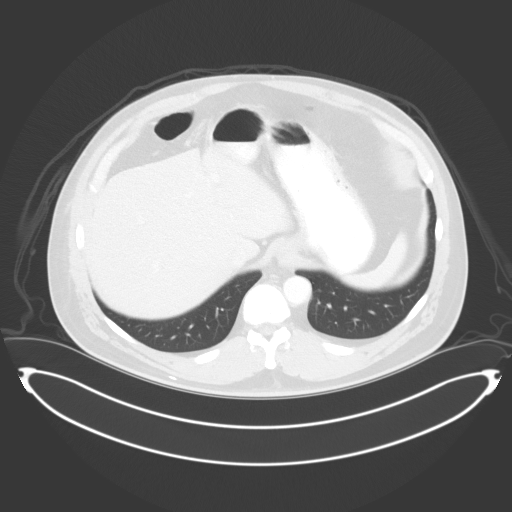
[im 96/138  lung]
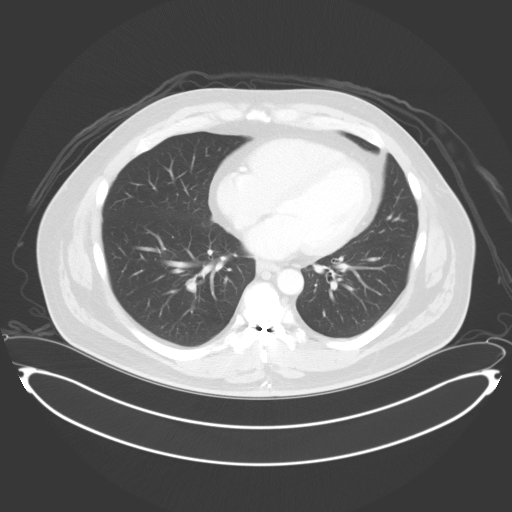
[im 110/138  lung]
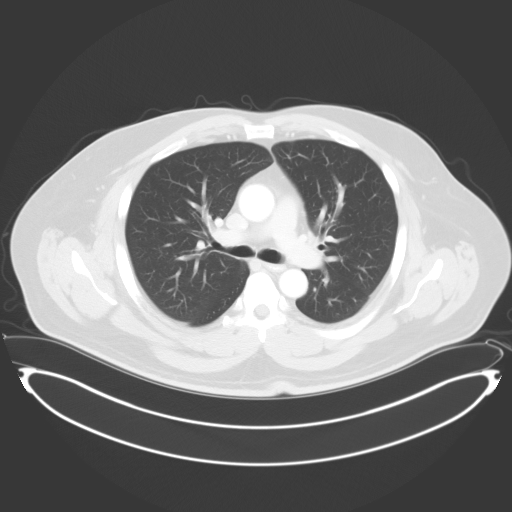
[im 124/138  mediastinal]
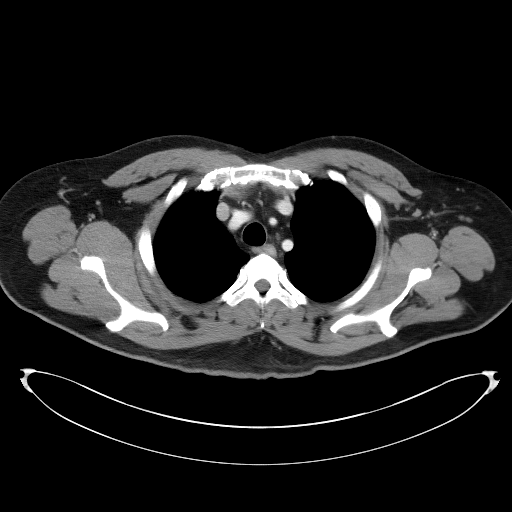
[im 124/138  lung]
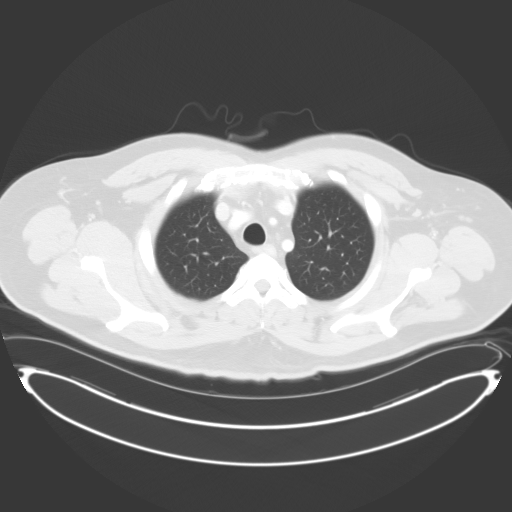

[Series 4: coronals · coronal · 0.94mm/px · 3 of 168 slices shown]
[im 34/168  lung]
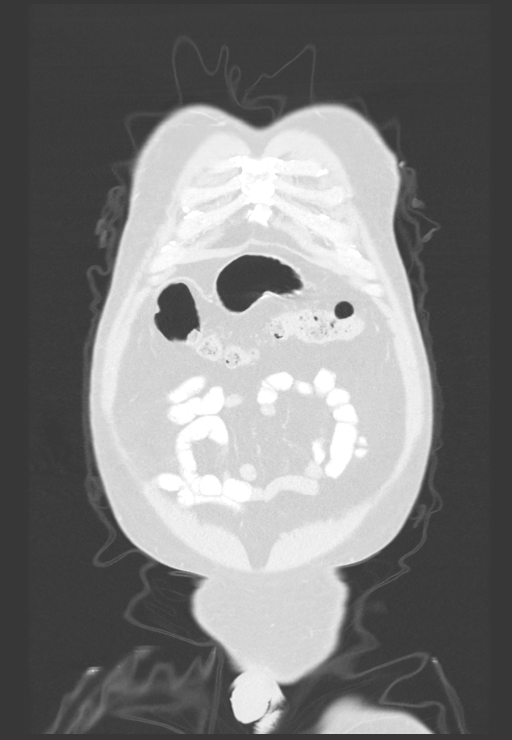
[im 67/168  lung]
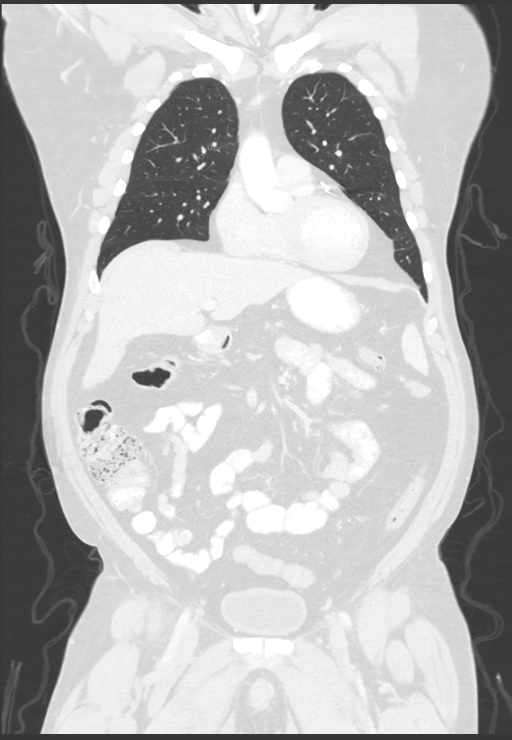
[im 101/168  lung]
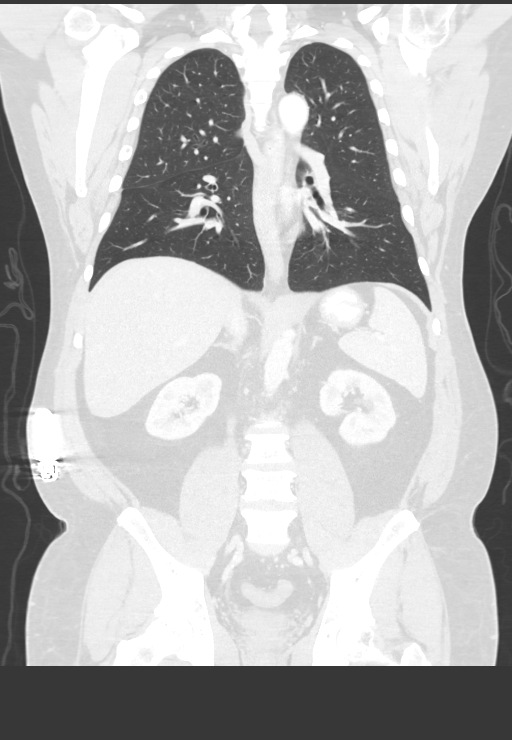

[Series 6: lung · axial · 0.93mm/px · z∈[-317,-265]mm · 2 of 168 slices shown]
[im 13/168  lung]
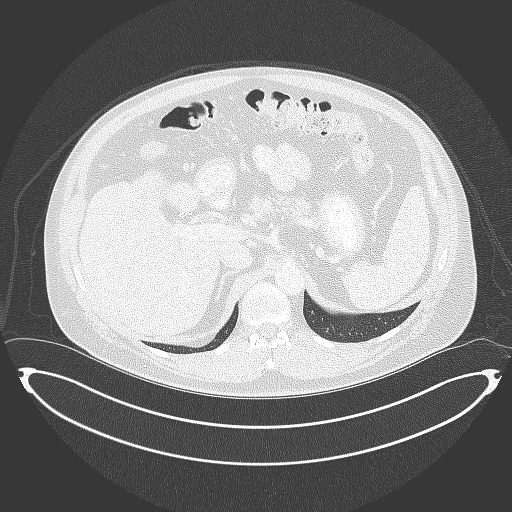
[im 39/168  lung]
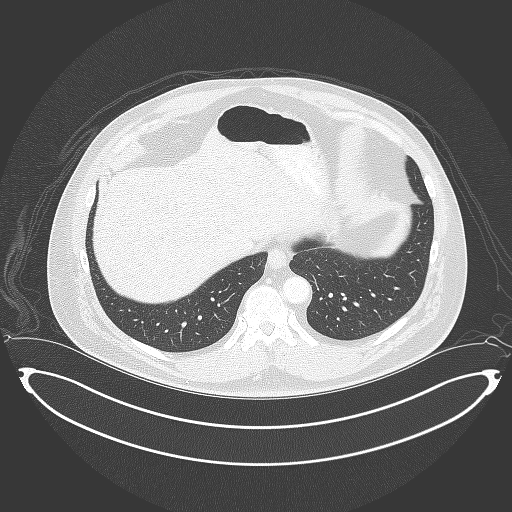

[14 of 36 positions shown; findings below may reference images not displayed]

FINDINGS: CT CHEST FINDINGS

Cardiovascular: Heart is normal in size.  No pericardial effusion.

No evidence of thoracic aortic aneurysm. Mild atherosclerotic
calcifications of the aortic arch.

Coronary atherosclerosis the LAD and right coronary artery.

Mediastinum/Nodes: No suspicious mediastinal lymphadenopathy.

Visualized thyroid is unremarkable.

Lungs/Pleura: No suspicious pulmonary nodules.

No focal consolidation.

No pleural effusion or pneumothorax.

Musculoskeletal: Mild degenerative changes of the lower thoracic
spine. Thoracic spine stimulator.

CT ABDOMEN PELVIS FINDINGS

Hepatobiliary: Liver is within normal limits. No
suspicious/enhancing hepatic lesions.

Status post cholecystectomy. No intrahepatic or extrahepatic ductal
dilatation.

Pancreas: Within normal limits.

Spleen: Within normal limits.

Adrenals/Urinary Tract: Adrenal glands are within normal limits.

Kidneys are within normal limits.  No hydronephrosis.

Thick-walled bladder, although underdistended.

Stomach/Bowel: Stomach is within normal limits.

No evidence of bowel obstruction.

Normal appendix (series 2/image 94).

Mild concentric mid rectal wall thickening (series 2/image 119),
likely corresponding to the patient's newly diagnosed rectal cancer.
No definite extension into the pericolonic soft tissues.

Vascular/Lymphatic: No evidence of abdominal aortic aneurysm.

Atherosclerotic calcifications of the bilateral iliac vessels.

No suspicious abdominopelvic lymphadenopathy. Two small perirectal
nodes measuring 3-4 mm short axis (series 2/images 114 and 117),
technically within normal limits.

Reproductive: Prostate is unremarkable.

Other: No abdominopelvic ascites.

Musculoskeletal: Visualized osseous structures are within normal
limits.
IMPRESSION: Mild mid rectal wall thickening, likely corresponding to the
patient's newly diagnosed rectal cancer. No definite pericolonic
extension.

Two small perirectal nodes measuring 3-4 mm short axis, technically
within normal limits.

No findings specific for metastatic disease.

## 2023-11-27 ENCOUNTER — Emergency Department (HOSPITAL_COMMUNITY): Admit: 2023-11-27 | Admitting: Internal Medicine

## 2023-11-27 ENCOUNTER — Telehealth: Payer: Self-pay | Admitting: *Deleted

## 2023-11-27 ENCOUNTER — Encounter (HOSPITAL_COMMUNITY)
Admission: EM | Disposition: A | Discharge status: Home / Self Care | Payer: Self-pay | Source: Home / Self Care | Attending: Internal Medicine

## 2023-11-27 ENCOUNTER — Inpatient Hospital Stay (HOSPITAL_COMMUNITY)
Admission: EM | Admit: 2023-11-27 | Discharge: 2023-11-29 | DRG: 321 | Disposition: A | Discharge status: Home / Self Care | Attending: Internal Medicine | Admitting: Internal Medicine

## 2023-11-27 ENCOUNTER — Other Ambulatory Visit: Payer: Self-pay

## 2023-11-27 DIAGNOSIS — E872 Acidosis, unspecified: Secondary | ICD-10-CM | POA: Diagnosis present

## 2023-11-27 DIAGNOSIS — Z8673 Personal history of transient ischemic attack (TIA), and cerebral infarction without residual deficits: Secondary | ICD-10-CM | POA: Diagnosis not present

## 2023-11-27 DIAGNOSIS — I5021 Acute systolic (congestive) heart failure: Secondary | ICD-10-CM | POA: Diagnosis present

## 2023-11-27 DIAGNOSIS — I2102 ST elevation (STEMI) myocardial infarction involving left anterior descending coronary artery: Principal | ICD-10-CM

## 2023-11-27 DIAGNOSIS — I213 ST elevation (STEMI) myocardial infarction of unspecified site: Principal | ICD-10-CM | POA: Diagnosis present

## 2023-11-27 DIAGNOSIS — M797 Fibromyalgia: Secondary | ICD-10-CM | POA: Diagnosis present

## 2023-11-27 DIAGNOSIS — J45909 Unspecified asthma, uncomplicated: Secondary | ICD-10-CM | POA: Diagnosis present

## 2023-11-27 DIAGNOSIS — G894 Chronic pain syndrome: Secondary | ICD-10-CM | POA: Diagnosis present

## 2023-11-27 DIAGNOSIS — Z8249 Family history of ischemic heart disease and other diseases of the circulatory system: Secondary | ICD-10-CM

## 2023-11-27 DIAGNOSIS — Z9682 Presence of neurostimulator: Secondary | ICD-10-CM

## 2023-11-27 DIAGNOSIS — I2510 Atherosclerotic heart disease of native coronary artery without angina pectoris: Secondary | ICD-10-CM | POA: Diagnosis present

## 2023-11-27 DIAGNOSIS — Z9221 Personal history of antineoplastic chemotherapy: Secondary | ICD-10-CM

## 2023-11-27 DIAGNOSIS — Z79891 Long term (current) use of opiate analgesic: Secondary | ICD-10-CM

## 2023-11-27 DIAGNOSIS — Z85048 Personal history of other malignant neoplasm of rectum, rectosigmoid junction, and anus: Secondary | ICD-10-CM | POA: Diagnosis not present

## 2023-11-27 DIAGNOSIS — Z955 Presence of coronary angioplasty implant and graft: Secondary | ICD-10-CM

## 2023-11-27 DIAGNOSIS — Z79899 Other long term (current) drug therapy: Secondary | ICD-10-CM | POA: Diagnosis not present

## 2023-11-27 DIAGNOSIS — I255 Ischemic cardiomyopathy: Secondary | ICD-10-CM | POA: Diagnosis present

## 2023-11-27 DIAGNOSIS — Z880 Allergy status to penicillin: Secondary | ICD-10-CM | POA: Diagnosis not present

## 2023-11-27 DIAGNOSIS — E782 Mixed hyperlipidemia: Secondary | ICD-10-CM | POA: Diagnosis present

## 2023-11-27 DIAGNOSIS — E785 Hyperlipidemia, unspecified: Secondary | ICD-10-CM | POA: Diagnosis present

## 2023-11-27 HISTORY — PX: CORONARY/GRAFT ACUTE MI REVASCULARIZATION: CATH118305

## 2023-11-27 HISTORY — DX: ST elevation (STEMI) myocardial infarction of unspecified site: I21.3

## 2023-11-27 HISTORY — PX: LEFT HEART CATH AND CORONARY ANGIOGRAPHY: CATH118249

## 2023-11-27 HISTORY — DX: Atherosclerotic heart disease of native coronary artery without angina pectoris: I25.10

## 2023-11-27 LAB — CBC WITH DIFFERENTIAL/PLATELET
Abs Immature Granulocytes: 0.18 10*3/uL — ABNORMAL HIGH (ref 0.00–0.07)
Basophils Absolute: 0.1 10*3/uL (ref 0.0–0.1)
Basophils Relative: 1 %
Eosinophils Absolute: 0.5 10*3/uL (ref 0.0–0.5)
Eosinophils Relative: 5 %
HCT: 44.6 % (ref 39.0–52.0)
Hemoglobin: 15.6 g/dL (ref 13.0–17.0)
Immature Granulocytes: 2 %
Lymphocytes Relative: 18 %
Lymphs Abs: 1.6 10*3/uL (ref 0.7–4.0)
MCH: 28.8 pg (ref 26.0–34.0)
MCHC: 35 g/dL (ref 30.0–36.0)
MCV: 82.4 fL (ref 80.0–100.0)
Monocytes Absolute: 0.9 10*3/uL (ref 0.1–1.0)
Monocytes Relative: 9 %
Neutro Abs: 6 10*3/uL (ref 1.7–7.7)
Neutrophils Relative %: 65 %
Platelets: 233 10*3/uL (ref 150–400)
RBC: 5.41 MIL/uL (ref 4.22–5.81)
RDW: 13.6 % (ref 11.5–15.5)
WBC: 9.2 10*3/uL (ref 4.0–10.5)
nRBC: 0 % (ref 0.0–0.2)

## 2023-11-27 LAB — COMPREHENSIVE METABOLIC PANEL
ALT: 60 U/L — ABNORMAL HIGH (ref 0–44)
AST: 47 U/L — ABNORMAL HIGH (ref 15–41)
Albumin: 3.6 g/dL (ref 3.5–5.0)
Alkaline Phosphatase: 69 U/L (ref 38–126)
Anion gap: 8 (ref 5–15)
BUN: <5 mg/dL — ABNORMAL LOW (ref 8–23)
CO2: 26 mmol/L (ref 22–32)
Calcium: 8.9 mg/dL (ref 8.9–10.3)
Chloride: 106 mmol/L (ref 98–111)
Creatinine, Ser: 0.96 mg/dL (ref 0.61–1.24)
GFR, Estimated: >60 mL/min (ref >60)
Glucose, Bld: 113 mg/dL — ABNORMAL HIGH (ref 70–99)
Potassium: 3.7 mmol/L (ref 3.5–5.1)
Sodium: 140 mmol/L (ref 135–145)
Total Bilirubin: 0.5 mg/dL (ref 0.0–1.2)
Total Protein: 6.7 g/dL (ref 6.5–8.1)

## 2023-11-27 LAB — POCT I-STAT, CHEM 8
BUN: <3 mg/dL — ABNORMAL LOW (ref 8–23)
Calcium, Ion: 1.16 mmol/L (ref 1.15–1.40)
Chloride: 102 mmol/L (ref 98–111)
Creatinine, Ser: 1 mg/dL (ref 0.61–1.24)
Glucose, Bld: 111 mg/dL — ABNORMAL HIGH (ref 70–99)
HCT: 45 % (ref 39.0–52.0)
Hemoglobin: 15.3 g/dL (ref 13.0–17.0)
Potassium: 3.7 mmol/L (ref 3.5–5.1)
Sodium: 141 mmol/L (ref 135–145)
TCO2: 25 mmol/L (ref 22–32)

## 2023-11-27 LAB — POCT ACTIVATED CLOTTING TIME
Activated Clotting Time: 348 s
Activated Clotting Time: 487 s
Activated Clotting Time: 337 s

## 2023-11-27 LAB — CG4 I-STAT (LACTIC ACID)
Lactic Acid, Venous: 2.1 mmol/L (ref 0.5–1.9)
Lactic Acid, Venous: 0.3 mmol/L — ABNORMAL LOW (ref 0.5–1.9)

## 2023-11-27 LAB — TROPONIN I (HIGH SENSITIVITY): Troponin I (High Sensitivity): 495 ng/L (ref <18)

## 2023-11-27 SURGERY — CORONARY/GRAFT ACUTE MI REVASCULARIZATION
Anesthesia: LOCAL

## 2023-11-27 MED ORDER — ADENOSINE (DIAGNOSTIC) FOR INTRACORONARY USE
INTRAVENOUS | Status: DC | PRN
  Administered 2023-11-27: 24 ug via INTRACORONARY

## 2023-11-27 MED ORDER — SODIUM CHLORIDE 0.9% FLUSH
3.0000 mL | Freq: Two times a day (BID) | INTRAVENOUS | Status: DC
  Administered 2023-11-28 – 2023-11-29 (×4): 3 mL via INTRAVENOUS

## 2023-11-27 MED ORDER — HEPARIN SODIUM (PORCINE) 1000 UNIT/ML IJ SOLN
INTRAMUSCULAR | Status: DC | PRN
  Administered 2023-11-27: 5000 [IU] via INTRA_ARTERIAL
  Administered 2023-11-27: 2000 [IU] via INTRA_ARTERIAL

## 2023-11-27 MED ORDER — FENTANYL CITRATE (PF) 100 MCG/2ML IJ SOLN
INTRAMUSCULAR | Status: DC | PRN
  Administered 2023-11-27: 25 ug via INTRAVENOUS

## 2023-11-27 MED ORDER — TICAGRELOR 90 MG PO TABS
ORAL_TABLET | ORAL | Status: DC | PRN
  Administered 2023-11-27: 180 mg via ORAL

## 2023-11-27 MED ORDER — HYDRALAZINE HCL 20 MG/ML IJ SOLN: 10.0000 mg | INTRAMUSCULAR | Status: AC | PRN

## 2023-11-27 MED ORDER — NITROGLYCERIN 1 MG/10 ML FOR IR/CATH LAB
INTRA_ARTERIAL | Status: DC | PRN
  Administered 2023-11-27: 150 ug via INTRACORONARY

## 2023-11-27 MED ORDER — ATORVASTATIN CALCIUM 80 MG PO TABS
80.0000 mg | ORAL_TABLET | Freq: Every day | ORAL | Status: DC
  Administered 2023-11-28 – 2023-11-29 (×2): 80 mg via ORAL
  Filled 2023-11-27 (×2): qty 1

## 2023-11-27 MED ORDER — ROSUVASTATIN CALCIUM 20 MG PO TABS: 20.0000 mg | ORAL_TABLET | Freq: Every day | ORAL | Status: DC

## 2023-11-27 MED ORDER — ACETAMINOPHEN 325 MG PO TABS
650.0000 mg | ORAL_TABLET | ORAL | Status: DC | PRN
  Administered 2023-11-28 – 2023-11-29 (×2): 650 mg via ORAL
  Filled 2023-11-27 (×2): qty 2

## 2023-11-27 MED ORDER — ASPIRIN 81 MG PO CHEW: 324.0000 mg | CHEWABLE_TABLET | ORAL | Status: DC

## 2023-11-27 MED ORDER — NITROGLYCERIN 1 MG/10 ML FOR IR/CATH LAB
INTRA_ARTERIAL | Status: AC
  Filled 2023-11-27: qty 10

## 2023-11-27 MED ORDER — ONDANSETRON HCL 4 MG/2ML IJ SOLN: 4.0000 mg | Freq: Four times a day (QID) | INTRAMUSCULAR | Status: DC | PRN

## 2023-11-27 MED ORDER — LIDOCAINE HCL (PF) 1 % IJ SOLN
INTRAMUSCULAR | Status: AC
  Filled 2023-11-27: qty 30

## 2023-11-27 MED ORDER — SODIUM CHLORIDE 0.9 % IV SOLN: 250.0000 mL | INTRAVENOUS | Status: AC | PRN

## 2023-11-27 MED ORDER — HEPARIN (PORCINE) IN NACL 1000-0.9 UT/500ML-% IV SOLN
INTRAVENOUS | Status: DC | PRN
  Administered 2023-11-27 (×2): 500 mL

## 2023-11-27 MED ORDER — NITROGLYCERIN 0.4 MG SL SUBL: 0.4000 mg | SUBLINGUAL_TABLET | SUBLINGUAL | Status: DC | PRN

## 2023-11-27 MED ORDER — ACETAMINOPHEN 325 MG PO TABS: 650.0000 mg | ORAL_TABLET | ORAL | Status: DC | PRN

## 2023-11-27 MED ORDER — VERAPAMIL HCL 2.5 MG/ML IV SOLN
INTRAVENOUS | Status: DC | PRN
  Administered 2023-11-27: 10 mL via INTRA_ARTERIAL

## 2023-11-27 MED ORDER — VERAPAMIL HCL 2.5 MG/ML IV SOLN
INTRAVENOUS | Status: AC
  Filled 2023-11-27: qty 2

## 2023-11-27 MED ORDER — TICAGRELOR 90 MG PO TABS: 90.0000 mg | ORAL_TABLET | Freq: Two times a day (BID) | ORAL | Status: DC

## 2023-11-27 MED ORDER — FENTANYL CITRATE (PF) 100 MCG/2ML IJ SOLN
INTRAMUSCULAR | Status: AC
  Filled 2023-11-27: qty 2

## 2023-11-27 MED ORDER — SODIUM CHLORIDE 0.9 % IV SOLN: INTRAVENOUS | Status: AC

## 2023-11-27 MED ORDER — LISINOPRIL 10 MG PO TABS: 5.0000 mg | ORAL_TABLET | Freq: Every day | ORAL | Status: DC

## 2023-11-27 MED ORDER — MIDAZOLAM HCL 2 MG/2ML IJ SOLN
INTRAMUSCULAR | Status: DC | PRN
  Administered 2023-11-27: 1 mg via INTRAVENOUS

## 2023-11-27 MED ORDER — NOREPINEPHRINE 4 MG/250ML-% IV SOLN
INTRAVENOUS | Status: DC | PRN
  Administered 2023-11-27: 10 ug/min via INTRAVENOUS

## 2023-11-27 MED ORDER — MORPHINE SULFATE (PF) 2 MG/ML IV SOLN
INTRAVENOUS | Status: AC
  Filled 2023-11-27: qty 2

## 2023-11-27 MED ORDER — HEPARIN SODIUM (PORCINE) 1000 UNIT/ML IJ SOLN
INTRAMUSCULAR | Status: AC
  Filled 2023-11-27: qty 10

## 2023-11-27 MED ORDER — MORPHINE SULFATE (PF) 4 MG/ML IV SOLN
4.0000 mg | Freq: Once | INTRAVENOUS | Status: AC
  Administered 2023-11-27: 4 mg via INTRAVENOUS

## 2023-11-27 MED ORDER — ASPIRIN 81 MG PO CHEW
81.0000 mg | CHEWABLE_TABLET | Freq: Every day | ORAL | Status: DC
  Administered 2023-11-28 – 2023-11-29 (×2): 81 mg via ORAL
  Filled 2023-11-27 (×2): qty 1

## 2023-11-27 MED ORDER — TICAGRELOR 90 MG PO TABS
ORAL_TABLET | ORAL | Status: AC
  Filled 2023-11-27: qty 2

## 2023-11-27 MED ORDER — CHLORHEXIDINE GLUCONATE CLOTH 2 % EX PADS
6.0000 | MEDICATED_PAD | Freq: Every day | CUTANEOUS | Status: DC
  Administered 2023-11-28 – 2023-11-29 (×2): 6 via TOPICAL

## 2023-11-27 MED ORDER — IOHEXOL 350 MG/ML SOLN
INTRAVENOUS | Status: DC | PRN
  Administered 2023-11-27: 170 mL

## 2023-11-27 MED ORDER — ASPIRIN 81 MG PO TBEC
81.0000 mg | DELAYED_RELEASE_TABLET | Freq: Every day | ORAL | Status: DC
Start: 2023-11-28 — End: 2023-11-27

## 2023-11-27 MED ORDER — LIDOCAINE HCL (PF) 1 % IJ SOLN
INTRAMUSCULAR | Status: DC | PRN
  Administered 2023-11-27: 2 mL

## 2023-11-27 MED ORDER — ASPIRIN 300 MG RE SUPP: 300.0000 mg | RECTAL | Status: DC

## 2023-11-27 MED ORDER — MIDAZOLAM HCL 2 MG/2ML IJ SOLN
INTRAMUSCULAR | Status: AC
  Filled 2023-11-27: qty 2

## 2023-11-27 MED ORDER — HEPARIN SODIUM (PORCINE) 5000 UNIT/ML IJ SOLN
4000.0000 [IU] | Freq: Once | INTRAMUSCULAR | Status: AC
  Administered 2023-11-27: 4000 [IU] via INTRAVENOUS

## 2023-11-27 MED ORDER — LABETALOL HCL 5 MG/ML IV SOLN: 10.0000 mg | INTRAVENOUS | Status: AC | PRN

## 2023-11-27 MED ORDER — ADENOSINE 6 MG/2ML IV SOLN
INTRAVENOUS | Status: AC
Start: 2023-11-27 — End: ?
  Filled 2023-11-27: qty 2

## 2023-11-27 MED ORDER — SODIUM CHLORIDE 0.9% FLUSH: 3.0000 mL | INTRAVENOUS | Status: DC | PRN

## 2023-11-27 SURGICAL SUPPLY — 26 items
BALL SAPPHIRE NC24 2.50X12 (BALLOONS) ×1 IMPLANT
BALL SAPPHIRE NC24 3.0X12 (BALLOONS) ×2 IMPLANT
BALLN EMERGE MR 2.0X12 (BALLOONS) ×1 IMPLANT
BALLN EMERGE MR 2.5X12 (BALLOONS) ×1 IMPLANT
BALLOON EMERGE MR 2.0X12 (BALLOONS) IMPLANT
BALLOON EMERGE MR 2.5X12 (BALLOONS) IMPLANT
BALLOON SAPPHIRE NC24 2.50X12 (BALLOONS) IMPLANT
BALLOON SAPPHIRE NC24 3.0X12 (BALLOONS) IMPLANT
CATH INFINITI 5FR ANG PIGTAIL (CATHETERS) IMPLANT
CATH INFINITI JR4 5F (CATHETERS) IMPLANT
CATH LAUNCHER 6FR EBU3.5 (CATHETERS) IMPLANT
DEVICE RAD COMP TR BAND LRG (VASCULAR PRODUCTS) IMPLANT
GLIDESHEATH SLEND SS 6F .021 (SHEATH) IMPLANT
GUIDEWIRE VAS SION BLUE 190 (WIRE) IMPLANT
KIT ENCORE 26 ADVANTAGE (KITS) IMPLANT
KIT HEMO VALVE WATCHDOG (MISCELLANEOUS) IMPLANT
KIT SYRINGE INJ CVI SPIKEX1 (MISCELLANEOUS) IMPLANT
PACK CARDIAC CATHETERIZATION (CUSTOM PROCEDURE TRAY) ×1 IMPLANT
SET ATX-X65L (MISCELLANEOUS) IMPLANT
STENT SYNERGY XD 2.50X20 (Permanent Stent) IMPLANT
STENT SYNERGY XD 2.75X20 (Permanent Stent) IMPLANT
STENT SYS SYNERGY XD 2.5X20 (Permanent Stent) ×1 IMPLANT
SYNERGY XD 2.75X20 (Permanent Stent) ×1 IMPLANT
WIRE ASAHI PROWATER 180CM (WIRE) IMPLANT
WIRE EMERALD 3MM-J .035X260CM (WIRE) IMPLANT
WIRE MICRO SET SILHO 5FR 7 (SHEATH) IMPLANT

## 2023-11-27 NOTE — ED Notes (Signed)
Transported to cath lab 

## 2023-11-27 NOTE — ED Notes (Signed)
 Patient evaluated by EDP and cardiologist at arrival , family notified by EDP on plan of care /admission .

## 2023-11-27 NOTE — H&P (Signed)
 Cardiology Admission History and Physical   Patient ID: Derrick Johnson MRN: [id number]; DOB: [birth year]   Admission date: 11/27/2023  PCP:  Kaleen Mask, MD   [facility] Providers Cardiologist:  None        Chief Complaint:  stemi  Patient Profile:   Derrick Johnson is a 68 y.o. male with chest pain who is being seen 11/27/2023 for the evaluation of STEMI.  History of Present Illness:   Derrick Johnson presented as a code STEMI with substernal chest pain associated with SOB. ECG showing 3mm anterolateral ST elevation. Vitals stable on admission, although still having ongoing chest pain despite 3 SL nitroglycerin and morphine given by EMS.  Cath with 100% mid LAD occlusion treated with 2 DES, jailed diagonal ballooned as well. Hypotensive after cath lab arrival requiring transient NE support, but weaned off at the end of the case.  Lactate 2.1 that downtrended V gram shows anterolateral akinesis, EDP of 12. Got a dose of ticagrelor in the lab.  H/O colorectal cancer and prior stroke.   Past Medical History:  Diagnosis Date   Asthma    Back contusion    Chronic migraine    Chronic pain    Dissection of cerebral artery (HCC)    Electrocution and nonfatal effects of electric current    Fibromyalgia    Rectal cancer (HCC)    Stroke [facility])     Past Surgical History:  Procedure Laterality Date   CRYO INTERCOSTAL NERVE BLOCK     maxofacial surgery     ROTATOR CUFF REPAIR Left    SPINAL CORD STIMULATOR IMPLANT     SPINE SURGERY     TMJ ARTHROSCOPY       Medications Prior to Admission: Prior to Admission medications   Medication Sig Start Date End Date Taking? Authorizing Provider  albuterol (PROVENTIL) (2.5 MG/3ML) 0.083% nebulizer solution Take 2.5 mg by nebulization every 6 (six) hours as needed for wheezing or shortness of breath.  11/07/19   [provider]  albuterol (VENTOLIN HFA) 108 (90 Base) MCG/ACT inhaler Inhale 1-2 puffs into the lungs every  6 (six) hours as needed for wheezing or shortness of breath. 03/28/17   Ranelle Oyster, MD  amphetamine-dextroamphetamine (ADDERALL) 20 MG tablet Take 1 tablet (20 mg total) by mouth daily. 03/05/19   Ranelle Oyster, MD  bisacodyl (DULCOLAX) 5 MG EC tablet Take 5 mg by mouth daily as needed for moderate constipation.    [provider]  levocetirizine (XYZAL) 5 MG tablet Take 5 mg by mouth every evening.    [provider]  methadone (DOLOPHINE) 10 MG tablet Take 1-2 tablets (10-20 mg total) by mouth every 12 (twelve) hours. Patient taking differently: Take 10 mg by mouth in the morning, at noon, in the evening, and at bedtime.  03/05/19   Ranelle Oyster, MD  MOVANTIK 25 MG TABS tablet Take 25 mg by mouth daily. 10/31/19   [provider]  Multiple Vitamin (MULTIVITAMIN) capsule Take 1 capsule by mouth daily.    [provider]  oxyCODONE (ROXICODONE) 5 MG immediate release tablet Take 1 tablet (5 mg total) by mouth every 8 (eight) hours as needed for severe pain. Patient not taking: Reported on 02/13/2020 01/20/20   Ladene Artist, MD  polyethylene glycol (MIRALAX) 17 g packet Take 17 g by mouth daily as needed for moderate constipation.     [provider]  SUMAtriptan (IMITREX) 100 MG tablet Take 1  tablet (100 mg total) by mouth as directed. May repeat in 2 hours if headache persists or recurs. 03/05/19   Ranelle Oyster, MD  topiramate (TOPAMAX) 50 MG tablet Take 100 mg by mouth at bedtime.  10/29/19   [provider]  traZODone (DESYREL) 150 MG tablet TAKE 1 TABLET BY MOUTH DAILY Patient taking differently: Take 150 mg by mouth at bedtime.  11/21/18   Ranelle Oyster, MD  VITAMIN D-VITAMIN K PO Take 1 capsule by mouth daily. 10,000IU/ 100 mcg    [provider]     Allergies:    Allergies  Allergen Reactions   Penicillins Anaphylaxis    Other reaction(s): Unknown    Social History:   Social History   Socioeconomic  History   Marital status: Married    Spouse name: Not on file   Number of children: Not on file   Years of education: Not on file   Highest education level: Not on file  Occupational History   Not on file  Tobacco Use   Smoking status: Never   Smokeless tobacco: Never  Substance and Sexual Activity   Alcohol use: Not on file   Drug use: No   Sexual activity: Not on file  Other Topics Concern   Not on file  Social History Narrative   Not on file   Social Drivers of Health   Financial Resource Strain: Not on file  Food Insecurity: Not on file  Transportation Needs: Not on file  Physical Activity: Not on file  Stress: Not on file  Social Connections: Not on file  Intimate Partner Violence: Not on file    Family History:   The patient's family history includes Heart disease in his father; Hyperlipidemia in his mother. There is no history of Thyroid disease.    ROS:  Please see the history of present illness.  All other ROS reviewed and negative.     Physical Exam/Data:   Vitals:   11/27/23 2115 11/27/23 2129 11/27/23 2300 11/27/23 2315  BP: (!) 129/113  107/79 112/80  Pulse: 86  76 74  Resp: 15  12 12   SpO2: 100% 97% 94% 97%  Weight:  100 kg 102.1 kg   Height:  5' 8.5" (1.74 m) 5\' 8"  (1.727 m)    No intake or output data in the 24 hours ending 11/27/23 2329    11/27/2023   11:00 PM 11/27/2023    9:29 PM 01/20/2020    8:23 AM  Last 3 Weights  Weight (lbs) 225 lb 1.4 oz 220 lb 7.4 oz 220 lb 6.4 oz  Weight (kg) 102.1 kg 100 kg 99.973 kg     Body mass index is 34.22 kg/m.  General:  Well nourished, well developed, in no acute distress HEENT: normal Neck: no JVD Vascular: No carotid bruits; Distal pulses 2+ bilaterally   Cardiac:  normal S1, S2; RRR; no murmur  Lungs:  clear to auscultation bilaterally, no wheezing, rhonchi or rales  Abd: soft, nontender, no hepatomegaly  Ext: no edema Musculoskeletal:  No deformities, BUE and BLE strength normal and  equal Skin: warm and dry  Neuro:  CNs 2-12 intact, no focal abnormalities noted Psych:  Normal affect    Laboratory Data:  High Sensitivity Troponin:   Recent Labs  Lab 11/27/23 2113  TROPONINIHS 495*      Chemistry Recent Labs  Lab 11/27/23 2113 11/27/23 2121  NA 140 141  K 3.7 3.7  CL 106 102  CO2 26  --  GLUCOSE 113* 111*  BUN <5* <3*  CREATININE 0.96 1.00  CALCIUM 8.9  --   GFRNONAA >60  --   ANIONGAP 8  --     Recent Labs  Lab 11/27/23 2113  PROT 6.7  ALBUMIN 3.6  AST 47*  ALT 60*  ALKPHOS 69  BILITOT 0.5   Hematology Recent Labs  Lab 11/27/23 2113 11/27/23 2121  WBC 9.2  --   RBC 5.41  --   HGB 15.6 15.3  HCT 44.6 45.0  MCV 82.4  --   MCH 28.8  --   MCHC 35.0  --   RDW 13.6  --   PLT 233  --     Radiology/Studies:  CARDIAC CATHETERIZATION Result Date: 11/27/2023   Mid RCA lesion is 30% stenosed.   Mid LAD lesion is 100% stenosed.   A stent was successfully placed.   Post intervention, there is a 0% residual stenosis. 1.  100% mid LAD occlusion treated with 2 overlapping drug-eluting stents with complex PCI requiring kissing balloon inflation to preserve TIMI-3 flow in the jailed diagonal. 2.  Transient cardiogenic shock with a mildly elevated lactate of 2.1 treated with transient norepinephrine which was weaned at the conclusion of the procedure.  Repeat lactate was within normal limits. 3.  Ventriculography demonstrating EF of 35 to 40% with severe anterolateral hypokinesis. 4.  LVEDP of 12 mmHg. Recommendation: Continued dual antiplatelet therapy for 1 year and goal-directed medical therapy for cardiomyopathy.  Obtain echocardiogram.  The patient does have a history of stroke so conversion from ticagrelor is required Plavix therapy should be pursued.     Assessment and Plan:   STEMI now s/p LAD stent x 2. Got ASA and ticagrelor, will continue. Ticagrelor has higher bleeding risk in patients with prior stroke. Starting high intensity  statin. Watch hemodynamics   Risk Assessment/Risk Scores:    TIMI Risk Score for ST  Elevation MI:   The patient's TIMI risk score is 3, which indicates a 4.4% risk of all cause mortality at 30 days.       Code Status: Full Code  Severity of Illness: The appropriate patient status for this patient is INPATIENT. Inpatient status is judged to be reasonable and necessary in order to provide the required intensity of service to ensure the patient's safety. The patient's presenting symptoms, physical exam findings, and initial radiographic and laboratory data in the context of their chronic comorbidities is felt to place them at high risk for further clinical deterioration. Furthermore, it is not anticipated that the patient will be medically stable for discharge from the hospital within 2 midnights of admission.   * I certify that at the point of admission it is my clinical judgment that the patient will require inpatient hospital care spanning beyond 2 midnights from the point of admission due to high intensity of service, high risk for further deterioration and high frequency of surveillance required.*   For questions or updates, please contact [facility] Please consult [email or web address] for contact info under     Signed, Eyvonne Left, MD  11/27/2023 11:29 PM

## 2023-11-27 NOTE — ED Provider Notes (Signed)
 [place] EMERGENCY DEPARTMENT AT [facility] Provider Note   CSN: [id number] Arrival date & time: 11/27/23  2108     History  Chief Complaint  Patient presents with   STEMI/Chest Pain     Derrick Johnson is a 68 y.o. male.  Patient is a 68 year old male with a past medical history of prior CVA, fibromyalgia and migraines presenting to the emergency department with chest pain.  The patient states around 7:30 PM tonight he was at rest when he had sudden onset chest pain.  He states that the pain is radiating into his bilateral shoulders and back.  He denies significant shortness of breath.  States he felt mildly sweaty and nauseous but denies any vomiting.  Patient called the EMS when he had no improvement of pain.  He does report family history of early cardiac disease in his father who died in his 57s.  The history is provided by the patient and the spouse.       Home Medications Prior to Admission medications   Medication Sig Start Date End Date Taking? Authorizing Provider  albuterol (PROVENTIL) (2.5 MG/3ML) 0.083% nebulizer solution Take 2.5 mg by nebulization every 6 (six) hours as needed for wheezing or shortness of breath.  11/07/19   [provider]  albuterol (VENTOLIN HFA) 108 (90 Base) MCG/ACT inhaler Inhale 1-2 puffs into the lungs every 6 (six) hours as needed for wheezing or shortness of breath. 03/28/17   Ranelle Oyster, MD  amphetamine-dextroamphetamine (ADDERALL) 20 MG tablet Take 1 tablet (20 mg total) by mouth daily. 03/05/19   Ranelle Oyster, MD  bisacodyl (DULCOLAX) 5 MG EC tablet Take 5 mg by mouth daily as needed for moderate constipation.    [provider]  levocetirizine (XYZAL) 5 MG tablet Take 5 mg by mouth every evening.    [provider]  methadone (DOLOPHINE) 10 MG tablet Take 1-2 tablets (10-20 mg total) by mouth every 12 (twelve) hours. Patient taking differently: Take 10 mg by mouth in the morning, at noon, in  the evening, and at bedtime.  03/05/19   Ranelle Oyster, MD  MOVANTIK 25 MG TABS tablet Take 25 mg by mouth daily. 10/31/19   [provider]  Multiple Vitamin (MULTIVITAMIN) capsule Take 1 capsule by mouth daily.    [provider]  oxyCODONE (ROXICODONE) 5 MG immediate release tablet Take 1 tablet (5 mg total) by mouth every 8 (eight) hours as needed for severe pain. Patient not taking: Reported on 02/13/2020 01/20/20   Ladene Artist, MD  polyethylene glycol (MIRALAX) 17 g packet Take 17 g by mouth daily as needed for moderate constipation.     [provider]  SUMAtriptan (IMITREX) 100 MG tablet Take 1 tablet (100 mg total) by mouth as directed. May repeat in 2 hours if headache persists or recurs. 03/05/19   Ranelle Oyster, MD  topiramate (TOPAMAX) 50 MG tablet Take 100 mg by mouth at bedtime.  10/29/19   [provider]  traZODone (DESYREL) 150 MG tablet TAKE 1 TABLET BY MOUTH DAILY Patient taking differently: Take 150 mg by mouth at bedtime.  11/21/18   Ranelle Oyster, MD  VITAMIN D-VITAMIN K PO Take 1 capsule by mouth daily. 10,000IU/ 100 mcg    [provider]      Allergies    Penicillins    Review of Systems   Review of Systems  Physical Exam Updated Vital Signs BP 112/80   Pulse  74   Resp 12   Ht 5\' 8"  (1.727 m)   Wt 102.1 kg   SpO2 97%   BMI 34.22 kg/m  Physical Exam Vitals and nursing note reviewed.  Constitutional:      General: He is not in acute distress.    Appearance: Normal appearance. He is ill-appearing.  HENT:     Head: Normocephalic and atraumatic.     Nose: Nose normal.     Mouth/Throat:     Mouth: Mucous membranes are moist.     Pharynx: Oropharynx is clear.  Eyes:     Extraocular Movements: Extraocular movements intact.     Conjunctiva/sclera: Conjunctivae normal.  Cardiovascular:     Rate and Rhythm: Normal rate and regular rhythm.     Pulses: Normal pulses.     Heart sounds: Normal heart  sounds.  Pulmonary:     Effort: Pulmonary effort is normal.     Breath sounds: Normal breath sounds.  Abdominal:     General: Abdomen is flat.     Palpations: Abdomen is soft.     Tenderness: There is no abdominal tenderness.  Musculoskeletal:        General: Normal range of motion.     Cervical back: Normal range of motion.  Skin:    General: Skin is warm and dry.  Neurological:     General: No focal deficit present.     Mental Status: He is alert and oriented to person, place, and time.  Psychiatric:        Mood and Affect: Mood normal.        Behavior: Behavior normal.     ED Results / Procedures / Treatments   Labs (all labs ordered are listed, but only abnormal results are displayed) Labs Reviewed  CBC WITH DIFFERENTIAL/PLATELET - Abnormal; Notable for the following components:      Result Value   Abs Immature Granulocytes 0.18 (*)    All other components within normal limits  COMPREHENSIVE METABOLIC PANEL - Abnormal; Notable for the following components:   Glucose, Bld 113 (*)    BUN <5 (*)    AST 47 (*)    ALT 60 (*)    All other components within normal limits  POCT I-STAT, CHEM 8 - Abnormal; Notable for the following components:   BUN <3 (*)    Glucose, Bld 111 (*)    All other components within normal limits  CG4 I-STAT (LACTIC ACID) - Abnormal; Notable for the following components:   Lactic Acid, Venous 2.1 (*)    All other components within normal limits  CG4 I-STAT (LACTIC ACID) - Abnormal; Notable for the following components:   Lactic Acid, Venous 0.3 (*)    All other components within normal limits  TROPONIN I (HIGH SENSITIVITY) - Abnormal; Notable for the following components:   Troponin I (High Sensitivity) 495 (*)    All other components within normal limits  MRSA NEXT GEN BY PCR, NASAL  PROTIME-INR  LIPOPROTEIN A (LPA)  BASIC METABOLIC PANEL  LIPID PANEL  CBC  POCT ACTIVATED CLOTTING TIME  POCT ACTIVATED CLOTTING TIME  POCT ACTIVATED  CLOTTING TIME  I-STAT CHEM 8, ED  I-STAT CG4 LACTIC ACID, ED  I-STAT CG4 LACTIC ACID, ED    EKG None  Radiology CARDIAC CATHETERIZATION Result Date: 11/27/2023   Mid RCA lesion is 30% stenosed.   Mid LAD lesion is 100% stenosed.   A stent was successfully placed.   Post intervention, there is a 0% residual  stenosis. 1.  100% mid LAD occlusion treated with 2 overlapping drug-eluting stents with complex PCI requiring kissing balloon inflation to preserve TIMI-3 flow in the jailed diagonal. 2.  Transient cardiogenic shock with a mildly elevated lactate of 2.1 treated with transient norepinephrine which was weaned at the conclusion of the procedure.  Repeat lactate was within normal limits. 3.  Ventriculography demonstrating EF of 35 to 40% with severe anterolateral hypokinesis. 4.  LVEDP of 12 mmHg. Recommendation: Continued dual antiplatelet therapy for 1 year and goal-directed medical therapy for cardiomyopathy.  Obtain echocardiogram.  The patient does have a history of stroke so conversion from ticagrelor is required Plavix therapy should be pursued.    Procedures .Critical Care  Performed by: Rexford Maus, DO Authorized by: Rexford Maus, DO   Critical care provider statement:    Critical care time (minutes):  30   Critical care was necessary to treat or prevent imminent or life-threatening deterioration of the following conditions:  Cardiac failure   Critical care was time spent personally by me on the following activities:  Development of treatment plan with patient or surrogate, discussions with consultants, evaluation of patient's response to treatment, examination of patient, ordering and review of laboratory studies, ordering and review of radiographic studies, ordering and performing treatments and interventions, pulse oximetry, re-evaluation of patient's condition and review of old charts     Medications Ordered in ED Medications  nitroGLYCERIN (NITROSTAT) SL  tablet 0.4 mg (has no administration in time range)  nitroGLYCERIN 100 mcg/mL intra-arterial injection (has no administration in time range)  Chlorhexidine Gluconate Cloth 2 % PADS 6 each (has no administration in time range)  labetalol (NORMODYNE) injection 10 mg (has no administration in time range)  hydrALAZINE (APRESOLINE) injection 10 mg (has no administration in time range)  acetaminophen (TYLENOL) tablet 650 mg (has no administration in time range)  ondansetron (ZOFRAN) injection 4 mg (has no administration in time range)  0.9 %  sodium chloride infusion (has no administration in time range)  sodium chloride flush (NS) 0.9 % injection 3 mL (has no administration in time range)  sodium chloride flush (NS) 0.9 % injection 3 mL (has no administration in time range)  0.9 %  sodium chloride infusion (has no administration in time range)  aspirin chewable tablet 81 mg (has no administration in time range)  ticagrelor (BRILINTA) tablet 90 mg (has no administration in time range)  atorvastatin (LIPITOR) tablet 80 mg (has no administration in time range)  lisinopril (ZESTRIL) tablet 5 mg (has no administration in time range)  heparin injection 4,000 Units (4,000 Units Intravenous Given 11/27/23 2115)  morphine (PF) 4 MG/ML injection 4 mg (4 mg Intravenous Given 11/27/23 2117)    ED Course/ Medical Decision Making/ A&P                                 Medical Decision Making This patient presents to the ED with chief complaint(s) of chest pain with pertinent past medical history of CVA, migraines, fibromyalgia which further complicates the presenting complaint. The complaint involves an extensive differential diagnosis and also carries with it a high risk of complications and morbidity.    The differential diagnosis includes ACS, arrhythmia, anemia, pneumonia, pneumothorax, pulmonary edema, pleural effusion  Additional history obtained: Additional history obtained from EMS  Records reviewed  N/A  ED Course and Reassessment: Patient was made a prehospital arrival code STEMI.  Did have  ST elevations laterally with reciprocal ST depressions inferiorly.  On patient's arrival I was immediately present at bedside.  He is hemodynamically stable.  Cardiology arrived at bedside shortly after his arrival.  He is recommended to go to the Cath Lab.  He did receive aspirin and nitro by EMS as well as morphine and is still having pain.  Was given heparin bolus and additional morphine in the ED and was immediately brought to Cath Lab for further management.  Independent labs interpretation:  The following labs were independently interpreted: Elevated troponin otherwise no significant abnormality  Independent visualization of imaging: - N/A  Consultation: - Consulted or discussed management/test interpretation w/ external professional: cardiology  Consideration for admission or further workup: patient requires admission for STEMI Social Determinants of health: N/A    Amount and/or Complexity of Data Reviewed Labs: ordered.  Risk Prescription drug management. Decision regarding hospitalization.          Final Clinical Impression(s) / ED Diagnoses Final diagnoses:  ST elevation myocardial infarction (STEMI), unspecified artery [facility])    Rx / DC Orders ED Discharge Orders          Ordered    AMB Referral to Cardiac Rehabilitation - Phase II        11/27/23 2259              Elayne Snare K, DO 11/27/23 2358

## 2023-11-27 NOTE — Telephone Encounter (Signed)
 Received fax from PCP office requesting patient to re-establish with Dr. Truett Perna for continued monitoring of his rectal cancer. Patient agrees to see Dr. Truett Perna again. Last visit was 2021-explained to office that he would be a new patient referral due to length of time patient has been seen. She understands. Forwarded to navigator.

## 2023-11-27 NOTE — ED Triage Notes (Signed)
 Patient arrived with EMS from home reports sudden onset central chest pain this evening with SOB , took 324 mg ASA and received 3 NTG sl /Morphine 4 mg IV by EMS prior to arrival .

## 2023-11-28 ENCOUNTER — Telehealth (HOSPITAL_COMMUNITY): Payer: Self-pay | Admitting: Pharmacy Technician

## 2023-11-28 ENCOUNTER — Other Ambulatory Visit: Payer: Self-pay | Admitting: *Deleted

## 2023-11-28 ENCOUNTER — Encounter (HOSPITAL_COMMUNITY): Payer: Self-pay | Admitting: Internal Medicine

## 2023-11-28 ENCOUNTER — Other Ambulatory Visit (HOSPITAL_COMMUNITY): Payer: Self-pay

## 2023-11-28 ENCOUNTER — Inpatient Hospital Stay (HOSPITAL_COMMUNITY)

## 2023-11-28 DIAGNOSIS — I2510 Atherosclerotic heart disease of native coronary artery without angina pectoris: Secondary | ICD-10-CM

## 2023-11-28 DIAGNOSIS — C20 Malignant neoplasm of rectum: Secondary | ICD-10-CM

## 2023-11-28 DIAGNOSIS — I2102 ST elevation (STEMI) myocardial infarction involving left anterior descending coronary artery: Secondary | ICD-10-CM | POA: Diagnosis not present

## 2023-11-28 LAB — CBC
HCT: 44.4 % (ref 39.0–52.0)
Hemoglobin: 15.2 g/dL (ref 13.0–17.0)
MCH: 28.3 pg (ref 26.0–34.0)
MCHC: 34.2 g/dL (ref 30.0–36.0)
MCV: 82.7 fL (ref 80.0–100.0)
Platelets: 215 10*3/uL (ref 150–400)
RBC: 5.37 MIL/uL (ref 4.22–5.81)
RDW: 13.7 % (ref 11.5–15.5)
WBC: 9.3 10*3/uL (ref 4.0–10.5)
nRBC: 0 % (ref 0.0–0.2)

## 2023-11-28 LAB — BASIC METABOLIC PANEL
Anion gap: 7 (ref 5–15)
BUN: <5 mg/dL — ABNORMAL LOW (ref 8–23)
CO2: 29 mmol/L (ref 22–32)
Calcium: 9 mg/dL (ref 8.9–10.3)
Chloride: 104 mmol/L (ref 98–111)
Creatinine, Ser: 0.84 mg/dL (ref 0.61–1.24)
GFR, Estimated: >60 mL/min (ref >60)
Glucose, Bld: 115 mg/dL — ABNORMAL HIGH (ref 70–99)
Potassium: 3.7 mmol/L (ref 3.5–5.1)
Sodium: 140 mmol/L (ref 135–145)

## 2023-11-28 LAB — LIPID PANEL
Cholesterol: 190 mg/dL (ref 0–200)
HDL: 41 mg/dL (ref >40)
LDL Cholesterol: 103 mg/dL — ABNORMAL HIGH (ref 0–99)
Total CHOL/HDL Ratio: 4.6 ratio
Triglycerides: 232 mg/dL — ABNORMAL HIGH (ref <150)
VLDL: 46 mg/dL — ABNORMAL HIGH (ref 0–40)

## 2023-11-28 LAB — ECHOCARDIOGRAM COMPLETE
AR max vel: 2.9 cm2
AV Peak grad: 4.8 mmHg
Ao pk vel: 1.09 m/s
Area-P 1/2: 4.06 cm2
Calc EF: 45.1 %
Height: 68 in
S' Lateral: 3.8 cm
Single Plane A2C EF: 45.7 %
Single Plane A4C EF: 46.4 %
Weight: 3601.43 [oz_av]

## 2023-11-28 LAB — PROTIME-INR
INR: 1.1 (ref 0.8–1.2)
Prothrombin Time: 14 s (ref 11.4–15.2)

## 2023-11-28 LAB — MRSA NEXT GEN BY PCR, NASAL: MRSA by PCR Next Gen: NOT DETECTED

## 2023-11-28 MED ORDER — POTASSIUM CHLORIDE CRYS ER 20 MEQ PO TBCR
40.0000 meq | EXTENDED_RELEASE_TABLET | Freq: Once | ORAL | Status: AC
  Administered 2023-11-28: 40 meq via ORAL
  Filled 2023-11-28: qty 2

## 2023-11-28 MED ORDER — ISOSORBIDE MONONITRATE ER 30 MG PO TB24
30.0000 mg | ORAL_TABLET | Freq: Every day | ORAL | Status: DC
  Administered 2023-11-28: 30 mg via ORAL
  Filled 2023-11-28: qty 1

## 2023-11-28 MED ORDER — METHADONE HCL 10 MG PO TABS
10.0000 mg | ORAL_TABLET | Freq: Four times a day (QID) | ORAL | Status: DC
  Administered 2023-11-28 – 2023-11-29 (×6): 10 mg via ORAL
  Filled 2023-11-28 (×6): qty 1

## 2023-11-28 MED ORDER — CLOPIDOGREL BISULFATE 300 MG PO TABS
600.0000 mg | ORAL_TABLET | Freq: Once | ORAL | Status: AC
Start: 2023-11-28 — End: 2023-11-28
  Administered 2023-11-28: 600 mg via ORAL
  Filled 2023-11-28: qty 2

## 2023-11-28 MED ORDER — METOPROLOL SUCCINATE ER 25 MG PO TB24
25.0000 mg | ORAL_TABLET | Freq: Every day | ORAL | Status: DC
  Administered 2023-11-28 – 2023-11-29 (×2): 25 mg via ORAL
  Filled 2023-11-28 (×2): qty 1

## 2023-11-28 MED ORDER — LOSARTAN POTASSIUM 25 MG PO TABS
25.0000 mg | ORAL_TABLET | Freq: Every day | ORAL | Status: DC
  Administered 2023-11-28 – 2023-11-29 (×2): 25 mg via ORAL
  Filled 2023-11-28 (×2): qty 1

## 2023-11-28 MED ORDER — CLOPIDOGREL BISULFATE 75 MG PO TABS
75.0000 mg | ORAL_TABLET | Freq: Every day | ORAL | Status: DC
Start: 2023-11-29 — End: 2023-11-29
  Administered 2023-11-29: 75 mg via ORAL
  Filled 2023-11-28: qty 1

## 2023-11-28 NOTE — Progress Notes (Signed)
 Echocardiogram 2D Echocardiogram has been performed.  Derrick Johnson 11/28/2023, 10:14 AM

## 2023-11-28 NOTE — Telephone Encounter (Signed)
 Patient Product/process development scientist completed.    The patient is insured through [employer]. Patient has Medicare and is not eligible for a copay card, but may be able to apply for patient assistance or Medicare RX Payment Plan (Patient Must reach out to their plan, if eligible for payment plan), if available.    Ran test claim for Brilinta 90 mg and the current 30 day co-pay is $436.47 due to a $590.00 deductible.   This test claim was processed through [facility]- copay amounts may vary at other pharmacies due to pharmacy/plan contracts, or as the patient moves through the different stages of their insurance plan.     Roland Earl, CPHT Pharmacy Technician III Certified Patient Advocate [facility] Pharm[facility] Patient Advocate Team Direct Number: [phone]  Fax: [phone]

## 2023-11-28 NOTE — TOC CM/SW Note (Signed)
 Transition of Care [facility]) - Inpatient Brief Assessment   Patient Details  Name: Derrick Johnson MRN: [id number] Date of Birth: 19-Jun-1956  Transition of Care [facility]) CM/SW Contact:    Gala Lewandowsky, RN Phone Number: 11/28/2023, 11:28 AM   Clinical Narrative: Patient presented for Stemi-post cath Brilinta costly and has been changed to Plavix. PTA patient was independent from home with support of spouse. No home needs identified at the time of visit. Case Manager will continue to follow for additional transition of care needs.    Transition of Care Asessment: Insurance and Status: Insurance coverage has been reviewed Patient has primary care physician: Yes Home environment has been reviewed: reviewed Prior level of function:: indepdnent Prior/Current Home Services: No current home services Social Drivers of Health Review: SDOH reviewed no interventions necessary Readmission risk has been reviewed: Yes Transition of care needs: no transition of care needs at this time

## 2023-11-28 NOTE — Progress Notes (Signed)
 CARDIAC REHAB PHASE I   PRE:  Rate/Rhythm: 76 SR   BP:  Sitting: 140/93      SaO2: 98 RA   MODE:  Ambulation: 370 ft   POST:  Rate/Rhythm: 88 SR   BP:  Sitting: 135/94      SaO2: 97 RA   Pt ambulated independently in hallway. Tolerated well with no CP, SOB or dizziness. Returned to bed with call bell and bedside table in reach. Post MI/stent education including restrictions, risk factors, exercise guidelines, antiplatelet therapy importance, MI booklet, NTG use, heart healthy diet and CRP2 reviewed. All questions and concerns addressed. Will refer to [facility] for CRP2. Will continue to follow.   [id number] Woodroe Chen, RN BSN 11/28/2023 12:31 PM

## 2023-11-28 NOTE — Progress Notes (Signed)
 Referral to Oncology entered per referral received via fax for pt to reestablish care with Dr Truett Perna from Dr Jeannetta Nap

## 2023-11-28 NOTE — Progress Notes (Signed)
   Patient Name: Derrick Johnson Date of Encounter: 11/28/2023 [facility] Cardiologist: None   Interval Summary  .    Overall feeling okay.  No chest tightness.  Complaining of some diffuse discomfort but notes that he has chronic pain and is on methadone at home.  No shortness of breath.  Vital Signs .    Vitals:   11/28/23 0500 11/28/23 0606 11/28/23 0700 11/28/23 0800  BP: 129/83 118/86 131/85 128/88  Pulse: 64 72 88 71  Resp: 18 14 12 11   Temp:      TempSrc:      SpO2: 96% 96% 95% 94%  Weight:      Height:        Intake/Output Summary (Last 24 hours) at 11/28/2023 0839 Last data filed at 11/28/2023 0700 Gross per 24 hour  Intake 240 ml  Output 1800 ml  Net -1560 ml      11/27/2023   11:00 PM 11/27/2023    9:29 PM 01/20/2020    8:23 AM  Last 3 Weights  Weight (lbs) 225 lb 1.4 oz 220 lb 7.4 oz 220 lb 6.4 oz  Weight (kg) 102.1 kg 100 kg 99.973 kg      Telemetry/ECG    Sinus rhythm- Personally Reviewed  Physical Exam .   GEN: No acute distress.   Neck: No JVD Cardiac: RRR, no murmurs, rubs, or gallops.  Respiratory: Clear to auscultation bilaterally. GI: Soft, nontender, non-distended  MS: No edema, right radial cath site is clear  Assessment & Plan .     1.  STEMI involving the LAD: Patient's status post primary PCI with overlapping drug-eluting stents in the LAD and balloon angioplasty of the diagonal.  Acute MI complicated by transient cardiogenic shock in the Cath Lab, but downtrending lactate noted.  LVEF estimated at 35 to 40% by ventriculography and LVEDP was normal at 12 mmHg.  Continue DAPT with aspirin and ticagrelor.  Ticagrelor cost will be $400 upfront until deductible met.  Will transition to clopidogrel (history of stroke not candidate for prasugrel).  Will load with 600 mg clopidogrel this morning followed by 75 mg daily.  Will discontinue ticagrelor. 2.  Acute systolic heart failure, new cardiomyopathy with LVEF 35 to 40%.  Fortunately, LVEDP  was normal and lactate down trended from 2.1-0.3.  No signs of shock.  Patient with adequate blood pressure to initiate GDMT with metoprolol succinate and losartan.  Avoid nonselective beta-blockade with his history of asthma. 3.  Mixed hyperlipidemia: LDL cholesterol 103.  Patient started on high intensity statin drug.  Will need follow-up lipids and LFTs in 8 weeks. 4.  Disposition: Transfer out of ICU and medically stable to floor.  Anticipate hospital discharge in 24 hours.  Check 2D echocardiogram today.  Phase 1 cardiac rehab today.  Anticipate hospital discharge tomorrow.  Follow-up echo to make sure LVEF is greater than 35%.  For questions or updates, please contact [facility] Please consult [email or web address] for contact info under        Signed, Tonny Bollman, MD

## 2023-11-29 ENCOUNTER — Other Ambulatory Visit (HOSPITAL_COMMUNITY): Payer: Self-pay

## 2023-11-29 ENCOUNTER — Encounter (HOSPITAL_COMMUNITY): Payer: Self-pay | Admitting: Internal Medicine

## 2023-11-29 DIAGNOSIS — I255 Ischemic cardiomyopathy: Secondary | ICD-10-CM | POA: Insufficient documentation

## 2023-11-29 DIAGNOSIS — I2102 ST elevation (STEMI) myocardial infarction involving left anterior descending coronary artery: Secondary | ICD-10-CM | POA: Diagnosis not present

## 2023-11-29 LAB — BASIC METABOLIC PANEL
Anion gap: 11 (ref 5–15)
BUN: 6 mg/dL — ABNORMAL LOW (ref 8–23)
CO2: 24 mmol/L (ref 22–32)
Calcium: 9 mg/dL (ref 8.9–10.3)
Chloride: 99 mmol/L (ref 98–111)
Creatinine, Ser: 1.24 mg/dL (ref 0.61–1.24)
GFR, Estimated: >60 mL/min (ref >60)
Glucose, Bld: 139 mg/dL — ABNORMAL HIGH (ref 70–99)
Potassium: 3.7 mmol/L (ref 3.5–5.1)
Sodium: 134 mmol/L — ABNORMAL LOW (ref 135–145)

## 2023-11-29 LAB — CBC
HCT: 42.3 % (ref 39.0–52.0)
Hemoglobin: 14.5 g/dL (ref 13.0–17.0)
MCH: 28.3 pg (ref 26.0–34.0)
MCHC: 34.3 g/dL (ref 30.0–36.0)
MCV: 82.5 fL (ref 80.0–100.0)
Platelets: 210 10*3/uL (ref 150–400)
RBC: 5.13 MIL/uL (ref 4.22–5.81)
RDW: 14 % (ref 11.5–15.5)
WBC: 9.6 10*3/uL (ref 4.0–10.5)
nRBC: 0 % (ref 0.0–0.2)

## 2023-11-29 LAB — MAGNESIUM: Magnesium: 1.8 mg/dL (ref 1.7–2.4)

## 2023-11-29 LAB — LIPOPROTEIN A (LPA): Lipoprotein (a): 15.4 nmol/L (ref <75.0)

## 2023-11-29 MED ORDER — METOPROLOL SUCCINATE ER 25 MG PO TB24
25.0000 mg | ORAL_TABLET | Freq: Every day | ORAL | 2 refills | Status: DC
  Filled 2023-11-29: qty 30, 30d supply, fill #0

## 2023-11-29 MED ORDER — ATORVASTATIN CALCIUM 80 MG PO TABS
80.0000 mg | ORAL_TABLET | Freq: Every day | ORAL | 1 refills | Status: DC
  Filled 2023-11-29: qty 90, 90d supply, fill #0

## 2023-11-29 MED ORDER — ASPIRIN 81 MG PO CHEW
81.0000 mg | CHEWABLE_TABLET | Freq: Every day | ORAL | 2 refills | Status: DC
  Filled 2023-11-29: qty 90, 90d supply, fill #0

## 2023-11-29 MED ORDER — POTASSIUM CHLORIDE CRYS ER 20 MEQ PO TBCR
40.0000 meq | EXTENDED_RELEASE_TABLET | Freq: Once | ORAL | Status: AC
  Administered 2023-11-29: 40 meq via ORAL
  Filled 2023-11-29: qty 2

## 2023-11-29 MED ORDER — CLOPIDOGREL BISULFATE 75 MG PO TABS
75.0000 mg | ORAL_TABLET | Freq: Every day | ORAL | 2 refills | Status: DC
  Filled 2023-11-29: qty 90, 90d supply, fill #0

## 2023-11-29 MED ORDER — MAGNESIUM SULFATE 2 GM/50ML IV SOLN
2.0000 g | Freq: Once | INTRAVENOUS | Status: AC
  Administered 2023-11-29: 2 g via INTRAVENOUS
  Filled 2023-11-29: qty 50

## 2023-11-29 MED ORDER — NITROGLYCERIN 0.4 MG SL SUBL
0.4000 mg | SUBLINGUAL_TABLET | SUBLINGUAL | 2 refills | Status: DC | PRN
  Filled 2023-11-29: qty 25, 5d supply, fill #0

## 2023-11-29 MED ORDER — LOSARTAN POTASSIUM 25 MG PO TABS
25.0000 mg | ORAL_TABLET | Freq: Every day | ORAL | 2 refills | Status: DC
  Filled 2023-11-29: qty 30, 30d supply, fill #0

## 2023-11-29 NOTE — Progress Notes (Signed)
 CARDIAC REHAB PHASE I   Pt resting in bed, feeling well today. No questions or concerns regarding post MI/stent education. Referral for CRP2 sent to [facility]. Plan for discharge home today.   [id number]  Woodroe Chen, RN BSN 11/29/2023 9:13 AM

## 2023-11-29 NOTE — Progress Notes (Signed)
 Heart Failure Nurse Navigator Progress Note  PCP: Kaleen Mask, MD PCP-Cardiologist: None Admission Diagnosis: STEMI Admitted from: Home via EMS  Presentation:   Derrick Johnson presented with sudden onset of central chest pain that radiated to to his shoulders and back, sweaty, and nauseous, shortness of breath, EMS called, BP 112/80, HR 74, Troponin 495, Lactic acid 2.1, taken to cath lab, 100% mid occlusion of left anterior descending artery treated with 2 DES with complex PCI. LVEF 35-40% with severe anterolateral hypokinesis. Acute MI complicated by transient cardiogenic shock in the Cath Lab,  Patient and his wife Eunice Blase were educated on the sign and symptoms of heart failure, daily weights, when to call his doctor or go to the ED, Diet/ fluid restrictions, patient reported to drinking a 2 L bottle of Dr. Reino Kent daily and lots of water. Does add some extra salt to food as well. Continued education on taking all medications as prescribed and attending all medical appointments, patient and his wife verbalized their understanding of education. A HF TOC appointment was scheduled for 12/08/2023 @ 8:45 am.    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    Fibromyalgia    Rectal cancer (HCC)    Stroke [facility])      Social History   Socioeconomic History   Marital status: Married    Spouse name: Not on file   Number of children: Not on file   Years of education: Not on file   Highest education level: Not on file  Occupational History   Not on file  Tobacco Use   Smoking status: Never   Smokeless tobacco: Never  Substance and Sexual Activity   Alcohol use: Not on file   Drug use: No   Sexual activity: Not on file  Other Topics Concern   Not on file  Social History Narrative   Not  on file   Social Drivers of Health   Financial Resource Strain: Not on file  Food Insecurity: Patient Declined (11/28/2023)   Hunger Vital Sign    Worried About Running Out of Food in the Last Year: Patient declined    Ran Out of Food in the Last Year: Patient declined  Transportation Needs: Patient Declined (11/28/2023)   PRAPARE - Administrator, Civil Service (Medical): Patient declined    Lack of Transportation (Non-Medical): Patient declined  Physical Activity: Not on file  Stress: Not on file  Social Connections: Unknown (11/28/2023)   Social Connection and Isolation Panel [NHANES]    Frequency of Communication with Friends and Family: Three times a week    Frequency of Social Gatherings with Friends and Family: Three times a week    Attends Religious Services: Patient declined    Active Member of Clubs or Organizations: Patient declined    Attends Banker Meetings: Patient declined    Marital Status: Married   Water engineer and Provision:  Detailed education and instructions provided on heart failure disease management including the following:  Signs and symptoms of Heart Failure When to call the physician Importance of daily weights Low sodium diet Fluid restriction Medication management Anticipated future follow-up appointments  Patient education given on each of the above topics.  Patient acknowledges understanding via teach back  method and acceptance of all instructions.  Education Materials:  "Living Better With Heart Failure" Booklet, HF zone tool, & Daily Weight Tracker Tool.  Patient has scale at home: yes Patient has pill box at home: yes    High Risk Criteria for Readmission and/or Poor Patient Outcomes: Heart failure hospital admissions (last 6 months): 0  No Show rate: 0 Difficult social situation: No, lives with his wife Demonstrates medication adherence: Yes Primary Language: English Literacy level: Reading, writing,  and comprehension  Barriers of Care:   Diet/ fluid restrictions ( Dr. Reino Kent 2L bottle daily + water) salt use Daily weights  Considerations/Referrals:   Referral made to Heart Failure Pharmacist Stewardship: Yes Referral made to Heart Failure CSW/NCM TOC: NA Referral made to Heart & Vascular TOC clinic: Yes,   Items for Follow-up on DC/TOC: Continued HF education Diet/ fluid restrictions/ daily weights ( 2 Litter bottle of Dr. Reino Kent daily, + water) salt use   Rhae Hammock, BSN, RN Heart Failure Teacher, adult education Only

## 2023-11-29 NOTE — Plan of Care (Signed)
  Problem: Clinical Measurements: Goal: Diagnostic test results will improve Outcome: Progressing Goal: Respiratory complications will improve Outcome: Progressing Goal: Cardiovascular complication will be avoided Outcome: Progressing   Problem: Activity: Goal: Risk for activity intolerance will decrease Outcome: Progressing   Problem: Pain Managment: Goal: General experience of comfort will improve and/or be controlled Outcome: Progressing

## 2023-11-29 NOTE — Progress Notes (Signed)
   Patient Name: Derrick Johnson Date of Encounter: 11/29/2023 [facility] Cardiologist: None   Interval Summary  .    Patient feeling well with no chest pain or shortness of breath.  No complaints today.  Vital Signs .    Vitals:   11/29/23 0600 11/29/23 0629 11/29/23 0700 11/29/23 0800  BP: 93/63   96/60  Pulse: 70   76  Resp: 17   16  Temp:   98.7 F (37.1 C)   TempSrc:   Oral   SpO2: 92%   91%  Weight:  99.8 kg    Height:        Intake/Output Summary (Last 24 hours) at 11/29/2023 0823 Last data filed at 11/29/2023 0600 Gross per 24 hour  Intake 360 ml  Output 2000 ml  Net -1640 ml      11/29/2023    6:29 AM 11/27/2023   11:00 PM 11/27/2023    9:29 PM  Last 3 Weights  Weight (lbs) 220 lb 0.3 oz 225 lb 1.4 oz 220 lb 7.4 oz  Weight (kg) 99.8 kg 102.1 kg 100 kg      Telemetry/ECG    Sinus rhythm with no significant arrhythmia - Personally Reviewed  Physical Exam .   GEN: No acute distress.   Neck: No JVD Cardiac: RRR, no murmurs, rubs, or gallops.  Respiratory: Clear to auscultation bilaterally. GI: Soft, nontender, non-distended  MS: No edema  Assessment & Plan .     STEMI involving the LAD: S/P primary PCI with overlapping DES in the LAD. Transitioned to plavix yesterday due to ticagrelor cost.  Acute systolic HF: BP soft. Stop imdur. Continue low dose losartan and metoprolol succinate. Echo reviewed - LVEF 40-45%, normal RV function, no valvular disease. No congestive symptoms or need for loop diuretic .  Dyslipidemia: high-intensity statin initiated.   Dispo: medically stable for home today.  Reviewed post PCI instructions.  I think the patient is on maximally tolerated medical therapy.  Will arrange outpatient cardiology follow-up.  Discussed phase 2 cardiac rehab with him.  Medically stable for discharge today.  For questions or updates, please contact [facility] Please consult [email or web address] for contact info under         Signed, Tonny Bollman, MD

## 2023-11-29 NOTE — Progress Notes (Signed)
   Heart Failure Stewardship Pharmacist Progress Note   PCP: Kaleen Mask, MD PCP-Cardiologist: None    HPI:  68 y.o. male with PMHx of CVA, ADHD, asthma, hyperthyroidism, HLD, fibromyalgia, migraines, adenocarcinoma of the rectum, and syringomyelia.   He presented to [facility] ED on 03/24 with complaints of sudden, radiating chest pain with SOB. ECG done by EMS revealed STEMI. He was given ASA 324 mg by EMS and 3 NTG sublingual tablets. Upon arrival he underwent cardiac catheterization which revealed 100% mid occlusion of left anterior descending artery treated with 2 DES with complex PCI. LVEF 35-40% with severe anterolateral hypokinesis. Initial signs of transient shock (lactate ~2) requiring brief vasopressor support with norepinephrine. He was started on DAPT (ASA + Ticagrelor--> Plavix). Lactate has since decreased and patient shows no signs of shock. ECHO on 03/25 revealed LVEF of 40-45%, LV demonstrated mildly decreased function, regional wall motion abnormalities, LAD and apical hypokinesis without evidence of LV thrombus, MV is normal, trivial MV regurgitation, and no evidence of mitral stenosis.   Today patient states he is doing well. No SOB, DOE, or complaints of lower extremity swelling. He owns a pill organizer and scale at home.   Current HF Medications: Beta Blocker: Metoprolol succinate 25 mg QD ACE/ARB/ARNI: Losartan 25 mg QD Other: Imdur 30 mg every day   Prior to admission HF Medications: None  Pertinent Lab Values: Serum creatinine 1.24 (<0.84), BUN 6 (<5), Potassium 3.7 (<3.7), Sodium 134 (<140), BNP n/a, Magnesium 1.8 A1c 5.4 (2020),  Vital Signs: Weight: 220.02 lbs (admission weight: 225.1 lbs) Blood pressure: 83/64-149/112 (96/60 @ 0800)  Heart rate: 61-88 (76 @0800 )  I/O: net -2.9 L yesterday; net -3.2 L since admission  Medication Assistance / Insurance Benefits Check: Does the patient have prescription insurance?  Yes Type of insurance plan:  Medicare   Outpatient Pharmacy:  Prior to admission outpatient pharmacy:  [facility] - [place], [place] - [address]  Is the patient willing to use [facility]OC pharmacy at discharge? Yes Is the patient willing to transition their outpatient pharmacy to utilize a [facility] outpatient pharmacy?   No    Assessment: 1. Acute HFrEF (LVEF 40-45% due to ICM/STEMI. NYHA class I symptoms. -Strict I/Os and daily weights. Keep K>4, Mg>2.  - Continue Metoprolol succinate 25 mg every day -Continue Losartan 25 mg every day -Continue Imdur 30 mg every day -Consider initiation of Spironolactone and SGLT2i pending volume status, hypotension resolution, and kidney function at follow up -Will require updated A1c at follow up for SGLT2i consideration   Plan: 1) Medication changes recommended at this time: -Consider initiation of Spironolactone and SGLT2i pending volume status, hypotension resolution, and kidney function at follow up  2) Patient assistance: -Patient is agreeable to using TOC pharmacy at discharge  3)  Education  - Patient has been educated on current HF medications and potential additions to HF medication regimen - Patient verbalizes understanding that over the next few months, these medication doses may change and more medications may be added to optimize HF regimen - Patient has been educated on basic disease state pathophysiology and goals of therapy   Sofie Rower, PharmD [employer] PGY1

## 2023-11-29 NOTE — Discharge Summary (Addendum)
 Discharge Summary    Patient ID: Derrick Johnson MRN: [id number]; DOB: 1956-03-10  Admit date: 11/27/2023 Discharge date: 11/29/2023  PCP:  Kaleen Mask, MD   [facility] Providers Cardiologist:  Orbie Pyo, MD   }    Discharge Diagnoses    Principal Problem:   STEMI (ST elevation myocardial infarction) [facility]) Active Problems:   Chronic pain syndrome   Dyslipidemia (high LDL; low HDL)   Acute ST elevation myocardial infarction (STEMI) due to occlusion of mid portion of left anterior descending (LAD) coronary artery [facility])   Ischemic cardiomyopathy    Diagnostic Studies/Procedures    Cath: 11/28/2023    Mid RCA lesion is 30% stenosed.   Mid LAD lesion is 100% stenosed.   A stent was successfully placed.   Post intervention, there is a 0% residual stenosis.   1.  100% mid LAD occlusion treated with 2 overlapping drug-eluting stents with complex PCI requiring kissing balloon inflation to preserve TIMI-3 flow in the jailed diagonal. 2.  Transient cardiogenic shock with a mildly elevated lactate of 2.1 treated with transient norepinephrine which was weaned at the conclusion of the procedure.  Repeat lactate was within normal limits. 3.  Ventriculography demonstrating EF of 35 to 40% with severe anterolateral hypokinesis. 4.  LVEDP of 12 mmHg.   Recommendation: Continued dual antiplatelet therapy for 1 year and goal-directed medical therapy for cardiomyopathy.  Obtain echocardiogram.  The patient does have a history of stroke so conversion from ticagrelor is required Plavix therapy should be pursued.   Diagnostic Dominance: Right  Intervention     Echo: 11/28/2023  IMPRESSIONS     1. Left ventricular ejection fraction, by estimation, is 40 to 45%. The  left ventricle has mildly decreased function. The left ventricle  demonstrates regional wall motion abnormalities (see scoring  diagram/findings for description). Left ventricular  diastolic  parameters were normal. LAD and apical hypokinesis without  evidence of LV thrombus.   2. Right ventricular systolic function is normal. The right ventricular  size is normal.   3. The mitral valve is normal in structure. Trivial mitral valve  regurgitation. No evidence of mitral stenosis.   4. The aortic valve is tricuspid. Aortic valve regurgitation is not  visualized. Aortic valve sclerosis is present, with no evidence of aortic  valve stenosis.   5. Aortic dilatation noted. 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   Comparison(s): No prior Echocardiogram.   FINDINGS   Left Ventricle: Ventricular strain and hypertrophy not well performed.  Left ventricular ejection fraction, by estimation, is 40 to 45%. The left  ventricle has mildly decreased function. The left ventricle demonstrates  regional wall motion abnormalities.   Strain was performed and the global longitudinal strain is indeterminate.  The left ventricular internal cavity size was normal in size. There is no  left ventricular hypertrophy. Left ventricular diastolic parameters were  normal.     LV Wall Scoring:  The entire anterior wall, entire septum, apical lateral segment, and  apical  inferior segment are hypokinetic. LAD and apical hypokinesis without  evidence  of LV thrombus.   Right Ventricle: The right ventricular size is normal. No increase in  right ventricular wall thickness. Right ventricular systolic function is  normal.   Left Atrium: Left atrial size was normal in size.   Right Atrium: Right atrial size was normal in size.   Pericardium: Trivial pericardial effusion is present. The pericardial  effusion is circumferential.  Mitral Valve: The mitral valve is normal in structure. Trivial mitral  valve regurgitation. No evidence of mitral valve stenosis.   Tricuspid Valve: The tricuspid valve is normal in structure. Tricuspid  valve regurgitation is not demonstrated. No  evidence of tricuspid  stenosis.   Aortic Valve: The aortic valve is tricuspid. Aortic valve regurgitation is  not visualized. Aortic valve sclerosis is present, with no evidence of  aortic valve stenosis. Aortic valve peak gradient measures 4.8 mmHg.   Pulmonic Valve: The pulmonic valve was normal in structure. Pulmonic valve  regurgitation is not visualized. No evidence of pulmonic stenosis.   Aorta: Aortic dilatation noted. There is mild dilatation of the aortic  arch, measuring 34 mm.   IAS/Shunts: The atrial septum is grossly normal.   Additional Comments: 3D was performed not requiring image post processing  on an independent workstation and was indeterminate.   _____________   History of Present Illness     Derrick Johnson is a 68 y.o. male with PMH of chronic pain, ADHD, fibromyalgia who presented with chest pain and found to have STEMI.  Presented via EMS with substernal chest pain and shortness of breath with EKG showing anterior lateral ST elevation.  He was having ongoing chest pain on arrival despite 3 sublingual nitroglycerin and morphine via EMS.  As a result he was transferred emergently to the cardiac Cath Lab for further evaluation.  Hospital Course    STEMI -- Underwent cardiac catheterization on 3/24 with 100% occluded mid LAD treated with PCI/DES x 2 with overlapping stents requiring kissing balloon inflation to preserve flow into jailed diagonal.  Recommendations for DAPT with aspirin/Plavix for at least 1 year.  Able to ambulate with cardiac rehab without recurrent chest pain. -- Continue aspirin, Plavix, high-dose statin, low-dose metoprolol succinate and losartan  Acute systolic HF ICM -- Echocardiogram showed LVEF of 40 to 45%, normal RV, LAD and apical wall hypokinesis without any evidence of LV thrombus. -- GDMT: Low-dose metoprolol succinate and losartan.  BP soft but tolerating.  HLD -- LDL 103, HDL 41 -- Continue atorvastatin 80 mg daily -- Will need  LFT/FLP in 8 weeks  Chronic pain -- Continue home dose methadone   Patient seen by Dr. Excell Seltzer and deemed stable for discharge home.  Follow-up arranged in the office.  Medication sent to [facility] pharmacy.  Educated by [employer].D. prior to discharge.  Did the patient have an acute coronary syndrome (MI, NSTEMI, STEMI, etc) this admission?:  Yes                               AHA/ACC ACS Clinical Performance & Quality Measures: Aspirin prescribed? - Yes ADP Receptor Inhibitor (Plavix/Clopidogrel, Brilinta/Ticagrelor or Effient/Prasugrel) prescribed (includes medically managed patients)? - Yes Beta Blocker prescribed? - Yes High Intensity Statin (Lipitor 40-80mg  or Crestor 20-40mg ) prescribed? - Yes EF assessed during THIS hospitalization? - Yes For EF <40%, was ACEI/ARB prescribed? - Yes For EF <40%, Aldosterone Antagonist (Spironolactone or Eplerenone) prescribed? - No - Reason:  consider outpatient if BP stable Cardiac Rehab Phase II ordered (including medically managed patients)? - Yes       The patient will be scheduled for a TOC follow up appointment in 10-14 days.  A message has been sent to the [facility] and Scheduling Pool at the office where the patient should be seen for follow up.  _____________  Discharge Vitals Blood pressure (!) 109/94, pulse 74, temperature 98.7  F (37.1 C), temperature source Oral, resp. rate 16, height 5\' 8"  (1.727 m), weight 99.8 kg, SpO2 91%.  Filed Weights   11/27/23 2129 11/27/23 2300 11/29/23 0629  Weight: 100 kg 102.1 kg 99.8 kg    Labs & Radiologic Studies    CBC Recent Labs    11/27/23 2113 11/27/23 2121 11/28/23 0227 11/29/23 0802  WBC 9.2  --  9.3 9.6  NEUTROABS 6.0  --   --   --   HGB 15.6   < > 15.2 14.5  HCT 44.6   < > 44.4 42.3  MCV 82.4  --  82.7 82.5  PLT 233  --  215 210   < > = values in this interval not displayed.   Basic Metabolic Panel Recent Labs    16/10/96 0227 11/29/23 0802  NA 140 134*  K 3.7 3.7  CL 104 99   CO2 29 24  GLUCOSE 115* 139*  BUN <5* 6*  CREATININE 0.84 1.24  CALCIUM 9.0 9.0  MG  --  1.8   Liver Function Tests Recent Labs    11/27/23 2113  AST 47*  ALT 60*  ALKPHOS 69  BILITOT 0.5  PROT 6.7  ALBUMIN 3.6   No results for input(s): "LIPASE", "AMYLASE" in the last 72 hours. High Sensitivity Troponin:   Recent Labs  Lab 11/27/23 2113  TROPONINIHS 495*    BNP Invalid input(s): "POCBNP" D-Dimer No results for input(s): "DDIMER" in the last 72 hours. Hemoglobin A1C No results for input(s): "HGBA1C" in the last 72 hours. Fasting Lipid Panel Recent Labs    11/28/23 0227  CHOL 190  HDL 41  LDLCALC 103*  TRIG 232*  CHOLHDL 4.6   Thyroid Function Tests No results for input(s): "TSH", "T4TOTAL", "T3FREE", "THYROIDAB" in the last 72 hours.  Invalid input(s): "FREET3" _____________  ECHOCARDIOGRAM COMPLETE Result Date: 11/28/2023    ECHOCARDIOGRAM REPORT   Patient Name:   Derrick Johnson [facility] Date of Exam: 11/28/2023 Medical Rec #:  [id number]    Height:       68.0 in Accession #:    [id number]   Weight:       225.1 lb Date of Birth:  May 22, 1956    BSA:          2.149 m Patient Age:    68 years     BP:           131/85 mmHg Patient Gender: M            HR:           70 bpm. Exam Location:  Inpatient Procedure: 2D Echo, Cardiac Doppler and Color Doppler (Both Spectral and Color            Flow Doppler were utilized during procedure). Indications:    CAD Native Vessel I25.10  History:        Patient has no prior history of Echocardiogram examinations.                 Acute MI; Risk Factors:Dyslipidemia.  Sonographer:    Lucendia Herrlich RCS Referring Phys: [id number] Eyvonne Left IMPRESSIONS  1. Left ventricular ejection fraction, by estimation, is 40 to 45%. The left ventricle has mildly decreased function. The left ventricle demonstrates regional wall motion abnormalities (see scoring diagram/findings for description). Left ventricular diastolic parameters were normal. LAD and apical  hypokinesis without evidence of LV thrombus.  2. Right ventricular systolic function is normal. The right ventricular size is normal.  3.  The mitral valve is normal in structure. Trivial mitral valve regurgitation. No evidence of mitral stenosis.  4. The aortic valve is tricuspid. Aortic valve regurgitation is not visualized. Aortic valve sclerosis is present, with no evidence of aortic valve stenosis.  5. Aortic dilatation noted. There is mild dilatation of the aortic arch, measuring 34 mm. Comparison(s): No prior Echocardiogram. FINDINGS  Left Ventricle: Ventricular strain and hypertrophy not well performed. Left ventricular ejection fraction, by estimation, is 40 to 45%. The left ventricle has mildly decreased function. The left ventricle demonstrates regional wall motion abnormalities.  Strain was performed and the global longitudinal strain is indeterminate. The left ventricular internal cavity size was normal in size. There is no left ventricular hypertrophy. Left ventricular diastolic parameters were normal.  LV Wall Scoring: The entire anterior wall, entire septum, apical lateral segment, and apical inferior segment are hypokinetic. LAD and apical hypokinesis without evidence of LV thrombus. Right Ventricle: The right ventricular size is normal. No increase in right ventricular wall thickness. Right ventricular systolic function is normal. Left Atrium: Left atrial size was normal in size. Right Atrium: Right atrial size was normal in size. Pericardium: Trivial pericardial effusion is present. The pericardial effusion is circumferential. Mitral Valve: The mitral valve is normal in structure. Trivial mitral valve regurgitation. No evidence of mitral valve stenosis. Tricuspid Valve: The tricuspid valve is normal in structure. Tricuspid valve regurgitation is not demonstrated. No evidence of tricuspid stenosis. Aortic Valve: The aortic valve is tricuspid. Aortic valve regurgitation is not visualized. Aortic  valve sclerosis is present, with no evidence of aortic valve stenosis. Aortic valve peak gradient measures 4.8 mmHg. Pulmonic Valve: The pulmonic valve was normal in structure. Pulmonic valve regurgitation is not visualized. No evidence of pulmonic stenosis. Aorta: Aortic dilatation noted. There is mild dilatation of the aortic arch, measuring 34 mm. IAS/Shunts: The atrial septum is grossly normal. Additional Comments: 3D was performed not requiring image post processing on an independent workstation and was indeterminate.  LEFT VENTRICLE PLAX 2D LVIDd:         5.00 cm      Diastology LVIDs:         3.80 cm      LV e' medial:    10.10 cm/s LV PW:         0.90 cm      LV E/e' medial:  7.2 LV IVS:        1.10 cm      LV e' lateral:   11.90 cm/s LVOT diam:     2.20 cm      LV E/e' lateral: 6.1 LV SV:         62 LV SV Index:   29 LVOT Area:     3.80 cm  LV Volumes (MOD) LV vol d, MOD A2C: 123.0 ml LV vol d, MOD A4C: 136.0 ml LV vol s, MOD A2C: 66.8 ml LV vol s, MOD A4C: 72.9 ml LV SV MOD A2C:     56.2 ml LV SV MOD A4C:     136.0 ml LV SV MOD BP:      58.2 ml RIGHT VENTRICLE RV S prime:     13.10 cm/s TAPSE (M-mode): 2.2 cm LEFT ATRIUM             Index        RIGHT ATRIUM          Index LA diam:        4.40 cm 2.05 cm/m   RA  Area:     8.69 cm LA Vol (A2C):   43.1 ml 20.06 ml/m  RA Volume:   15.40 ml 7.17 ml/m LA Vol (A4C):   33.7 ml 15.68 ml/m LA Biplane Vol: 39.2 ml 18.24 ml/m  AORTIC VALVE AV Area (Vmax): 2.90 cm AV Vmax:        109.00 cm/s AV Peak Grad:   4.8 mmHg LVOT Vmax:      83.10 cm/s LVOT Vmean:     52.100 cm/s LVOT VTI:       0.163 m  AORTA Ao Root diam: 3.50 cm Ao Asc diam:  3.60 cm MITRAL VALVE               TRICUSPID VALVE MV Area (PHT): 4.06 cm    TR Peak grad:   20.1 mmHg MV Decel Time: 187 msec    TR Vmax:        224.00 cm/s MV E velocity: 72.60 cm/s MV A velocity: 63.20 cm/s  SHUNTS MV E/A ratio:  1.15        Systemic VTI:  0.16 m                            Systemic Diam: 2.20 cm Riley Lam MD Electronically signed by Riley Lam MD Signature Date/Time: 11/28/2023/11:10:25 AM    Final    CARDIAC CATHETERIZATION Result Date: 11/27/2023   Mid RCA lesion is 30% stenosed.   Mid LAD lesion is 100% stenosed.   A stent was successfully placed.   Post intervention, there is a 0% residual stenosis. 1.  100% mid LAD occlusion treated with 2 overlapping drug-eluting stents with complex PCI requiring kissing balloon inflation to preserve TIMI-3 flow in the jailed diagonal. 2.  Transient cardiogenic shock with a mildly elevated lactate of 2.1 treated with transient norepinephrine which was weaned at the conclusion of the procedure.  Repeat lactate was within normal limits. 3.  Ventriculography demonstrating EF of 35 to 40% with severe anterolateral hypokinesis. 4.  LVEDP of 12 mmHg. Recommendation: Continued dual antiplatelet therapy for 1 year and goal-directed medical therapy for cardiomyopathy.  Obtain echocardiogram.  The patient does have a history of stroke so conversion from ticagrelor is required Plavix therapy should be pursued.   Disposition   Pt is being discharged home today in good condition.  Follow-up Plans & Appointments     Follow-up Information     [facility]. Go in 9 day(s).   Specialty: Cardiology Why: Hospital follow up 12/08/2023 @ 8:45 am PLEASE bring a current medication list to appointment FREE valet parking, Entrance C, off [employer] information: [address] [place] [address] [phone]               Discharge Instructions     Amb Referral to Cardiac Rehabilitation   Complete by: As directed    Diagnosis:  Coronary Stents STEMI     After initial evaluation and assessments completed: Virtual Based Care may be provided alone or in conjunction with Phase 2 Cardiac Rehab based on patient barriers.: Yes   Intensive Cardiac Rehabilitation (ICR) [facility]  location only OR Traditional Cardiac Rehabilitation (TCR) *If criteria for ICR are not met will enroll in TCR [facility] only): Yes   Call MD for:  difficulty breathing, headache or visual disturbances   Complete by: As directed    Call MD for:  persistant dizziness or light-headedness  Complete by: As directed    Call MD for:  redness, tenderness, or signs of infection (pain, swelling, redness, odor or green/yellow discharge around incision site)   Complete by: As directed    Diet - low sodium heart healthy   Complete by: As directed    Discharge instructions   Complete by: As directed    Radial Site Care Refer to this sheet in the next few weeks. These instructions provide you with information on caring for yourself after your procedure. Your caregiver may also give you more specific instructions. Your treatment has been planned according to current medical practices, but problems sometimes occur. Call your caregiver if you have any problems or questions after your procedure. HOME CARE INSTRUCTIONS You may shower the day after the procedure. Remove the bandage (dressing) and gently wash the site with plain soap and water. Gently pat the site dry.  Do not apply powder or lotion to the site.  Do not submerge the affected site in water for 3 to 5 days.  Inspect the site at least twice daily.  Do not flex or bend the affected arm for 24 hours.  No lifting over 5 pounds (2.3 kg) for 5 days after your procedure.  Do not drive home if you are discharged the same day of the procedure. Have someone else drive you.  You may drive 24 hours after the procedure unless otherwise instructed by your caregiver.  What to expect: Any bruising will usually fade within 1 to 2 weeks.  Blood that collects in the tissue (hematoma) may be painful to the touch. It should usually decrease in size and tenderness within 1 to 2 weeks.  SEEK IMMEDIATE MEDICAL CARE IF: You have unusual pain at the radial site.  You have  redness, warmth, swelling, or pain at the radial site.  You have drainage (other than a small amount of blood on the dressing).  You have chills.  You have a fever or persistent symptoms for more than 72 hours.  You have a fever and your symptoms suddenly get worse.  Your arm becomes pale, cool, tingly, or numb.  You have heavy bleeding from the site. Hold pressure on the site.   PLEASE DO NOT MISS ANY DOSES OF YOUR PLAVIX!!!!! Also keep a log of you blood pressures and bring back to your follow up appt. Please call the office with any questions.   Patients taking blood thinners should generally stay away from medicines like ibuprofen, Advil, Motrin, naproxen, and Aleve due to risk of stomach bleeding. You may take Tylenol as directed or talk to your primary doctor about alternatives.   PLEASE ENSURE THAT YOU DO NOT RUN OUT OF YOUR PLAVIX. This medication is very important to remain on for at least one year. IF you have issues obtaining this medication due to cost please CALL the office 3-5 business days prior to running out in order to prevent missing doses of this medication.   Increase activity slowly   Complete by: As directed         Discharge Medications   Allergies as of 11/29/2023       Reactions   Penicillins Anaphylaxis   Latex Other (See Comments)   Skin irritation        Medication List     STOP taking these medications    ibuprofen 200 MG tablet Commonly known as: ADVIL   sulfamethoxazole-trimethoprim 800-160 MG tablet Commonly known as: BACTRIM DS   SUMAtriptan 100 MG tablet  Commonly known as: IMITREX       TAKE these medications    albuterol 108 (90 Base) MCG/ACT inhaler Commonly known as: Ventolin HFA Inhale 1-2 puffs into the lungs every 6 (six) hours as needed for wheezing or shortness of breath.   albuterol (2.5 MG/3ML) 0.083% nebulizer solution Commonly known as: PROVENTIL Take 2.5 mg by nebulization every 6 (six) hours as needed for  wheezing or shortness of breath.   aspirin 81 MG chewable tablet Chew 1 tablet (81 mg total) by mouth daily. Start taking on: November 30, 2023   atorvastatin 80 MG tablet Commonly known as: LIPITOR Take 1 tablet (80 mg total) by mouth daily. Start taking on: November 30, 2023   clopidogrel 75 MG tablet Commonly known as: PLAVIX Take 1 tablet (75 mg total) by mouth daily. Start taking on: November 30, 2023   cyclobenzaprine 10 MG tablet Commonly known as: FLEXERIL Take 10 mg by mouth every 8 (eight) hours as needed for muscle spasms.   losartan 25 MG tablet Commonly known as: COZAAR Take 1 tablet (25 mg total) by mouth daily. Start taking on: November 30, 2023   methadone 10 MG tablet Commonly known as: DOLOPHINE Take 1-2 tablets (10-20 mg total) by mouth every 12 (twelve) hours. What changed:  how much to take when to take this   methylphenidate 20 MG tablet Commonly known as: RITALIN Take 20 mg by mouth 3 (three) times daily.   metoprolol succinate 25 MG 24 hr tablet Commonly known as: TOPROL-XL Take 1 tablet (25 mg total) by mouth daily. Start taking on: November 30, 2023   MiraLax 17 g packet Generic drug: polyethylene glycol Take 17 g by mouth daily.   multivitamin capsule Take 1 capsule by mouth daily.   nitroGLYCERIN 0.4 MG SL tablet Commonly known as: NITROSTAT Place 1 tablet (0.4 mg total) under the tongue every 5 (five) minutes x 3 doses as needed for chest pain.   tamsulosin 0.4 MG Caps capsule Commonly known as: FLOMAX Take 0.4-0.8 mg by mouth See admin instructions. Take 0.4mg  (1 capsule) by mouth daily for 2 weeks, then increase to 0.8mg  (2 capsules) daily,   traZODone 150 MG tablet Commonly known as: DESYREL TAKE 1 TABLET BY MOUTH DAILY What changed:  how much to take when to take this reasons to take this   VITAMIN D-VITAMIN K PO Take 1 capsule by mouth daily. 10,000IU/ 100 mcg        Outstanding Labs/Studies   BMET at follow up FLP/LFTs in 8  weeks  Duration of Discharge Encounter: APP Time: 25 minutes   Signed, Laverda Page, NP 11/29/2023, 11:49 AM   Patient seen, examined. Available data reviewed. Agree with findings, assessment, and plan as outlined by Laverda Page, NP.  Please see my full progress note the same date.  Physical exam documented in that note, but pertinent for the patient in no distress, normal JVP, heart regular rate and rhythm no murmur gallop, lungs clear bilaterally, radial cath site clear with no hematoma or ecchymosis, no lower extremity edema.  Telemetry was reviewed and shows normal sinus rhythm with no significant arrhythmia.  The patient was transition to clopidogrel yesterday due to ticagrelor cost.  He will be on DAPT with aspirin and clopidogrel for at least 12 months without interruption.  His post MI LVEF is 40 to 45% and he is tolerating low doses of losartan and metoprolol succinate.  His blood pressure is in the low normal range and will not  allow for more aggressive GDMT at present.  He is having no congestive symptoms or evidence of volume overload.  He has been started on a high intensity statin drug for treatment of his dyslipidemia.  MD time is equal to 30 minutes and includes review of his medical therapy, personal time spent with physical exam and discharge counseling  Tonny Bollman, M.D. 11/29/2023 12:33 PM

## 2023-12-05 NOTE — Progress Notes (Signed)
 HEART & VASCULAR TRANSITION OF CARE CONSULT NOTE    Referring Physician: Kaleen Mask, MD   Chief Complaint: Heart failure  HPI: Referred to clinic by Dr. Excell Seltzer for heart failure consultation.   Derrick Johnson is a 68 y.o. male with recently diagnosed heart failure with mid-range EF, recent NSTEMI, hx of rectal cancer, HLD, ADHD, CVA, fibromyalgia and chronic pain.   He was recently admitted 3/25 with chest pain and SOB. EKG showed anterior lateral ST elevatation. He was taken to the cath lab which showed 100% occluded mid LAD treated with PCI/DES x2 with overlapping stents requiring kissing balloon inflation to preserve flow into jailed diagonal. Recommended DAPT with ASA/plavix for at least 1 year. Echo showed EF 40-45%, LV with RWMA, RV normal, trivial MR. Started on some GDMT. Discharge weight 220lbs.   Today he presents for AHF [facility] clinic visit with his wife. Overall feeling ok just constantly fatigued. Denies palpitations, CP, or PND/Orthopnea. Occasionally gets ankle edema. Dizziness today when getting out of the car. Gets SOB at baseline but is minimally active. Appetite great. Him and his wife watch what they eat. Do not eat out and they don't use salt at home. No fever or chills. Weight at home 221-223 pounds. Taking all medications. Denies ETOH, tobacco or drug use. Has been seeing PCP regularly for methadone for chronic pain, has spinal cord implant. Is on disability.   Cardiac family history: dad died at 70 from MI. Mom with a fib.     Past Medical History:  Diagnosis Date   Asthma    Back contusion    Chronic migraine    Chronic pain    Dissection of cerebral artery (HCC)    Electrocution and nonfatal effects of electric current    Fibromyalgia    Rectal cancer (HCC)    Stroke [facility])     Current Outpatient Medications  Medication Sig Dispense Refill   albuterol (PROVENTIL) (2.5 MG/3ML) 0.083% nebulizer solution Take 2.5 mg by nebulization every 6 (six) hours  as needed for wheezing or shortness of breath.      albuterol (VENTOLIN HFA) 108 (90 Base) MCG/ACT inhaler Inhale 1-2 puffs into the lungs every 6 (six) hours as needed for wheezing or shortness of breath. 1 Inhaler 5   aspirin 81 MG chewable tablet Chew 1 tablet (81 mg total) by mouth daily. 90 tablet 2   atorvastatin (LIPITOR) 80 MG tablet Take 1 tablet (80 mg total) by mouth daily. 90 tablet 1   clopidogrel (PLAVIX) 75 MG tablet Take 1 tablet (75 mg total) by mouth daily. 90 tablet 2   cyclobenzaprine (FLEXERIL) 10 MG tablet Take 10 mg by mouth every 8 (eight) hours as needed for muscle spasms.     losartan (COZAAR) 25 MG tablet Take 1 tablet (25 mg total) by mouth daily. 30 tablet 2   methadone (DOLOPHINE) 10 MG tablet Take 1-2 tablets (10-20 mg total) by mouth every 12 (twelve) hours. (Patient taking differently: Take 10 mg by mouth in the morning, at noon, in the evening, and at bedtime.) 66 tablet 0   methylphenidate (RITALIN) 20 MG tablet Take 20 mg by mouth 3 (three) times daily.     metoprolol succinate (TOPROL-XL) 25 MG 24 hr tablet Take 1 tablet (25 mg total) by mouth daily. 30 tablet 2   Multiple Vitamin (MULTIVITAMIN) capsule Take 1 capsule by mouth daily.     nitroGLYCERIN (NITROSTAT) 0.4 MG SL tablet Place 1 tablet (0.4 mg total) under  the tongue every 5 (five) minutes x 3 doses as needed for chest pain. 25 tablet 2   polyethylene glycol (MIRALAX) 17 g packet Take 17 g by mouth daily.     tamsulosin (FLOMAX) 0.4 MG CAPS capsule Take 0.4-0.8 mg by mouth See admin instructions. Take 0.4mg  (1 capsule) by mouth daily for 2 weeks, then increase to 0.8mg  (2 capsules) daily,     traZODone (DESYREL) 150 MG tablet TAKE 1 TABLET BY MOUTH DAILY (Patient taking differently: Take 75 mg by mouth at bedtime as needed for sleep.) 30 tablet 4   VITAMIN D-VITAMIN K PO Take 1 capsule by mouth daily. 10,000IU/ 100 mcg     No current facility-administered medications for this encounter.    Facility-Administered Medications Ordered in Other Encounters  Medication Dose Route Frequency Provider Last Rate Last Admin   clindamycin (CLEOCIN) 900 mg in dextrose 5 % 50 mL IVPB  900 mg Intravenous 60 min Pre-Op Romie Levee, MD       And   gentamicin (GARAMYCIN) 5 mg/kg in dextrose 5 % 50 mL IVPB  5 mg/kg Intravenous 60 min Pre-Op Romie Levee, MD        Allergies  Allergen Reactions   Penicillins Anaphylaxis   Latex Other (See Comments)    Skin irritation   Social History   Socioeconomic History   Marital status: Married    Spouse name: [name]   Number of children: 0   Years of education: Not on file   Highest education level: High school graduate  Occupational History   Occupation: Disability  Tobacco Use   Smoking status: Never   Smokeless tobacco: Never  Vaping Use   Vaping status: Never Used  Substance and Sexual Activity   Alcohol use: Not Currently   Drug use: No   Sexual activity: Not on file  Other Topics Concern   Not on file  Social History Narrative   Not on file   Social Drivers of Health   Financial Resource Strain: Low Risk  (11/29/2023)   Overall Financial Resource Strain (CARDIA)    Difficulty of Paying Living Expenses: Not very hard  Food Insecurity: Patient Declined (11/28/2023)   Hunger Vital Sign    Worried About Running Out of Food in the Last Year: Patient declined    Ran Out of Food in the Last Year: Patient declined  Transportation Needs: Patient Declined (11/28/2023)   PRAPARE - Administrator, Civil Service (Medical): Patient declined    Lack of Transportation (Non-Medical): Patient declined  Physical Activity: Not on file  Stress: Not on file  Social Connections: Unknown (11/28/2023)   Social Connection and Isolation Panel [NHANES]    Frequency of Communication with Friends and Family: Three times a week    Frequency of Social Gatherings with Friends and Family: Three times a week    Attends Religious Services:  Patient declined    Active Member of Clubs or Organizations: Patient declined    Attends Banker Meetings: Patient declined    Marital Status: Married  Catering manager Violence: Not At Risk (11/28/2023)   Humiliation, Afraid, Rape, and Kick questionnaire    Fear of Current or Ex-Partner: No    Emotionally Abused: No    Physically Abused: No    Sexually Abused: No    Family History  Problem Relation Age of Onset   Hyperlipidemia Mother    Heart disease Father    Thyroid disease Neg Hx     Vitals:  12/08/23 0842  BP: (!) 152/100  Pulse: 98  SpO2: 98%  Weight: 103.5 kg (228 lb 3.2 oz)    PHYSICAL EXAM: General:  restless appearing (baseline 2/2 pain).  No respiratory difficulty Neck: supple. JVD difficult to see, does not appear elevated.  Cor: PMI nondisplaced. Regular rate & rhythm. No rubs, gallops or murmurs. Lungs: clear Extremities: no cyanosis, clubbing, rash, edema  Neuro: alert & oriented x 3. Moves all 4 extremities w/o difficulty. Affect pleasant.   Wt Readings from Last 3 Encounters:  12/08/23 103.5 kg (228 lb 3.2 oz)  11/29/23 99.8 kg (220 lb 0.3 oz)  01/20/20 100 kg (220 lb 6.4 oz)    ECG: NSR 94 bpm (Personally reviewed)    ReDs reading: 39 %, abnormal    ASSESSMENT & PLAN: Recently diagnosed heart failure with mid-range EF, iCM - LHC 3/24 100% occluded mid LAD treated with PCI/DES x2 with overlapping stents requiring kissing balloon inflation to preserve flow into jailed diagonal.  - Echo showed EF 40-45%, LV with RWMA, RV normal, trivial MR NYHA II-IIIa (limited with pain) Volume difficult to gauge on exam but with increased SOB, weight gain and ReDS clip 39% suggesting of fluid.  GDMT  Diuretic-  Start lasix 20 mg daily  BB- Continue toprol-XL 25 mg daily, switch to QHS Ace/ARB/ARNI- Continue Losartan 25 mg daily >plan for eventual Entresto  MRA- Start spiro 12.5 mg daily, BMET today, repeat BMET in 7-1 days SGLT2i No SGLT2i with  history of frequent UTIs that require multiple rounds of abx to fully clear - Would repeat echo in 3 months.   CAD - LHC 3/24 100% occluded mid LAD treated with PCI/DES x2 with overlapping stents requiring kissing balloon inflation to preserve flow into jailed diagonal.  - Continue ASA, Plavix,  - Continue statin  HLD - LDL 103 - Continue statin - Repeat lipid panel in 2 months  4. Fatigue - check anemia panel - Change BB to QHS - Encouraged to be more active as tolerated  5. History of colon cancer - Stopped seeing cancer center in 2021.  - Has upcoming appt arranged to reestablish care    Referred to HFSW (PCP, Medications, Transportation, ETOH Abuse, Drug Abuse, Insurance, Financial ): No Refer to Pharmacy: No Refer to Home Health:  No Refer to Advanced Heart Failure Clinic: No  Refer to General Cardiology: Yes (has f/u 4/21 with gen cards)  Follow up in 2 weeks to follow up on fluid and adjust GDMT (switch to Entresto, increase spiro) Will need ReDS again.

## 2023-12-07 ENCOUNTER — Telehealth (HOSPITAL_COMMUNITY): Payer: Self-pay

## 2023-12-07 NOTE — Telephone Encounter (Signed)
 Called to confirm/remind patient of their appointment at the [facility] on 12/08/2023 8:45.   Appointment:   [] Confirmed  [] Left mess   [x] No answer/No voice mail/Mail Box Full  [] Phone not in service  Patient reminded to bring all medications and/or complete list.  Confirmed patient has transportation. Gave directions, instructed to utilize valet parking.

## 2023-12-08 ENCOUNTER — Encounter (HOSPITAL_COMMUNITY): Payer: Self-pay

## 2023-12-08 ENCOUNTER — Ambulatory Visit (HOSPITAL_COMMUNITY)
Admission: RE | Admit: 2023-12-08 | Discharge: 2023-12-08 | Disposition: A | Discharge status: Home / Self Care | Source: Ambulatory Visit | Attending: Family Medicine | Admitting: Family Medicine

## 2023-12-08 VITALS — BP 152/100 | HR 98 | Wt 228.2 lb

## 2023-12-08 DIAGNOSIS — I2510 Atherosclerotic heart disease of native coronary artery without angina pectoris: Secondary | ICD-10-CM | POA: Diagnosis not present

## 2023-12-08 DIAGNOSIS — Z7902 Long term (current) use of antithrombotics/antiplatelets: Secondary | ICD-10-CM | POA: Diagnosis not present

## 2023-12-08 DIAGNOSIS — Z85038 Personal history of other malignant neoplasm of large intestine: Secondary | ICD-10-CM | POA: Diagnosis not present

## 2023-12-08 DIAGNOSIS — Z8349 Family history of other endocrine, nutritional and metabolic diseases: Secondary | ICD-10-CM | POA: Insufficient documentation

## 2023-12-08 DIAGNOSIS — Z79899 Other long term (current) drug therapy: Secondary | ICD-10-CM | POA: Diagnosis not present

## 2023-12-08 DIAGNOSIS — G8929 Other chronic pain: Secondary | ICD-10-CM | POA: Diagnosis not present

## 2023-12-08 DIAGNOSIS — M797 Fibromyalgia: Secondary | ICD-10-CM | POA: Insufficient documentation

## 2023-12-08 DIAGNOSIS — Z85048 Personal history of other malignant neoplasm of rectum, rectosigmoid junction, and anus: Secondary | ICD-10-CM | POA: Diagnosis not present

## 2023-12-08 DIAGNOSIS — E785 Hyperlipidemia, unspecified: Secondary | ICD-10-CM | POA: Diagnosis not present

## 2023-12-08 DIAGNOSIS — Z8249 Family history of ischemic heart disease and other diseases of the circulatory system: Secondary | ICD-10-CM | POA: Diagnosis not present

## 2023-12-08 DIAGNOSIS — I252 Old myocardial infarction: Secondary | ICD-10-CM | POA: Insufficient documentation

## 2023-12-08 DIAGNOSIS — C189 Malignant neoplasm of colon, unspecified: Secondary | ICD-10-CM

## 2023-12-08 DIAGNOSIS — Z7982 Long term (current) use of aspirin: Secondary | ICD-10-CM | POA: Diagnosis not present

## 2023-12-08 DIAGNOSIS — Z955 Presence of coronary angioplasty implant and graft: Secondary | ICD-10-CM | POA: Insufficient documentation

## 2023-12-08 DIAGNOSIS — R5383 Other fatigue: Secondary | ICD-10-CM | POA: Diagnosis not present

## 2023-12-08 DIAGNOSIS — Z8673 Personal history of transient ischemic attack (TIA), and cerebral infarction without residual deficits: Secondary | ICD-10-CM | POA: Insufficient documentation

## 2023-12-08 DIAGNOSIS — I5022 Chronic systolic (congestive) heart failure: Secondary | ICD-10-CM | POA: Diagnosis not present

## 2023-12-08 LAB — BASIC METABOLIC PANEL WITH GFR
Anion gap: 10 (ref 5–15)
BUN: 5 mg/dL — ABNORMAL LOW (ref 8–23)
CO2: 26 mmol/L (ref 22–32)
Calcium: 8.8 mg/dL — ABNORMAL LOW (ref 8.9–10.3)
Chloride: 100 mmol/L (ref 98–111)
Creatinine, Ser: 0.97 mg/dL (ref 0.61–1.24)
GFR, Estimated: >60 mL/min (ref >60)
Glucose, Bld: 121 mg/dL — ABNORMAL HIGH (ref 70–99)
Potassium: 3.9 mmol/L (ref 3.5–5.1)
Sodium: 136 mmol/L (ref 135–145)

## 2023-12-08 LAB — IRON AND TIBC
Iron: 104 ug/dL (ref 45–182)
Saturation Ratios: 27 % (ref 17.9–39.5)
TIBC: 385 ug/dL (ref 250–450)
UIBC: 281 ug/dL

## 2023-12-08 LAB — FERRITIN: Ferritin: 116 ng/mL (ref 24–336)

## 2023-12-08 LAB — BRAIN NATRIURETIC PEPTIDE: B Natriuretic Peptide: 133.2 pg/mL — ABNORMAL HIGH (ref 0.0–100.0)

## 2023-12-08 MED ORDER — METOPROLOL SUCCINATE ER 25 MG PO TB24: 25.0000 mg | ORAL_TABLET | Freq: Every day | ORAL | Status: DC

## 2023-12-08 MED ORDER — FUROSEMIDE 20 MG PO TABS: 20.0000 mg | ORAL_TABLET | Freq: Every day | ORAL | 3 refills | Status: AC

## 2023-12-08 MED ORDER — SPIRONOLACTONE 25 MG PO TABS: 12.5000 mg | ORAL_TABLET | Freq: Every day | ORAL | 3 refills | Status: AC

## 2023-12-08 NOTE — Patient Instructions (Signed)
 Medication Changes:  START SPIRONOLACTONE 12.5MG  ONCE DAILY   TAKE METOPROLOL 25MG  AT BEDTIME   START LASIX (FUROSEMIDE) 20MG  ONCE DAILY   Lab Work:  Labs done today, your results will be available in MyChart, we will contact you for abnormal readings.'  Follow-Up in:  2WEEKS AS SCHEDULED WITH TOC CLINIC  At the [facility], you and your health needs are our priority. We have a designated team specialized in the treatment of Heart Failure. This Care Team includes your primary Heart Failure Specialized Cardiologist (physician), Advanced Practice Providers (APPs- Physician Assistants and Nurse Practitioners), and Pharmacist who all work together to provide you with the care you need, when you need it.   You may see any of the following providers on your designated Care Team at your next follow up:  Dr. Arvilla Meres Dr. Marca Ancona Dr. Dorthula Nettles Dr. Theresia Bough Tonye Becket, NP Robbie Lis, [place] [facility] [place], [place] Brynda Peon, NP [place] Lee, NP Karle Plumber, PharmD   Please be sure to bring in all your medications bottles to every appointment.   Need to Contact us:  If you have any questions or concerns before your next appointment please send [place] a message through [place] or call our office at [phone].    TO LEAVE A MESSAGE FOR THE NURSE SELECT OPTION 2, PLEASE LEAVE A MESSAGE INCLUDING: YOUR NAME DATE OF BIRTH CALL BACK NUMBER REASON FOR CALL**this is important as we prioritize the call backs  YOU WILL RECEIVE A CALL BACK THE SAME DAY AS LONG AS YOU CALL BEFORE 4:00 PM

## 2023-12-08 NOTE — Progress Notes (Signed)
 ReDS Vest / Clip - 12/08/23 0932       ReDS Vest / Clip   Station Marker C    Ruler Value 30    ReDS Value Range Moderate volume overload    ReDS Actual Value 39

## 2023-12-11 ENCOUNTER — Other Ambulatory Visit

## 2023-12-11 ENCOUNTER — Inpatient Hospital Stay: Admitting: Oncology

## 2023-12-11 ENCOUNTER — Ambulatory Visit: Admitting: Cardiology

## 2023-12-12 ENCOUNTER — Telehealth (HOSPITAL_COMMUNITY): Payer: Self-pay

## 2023-12-12 NOTE — Telephone Encounter (Signed)
 Called patient to see if he is interested in the Cardiac Rehab Program. Patient expressed interest. Explained scheduling process and went over insurance, patient verbalized understanding. Will contact patient for scheduling once f/u has been completed.

## 2023-12-12 NOTE — Telephone Encounter (Signed)
 Pt insurance is active and benefits verified through Medicare a/b Co-pay 0, DED $257/$257 met, out of pocket 0/0 met, co-insurance 20%. no pre-authorization required. Passport, 12/12/23@2 :31, REF# [id number]   How many CR sessions are covered? (72 visits for ICR)72 Is this a lifetime maximum or an annual maximum? annual Has the member used any of these services to date? no Is there a time limit (weeks/months) on start of program and/or program completion? no     Will contact patient to see if he is interested in the Cardiac Rehab Program. If interested, patient will need to complete follow up appt. Once completed, patient will be contacted for scheduling upon review by the RN Navigator.

## 2023-12-15 ENCOUNTER — Ambulatory Visit (HOSPITAL_COMMUNITY)
Admission: RE | Admit: 2023-12-15 | Discharge: 2023-12-15 | Disposition: A | Discharge status: Home / Self Care | Source: Ambulatory Visit | Attending: Cardiology | Admitting: Cardiology

## 2023-12-15 DIAGNOSIS — I5022 Chronic systolic (congestive) heart failure: Secondary | ICD-10-CM | POA: Insufficient documentation

## 2023-12-15 LAB — BASIC METABOLIC PANEL WITH GFR
Anion gap: 11 (ref 5–15)
BUN: 6 mg/dL — ABNORMAL LOW (ref 8–23)
CO2: 27 mmol/L (ref 22–32)
Calcium: 9.5 mg/dL (ref 8.9–10.3)
Chloride: 100 mmol/L (ref 98–111)
Creatinine, Ser: 1.03 mg/dL (ref 0.61–1.24)
GFR, Estimated: >60 mL/min (ref >60)
Glucose, Bld: 110 mg/dL — ABNORMAL HIGH (ref 70–99)
Potassium: 4.5 mmol/L (ref 3.5–5.1)
Sodium: 138 mmol/L (ref 135–145)

## 2023-12-19 ENCOUNTER — Telehealth: Payer: Self-pay | Admitting: Oncology

## 2023-12-19 NOTE — Telephone Encounter (Signed)
 Request per patient wanted to push appt out, patient just experience a heart attack. Per patient appt push for following month.

## 2023-12-19 NOTE — Progress Notes (Addendum)
 HEART & VASCULAR TRANSITION OF CARE CONSULT NOTE    Referring Physician: Candiss Chamorro, MD  Cardiologist: Dr. Lorie Rook  Chief Complaint: Heart failure  HPI: Derrick Johnson is a 68 y.o. male with recently diagnosed heart failure with mid-range EF, recent NSTEMI, hx of rectal cancer, HLD, ADHD, CVA, fibromyalgia and chronic pain on methadone  and has spinal cord implant.   He was admitted 3/25 with anterior STEMI. Cath with 100% m LAD treated with PCI/DES x2 with overlapping stents requiring kissing balloon inflation to preserve flow into jailed diagonal. Echo demonstrated EF 40-45%, RWMA, RV normal, trivial MR. Started on GDMT. Discharge weight 220lbs.   He was seen in [facility] clinic 12/08/23. Volume status elevated. ReDS 39%. Started on 20 mg lasix  daily + 12.5 mg spiro daily.  Here today for HF follow-up. Has been doing well. His clinic weight is down 7 lb, home weight down 6 lb from last visit. Notes chronic dyspnea with exertion which he attributes at least in part to asthma. Mobility primarily limited by chronic musculoskeletal and back pain. On methadone  chronically for pain. Eager to start cardiac rehab. No orthopnea or PND. Chest occasionally feels tight but denies any chest pain reminiscent of his prior angina. He does not use salt shaker and looks at food labels. Has cut back fluid intake. Taking all medications as prescribed.  Denies ETOH, tobacco or drug use.   Cardiac family history: dad died at 24 from MI. Mom with a fib.     Past Medical History:  Diagnosis Date   Asthma    Back contusion    Chronic migraine    Chronic pain    Dissection of cerebral artery (HCC)    Electrocution and nonfatal effects of electric current    Fibromyalgia    Rectal cancer (HCC)    Stroke [facility])     Current Outpatient Medications  Medication Sig Dispense Refill   albuterol  (PROVENTIL ) (2.5 MG/3ML) 0.083% nebulizer solution Take 2.5 mg by nebulization every 6 (six) hours as needed for  wheezing or shortness of breath.      albuterol  (VENTOLIN  HFA) 108 (90 Base) MCG/ACT inhaler Inhale 1-2 puffs into the lungs every 6 (six) hours as needed for wheezing or shortness of breath. 1 Inhaler 5   aspirin  81 MG chewable tablet Chew 1 tablet (81 mg total) by mouth daily. 90 tablet 2   atorvastatin  (LIPITOR) 80 MG tablet Take 1 tablet (80 mg total) by mouth daily. 90 tablet 1   clopidogrel  (PLAVIX ) 75 MG tablet Take 1 tablet (75 mg total) by mouth daily. 90 tablet 2   cyclobenzaprine (FLEXERIL) 10 MG tablet Take 10 mg by mouth every 8 (eight) hours as needed for muscle spasms.     furosemide  (LASIX ) 20 MG tablet Take 1 tablet (20 mg total) by mouth daily. 90 tablet 3   losartan  (COZAAR ) 25 MG tablet Take 1 tablet (25 mg total) by mouth daily. 30 tablet 2   methadone  (DOLOPHINE ) 10 MG tablet Take 1-2 tablets (10-20 mg total) by mouth every 12 (twelve) hours. (Patient taking differently: Take 10 mg by mouth in the morning, at noon, in the evening, and at bedtime.) 66 tablet 0   methylphenidate (RITALIN) 20 MG tablet Take 20 mg by mouth 3 (three) times daily.     metoprolol  succinate (TOPROL -XL) 25 MG 24 hr tablet Take 1 tablet (25 mg total) by mouth at bedtime.     Multiple Vitamin (MULTIVITAMIN) capsule Take 1 capsule by mouth daily.  polyethylene glycol (MIRALAX) 17 g packet Take 17 g by mouth daily.     spironolactone  (ALDACTONE ) 25 MG tablet Take 0.5 tablets (12.5 mg total) by mouth daily. 45 tablet 3   tamsulosin (FLOMAX) 0.4 MG CAPS capsule Take 0.4-0.8 mg by mouth See admin instructions. Take 0.4mg  (1 capsule) by mouth daily for 2 weeks, then increase to 0.8mg  (2 capsules) daily,     traZODone  (DESYREL ) 150 MG tablet TAKE 1 TABLET BY MOUTH DAILY (Patient taking differently: Take 75 mg by mouth at bedtime as needed for sleep.) 30 tablet 4   VITAMIN D -VITAMIN K PO Take 1 capsule by mouth daily. 10,000IU/ 100 mcg     nitroGLYCERIN  (NITROSTAT ) 0.4 MG SL tablet Place 1 tablet (0.4 mg  total) under the tongue every 5 (five) minutes x 3 doses as needed for chest pain. (Patient not taking: Reported on 12/22/2023) 25 tablet 2   No current facility-administered medications for this encounter.   Facility-Administered Medications Ordered in Other Encounters  Medication Dose Route Frequency Provider Last Rate Last Admin   clindamycin  (CLEOCIN ) 900 mg in dextrose  5 % 50 mL IVPB  900 mg Intravenous 60 min Pre-Op Joyce Nixon, MD       And   gentamicin  (GARAMYCIN ) 5 mg/kg in dextrose  5 % 50 mL IVPB  5 mg/kg Intravenous 60 min Pre-Op Joyce Nixon, MD        Allergies  Allergen Reactions   Penicillins Anaphylaxis   Latex Other (See Comments)    Skin irritation   Social History   Socioeconomic History   Marital status: Married    Spouse name: Derrick Johnson   Number of children: 0   Years of education: Not on file   Highest education level: High school graduate  Occupational History   Occupation: Disability  Tobacco Use   Smoking status: Never   Smokeless tobacco: Never  Vaping Use   Vaping status: Never Used  Substance and Sexual Activity   Alcohol use: Not Currently   Drug use: No   Sexual activity: Not on file  Other Topics Concern   Not on file  Social History Narrative   Not on file   Social Drivers of Health   Financial Resource Strain: Low Risk  (11/29/2023)   Overall Financial Resource Strain (CARDIA)    Difficulty of Paying Living Expenses: Not very hard  Food Insecurity: Patient Declined (11/28/2023)   Hunger Vital Sign    Worried About Running Out of Food in the Last Year: Patient declined    Ran Out of Food in the Last Year: Patient declined  Transportation Needs: Patient Declined (11/28/2023)   PRAPARE - Administrator, Civil Service (Medical): Patient declined    Lack of Transportation (Non-Medical): Patient declined  Physical Activity: Not on file  Stress: Not on file  Social Connections: Unknown (11/28/2023)   Social Connection and  Isolation Panel [NHANES]    Frequency of Communication with Friends and Family: Three times a week    Frequency of Social Gatherings with Friends and Family: Three times a week    Attends Religious Services: Patient declined    Active Member of Clubs or Organizations: Patient declined    Attends Banker Meetings: Patient declined    Marital Status: Married  Catering manager Violence: Not At Risk (11/28/2023)   Humiliation, Afraid, Rape, and Kick questionnaire    Fear of Current or Ex-Partner: No    Emotionally Abused: No    Physically Abused: No  Sexually Abused: No    Family History  Problem Relation Age of Onset   Hyperlipidemia Mother    Heart disease Father    Thyroid  disease Neg Hx     Vitals:   12/22/23 0930  BP: 122/80  Pulse: 87  SpO2: 96%  Weight: 100.3 kg (221 lb 3.2 oz)  Height: 5\' 8"  (1.727 m)   Wt Readings from Last 3 Encounters:  12/22/23 100.3 kg (221 lb 3.2 oz)  12/08/23 103.5 kg (228 lb 3.2 oz)  11/29/23 99.8 kg (220 lb 0.3 oz)    PHYSICAL EXAM: General:  Well appearing.  Neck: no JVD.  Cor: Regular rate & rhythm. No rubs, gallops or murmurs. Lungs: clear Abdomen: soft, nontender, nondistended.  Extremities: no edema Neuro: alert & orientedx3. Affect pleasant  ReDS: 36%    ASSESSMENT & PLAN: HFmrEF, iCM - LHC 11/27/23 100% mid LAD treated with PCI/DES x2 with overlapping stents requiring kissing balloon inflation to preserve flow into jailed diagonal.  - Echo 03/25 EF 40-45%, RWMA, RV normal, trivial MR - NYHA status difficult, mobility limited by chronic pain - Volume okay on exam, ReDS 36%. Continue lasix  20 mg daily.  - Will switch losartan  to entresto  24/26 mg BID which will help with volume.  - Continue toprol -XL 25 QHS - Continue spiro 12.5 mg daily - Consider Increasing spiro at follow-up - No SGLT2i with history of frequent UTIs that require multiple rounds of abx to fully clear - Labs today - Would repeat echo in 3  months.   CAD - Recent anterior STEMI 03/25 with PCI as above - No angina - Continue ASA, Plavix  - Continue statin - Encouraged cardiac rehab  HLD - LDL 103 03/25 - Continue high-intensity statin - Repeat lipid panel in 2 months - Goal LDL < 55, management per Cardiology  4. History of colon cancer - Stopped seeing cancer center in 2021.  - Has upcoming appt arranged to reestablish care    Referred to HFSW (PCP, Medications, Transportation, ETOH Abuse, Drug Abuse, Insurance, Financial ): No Refer to Pharmacy: Yes, check on cost/assistance for Entresto  Refer to Home Health:  No Refer to Advanced Heart Failure Clinic: No  Refer to General Cardiology: No, already established (has f/u 4/21)  Follow up: PRN, keep follow-up with Cardiology

## 2023-12-21 ENCOUNTER — Telehealth (HOSPITAL_COMMUNITY): Payer: Self-pay

## 2023-12-21 NOTE — Telephone Encounter (Signed)
 Called to confirm/remind patient of their appointment at the [facility] on 12/22/2023 9:30.   Appointment:   [] Confirmed  [] Left mess   [x] No answer/No voice mail  [] Phone not in service   Patient reminded to bring all medications and/or complete list.  Confirmed patient has transportation. Gave directions, instructed to utilize valet parking.

## 2023-12-22 ENCOUNTER — Other Ambulatory Visit (HOSPITAL_COMMUNITY): Payer: Self-pay

## 2023-12-22 ENCOUNTER — Ambulatory Visit (HOSPITAL_COMMUNITY)
Admission: RE | Admit: 2023-12-22 | Discharge: 2023-12-22 | Disposition: A | Discharge status: Home / Self Care | Source: Ambulatory Visit | Attending: Physician Assistant | Admitting: Physician Assistant

## 2023-12-22 VITALS — BP 122/80 | HR 87 | Ht 68.0 in | Wt 221.2 lb

## 2023-12-22 DIAGNOSIS — E782 Mixed hyperlipidemia: Secondary | ICD-10-CM | POA: Diagnosis not present

## 2023-12-22 DIAGNOSIS — E785 Hyperlipidemia, unspecified: Secondary | ICD-10-CM | POA: Insufficient documentation

## 2023-12-22 DIAGNOSIS — Z7902 Long term (current) use of antithrombotics/antiplatelets: Secondary | ICD-10-CM | POA: Diagnosis not present

## 2023-12-22 DIAGNOSIS — Z79899 Other long term (current) drug therapy: Secondary | ICD-10-CM | POA: Diagnosis not present

## 2023-12-22 DIAGNOSIS — I5022 Chronic systolic (congestive) heart failure: Secondary | ICD-10-CM | POA: Insufficient documentation

## 2023-12-22 DIAGNOSIS — Z955 Presence of coronary angioplasty implant and graft: Secondary | ICD-10-CM | POA: Diagnosis not present

## 2023-12-22 DIAGNOSIS — I252 Old myocardial infarction: Secondary | ICD-10-CM | POA: Insufficient documentation

## 2023-12-22 DIAGNOSIS — Z8673 Personal history of transient ischemic attack (TIA), and cerebral infarction without residual deficits: Secondary | ICD-10-CM | POA: Insufficient documentation

## 2023-12-22 DIAGNOSIS — G8929 Other chronic pain: Secondary | ICD-10-CM | POA: Diagnosis not present

## 2023-12-22 DIAGNOSIS — I2510 Atherosclerotic heart disease of native coronary artery without angina pectoris: Secondary | ICD-10-CM | POA: Insufficient documentation

## 2023-12-22 DIAGNOSIS — Z7982 Long term (current) use of aspirin: Secondary | ICD-10-CM | POA: Diagnosis not present

## 2023-12-22 DIAGNOSIS — I255 Ischemic cardiomyopathy: Secondary | ICD-10-CM | POA: Diagnosis not present

## 2023-12-22 DIAGNOSIS — M549 Dorsalgia, unspecified: Secondary | ICD-10-CM | POA: Insufficient documentation

## 2023-12-22 DIAGNOSIS — J45909 Unspecified asthma, uncomplicated: Secondary | ICD-10-CM | POA: Diagnosis not present

## 2023-12-22 LAB — BASIC METABOLIC PANEL WITH GFR
Anion gap: 9 (ref 5–15)
BUN: 10 mg/dL (ref 8–23)
CO2: 27 mmol/L (ref 22–32)
Calcium: 9.6 mg/dL (ref 8.9–10.3)
Chloride: 103 mmol/L (ref 98–111)
Creatinine, Ser: 1.12 mg/dL (ref 0.61–1.24)
GFR, Estimated: >60 mL/min (ref >60)
Glucose, Bld: 119 mg/dL — ABNORMAL HIGH (ref 70–99)
Potassium: 4.7 mmol/L (ref 3.5–5.1)
Sodium: 139 mmol/L (ref 135–145)

## 2023-12-22 LAB — BRAIN NATRIURETIC PEPTIDE: B Natriuretic Peptide: 98.5 pg/mL (ref 0.0–100.0)

## 2023-12-22 MED ORDER — ENTRESTO 24-26 MG PO TABS: 1.0000 | ORAL_TABLET | Freq: Two times a day (BID) | ORAL | 11 refills | Status: DC

## 2023-12-22 NOTE — Progress Notes (Signed)
 ReDS Vest / Clip - 12/22/23 1000       ReDS Vest / Clip   Station Marker C    Ruler Value 30   30   ReDS Value Range Moderate volume overload    ReDS Actual Value 36

## 2023-12-22 NOTE — Addendum Note (Signed)
 Encounter addended by: Arleene Belt, PA-C on: 12/22/2023 4:37 PM  Actions taken: Clinical Note Signed

## 2023-12-22 NOTE — Patient Instructions (Signed)
 Stop Losartan . Start Entresto  24/26 mg twice daily. Free 30 day coupon provided. Labs today - will call you if abnormal. Follow up with General Cardiology. You may call us  at [phone] if any questions or concerns.

## 2023-12-24 NOTE — Progress Notes (Unsigned)
 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 male with a PMH of AD s/p anterior  STEMI with PCI/DES to 1% occluded mid LAD x 2 with overlapping stents HFrEF/ICM, HLD, HTN, CVA, fibromyalgia, rectal CA presents today for post PCI follow-up.  Derrick Johnson presented to the ED via EMS on 11/27/2023 with complaint of sudden onset of chest pain and shortness of breath.  He was found to have anterior STEMI and transported to the Cath Lab and underwent LHC that showed 100% mid LAD occlusion treated with DES x 2 with jailed diagonal and kissing balloon to preserve flow and jailed diagonal.  He developed hypotension post LHC requiring pressors but weaned off.  2D echo completed showing acute HFrEF with EF of 40-45% and RWMA with severe anterior lateral hypokinesis.  He was continued on DAPT with ASA and Plavix  for 1 year and GDMT for cardiomyopathy.  He was discharged on 11/29/2023 and seen in the ED and seen for [facility] visit on 4//25 in the advanced heart failure clinic.  Visit patient reported feeling better but constant fatigue and dizziness and red clip showing 39% suggesting fluid.  He was started on Lasix  20 mg daily and continued on Toprol , losartan  with plan to transition to Entresto .  He was also started on spironolactone  12.5 mg with plan to avoid SGLT2 with history of frequent UTIs.  He was last seen on 12/22/2023 in HF clinic and was down 7 pounds and occasional episodes of chest pain but denies any  reminiscent of prior angina.  He was addition to Entresto  24/26 mg twice daily with plan to repeat 2D echo in 3 months.  Derrick Johnson presents today with his wife for post PCI follow-up.  He reports since his procedure occasional episodes of chest pain, which he describes as an 'awareness' rather than pain, differing from the pain experienced during the heart attack. He also experiences fatigue and shortness of breath, which limits his physical activity. No swelling in ankles is noted. His current medications include Lasix , Entresto , spironolactone , and metoprolol . Due to a history of urinary tract infections, he is not on Jardiance. He also takes [place] as needed and testosterone . He has a history of chronic pain and has undergone multiple surgeries, including four rotator cuff surgeries and spinal cord stimulator placement. His weight has decreased from 222 pounds to 216 pounds, and he is trying to maintain this through diet, avoiding salt, fried foods, and eating mostly home-cooked meals. He experiences constipation regularly. He lives in [place] and has limited driving ability, which affects his ability to attend cardiac rehab. Patient denies chest pain, palpitations, dyspnea, PND, orthopnea, nausea, vomiting, dizziness, syncope, edema, weight gain, or early satiety.  Discussed the use of AI scribe software for clinical note transcription with the patient, who gave verbal consent to proceed.  History of Present Illness   Review of Systems  Please  see the history of present illness.    All other systems reviewed and are otherwise negative except as noted above.  Physical Exam    Wt Readings from Last 3 Encounters:  12/25/23 222 lb 6.4 oz (100.9 kg)  12/22/23 221 lb 3.2 oz (100.3 kg)  12/08/23 228 lb 3.2 oz (103.5 kg)   VS: Vitals:   12/25/23 1411  BP: 98/60  Pulse: 86  SpO2: 96%  ,Body mass index is 33.82 kg/m. GEN: Well nourished, well developed in no acute distress Neck: No JVD; No  carotid bruits Pulmonary: Clear to auscultation without rales, wheezing or rhonchi  Cardiovascular: Normal rate. Regular rhythm. Normal S1. Normal S2.   Murmurs: There is no murmur.  ABDOMEN: Soft, non-tender, non-distended EXTREMITIES:  No edema; No deformity   EKG/LABS/ Recent Cardiac Studies   ECG personally reviewed by me today -none completed today  Risk Assessment/Calculations:          Lab Results  Component Value Date   WBC 9.6 11/29/2023   HGB 14.5 11/29/2023   HCT 42.3 11/29/2023   MCV 82.5 11/29/2023   PLT 210 11/29/2023   Lab Results  Component Value Date   CREATININE 1.12 12/22/2023   BUN 10 12/22/2023   NA 139 12/22/2023   K 4.7 12/22/2023   CL 103 12/22/2023   CO2 27 12/22/2023   Lab Results  Component Value Date   CHOL 190 11/28/2023   HDL 41 11/28/2023   LDLCALC 103 (H) 11/28/2023   TRIG 232 (H) 11/28/2023   CHOLHDL 4.6 11/28/2023    Lab Results  Component Value Date   HGBA1C 5.4 04/03/2019   Assessment & Plan    1.  Coronary artery disease: -anterior  STEMI with PCI/DES to 1% occluded mid LAD x 2 with overlapping stents  - Today patient reports occasional chest pain noted, but no recurrence of initial heart attack pain.  -Cardiac rehab encouraged. - Continue aspirin  and Plavix  for one year, then lifelong aspirin . - Continue current GDMT with ASA 81 mg, Plavix  75 mg, Lipitor 80 mg, Toprol -XL 25 mg  2.  HFrEF/ICM: -EF of 40-45% and RWMA with severe anterior lateral hypokinesis currently followed by AHF clinic - Patient is euvolemic on examination today - Continue Entresto , spironolactone , and metoprolol . - Order echocardiogram in three months. - Monitor blood pressure at home and report if it rises to the 130s. - Consider increasing spironolactone  if blood pressure allows. - Follow up in three months after echocardiogram.  3.  Essential hypertension: - Patient's blood pressure today was stable at 98/60 - GDMT not titrated due to low  BP. - Continue Toprol -XL 25 mg, Entresto  24/26 mg, spironolactone  12.5 mg daily  4.  Hyperlipidemia: - Patient's LDL cholesterol was 103 - Continue Lipitor 80 mg daily  5.  History of CVA: - No residual effects noted - Continue ASA 81 mg and Plavix  75 mg - Continue good BP control    Cardiac Rehabilitation Eligibility Assessment  The patient is ready to start cardiac rehabilitation from a cardiac standpoint.     Disposition: Follow-up with Arun K Thukkani, MD or APP in 3 months   Signed, Francene Ing, Retha Cast, NP 12/25/2023, 5:17 PM [facility]

## 2023-12-25 ENCOUNTER — Telehealth: Payer: Self-pay

## 2023-12-25 ENCOUNTER — Encounter: Payer: Self-pay | Admitting: Nurse Practitioner

## 2023-12-25 ENCOUNTER — Ambulatory Visit: Attending: Nurse Practitioner | Admitting: Nurse Practitioner

## 2023-12-25 ENCOUNTER — Other Ambulatory Visit (HOSPITAL_COMMUNITY): Payer: Self-pay

## 2023-12-25 VITALS — BP 98/60 | HR 86 | Ht 68.0 in | Wt 222.4 lb

## 2023-12-25 DIAGNOSIS — I2510 Atherosclerotic heart disease of native coronary artery without angina pectoris: Secondary | ICD-10-CM | POA: Insufficient documentation

## 2023-12-25 DIAGNOSIS — I639 Cerebral infarction, unspecified: Secondary | ICD-10-CM | POA: Insufficient documentation

## 2023-12-25 DIAGNOSIS — E782 Mixed hyperlipidemia: Secondary | ICD-10-CM | POA: Diagnosis not present

## 2023-12-25 DIAGNOSIS — I5022 Chronic systolic (congestive) heart failure: Secondary | ICD-10-CM | POA: Insufficient documentation

## 2023-12-25 DIAGNOSIS — I255 Ischemic cardiomyopathy: Secondary | ICD-10-CM | POA: Diagnosis not present

## 2023-12-25 DIAGNOSIS — I10 Essential (primary) hypertension: Secondary | ICD-10-CM | POA: Diagnosis present

## 2023-12-25 MED ORDER — NITROGLYCERIN 0.4 MG SL SUBL: 0.4000 mg | SUBLINGUAL_TABLET | SUBLINGUAL | 3 refills | Status: AC | PRN

## 2023-12-25 MED ORDER — ATORVASTATIN CALCIUM 80 MG PO TABS: 80.0000 mg | ORAL_TABLET | Freq: Every day | ORAL | 3 refills | Status: DC

## 2023-12-25 MED ORDER — ASPIRIN 81 MG PO CHEW: 81.0000 mg | CHEWABLE_TABLET | Freq: Every day | ORAL | 3 refills | Status: AC

## 2023-12-25 NOTE — Telephone Encounter (Signed)
 Patient Advocate Encounter   The patient was approved for a [employer] grant that will help cover the cost of ENTRESTO  Total amount awarded, $10,000.  Effective: 11/25/23 - 11/23/24   [id number] [id number]   Pharmacy provided with approval and processing information. Patient informed via Tilman Fonder, CPhT  Pharmacy Patient Advocate Specialist  Direct Number: [phone] Fax: [phone]

## 2023-12-25 NOTE — Patient Instructions (Signed)
 Medication Instructions:  Your physician recommends that you continue on your current medications as directed. Please refer to the Current Medication list given to you today. *If you need a refill on your cardiac medications before your next appointment, please call your pharmacy*  Lab Work: NONE ORDERED If you have labs (blood work) drawn today and your tests are completely normal, you will receive your results only by: MyChart Message (if you have MyChart) OR A paper copy in the mail If you have any lab test that is abnormal or we need to change your treatment, we will call you to review the results.  Testing/Procedures: Your physician has requested that you have an echocardiogram. Echocardiography is a painless test that uses sound waves to create images of your heart. It provides your doctor with information about the size and shape of your heart and how well your heart's chambers and valves are working. This procedure takes approximately one hour. There are no restrictions for this procedure. Please do NOT wear cologne, perfume, aftershave, or lotions (deodorant is allowed). Please arrive 15 minutes prior to your appointment time.  Please note: We ask at that you not bring children with you during ultrasound (echo/ vascular) testing. Due to room size and safety concerns, children are not allowed in the ultrasound rooms during exams. Our front office staff cannot provide observation of children in our lobby area while testing is being conducted. An adult accompanying a patient to their appointment will only be allowed in the ultrasound room at the discretion of the ultrasound technician under special circumstances. We apologize for any inconvenience. SCHEDULE IN 3 MONTHS   Follow-Up: At [facility], you and your health needs are our priority.  As part of our continuing mission to provide you with exceptional heart care, our providers are all part of one team.  This team includes  your primary Cardiologist (physician) and Advanced Practice Providers or APPs (Physician Assistants and Nurse Practitioners) who all work together to provide you with the care you need, when you need it.  Your next appointment:   3 month(s)  Provider:   Charles Connor, NP        We recommend signing up for the patient portal called "MyChart".  Sign up information is provided on this After Visit Summary.  MyChart is used to connect with patients for Virtual Visits (Telemedicine).  Patients are able to view lab/test results, encounter notes, upcoming appointments, etc.  Non-urgent messages can be sent to your provider as well.   To learn more about what you can do with MyChart, go to [email or web address].   Other Instructions CHECK YOUR BLOOD PRESSURE FOR ONE WEEK THEN CONTACT THE OFFICE WITH YOUR READINGS       1st Floor: - Lobby - Registration  - Pharmacy  - Lab - Cafe  2nd Floor: - PV Lab - Diagnostic Testing (echo, CT, nuclear med)  3rd Floor: - Vacant  4th Floor: - TCTS (cardiothoracic surgery) - AFib Clinic - Structural Heart Clinic - Vascular Surgery  - Vascular Ultrasound  5th Floor: - HeartCare Cardiology (general and EP) - Clinical Pharmacy for coumadin, hypertension, lipid, weight-loss medications, and med management appointments    Valet parking services will be available as well.

## 2023-12-26 ENCOUNTER — Telehealth (HOSPITAL_COMMUNITY): Payer: Self-pay

## 2023-12-26 ENCOUNTER — Other Ambulatory Visit (HOSPITAL_COMMUNITY): Payer: Self-pay

## 2023-12-26 ENCOUNTER — Encounter (HOSPITAL_COMMUNITY): Payer: Self-pay

## 2023-12-26 NOTE — Telephone Encounter (Signed)
Attempted to call patient in regards to Cardiac Rehab - Unable to leave VM, VM box full.  Mailed letter

## 2023-12-28 ENCOUNTER — Other Ambulatory Visit (HOSPITAL_COMMUNITY): Payer: Self-pay

## 2024-01-01 ENCOUNTER — Other Ambulatory Visit

## 2024-01-01 ENCOUNTER — Ambulatory Visit: Admitting: Oncology

## 2024-01-04 ENCOUNTER — Other Ambulatory Visit (HOSPITAL_COMMUNITY): Payer: Self-pay

## 2024-01-19 ENCOUNTER — Telehealth (HOSPITAL_COMMUNITY): Payer: Self-pay

## 2024-01-19 NOTE — Telephone Encounter (Signed)
 Spoke with patient regarding cardiac rehab, he is not interested in classes as it would be a lot of driving for him. He is not interested in considering a different location for CR program.  Closing referral.

## 2024-01-30 ENCOUNTER — Other Ambulatory Visit: Payer: Self-pay | Admitting: *Deleted

## 2024-01-30 ENCOUNTER — Encounter: Payer: Self-pay | Admitting: *Deleted

## 2024-01-30 ENCOUNTER — Encounter: Payer: Self-pay | Admitting: Oncology

## 2024-01-30 ENCOUNTER — Inpatient Hospital Stay

## 2024-01-30 ENCOUNTER — Inpatient Hospital Stay: Attending: Oncology | Admitting: Oncology

## 2024-01-30 VITALS — BP 115/73 | HR 66 | Temp 97.6°F | Resp 18 | Ht 68.0 in | Wt 220.3 lb

## 2024-01-30 DIAGNOSIS — G43909 Migraine, unspecified, not intractable, without status migrainosus: Secondary | ICD-10-CM | POA: Insufficient documentation

## 2024-01-30 DIAGNOSIS — I2510 Atherosclerotic heart disease of native coronary artery without angina pectoris: Secondary | ICD-10-CM | POA: Insufficient documentation

## 2024-01-30 DIAGNOSIS — C20 Malignant neoplasm of rectum: Secondary | ICD-10-CM

## 2024-01-30 DIAGNOSIS — Z85048 Personal history of other malignant neoplasm of rectum, rectosigmoid junction, and anus: Secondary | ICD-10-CM | POA: Diagnosis present

## 2024-01-30 DIAGNOSIS — I2102 ST elevation (STEMI) myocardial infarction involving left anterior descending coronary artery: Secondary | ICD-10-CM

## 2024-01-30 DIAGNOSIS — I252 Old myocardial infarction: Secondary | ICD-10-CM | POA: Diagnosis not present

## 2024-01-30 DIAGNOSIS — G8929 Other chronic pain: Secondary | ICD-10-CM | POA: Insufficient documentation

## 2024-01-30 DIAGNOSIS — K5900 Constipation, unspecified: Secondary | ICD-10-CM | POA: Diagnosis not present

## 2024-01-30 DIAGNOSIS — M25512 Pain in left shoulder: Secondary | ICD-10-CM | POA: Diagnosis not present

## 2024-01-30 DIAGNOSIS — M797 Fibromyalgia: Secondary | ICD-10-CM | POA: Diagnosis not present

## 2024-01-30 LAB — CBC WITH DIFFERENTIAL (CANCER CENTER ONLY)
Abs Immature Granulocytes: 0.07 10*3/uL (ref 0.00–0.07)
Basophils Absolute: 0.1 10*3/uL (ref 0.0–0.1)
Basophils Relative: 1 %
Eosinophils Absolute: 0.4 10*3/uL (ref 0.0–0.5)
Eosinophils Relative: 5 %
HCT: 51.4 % (ref 39.0–52.0)
Hemoglobin: 17.2 g/dL — ABNORMAL HIGH (ref 13.0–17.0)
Immature Granulocytes: 1 %
Lymphocytes Relative: 16 %
Lymphs Abs: 1.2 10*3/uL (ref 0.7–4.0)
MCH: 29.1 pg (ref 26.0–34.0)
MCHC: 33.5 g/dL (ref 30.0–36.0)
MCV: 86.8 fL (ref 80.0–100.0)
Monocytes Absolute: 1 10*3/uL (ref 0.1–1.0)
Monocytes Relative: 13 %
Neutro Abs: 5.1 10*3/uL (ref 1.7–7.7)
Neutrophils Relative %: 64 %
Platelet Count: 250 10*3/uL (ref 150–400)
RBC: 5.92 MIL/uL — ABNORMAL HIGH (ref 4.22–5.81)
RDW: 14.9 % (ref 11.5–15.5)
WBC Count: 7.9 10*3/uL (ref 4.0–10.5)
nRBC: 0 % (ref 0.0–0.2)

## 2024-01-30 LAB — CMP (CANCER CENTER ONLY)
ALT: 29 U/L (ref 0–44)
AST: 31 U/L (ref 15–41)
Albumin: 4.6 g/dL (ref 3.5–5.0)
Alkaline Phosphatase: 95 U/L (ref 38–126)
Anion gap: 12 (ref 5–15)
BUN: 6 mg/dL — ABNORMAL LOW (ref 8–23)
CO2: 26 mmol/L (ref 22–32)
Calcium: 10.1 mg/dL (ref 8.9–10.3)
Chloride: 100 mmol/L (ref 98–111)
Creatinine: 1.15 mg/dL (ref 0.61–1.24)
GFR, Estimated: >60 mL/min (ref >60)
Glucose, Bld: 115 mg/dL — ABNORMAL HIGH (ref 70–99)
Potassium: 4.6 mmol/L (ref 3.5–5.1)
Sodium: 138 mmol/L (ref 135–145)
Total Bilirubin: 0.4 mg/dL (ref 0.0–1.2)
Total Protein: 7.9 g/dL (ref 6.5–8.1)

## 2024-01-30 LAB — CEA (ACCESS): CEA (CHCC): 1.68 ng/mL (ref 0.00–5.00)

## 2024-01-30 NOTE — Assessment & Plan Note (Signed)
 STEMI in March 2025, treated with two stents. Currently on dual antiplatelet therapy with clopidogrel  and aspirin . No current angina. Cardiologist follow-up was satisfactory. Clopidogrel  to continue for at least one year, aspirin  indefinitely. - Continue clopidogrel  and aspirin  as prescribed - Coordinate with cardiologist regarding potential temporary discontinuation of clopidogrel  for future gastrointestinal procedures

## 2024-01-30 NOTE — Progress Notes (Signed)
 [facility]  ONCOLOGY CONSULT NOTE   PATIENT NAME: Derrick Johnson   MR#: [id number] DOB: [birth year]  DATE OF SERVICE: 01/30/2024   REFERRING PROVIDER  Derrick Chamorro, MD   Patient Care Team: Derrick Chamorro, MD as PCP - General (Family Medicine) Derrick Phy, MD as PCP - Cardiology (Cardiology) Derrick Caddy, MD as Consulting Physician (Physical Medicine and Rehabilitation) Derrick Munroe, NP as Nurse Practitioner (Physical Medicine and Rehabilitation) Derrick Breslow, RN (Inactive) as Oncology Nurse Navigator Derrick Coil, MD as Consulting Physician (Gastroenterology)    CHIEF COMPLAINT/ PURPOSE OF CONSULTATION:   Reestablish care for history of rectal cancer, diagnosed in February 2021.  ASSESSMENT & PLAN:   Derrick Johnson is a 68 y.o. gentleman with a past medical history of rectal cancer diagnosed in February 2021, treated with concurrent chemoradiation using Xeloda , completed treatment in April 2021, patient did not follow-up with surgery or with oncology departments after this.  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  He was referred back to our clinic by his PCP to reestablish care for his history of rectal cancer.  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  2 small perirectal nodes measuring 3 to 4 mm  Lower EUS 11/13/2019-tumor in the proximal rectum.  Proximal posterolateral rectal mass.  2 abnormal lymph nodes in the perirectal region.  Staged T3N1 by endorectal ultrasound.    Previously he was seen by  Dr. Scherrie Johnson in our clinic.  He received treatments with Xeloda /radiation 11/25/2019-01/01/2020  He was supposed to undergo surgical resection under the direction of Dr. Andy Johnson in June 2021.  Patient apparently had dental infections around that time and needed dental extractions and patient deferred surgery and did not seek follow-up care either with the surgeons or with our department.  He also did not have follow-up appointments with gastroenterology.  His PCP referred him back to his and he reestablished care with us  on 01/30/2024.  Plan for restaging CT scan of the chest, abdomen and pelvis and referral sent to GI for endoscopic evaluation.  Patient cannot get MRIs because of nerve stimulator in place.  Acute ST elevation myocardial infarction (STEMI) due to occlusion of mid portion of left anterior descending (LAD) coronary artery [facility]) STEMI in March 2025, treated with two stents. Currently on dual antiplatelet therapy with clopidogrel  and aspirin . No current angina. Cardiologist follow-up was satisfactory. Clopidogrel  to continue for at least one year, aspirin  indefinitely. - Continue clopidogrel  and aspirin  as prescribed - Coordinate with cardiologist regarding potential temporary discontinuation of clopidogrel  for future gastrointestinal procedures  Chronic left shoulder pain Chronic pain in lower back, neck, hips, and knees. Managed primarily by primary care provider. History of multiple rotator cuff surgeries.  I will plan to see him approximately in 6 weeks with CT results and after GI follow-up evaluation.  I reviewed lab results and outside records for this visit and discussed relevant results with the patient. Diagnosis, plan of care and treatment options were also discussed in detail with the patient. Opportunity provided to ask questions and answers provided to his apparent satisfaction. Provided instructions to call our clinic with any problems, questions or concerns prior  to return  visit. I recommended to continue follow-up with PCP and sub-specialists. He verbalized understanding and agreed with the plan. No barriers to learning was detected.  NCCN guidelines have been consulted in the planning of this patient's care.  Derrick Berber, MD  01/30/2024 4:30 PM  [facility] - A DEPT OF [facility]  [facility] [id number] [place] [address] Dept: [phone] Dept Fax: [phone]   HISTORY OF PRESENTING ILLNESS:   I have reviewed his chart and materials related to his cancer extensively and collaborated history with the patient. Summary of oncologic history is as follows:  ONCOLOGY HISTORY:  Colonoscopy 10/29/2019-rectal mass at approximately 10-11 cm above the anus, biopsied and tattooed, pathology confirmed invasive well-differentiated adenocarcinoma, normal mismatch repair protein expression  CTs 11/12/2019-mild mid rectal wall thickening.  2 small perirectal nodes measuring 3 to 4 mm  Lower EUS 11/13/2019-tumor in the proximal rectum.  Proximal posterolateral rectal mass.  2 abnormal lymph nodes in the perirectal region.  Staged T3N1 by endorectal ultrasound.    Xeloda /radiation 11/25/2019-01/01/2020  He was supposed to undergo surgical resection under the direction of Dr. Andy Johnson in June 2021.  Patient apparently had dental infections around that time and needed dental extractions and patient deferred surgery and did not seek follow-up care either with the surgeons or with our department.  He also did not have follow-up appointments with gastroenterology.  His PCP referred him back to his and he reestablished care with us  on 01/30/2024.  Plan for restaging CT scan of the chest, abdomen and pelvis and referral sent to GI for endoscopic evaluation.  Patient cannot get MRIs because of nerve stimulator in place.   INTERVAL HISTORY:  Discussed the use of AI scribe software for clinical note transcription  with the patient, who gave verbal consent to proceed.  History of Present Illness Derrick Johnson "Derrick Johnson" is a 68 year old male with coronary artery disease and cancer who presents for follow-up after a heart attack and to discuss ongoing cancer management. He is accompanied by his wife, Stephenie Einstein.  Two months ago, he experienced a major heart attack (STEMI) and had two stents placed. He was treated emergently and has since been on Plavix  and aspirin . He no longer experiences chest pain, describing any discomfort as 'nothing like heart pain'. He saw a cardiologist last month for follow-up.  He completed chemoradiation for cancer nearly four years ago but did not proceed with surgery due to severe dental infections. He had all his teeth extracted and dentures placed. He has not had any follow-up scopes or seen a gastroenterologist since then. He reports occasional hematochezia, constipation, and pain during bowel movements. He takes Miralax once daily for constipation.  He has a spinal cord stimulator for pain management, which is no longer functional. He experiences widespread pain, particularly sharp pain in his neck, lower back, and hips. He has undergone multiple rotator cuff surgeries. He reports a decreased appetite since the heart attack, often going all day without eating, except for a preference for ice cream. No significant nausea or vomiting but notes occasional queasiness with migraines.  He has a history of methadone  use for over twenty years, which contributed to dental issues due to dry mouth. He is no longer on methadone  but has a spinal cord stimulator for pain, which is not currently operational.    MEDICAL HISTORY:  Past Medical History:  Diagnosis Date   Asthma    Back contusion  Chronic migraine    Chronic pain    Dissection of cerebral artery (HCC)    Electrocution and nonfatal effects of electric current    Fibromyalgia    Rectal cancer (HCC)    Stroke [facility])      SURGICAL HISTORY: Past Surgical History:  Procedure Laterality Date   CORONARY/GRAFT ACUTE MI REVASCULARIZATION N/A 11/27/2023   Procedure: Coronary/Graft Acute MI Revascularization;  Surgeon: Derrick Phy, MD;  Location: [facility] INVASIVE CV LAB;  Service: Cardiovascular;  Laterality: N/A;   CRYO INTERCOSTAL NERVE BLOCK     LEFT HEART CATH AND CORONARY ANGIOGRAPHY N/A 11/27/2023   Procedure: LEFT HEART CATH AND CORONARY ANGIOGRAPHY;  Surgeon: Derrick Phy, MD;  Location: [facility] INVASIVE CV LAB;  Service: Cardiovascular;  Laterality: N/A;   maxofacial surgery     ROTATOR CUFF REPAIR Left    SPINAL CORD STIMULATOR IMPLANT     SPINE SURGERY     TMJ ARTHROSCOPY      SOCIAL HISTORY: Social History   Socioeconomic History   Marital status: Married    Spouse name: [name]   Number of children: 0   Years of education: Not on file   Highest education level: High school graduate  Occupational History   Occupation: Disability  Tobacco Use   Smoking status: Never   Smokeless tobacco: Never  Vaping Use   Vaping status: Never Used  Substance and Sexual Activity   Alcohol use: Not Currently   Drug use: No   Sexual activity: Not on file  Other Topics Concern   Not on file  Social History Narrative   Not on file   Social Drivers of Health   Financial Resource Strain: Low Risk  (11/29/2023)   Overall Financial Resource Strain (CARDIA)    Difficulty of Paying Living Expenses: Not very hard  Food Insecurity: No Food Insecurity (01/30/2024)   Hunger Vital Sign    Worried About Running Out of Food in the Last Year: Never true    Ran Out of Food in the Last Year: Never true  Transportation Needs: No Transportation Needs (01/30/2024)   PRAPARE - Administrator, Civil Service (Medical): No    Lack of Transportation (Non-Medical): No  Physical Activity: Not on file  Stress: Not on file  Social Connections: Unknown (11/28/2023)   Social Connection and Isolation Panel [NHANES]     Frequency of Communication with Friends and Family: Three times a week    Frequency of Social Gatherings with Friends and Family: Three times a week    Attends Religious Services: Patient declined    Active Member of Clubs or Organizations: Patient declined    Attends Banker Meetings: Patient declined    Marital Status: Married  Catering manager Violence: Not At Risk (01/30/2024)   Humiliation, Afraid, Rape, and Kick questionnaire    Fear of Current or Ex-Partner: No    Emotionally Abused: No    Physically Abused: No    Sexually Abused: No    FAMILY HISTORY: Family History  Problem Relation Age of Onset   Hyperlipidemia Mother    Heart disease Father    Thyroid  disease Neg Hx     ALLERGIES:  He is allergic to latex.  MEDICATIONS:  Current Outpatient Medications  Medication Sig Dispense Refill   albuterol  (PROVENTIL ) (2.5 MG/3ML) 0.083% nebulizer solution Take 2.5 mg by nebulization every 6 (six) hours as needed for wheezing or shortness of breath.      albuterol  (VENTOLIN  HFA) 108 (90  Base) MCG/ACT inhaler Inhale 1-2 puffs into the lungs every 6 (six) hours as needed for wheezing or shortness of breath. 1 Inhaler 5   aspirin  81 MG chewable tablet Chew 1 tablet (81 mg total) by mouth daily. 90 tablet 3   atorvastatin  (LIPITOR) 80 MG tablet Take 1 tablet (80 mg total) by mouth daily. 90 tablet 3   clopidogrel  (PLAVIX ) 75 MG tablet Take 1 tablet (75 mg total) by mouth daily. 90 tablet 2   cyclobenzaprine (FLEXERIL) 10 MG tablet Take 10 mg by mouth every 8 (eight) hours as needed for muscle spasms.     furosemide  (LASIX ) 20 MG tablet Take 1 tablet (20 mg total) by mouth daily. 90 tablet 3   methadone  (DOLOPHINE ) 10 MG tablet Take 1-2 tablets (10-20 mg total) by mouth every 12 (twelve) hours. (Patient taking differently: Take 10 mg by mouth in the morning, at noon, in the evening, and at bedtime.) 66 tablet 0   methylphenidate (RITALIN) 20 MG tablet Take 20 mg by mouth 3  (three) times daily.     metoprolol  succinate (TOPROL -XL) 25 MG 24 hr tablet Take 1 tablet (25 mg total) by mouth at bedtime.     Multiple Vitamin (MULTIVITAMIN) capsule Take 1 capsule by mouth daily.     nitroGLYCERIN  (NITROSTAT ) 0.4 MG SL tablet Place 1 tablet (0.4 mg total) under the tongue every 5 (five) minutes x 3 doses as needed for chest pain. 25 tablet 3   polyethylene glycol (MIRALAX) 17 g packet Take 17 g by mouth daily.     sacubitril-valsartan (ENTRESTO ) 24-26 MG Take 1 tablet by mouth 2 (two) times daily. 60 tablet 11   spironolactone  (ALDACTONE ) 25 MG tablet Take 0.5 tablets (12.5 mg total) by mouth daily. 45 tablet 3   SUMAtriptan  (IMITREX ) 100 MG tablet Take 100 mg by mouth every 2 (two) hours as needed for migraine.     tamsulosin (FLOMAX) 0.4 MG CAPS capsule Take 0.4-0.8 mg by mouth See admin instructions. Take 0.4mg  (1 capsule) by mouth daily for 2 weeks, then increase to 0.8mg  (2 capsules) daily,     traZODone  (DESYREL ) 150 MG tablet TAKE 1 TABLET BY MOUTH DAILY (Patient taking differently: Take 75 mg by mouth at bedtime as needed for sleep.) 30 tablet 4   VITAMIN D -VITAMIN K PO Take 1 capsule by mouth daily. 10,000IU/ 100 mcg     tamsulosin (FLOMAX) 0.4 MG CAPS capsule Take 0.4 mg by mouth daily. Pt takes 2 tablets 0.8 mg once a day.     No current facility-administered medications for this visit.   Facility-Administered Medications Ordered in Other Visits  Medication Dose Route Frequency Provider Last Rate Last Admin   clindamycin  (CLEOCIN ) 900 mg in dextrose  5 % 50 mL IVPB  900 mg Intravenous 60 min Pre-Op Joyce Nixon, MD       And   gentamicin  (GARAMYCIN ) 5 mg/kg in dextrose  5 % 50 mL IVPB  5 mg/kg Intravenous 60 min Pre-Op Joyce Nixon, MD        REVIEW OF SYSTEMS:    Review of Systems - Oncology  All other pertinent systems were reviewed with the patient and are negative.  PHYSICAL EXAMINATION:   Onc Performance Status - 01/30/24 1311       ECOG Perf  Status   ECOG Perf Status Restricted in physically strenuous activity but ambulatory and able to carry out work of a light or sedentary nature, e.g., light house work, office work  KPS SCALE   KPS % SCORE Normal activity with effort, some s/s of disease             Vitals:   01/30/24 1259  BP: 115/73  Pulse: 66  Resp: 18  Temp: 97.6 F (36.4 C)  SpO2: 100%   Filed Weights   01/30/24 1259  Weight: 220 lb 4.8 oz (99.9 kg)    Physical Exam Constitutional:      General: He is not in acute distress.    Appearance: Normal appearance.  HENT:     Head: Normocephalic and atraumatic.  Eyes:     Conjunctiva/sclera: Conjunctivae normal.  Cardiovascular:     Rate and Rhythm: Normal rate and regular rhythm.     Heart sounds: Normal heart sounds.  Pulmonary:     Effort: Pulmonary effort is normal. No respiratory distress.     Breath sounds: Normal breath sounds.  Abdominal:     General: There is no distension.     Palpations: Abdomen is soft. There is no mass.  Neurological:     General: No focal deficit present.     Mental Status: He is alert and oriented to person, place, and time.  Psychiatric:        Mood and Affect: Mood normal.        Behavior: Behavior normal.      LABORATORY DATA:   I have reviewed the data as listed.  Results for orders placed or performed in visit on 01/30/24  CEA (Access)  Result Value Ref Range   CEA ([facility]) 1.68 0.00 - 5.00 ng/mL  CMP (Cancer Center only)  Result Value Ref Range   Sodium 138 135 - 145 mmol/L   Potassium 4.6 3.5 - 5.1 mmol/L   Chloride 100 98 - 111 mmol/L   CO2 26 22 - 32 mmol/L   Glucose, Bld 115 (H) 70 - 99 mg/dL   BUN 6 (L) 8 - 23 mg/dL   Creatinine 2.95 2.84 - 1.24 mg/dL   Calcium  10.1 8.9 - 10.3 mg/dL   Total Protein 7.9 6.5 - 8.1 g/dL   Albumin 4.6 3.5 - 5.0 g/dL   AST 31 15 - 41 U/L   ALT 29 0 - 44 U/L   Alkaline Phosphatase 95 38 - 126 U/L   Total Bilirubin 0.4 0.0 - 1.2 mg/dL   GFR, Estimated >13  >24 mL/min   Anion gap 12 5 - 15  CBC with Differential (Cancer Center Only)  Result Value Ref Range   WBC Count 7.9 4.0 - 10.5 K/uL   RBC 5.92 (H) 4.22 - 5.81 MIL/uL   Hemoglobin 17.2 (H) 13.0 - 17.0 g/dL   HCT 40.1 02.7 - 25.3 %   MCV 86.8 80.0 - 100.0 fL   MCH 29.1 26.0 - 34.0 pg   MCHC 33.5 30.0 - 36.0 g/dL   RDW 66.4 40.3 - 47.4 %   Platelet Count 250 150 - 400 K/uL   nRBC 0.0 0.0 - 0.2 %   Neutrophils Relative % 64 %   Neutro Abs 5.1 1.7 - 7.7 K/uL   Lymphocytes Relative 16 %   Lymphs Abs 1.2 0.7 - 4.0 K/uL   Monocytes Relative 13 %   Monocytes Absolute 1.0 0.1 - 1.0 K/uL   Eosinophils Relative 5 %   Eosinophils Absolute 0.4 0.0 - 0.5 K/uL   Basophils Relative 1 %   Basophils Absolute 0.1 0.0 - 0.1 K/uL   Immature Granulocytes 1 %   Abs Immature Granulocytes  0.07 0.00 - 0.07 K/uL      RADIOGRAPHIC STUDIES:  No recent pertinent imaging available to review.  Orders Placed This Encounter  Procedures   CT CHEST ABDOMEN PELVIS W CONTRAST    Standing Status:   Future    Expected Date:   02/06/2024    Expiration Date:   01/29/2025    If indicated for the ordered procedure, I authorize the administration of contrast media per Radiology protocol:   Yes    Does the patient have a contrast media/X-ray dye allergy?:   No    Preferred imaging location?:   [facility]    If indicated for the ordered procedure, I authorize the administration of oral contrast media per Radiology protocol:   Yes   CBC with Differential (Cancer Center Only)    Standing Status:   Future    Number of Occurrences:   1    Expiration Date:   01/29/2025   CMP (Cancer Center only)    Standing Status:   Future    Number of Occurrences:   1    Expiration Date:   01/29/2025   CEA (Access)    Standing Status:   Future    Number of Occurrences:   1    Expiration Date:   01/29/2025    CODE STATUS:  Code Status History     Date Active Date Inactive Code Status Order ID Comments User Context    11/27/2023 2206 11/29/2023 1747 Full Code [id number]  Philmore Bream, MD Inpatient    Questions for Most Recent Historical Code Status (Order [id number])     Question Answer   By: Consent: discussion documented in EHR            Future Appointments  Date Time Provider Department Center  03/18/2024 10:30 AM [facility] 4 [facility]  03/25/2024 10:30 AM Gerald Kitty., NP [facility] H&V     I spent a total of 60 minutes during this encounter with the patient including review of chart and various tests results, discussions about plan of care and coordination of care plan.  This document was completed utilizing speech recognition software. Grammatical errors, random word insertions, pronoun errors, and incomplete sentences are an occasional consequence of this system due to software limitations, ambient noise, and hardware issues. Any formal questions or concerns about the content, text or information contained within the body of this dictation should be directly addressed to the provider for clarification.

## 2024-01-30 NOTE — Assessment & Plan Note (Signed)
 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  2 small perirectal nodes measuring 3 to 4 mm  Lower EUS 11/13/2019-tumor in the proximal rectum.  Proximal posterolateral rectal mass.  2 abnormal lymph nodes in the perirectal region.  Staged T3N1 by endorectal ultrasound.    Previously he was seen by Dr. Scherrie Curt in our clinic.  He received treatments with Xeloda /radiation 11/25/2019-01/01/2020  He was supposed to undergo surgical resection under the direction of Dr. Andy Bannister in June 2021.  Patient apparently had dental infections around that time and needed dental extractions and patient deferred surgery and did not seek follow-up care either with the surgeons or with our department.  He also did not have follow-up appointments with gastroenterology.  His PCP referred him back to his and he reestablished care with us  on 01/30/2024.  Plan for restaging CT scan of the chest, abdomen and pelvis and referral sent to GI for endoscopic evaluation.  Patient cannot get MRIs because of nerve stimulator in place.

## 2024-01-30 NOTE — Progress Notes (Signed)
 PATIENT NAVIGATOR PROGRESS NOTE  Name: DEMONTREZ RINDFLEISCH Date: 01/30/2024 MRN: [id number]  DOB: 08-23-1956   Reason for visit:  New Patient appt  Comments:  Met with Mr and [name] after appt with Dr Randye Buttner  Urgent referral for [facility] GI to reestablish care  Referral to Nutrition Referral to SW CT with contrast instructions given to patient as he will have CT within next week for staging Given contact number to call with any questions    Time spent counseling/coordinating care: > 60 minutes

## 2024-01-30 NOTE — Assessment & Plan Note (Signed)
 Chronic pain in lower back, neck, hips, and knees. Managed primarily by primary care provider. History of multiple rotator cuff surgeries.

## 2024-01-31 ENCOUNTER — Telehealth: Payer: Self-pay | Admitting: Oncology

## 2024-01-31 ENCOUNTER — Telehealth: Payer: Self-pay

## 2024-01-31 NOTE — Telephone Encounter (Signed)
 [facility] Clinical Social Work  Clinical Social Work was referred by new patient protocol for assessment of psychosocial needs.  Clinical Social Worker contacted patient by phone to offer support and assess for needs. At time of call, no indication of further treatment needed. Patient is re-establishing care. CSW is available should treatment plan change. Referral will be closed.  Maudie Sorrow, LCSW  Clinical Social Worker [facility]

## 2024-01-31 NOTE — Telephone Encounter (Signed)
 Unable to leave voicemail about FU appt.

## 2024-02-01 ENCOUNTER — Encounter: Payer: Self-pay | Admitting: *Deleted

## 2024-02-01 NOTE — Progress Notes (Signed)
 PATIENT NAVIGATOR PROGRESS NOTE  Name: KRISTOPH SATTLER Date: 02/01/2024 MRN: [id number]  DOB: 1956/01/23   Reason for visit: Coordination of care   Comments:  Called and spoke with wife  Pt scheduled for CT scan for staging on 6/5 at [facility]. Reviewed instruction with Mrs Kirtz Call to follow up on referral to [facility] and referral faxed    Time spent counseling/coordinating care: 30-45 minutes

## 2024-02-08 ENCOUNTER — Ambulatory Visit (HOSPITAL_COMMUNITY)
Admission: RE | Admit: 2024-02-08 | Discharge: 2024-02-08 | Disposition: A | Discharge status: Home / Self Care | Source: Ambulatory Visit | Attending: Oncology | Admitting: Oncology

## 2024-02-08 DIAGNOSIS — C20 Malignant neoplasm of rectum: Secondary | ICD-10-CM | POA: Diagnosis present

## 2024-02-08 MED ORDER — IOHEXOL 300 MG/ML  SOLN
100.0000 mL | Freq: Once | INTRAMUSCULAR | Status: AC | PRN
  Administered 2024-02-08: 100 mL via INTRAVENOUS

## 2024-02-12 ENCOUNTER — Ambulatory Visit: Admitting: Nutrition

## 2024-02-12 ENCOUNTER — Ambulatory Visit: Admitting: Oncology

## 2024-02-12 ENCOUNTER — Other Ambulatory Visit: Payer: Self-pay | Admitting: *Deleted

## 2024-02-12 ENCOUNTER — Encounter: Payer: Self-pay | Admitting: *Deleted

## 2024-02-12 ENCOUNTER — Encounter: Payer: Self-pay | Admitting: Oncology

## 2024-02-12 DIAGNOSIS — C20 Malignant neoplasm of rectum: Secondary | ICD-10-CM

## 2024-02-12 NOTE — Progress Notes (Signed)
 Called to follow up on [facility] referral and pt is scheduled with Dr Kimble Pennant on 03/05/24 per pt request

## 2024-02-12 NOTE — Progress Notes (Signed)
 Referral placed URGENT for Dr Baldwin Levee per Dr Randye Buttner

## 2024-02-12 NOTE — Progress Notes (Signed)
 [facility]  HEMATOLOGY-ONCOLOGY ELECTRONIC VISIT PROGRESS NOTE  PATIENT NAME: Derrick Johnson   MR#: [id number] DOB: [birth year]  DATE OF SERVICE: 02/12/2024  Patient Care Team: Candiss Chamorro, MD as PCP - General (Family Medicine) Kyra Phy, MD as PCP - Cardiology (Cardiology) Rawland Caddy, MD as Consulting Physician (Physical Medicine and Rehabilitation) Jodi Munroe, NP as Nurse Practitioner (Physical Medicine and Rehabilitation) Berna Breslow, RN (Inactive) as Oncology Nurse Navigator Celedonio Coil, MD as Consulting Physician (Gastroenterology)  I connected with the patient via telephone conference and verified that I am speaking with the correct person using two identifiers. The patient's location is at home and I am providing care from the [facility].  I discussed the limitations, risks, security and privacy concerns of performing an evaluation and management service by e-visits and the availability of in person appointments. I also discussed with the patient that there may be a patient responsible charge related to this service. The patient expressed understanding and agreed to proceed.   ASSESSMENT & PLAN:   Derrick Johnson is a 68 y.o. gentleman with a past medical history of rectal cancer diagnosed in February 2021, treated with concurrent chemoradiation using Xeloda , completed treatment in April 2021, patient did not follow-up with surgery or with oncology departments after this.  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  He was referred back to our clinic by his PCP to reestablish care for his history of rectal cancer.   Adenocarcinoma of rectum (HCC) Colonoscopy 10/29/2019-rectal mass at approximately 10-11 cm above the anus, biopsied and tattooed,  pathology confirmed invasive well-differentiated adenocarcinoma, normal mismatch repair protein expression  CTs 11/12/2019-mild mid rectal wall thickening.  2 small perirectal nodes measuring 3 to 4 mm  Lower EUS 11/13/2019-tumor in the proximal rectum.  Proximal posterolateral rectal mass.  2 abnormal lymph nodes in the perirectal region.  Staged T3N1 by endorectal ultrasound.    Previously he was seen by Dr. Scherrie Curt in our clinic.  He received treatments with Xeloda /radiation 11/25/2019-01/01/2020  He was supposed to undergo surgical resection under the direction of Dr. Andy Bannister in June 2021.  Patient apparently had dental infections around that time and needed dental extractions and patient deferred surgery and did not seek follow-up care either with the surgeons or with our department.  He also did not have follow-up appointments with gastroenterology.  His PCP referred him back to his and he reestablished care with us  on 01/30/2024.  CEA was normal at 1.68.  CBCD and CMP were unremarkable.  Plan made for restaging CT scan of the chest, abdomen and pelvis and referral sent to GI for endoscopic evaluation.  Patient cannot get MRIs because of nerve stimulator in place.  On 02/08/2024, restaging CT chest, abdomen and pelvis showed multiple lung nodules, largest 1 measuring 1.9 cm, concerning for pulmonary metastatic disease.  Previously seen circumferential superior rectal mass was no longer distinctly visible.  No evidence of lymphadenopathy or metastatic disease in the abdomen or pelvis.  Plan for biopsy of the lung nodules by pulmonology or IR.  Further plan of care will be determined depending on biopsy results.     I discussed the assessment and treatment plan with the patient. The patient was provided an opportunity to ask questions and all were answered. The patient agreed with the plan  and demonstrated an understanding of the instructions. The patient was advised to call back or seek an  in-person evaluation if the symptoms worsen or if the condition fails to improve as anticipated.    I spent 12 minutes over the phone with the patient reviewing test results, discuss management and coordination/planning of care.  Arlo Berber, MD 02/12/2024 4:30 PM [facility] - A DEPT OF [facility] [address] [place] [address] Dept: [phone] Dept Fax: [phone]   INTERVAL HISTORY:  Please see above for problem oriented charting.  The purpose of today's discussion is to explain recent lab results and to formulate plan of care.  Discussed the use of AI scribe software for clinical note transcription with the patient, who gave verbal consent to proceed.  History of Present Illness Derrick Johnson "[name]" is a 68 year old male with rectal cancer who presents with multiple lung nodules.  He underwent a restaging CT chest on February 28, 2024, which revealed multiple lung nodules, the largest measuring 1.9 cm in the right upper lobe. The previously seen circumferential superior rectal mass is no longer distinctly visible, and there is no evidence of lymphadenopathy or metastatic disease in the abdomen or pelvis.  He experiences significant fatigue, which he describes as having 'really just knocked me back,' and notes that he did not experience this level of fatigue during previous treatments. He is unsure if the fatigue is due to heart failure or the lung nodules. He monitors his weight daily and has provided these records to his healthcare team. He also monitors his blood pressure regularly.  He denies any history of smoking. His CEA level is normal at 1.68, and his complete blood count and comprehensive metabolic panel are unremarkable. No anemia, as his hemoglobin levels are normal.   SUMMARY OF ONCOLOGIC HISTORY:   Colonoscopy 10/29/2019-rectal mass at approximately 10-11 cm above the anus, biopsied and  tattooed, pathology confirmed invasive well-differentiated adenocarcinoma, normal mismatch repair protein expression   CTs 11/12/2019-mild mid rectal wall thickening.  2 small perirectal nodes measuring 3 to 4 mm   Lower EUS 11/13/2019-tumor in the proximal rectum.  Proximal posterolateral rectal mass.  2 abnormal lymph nodes in the perirectal region.  Staged T3N1 by endorectal ultrasound.     Xeloda /radiation 11/25/2019-01/01/2020   He was supposed to undergo surgical resection under the direction of Dr. Andy Bannister in June 2021.  Patient apparently had dental infections around that time and needed dental extractions and patient deferred surgery and did not seek follow-up care either with the surgeons or with our department.  He also did not have follow-up appointments with gastroenterology.   His PCP referred him back to his and he reestablished care with us  on 01/30/2024.  CEA was normal at 1.68.  CBCD and CMP were unremarkable.  Plan made for restaging CT scan of the chest, abdomen and pelvis and referral sent to GI for endoscopic evaluation.  Patient cannot get MRIs because of nerve stimulator in place.  On 02/08/2024, restaging CT chest, abdomen and pelvis showed multiple lung nodules, largest 1 measuring 1.9 cm, concerning for pulmonary metastatic disease.  Previously seen circumferential superior rectal mass was no longer distinctly visible.  No evidence of lymphadenopathy or metastatic disease in the abdomen or pelvis.  Plan for biopsy of the lung nodules by pulmonology or IR.  Further plan of care will be determined depending on biopsy results.  REVIEW OF SYSTEMS:  Review of Systems - Oncology  All other pertinent systems were reviewed with the patient and are negative.  I have reviewed the past medical history, past surgical history, social history and family history with the patient and they are unchanged from previous note.  ALLERGIES:  He is allergic to latex.  MEDICATIONS:  Current  Outpatient Medications  Medication Sig Dispense Refill   albuterol  (PROVENTIL ) (2.5 MG/3ML) 0.083% nebulizer solution Take 2.5 mg by nebulization every 6 (six) hours as needed for wheezing or shortness of breath.      albuterol  (VENTOLIN  HFA) 108 (90 Base) MCG/ACT inhaler Inhale 1-2 puffs into the lungs every 6 (six) hours as needed for wheezing or shortness of breath. 1 Inhaler 5   aspirin  81 MG chewable tablet Chew 1 tablet (81 mg total) by mouth daily. 90 tablet 3   atorvastatin  (LIPITOR) 80 MG tablet Take 1 tablet (80 mg total) by mouth daily. 90 tablet 3   clopidogrel  (PLAVIX ) 75 MG tablet Take 1 tablet (75 mg total) by mouth daily. 90 tablet 2   cyclobenzaprine (FLEXERIL) 10 MG tablet Take 10 mg by mouth every 8 (eight) hours as needed for muscle spasms.     furosemide  (LASIX ) 20 MG tablet Take 1 tablet (20 mg total) by mouth daily. 90 tablet 3   methadone  (DOLOPHINE ) 10 MG tablet Take 1-2 tablets (10-20 mg total) by mouth every 12 (twelve) hours. (Patient taking differently: Take 10 mg by mouth in the morning, at noon, in the evening, and at bedtime.) 66 tablet 0   methylphenidate (RITALIN) 20 MG tablet Take 20 mg by mouth 3 (three) times daily.     metoprolol  succinate (TOPROL -XL) 25 MG 24 hr tablet Take 1 tablet (25 mg total) by mouth at bedtime.     Multiple Vitamin (MULTIVITAMIN) capsule Take 1 capsule by mouth daily.     nitroGLYCERIN  (NITROSTAT ) 0.4 MG SL tablet Place 1 tablet (0.4 mg total) under the tongue every 5 (five) minutes x 3 doses as needed for chest pain. 25 tablet 3   polyethylene glycol (MIRALAX) 17 g packet Take 17 g by mouth daily.     sacubitril-valsartan (ENTRESTO ) 24-26 MG Take 1 tablet by mouth 2 (two) times daily. 60 tablet 11   spironolactone  (ALDACTONE ) 25 MG tablet Take 0.5 tablets (12.5 mg total) by mouth daily. 45 tablet 3   SUMAtriptan  (IMITREX ) 100 MG tablet Take 100 mg by mouth every 2 (two) hours as needed for migraine.     tamsulosin (FLOMAX) 0.4 MG CAPS  capsule Take 0.4-0.8 mg by mouth See admin instructions. Take 0.4mg  (1 capsule) by mouth daily for 2 weeks, then increase to 0.8mg  (2 capsules) daily,     tamsulosin (FLOMAX) 0.4 MG CAPS capsule Take 0.4 mg by mouth daily. Pt takes 2 tablets 0.8 mg once a day.     traZODone  (DESYREL ) 150 MG tablet TAKE 1 TABLET BY MOUTH DAILY (Patient taking differently: Take 75 mg by mouth at bedtime as needed for sleep.) 30 tablet 4   VITAMIN D -VITAMIN K PO Take 1 capsule by mouth daily. 10,000IU/ 100 mcg     No current facility-administered medications for this visit.   Facility-Administered Medications Ordered in Other Visits  Medication Dose Route Frequency Provider Last Rate Last Admin   clindamycin  (CLEOCIN ) 900 mg in dextrose  5 % 50 mL IVPB  900 mg Intravenous 60 min Pre-Op Joyce Nixon, MD       And   gentamicin  (GARAMYCIN ) 5 mg/kg in dextrose  5 %  50 mL IVPB  5 mg/kg Intravenous 60 min Pre-Op Joyce Nixon, MD        PHYSICAL EXAMINATION:    Onc Performance Status - 02/12/24 1500       ECOG Perf Status   ECOG Perf Status Restricted in physically strenuous activity but ambulatory and able to carry out work of a light or sedentary nature, e.g., light house work, office work      KPS SCALE   KPS % SCORE Normal activity with effort, some s/s of disease             LABORATORY DATA:   I have reviewed the data as listed.  Recent Results (from the past 2160 hours)  CBC with Differential     Status: Abnormal   Collection Time: 11/27/23  9:13 PM  Result Value Ref Range   WBC 9.2 4.0 - 10.5 K/uL   RBC 5.41 4.22 - 5.81 MIL/uL   Hemoglobin 15.6 13.0 - 17.0 g/dL   HCT 78.4 69.6 - 29.5 %   MCV 82.4 80.0 - 100.0 fL   MCH 28.8 26.0 - 34.0 pg   MCHC 35.0 30.0 - 36.0 g/dL   RDW 28.4 13.2 - 44.0 %   Platelets 233 150 - 400 K/uL   nRBC 0.0 0.0 - 0.2 %   Neutrophils Relative % 65 %   Neutro Abs 6.0 1.7 - 7.7 K/uL   Lymphocytes Relative 18 %   Lymphs Abs 1.6 0.7 - 4.0 K/uL   Monocytes  Relative 9 %   Monocytes Absolute 0.9 0.1 - 1.0 K/uL   Eosinophils Relative 5 %   Eosinophils Absolute 0.5 0.0 - 0.5 K/uL   Basophils Relative 1 %   Basophils Absolute 0.1 0.0 - 0.1 K/uL   Immature Granulocytes 2 %   Abs Immature Granulocytes 0.18 (H) 0.00 - 0.07 K/uL    Comment: Performed at [facility], [address] [place], [place] [address]  Comprehensive metabolic panel     Status: Abnormal   Collection Time: 11/27/23  9:13 PM  Result Value Ref Range   Sodium 140 135 - 145 mmol/L   Potassium 3.7 3.5 - 5.1 mmol/L   Chloride 106 98 - 111 mmol/L   CO2 26 22 - 32 mmol/L   Glucose, Bld 113 (H) 70 - 99 mg/dL    Comment: Glucose reference range applies only to samples taken after fasting for at least 8 hours.   BUN <5 (L) 8 - 23 mg/dL   Creatinine, Ser 5.36 0.61 - 1.24 mg/dL   Calcium  8.9 8.9 - 10.3 mg/dL   Total Protein 6.7 6.5 - 8.1 g/dL   Albumin 3.6 3.5 - 5.0 g/dL   AST 47 (H) 15 - 41 U/L   ALT 60 (H) 0 - 44 U/L   Alkaline Phosphatase 69 38 - 126 U/L   Total Bilirubin 0.5 0.0 - 1.2 mg/dL   GFR, Estimated >64 >40 mL/min    Comment: (NOTE) Calculated using the CKD-EPI Creatinine Equation (2021)    Anion gap 8 5 - 15    Comment: Performed at [facility], [address] [place], [place] [address]  Troponin I (High Sensitivity)     Status: Abnormal   Collection Time: 11/27/23  9:13 PM  Result Value Ref Range   Troponin I (High Sensitivity) 495 (HH) <18 ng/L    Comment: CRITICAL RESULT CALLED TO, READ BACK BY AND VERIFIED WITH CAMMER E, RN 2206 11/27/2023 SANDOVAL K (NOTE) Elevated  high sensitivity troponin I (hsTnI) values and significant  changes across serial measurements may suggest ACS but many other  chronic and acute conditions are known to elevate hsTnI results.  Refer to the "Links" section for chest pain algorithms and additional  guidance. Performed at [facility], [address], [place], [place] [address]   I-STAT, Kristeen Peto 8     Status:  Abnormal   Collection Time: 11/27/23  9:21 PM  Result Value Ref Range   Sodium 141 135 - 145 mmol/L   Potassium 3.7 3.5 - 5.1 mmol/L   Chloride 102 98 - 111 mmol/L   BUN <3 (L) 8 - 23 mg/dL   Creatinine, Ser 6.21 0.61 - 1.24 mg/dL   Glucose, Bld 308 (H) 70 - 99 mg/dL    Comment: Glucose reference range applies only to samples taken after fasting for at least 8 hours.   Calcium , Ion 1.16 1.15 - 1.40 mmol/L   TCO2 25 22 - 32 mmol/L   Hemoglobin 15.3 13.0 - 17.0 g/dL   HCT 65.7 84.6 - 96.2 %  CG4 I-STAT (Lactic acid)     Status: Abnormal   Collection Time: 11/27/23  9:23 PM  Result Value Ref Range   Lactic Acid, Venous 2.1 (HH) 0.5 - 1.9 mmol/L  CG4 I-STAT (Lactic acid)     Status: Abnormal   Collection Time: 11/27/23  9:46 PM  Result Value Ref Range   Lactic Acid, Venous 0.3 (L) 0.5 - 1.9 mmol/L  POCT Activated clotting time     Status: None   Collection Time: 11/27/23  9:50 PM  Result Value Ref Range   Activated Clotting Time 487 seconds    Comment: Reference range 74-137 seconds for patients not on anticoagulant therapy.  POCT Activated clotting time     Status: None   Collection Time: 11/27/23 10:02 PM  Result Value Ref Range   Activated Clotting Time 348 seconds    Comment: Reference range 74-137 seconds for patients not on anticoagulant therapy.  POCT Activated clotting time     Status: None   Collection Time: 11/27/23 10:32 PM  Result Value Ref Range   Activated Clotting Time 337 seconds    Comment: Reference range 74-137 seconds for patients not on anticoagulant therapy.  MRSA Next Gen by PCR, Nasal     Status: None   Collection Time: 11/27/23 11:00 PM   Specimen: Nasal Mucosa; Nasal Swab  Result Value Ref Range   MRSA by PCR Next Gen NOT DETECTED NOT DETECTED    Comment: (NOTE) The GeneXpert MRSA Assay (FDA approved for NASAL specimens only), is one component of a comprehensive MRSA colonization surveillance program. It is not intended to diagnose MRSA infection nor  to guide or monitor treatment for MRSA infections. Test performance is not FDA approved in patients less than 71 years old. Performed at [facility], [address] [place], [place] [address]   Protime-INR     Status: None   Collection Time: 11/27/23 11:24 PM  Result Value Ref Range   Prothrombin Time 14.0 11.4 - 15.2 seconds   INR 1.1 0.8 - 1.2    Comment: (NOTE) INR goal varies based on device and disease states. Performed at [facility], [address], [place], [place] [address]   Lipoprotein A (LPA)     Status: None   Collection Time: 11/28/23  2:27 AM  Result Value Ref Range   Lipoprotein (a) 15.4 <75.0 nmol/L    Comment: (  NOTE) Note:  Values greater than or equal to 75.0 nmol/L may       indicate an independent risk factor for CHD,       but must be evaluated with caution when applied       to non-Caucasian populations due to the       influence of genetic factors on Lp(a) across       ethnicities. Performed At: [facility] [address] [place], [place] [id number] Pearlean Botts MD WU:[id number]   Basic metabolic panel     Status: Abnormal   Collection Time: 11/28/23  2:27 AM  Result Value Ref Range   Sodium 140 135 - 145 mmol/L   Potassium 3.7 3.5 - 5.1 mmol/L   Chloride 104 98 - 111 mmol/L   CO2 29 22 - 32 mmol/L   Glucose, Bld 115 (H) 70 - 99 mg/dL    Comment: Glucose reference range applies only to samples taken after fasting for at least 8 hours.   BUN <5 (L) 8 - 23 mg/dL   Creatinine, Ser 9.56 0.61 - 1.24 mg/dL   Calcium  9.0 8.9 - 10.3 mg/dL   GFR, Estimated >21 >30 mL/min    Comment: (NOTE) Calculated using the CKD-EPI Creatinine Equation (2021)    Anion gap 7 5 - 15    Comment: Performed at [facility], [address], [place], [place] [address]  Lipid panel     Status: Abnormal   Collection Time: 11/28/23  2:27 AM  Result Value Ref Range   Cholesterol 190 0 - 200 mg/dL   Triglycerides 469 (H) <150 mg/dL   HDL 41  >62 mg/dL   Total CHOL/HDL Ratio 4.6 RATIO   VLDL 46 (H) 0 - 40 mg/dL   LDL Cholesterol 952 (H) 0 - 99 mg/dL    Comment:        Total Cholesterol/HDL:CHD Risk Coronary Heart Disease Risk Table                     Men   Women  1/2 Average Risk   3.4   3.3  Average Risk       5.0   4.4  2 X Average Risk   9.6   7.1  3 X Average Risk  23.4   11.0        Use the calculated Patient Ratio above and the CHD Risk Table to determine the patient's CHD Risk.        ATP III CLASSIFICATION (LDL):  <100     mg/dL   Optimal  841-324  mg/dL   Near or Above                    Optimal  130-159  mg/dL   Borderline  401-027  mg/dL   High  >253     mg/dL   Very High Performed at [facility], [address], [place], [place] [address]   CBC     Status: None   Collection Time: 11/28/23  2:27 AM  Result Value Ref Range   WBC 9.3 4.0 - 10.5 K/uL   RBC 5.37 4.22 - 5.81 MIL/uL   Hemoglobin 15.2 13.0 - 17.0 g/dL   HCT 34.7 42.5 - 95.6 %   MCV 82.7 80.0 - 100.0 fL   MCH 28.3 26.0 - 34.0 pg   MCHC 34.2 30.0 - 36.0 g/dL   RDW 38.7 56.4 - 33.2 %   Platelets  215 150 - 400 K/uL   nRBC 0.0 0.0 - 0.2 %    Comment: Performed at [facility], [address], [place], [place] [address]  ECHOCARDIOGRAM COMPLETE     Status: None   Collection Time: 11/28/23 10:00 AM  Result Value Ref Range   Weight 3,601.43 oz   Height 68 in   BP 127/105 mmHg   Single Plane A2C EF 45.7 %   Single Plane A4C EF 46.4 %   Calc EF 45.1 %   S' Lateral 3.80 cm   AR max vel 2.90 cm2   AV Peak grad 4.8 mmHg   Ao pk vel 1.09 m/s   Area-P 1/2 4.06 cm2   Est EF 40 - 45%   Basic metabolic panel     Status: Abnormal   Collection Time: 11/29/23  8:02 AM  Result Value Ref Range   Sodium 134 (L) 135 - 145 mmol/L   Potassium 3.7 3.5 - 5.1 mmol/L   Chloride 99 98 - 111 mmol/L   CO2 24 22 - 32 mmol/L   Glucose, Bld 139 (H) 70 - 99 mg/dL    Comment: Glucose reference range applies only to samples taken after fasting for  at least 8 hours.   BUN 6 (L) 8 - 23 mg/dL   Creatinine, Ser 1.91 0.61 - 1.24 mg/dL   Calcium  9.0 8.9 - 10.3 mg/dL   GFR, Estimated >47 >82 mL/min    Comment: (NOTE) Calculated using the CKD-EPI Creatinine Equation (2021)    Anion gap 11 5 - 15    Comment: Performed at [facility], [address], [place], [place] [address]  CBC     Status: None   Collection Time: 11/29/23  8:02 AM  Result Value Ref Range   WBC 9.6 4.0 - 10.5 K/uL   RBC 5.13 4.22 - 5.81 MIL/uL   Hemoglobin 14.5 13.0 - 17.0 g/dL   HCT 30.8 65.7 - 84.6 %   MCV 82.5 80.0 - 100.0 fL   MCH 28.3 26.0 - 34.0 pg   MCHC 34.3 30.0 - 36.0 g/dL   RDW 96.2 95.2 - 84.1 %   Platelets 210 150 - 400 K/uL   nRBC 0.0 0.0 - 0.2 %    Comment: Performed at [facility], [address], [place], [place] [address]  Magnesium      Status: None   Collection Time: 11/29/23  8:02 AM  Result Value Ref Range   Magnesium  1.8 1.7 - 2.4 mg/dL    Comment: Performed at [facility], [address], [place], [place] [address]  Basic Metabolic Panel (BMET)     Status: Abnormal   Collection Time: 12/08/23  9:38 AM  Result Value Ref Range   Sodium 136 135 - 145 mmol/L   Potassium 3.9 3.5 - 5.1 mmol/L   Chloride 100 98 - 111 mmol/L   CO2 26 22 - 32 mmol/L   Glucose, Bld 121 (H) 70 - 99 mg/dL    Comment: Glucose reference range applies only to samples taken after fasting for at least 8 hours.   BUN 5 (L) 8 - 23 mg/dL   Creatinine, Ser 5.36 0.61 - 1.24 mg/dL   Calcium  8.8 (L) 8.9 - 10.3 mg/dL   GFR, Estimated >64 >40 mL/min    Comment: (NOTE) Calculated using the CKD-EPI Creatinine Equation (2021)    Anion gap 10 5 - 15    Comment: Performed at [facility],  [address], [place], [place] [address]  B Nat Peptide     Status: Abnormal   Collection Time: 12/08/23  9:38 AM  Result Value Ref Range   B Natriuretic Peptide 133.2 (H) 0.0 - 100.0 pg/mL    Comment: Performed at [facility], [address]  [place], [place] [address]  Iron and TIBC     Status: None   Collection Time: 12/08/23  9:38 AM  Result Value Ref Range   Iron 104 45 - 182 ug/dL   TIBC 188 416 - 606 ug/dL   Saturation Ratios 27 17.9 - 39.5 %   UIBC 281 ug/dL    Comment: Performed at [facility], [address], [place], [place] [address]  Ferritin     Status: None   Collection Time: 12/08/23  9:38 AM  Result Value Ref Range   Ferritin 116 24 - 336 ng/mL    Comment: Performed at [facility], [address], [place], [place] [address]  Basic Metabolic Panel (BMET)     Status: Abnormal   Collection Time: 12/15/23  9:00 AM  Result Value Ref Range   Sodium 138 135 - 145 mmol/L   Potassium 4.5 3.5 - 5.1 mmol/L   Chloride 100 98 - 111 mmol/L   CO2 27 22 - 32 mmol/L   Glucose, Bld 110 (H) 70 - 99 mg/dL    Comment: Glucose reference range applies only to samples taken after fasting for at least 8 hours.   BUN 6 (L) 8 - 23 mg/dL   Creatinine, Ser 3.55 0.61 - 1.24 mg/dL   Calcium  9.5 8.9 - 10.3 mg/dL   GFR, Estimated >73 >22 mL/min    Comment: (NOTE) Calculated using the CKD-EPI Creatinine Equation (2021)    Anion gap 11 5 - 15    Comment: Performed at [facility], [address] [place], [place] [address]  Basic Metabolic Panel (BMET)     Status: Abnormal   Collection Time: 12/22/23 10:27 AM  Result Value Ref Range   Sodium 139 135 - 145 mmol/L   Potassium 4.7 3.5 - 5.1 mmol/L   Chloride 103 98 - 111 mmol/L   CO2 27 22 - 32 mmol/L   Glucose, Bld 119 (H) 70 - 99 mg/dL    Comment: Glucose reference range applies only to samples taken after fasting for at least 8 hours.   BUN 10 8 - 23 mg/dL   Creatinine, Ser 7.06 0.61 - 1.24 mg/dL   Calcium  9.6 8.9 - 10.3 mg/dL   GFR, Estimated >23 >76 mL/min    Comment: (NOTE) Calculated using the CKD-EPI Creatinine Equation (2021)    Anion gap 9 5 - 15    Comment: Performed at [facility], [address], [place], [place] [address]  B Nat Peptide      Status: None   Collection Time: 12/22/23 10:27 AM  Result Value Ref Range   B Natriuretic Peptide 98.5 0.0 - 100.0 pg/mL    Comment: Performed at [facility], [address] [place], [place] [address]  CEA (Access)     Status: None   Collection Time: 01/30/24  1:57 PM  Result Value Ref Range   CEA ([facility]) 1.68 0.00 - 5.00 ng/mL    Comment: (NOTE) This test was performed using [employer] Coulter's paramagnetic chemiluminescent immunoassay. Values obtained from different assay methods cannot be used interchangeably. Please note that up to 8% of patients who  smoke may see values 5.1-10.0 ng/ml and 1% of patients who smoke may see CEA levels >10.0 ng/ml. Performed at [facility], [address] [place], [place] [address]   CMP (Cancer Center only)     Status: Abnormal   Collection Time: 01/30/24  1:57 PM  Result Value Ref Range   Sodium 138 135 - 145 mmol/L   Potassium 4.6 3.5 - 5.1 mmol/L   Chloride 100 98 - 111 mmol/L   CO2 26 22 - 32 mmol/L   Glucose, Bld 115 (H) 70 - 99 mg/dL    Comment: Glucose reference range applies only to samples taken after fasting for at least 8 hours.   BUN 6 (L) 8 - 23 mg/dL   Creatinine 6.04 5.40 - 1.24 mg/dL   Calcium  10.1 8.9 - 10.3 mg/dL   Total Protein 7.9 6.5 - 8.1 g/dL   Albumin 4.6 3.5 - 5.0 g/dL   AST 31 15 - 41 U/L   ALT 29 0 - 44 U/L   Alkaline Phosphatase 95 38 - 126 U/L   Total Bilirubin 0.4 0.0 - 1.2 mg/dL   GFR, Estimated >98 >11 mL/min    Comment: (NOTE) Calculated using the CKD-EPI Creatinine Equation (2021)    Anion gap 12 5 - 15    Comment: Performed at [facility], [address] [place], [place] [address]  CBC with Differential (Cancer Center Only)     Status: Abnormal   Collection Time: 01/30/24  1:57 PM  Result Value Ref Range   WBC Count 7.9 4.0 - 10.5 K/uL   RBC 5.92 (H) 4.22 - 5.81 MIL/uL   Hemoglobin 17.2 (H) 13.0 - 17.0 g/dL   HCT 29.5 62.1 - 30.8 %   MCV 86.8 80.0 -  100.0 fL   MCH 29.1 26.0 - 34.0 pg   MCHC 33.5 30.0 - 36.0 g/dL   RDW 65.7 84.6 - 96.2 %   Platelet Count 250 150 - 400 K/uL   nRBC 0.0 0.0 - 0.2 %   Neutrophils Relative % 64 %   Neutro Abs 5.1 1.7 - 7.7 K/uL   Lymphocytes Relative 16 %   Lymphs Abs 1.2 0.7 - 4.0 K/uL   Monocytes Relative 13 %   Monocytes Absolute 1.0 0.1 - 1.0 K/uL   Eosinophils Relative 5 %   Eosinophils Absolute 0.4 0.0 - 0.5 K/uL   Basophils Relative 1 %   Basophils Absolute 0.1 0.0 - 0.1 K/uL   Immature Granulocytes 1 %   Abs Immature Granulocytes 0.07 0.00 - 0.07 K/uL    Comment: Performed at [facility], [address], [place], [place] [address]     RADIOGRAPHIC STUDIES:  I have personally reviewed the radiological images as listed and agree with the findings in the report.  CT CHEST ABDOMEN PELVIS W CONTRAST Result Date: 02/08/2024 CLINICAL DATA:  Rectal cancer restaging * Tracking Code: BO * EXAM: CT CHEST, ABDOMEN, AND PELVIS WITH CONTRAST TECHNIQUE: Multidetector CT imaging of the chest, abdomen and pelvis was performed following the standard protocol during bolus administration of intravenous contrast. RADIATION DOSE REDUCTION: This exam was performed according to the departmental dose-optimization program which includes automated exposure control, adjustment of the mA and/or kV according to patient size and/or use of iterative reconstruction technique. CONTRAST:  OMNIPAQUE  IOHEXOL  300 MG/ML  SOLN COMPARISON:  CT chest abdomen pelvis, 11/12/2019 FINDINGS: CT CHEST FINDINGS Cardiovascular: No significant vascular findings. Normal heart size. Left and right coronary artery calcifications. No pericardial  effusion. Mediastinum/Nodes: No enlarged mediastinal, hilar, or axillary lymph nodes. Thyroid  gland, trachea, and esophagus demonstrate no significant findings. Lungs/Pleura: Multiple new spiculated bilateral pulmonary nodules, largest in the posterior right upper lobe measuring 1.9 x  1.7 cm (series 4, image 32), additional index nodule of the medial left upper lobe measuring 1.3 x 1.2 cm (series 4, image 63). No pleural effusion or pneumothorax. Musculoskeletal: No chest wall abnormality. No acute osseous findings. CT ABDOMEN PELVIS FINDINGS Hepatobiliary: No focal liver abnormality is seen. Status post cholecystectomy. No biliary dilatation. Pancreas: Unremarkable. No pancreatic ductal dilatation or surrounding inflammatory changes. Spleen: Normal in size without significant abnormality. Adrenals/Urinary Tract: Adrenal glands are unremarkable. Kidneys are normal, without renal calculi, solid lesion, or hydronephrosis. Bladder is unremarkable. Stomach/Bowel: Stomach is within normal limits. Appendix appears normal. No evidence of bowel wall thickening, distention, or inflammatory changes. Previously seen circumferential superior rectal mass is no longer distinctly visible (series 2, image 112). Vascular/Lymphatic: No significant vascular findings are present. No enlarged abdominal or pelvic lymph nodes. Reproductive: No mass or other abnormality. Other: No abdominal wall hernia or abnormality. No ascites. Musculoskeletal: No acute osseous findings. IMPRESSION: 1. Multiple new spiculated bilateral pulmonary nodules, consistent with pulmonary metastases. 2. Previously seen circumferential superior rectal mass is no longer distinctly visible, presumably treated in the interval. 3. No evidence of lymphadenopathy or metastatic disease in the abdomen or pelvis. 4. Coronary artery disease. Electronically Signed   By: Fredricka Jenny M.D.   On: 02/08/2024 19:03    No orders of the defined types were placed in this encounter.    Future Appointments  Date Time Provider Department Center  03/12/2024 10:15 AM [facility] PHLEBOTOMIST [facility] None  03/12/2024 10:30 AM Dreyson Mishkin, Gale Jude, MD [facility] None  03/18/2024 10:30 AM [facility]V  03/25/2024 10:30 AM Valentina Gasman Karon Packer., NP [facility] H&V     This document was completed utilizing speech recognition software. Grammatical errors, random word insertions, pronoun errors, and incomplete sentences are an occasional consequence of this system due to software limitations, ambient noise, and hardware issues. Any formal questions or concerns about the content, text or information contained within the body of this dictation should be directly addressed to the provider for clarification.

## 2024-02-12 NOTE — Assessment & Plan Note (Signed)
 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  2 small perirectal nodes measuring 3 to 4 mm  Lower EUS 11/13/2019-tumor in the proximal rectum.  Proximal posterolateral rectal mass.  2 abnormal lymph nodes in the perirectal region.  Staged T3N1 by endorectal ultrasound.    Previously he was seen by Dr. Scherrie Curt in our clinic.  He received treatments with Xeloda /radiation 11/25/2019-01/01/2020  He was supposed to undergo surgical resection under the direction of Dr. Andy Bannister in June 2021.  Patient apparently had dental infections around that time and needed dental extractions and patient deferred surgery and did not seek follow-up care either with the surgeons or with our department.  He also did not have follow-up appointments with gastroenterology.  His PCP referred him back to his and he reestablished care with us  on 01/30/2024.  CEA was normal at 1.68.  CBCD and CMP were unremarkable.  Plan made for restaging CT scan of the chest, abdomen and pelvis and referral sent to GI for endoscopic evaluation.  Patient cannot get MRIs because of nerve stimulator in place.  On 02/08/2024, restaging CT chest, abdomen and pelvis showed multiple lung nodules, largest 1 measuring 1.9 cm, concerning for pulmonary metastatic disease.  Previously seen circumferential superior rectal mass was no longer distinctly visible.  No evidence of lymphadenopathy or metastatic disease in the abdomen or pelvis.  Plan for biopsy of the lung nodules by pulmonology or IR.  Further plan of care will be determined depending on biopsy results.

## 2024-02-12 NOTE — Progress Notes (Signed)
 68 year old male diagnosed with Rectal cancer in 2021. He is now under the care of Dr. Randye Buttner. Plan for restaging CT scan of the chest, abdomen and pelvis and referral sent to GI for endoscopic evaluation.   Acute ST elevation myocardial infarction (STEMI) due to occlusion of mid portion of left anterior descending (LAD) coronary artery [facility]) STEMI in March 2025, treated with two stents.  PMH includes Chemoradiation treatment in Feb 2021, CAD, STEMI, Stents, chronic migraine, dissection cerebral artery, Stroke.  Medications include Lasiz, Methadone , Ritalin, Toprol -XL, MVI, Miralax, Vitamin D -K.  Labs include Glucose 115 and BUN 6 on May 27.  Height: 5'8". Weight: 220 pounds 4.8 oz. May 27. UBW:220-225 pounds per patient. BMI: 33.5  No nutrition diagnosis at this time.  I spoke briefly with patient on the telephone. He reports he does have a decreased appetite but does not know if it is because of his recent heart attack or if it is the cancer. He "eats really well" and states his weight is stable. No nausea, vomiting, constipation or diarrhea. Brief information given on strategies for healthy diet. Explained that if he has treatment, he my benefit from additional nutrition information. Patient would like to talk with his doctor sooner than next appointment. Message given.  Will monitor weight and treatment plans and follow as needed. Patient encouraged to reach out to RD sooner if needed.

## 2024-02-16 ENCOUNTER — Encounter: Payer: Self-pay | Admitting: Acute Care

## 2024-02-16 ENCOUNTER — Encounter: Payer: Self-pay | Admitting: Emergency Medicine

## 2024-02-16 ENCOUNTER — Telehealth: Payer: Self-pay

## 2024-02-16 ENCOUNTER — Telehealth: Payer: Self-pay | Admitting: Internal Medicine

## 2024-02-16 ENCOUNTER — Ambulatory Visit (INDEPENDENT_AMBULATORY_CARE_PROVIDER_SITE_OTHER): Admitting: Acute Care

## 2024-02-16 VITALS — BP 121/74 | HR 73 | Ht 69.0 in | Wt 219.8 lb

## 2024-02-16 DIAGNOSIS — R918 Other nonspecific abnormal finding of lung field: Secondary | ICD-10-CM | POA: Diagnosis not present

## 2024-02-16 DIAGNOSIS — Z85048 Personal history of other malignant neoplasm of rectum, rectosigmoid junction, and anus: Secondary | ICD-10-CM | POA: Diagnosis not present

## 2024-02-16 NOTE — Telephone Encounter (Signed)
 Called cardiology to get clearance on patient for bronch with biopsy

## 2024-02-16 NOTE — Telephone Encounter (Signed)
   Patient Name: Derrick Johnson  DOB: 31-Oct-1955 MRN: [id number]  Primary Cardiologist: Arun K Thukkani, MD  Chart reviewed as part of pre-operative protocol coverage. He was seen by Charles Connor NP on 4/21 in clinic. Discussed with Renelda Carry, Mr. Monrreal was seen for follow-up on 12/25/23 and reported no acute changes in his health and was able to complete greater than 4 METS of activity without any difficulty. Therefore he would be an acceptable risk to proceed with scheduled procedure without any additional ischemic evaluation.   Patient is on DAPT with aspirin  and plavix . Per Dr. Lorie Rook, he can hold his plavix  5 days and best to stay on ASA 81 during the procedure since we are not very far out from his March PCI  I will route this encounter to requesting team to serve as preoperative recommendations.  Debria Fang, PA-C 02/16/2024, 3:43 PM

## 2024-02-16 NOTE — Telephone Encounter (Signed)
   Pre-operative Risk Assessment    Patient Name: Derrick Johnson  DOB: April 17, 1956 MRN: [id number]   Date of last office visit: 12/25/2023 Date of next office visit: 03/25/2024   Request for Surgical Clearance    Procedure:  Bronchoscopy with biopsy   Date of Surgery:  Clearance TBD                                Surgeon:  Dr. Baldwin Levee  Surgeon's Group or Practice Name:  [facility]  Phone number:  [phone] Fax number:  [phone]   Type of Clearance Requested:   - Medical  - Pharmacy:  Hold Aspirin  and Clopidogrel  (Plavix ) Asprin 2 days prior and Plavis 5 days prior    Type of Anesthesia:  General    Additional requests/questions:    [name]   02/16/2024, 10:39 AM

## 2024-02-16 NOTE — Progress Notes (Signed)
 History of Present Illness Derrick Johnson is a 68 y.o. male never smoker with history of rectal cancer in 2021 referred by Dr. Randye Buttner  02/2024 for consideration of bronchoscopy with biopsies of a   Synopsis Derrick Johnson is a 68 y.o. gentleman with a past medical history of rectal cancer diagnosed in February 2021, treated with concurrent chemoradiation using Xeloda , completed treatment in April 2021, patient did not follow-up with surgery or with oncology departments after this.  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  He was referred back to Dr. Randye Buttner clinic by his PCP to reestablish care for his history of rectal cancer. On 02/08/2024, restaging CT chest, abdomen and pelvis showed multiple lung nodules, largest 1 measuring 1.9 cm, concerning for pulmonary metastatic disease. Previously seen circumferential superior rectal mass was no longer distinctly visible. No evidence of lymphadenopathy or metastatic disease in the abdomen or pelvis. Pt. Was referred for consideration of bronchoscopy with biopsy to get tissue sampling of the multiple lung nodules. He has not yet had PET imaging.   02/16/2024 Pt. Presents for evaluation of new lung nodules on imaging concerning for metastatic disease. He is here today with his wife.  He denies any weight loss of hemoptysis. He has a PMH of MI in 11/2023, he is on Plavix  and 81 mg ASA daily, HF, Asthma ( No maintenance, just albuterol  prn), Previous stroke after trauma,rectal cancer in 2021, seasonal allergies, headaches, SP cholecystectomy in 1995, spinal stimulator placement in 2004, and sinus surgery for deviated septum. He is married, lives with his wife, retired. He has no children.  He states he has dyspnea with activity, a non productive cough,Acid heartburn,and Loss of  appetite.  We have reviewed his CT Chest. He has multiple new spiculated bilateral lung nodules consistent with pulmonary metastases. PET scan was ordered today by me to complete staging and to determine if there is lymph node involvement, as this may indicate need for EBUS as well as biopsy of the lung nodules of concern.   He will need cardiology clearance to come off Plavix  x 5 days, and ASA x 2 days  before the procedure. With his recent MI and stent placement , they need to clear him. They have been notified today. We will await their approval. I am going to move forward with bronchoscopy with biopsy orders with the hope he will be cleared.   Both patient and his wife are in agreement with this plan.He has been scheduled for 02/27/2024.   Test Results: 02/08/2024 CT Chest Multiple new spiculated bilateral pulmonary nodules, consistent with pulmonary metastases. 2. Previously seen circumferential superior rectal mass is no longer distinctly visible, presumably treated in the interval. 3. No evidence of lymphadenopathy or metastatic disease in the abdomen or pelvis. 4. Coronary artery disease.     Latest Ref Rng & Units 01/30/2024    1:57 PM 11/29/2023    8:02 AM 11/28/2023    2:27 AM  CBC  WBC 4.0 - 10.5 K/uL 7.9  9.6  9.3   Hemoglobin 13.0 - 17.0 g/dL 40.9  81.1  91.4   Hematocrit 39.0 - 52.0 % 51.4  42.3  44.4   Platelets 150 - 400 K/uL 250  210  215        Latest Ref Rng & Units 01/30/2024    1:57 PM 12/22/2023  10:27 AM 12/15/2023    9:00 AM  BMP  Glucose 70 - 99 mg/dL 161  096  045   BUN 8 - 23 mg/dL 6  10  6    Creatinine 0.61 - 1.24 mg/dL 4.09  8.11  9.14   Sodium 135 - 145 mmol/L 138  139  138   Potassium 3.5 - 5.1 mmol/L 4.6  4.7  4.5   Chloride 98 - 111 mmol/L 100  103  100   CO2 22 - 32 mmol/L 26  27  27    Calcium  8.9 - 10.3 mg/dL 78.2  9.6  9.5     BNP    Component Value Date/Time   BNP 98.5 12/22/2023 1027    ProBNP No results found for:  PROBNP  PFT No results found for: FEV1PRE, FEV1POST, FVCPRE, FVCPOST, TLC, DLCOUNC, PREFEV1FVCRT, PSTFEV1FVCRT  CT CHEST ABDOMEN PELVIS W CONTRAST Result Date: 02/08/2024 CLINICAL DATA:  Rectal cancer restaging * Tracking Code: BO * EXAM: CT CHEST, ABDOMEN, AND PELVIS WITH CONTRAST TECHNIQUE: Multidetector CT imaging of the chest, abdomen and pelvis was performed following the standard protocol during bolus administration of intravenous contrast. RADIATION DOSE REDUCTION: This exam was performed according to the departmental dose-optimization program which includes automated exposure control, adjustment of the mA and/or kV according to patient size and/or use of iterative reconstruction technique. CONTRAST:  OMNIPAQUE  IOHEXOL  300 MG/ML  SOLN COMPARISON:  CT chest abdomen pelvis, 11/12/2019 FINDINGS: CT CHEST FINDINGS Cardiovascular: No significant vascular findings. Normal heart size. Left and right coronary artery calcifications. No pericardial effusion. Mediastinum/Nodes: No enlarged mediastinal, hilar, or axillary lymph nodes. Thyroid  gland, trachea, and esophagus demonstrate no significant findings. Lungs/Pleura: Multiple new spiculated bilateral pulmonary nodules, largest in the posterior right upper lobe measuring 1.9 x 1.7 cm (series 4, image 32), additional index nodule of the medial left upper lobe measuring 1.3 x 1.2 cm (series 4, image 63). No pleural effusion or pneumothorax. Musculoskeletal: No chest wall abnormality. No acute osseous findings. CT ABDOMEN PELVIS FINDINGS Hepatobiliary: No focal liver abnormality is seen. Status post cholecystectomy. No biliary dilatation. Pancreas: Unremarkable. No pancreatic ductal dilatation or surrounding inflammatory changes. Spleen: Normal in size without significant abnormality. Adrenals/Urinary Tract: Adrenal glands are unremarkable. Kidneys are normal, without renal calculi, solid lesion, or hydronephrosis. Bladder is unremarkable.  Stomach/Bowel: Stomach is within normal limits. Appendix appears normal. No evidence of bowel wall thickening, distention, or inflammatory changes. Previously seen circumferential superior rectal mass is no longer distinctly visible (series 2, image 112). Vascular/Lymphatic: No significant vascular findings are present. No enlarged abdominal or pelvic lymph nodes. Reproductive: No mass or other abnormality. Other: No abdominal wall hernia or abnormality. No ascites. Musculoskeletal: No acute osseous findings. IMPRESSION: 1. Multiple new spiculated bilateral pulmonary nodules, consistent with pulmonary metastases. 2. Previously seen circumferential superior rectal mass is no longer distinctly visible, presumably treated in the interval. 3. No evidence of lymphadenopathy or metastatic disease in the abdomen or pelvis. 4. Coronary artery disease. Electronically Signed   By: Fredricka Jenny M.D.   On: 02/08/2024 19:03     Past medical hx Past Medical History:  Diagnosis Date   Asthma    Back contusion    Chronic migraine    Chronic pain    Dissection of cerebral artery (HCC)    Electrocution and nonfatal effects of electric current    Fibromyalgia    Rectal cancer (HCC)    Stroke [facility])      Social History   Tobacco Use  Smoking status: Never    Passive exposure: Never   Smokeless tobacco: Never  Vaping Use   Vaping status: Never Used  Substance Use Topics   Alcohol use: Not Currently   Drug use: No    Mr.Zarrella reports that he has never smoked. He has never been exposed to tobacco smoke. He has never used smokeless tobacco. He reports that he does not currently use alcohol. He reports that he does not use drugs.  Tobacco Cessation: Counseling given: Not Answered Never smoker   Past surgical hx, Family hx, Social hx all reviewed.  Current Outpatient Medications on File Prior to Visit  Medication Sig   albuterol  (PROVENTIL ) (2.5 MG/3ML) 0.083% nebulizer solution Take 2.5 mg by  nebulization every 6 (six) hours as needed for wheezing or shortness of breath.    albuterol  (VENTOLIN  HFA) 108 (90 Base) MCG/ACT inhaler Inhale 1-2 puffs into the lungs every 6 (six) hours as needed for wheezing or shortness of breath.   aspirin  81 MG chewable tablet Chew 1 tablet (81 mg total) by mouth daily.   atorvastatin  (LIPITOR) 80 MG tablet Take 1 tablet (80 mg total) by mouth daily.   clopidogrel  (PLAVIX ) 75 MG tablet Take 1 tablet (75 mg total) by mouth daily.   cyclobenzaprine (FLEXERIL) 10 MG tablet Take 10 mg by mouth every 8 (eight) hours as needed for muscle spasms.   furosemide  (LASIX ) 20 MG tablet Take 1 tablet (20 mg total) by mouth daily.   methadone  (DOLOPHINE ) 10 MG tablet Take 1-2 tablets (10-20 mg total) by mouth every 12 (twelve) hours. (Patient taking differently: Take 10 mg by mouth in the morning, at noon, in the evening, and at bedtime.)   methylphenidate (RITALIN) 20 MG tablet Take 20 mg by mouth 3 (three) times daily.   metoprolol  succinate (TOPROL -XL) 25 MG 24 hr tablet Take 1 tablet (25 mg total) by mouth at bedtime.   Multiple Vitamin (MULTIVITAMIN) capsule Take 1 capsule by mouth daily.   nitroGLYCERIN  (NITROSTAT ) 0.4 MG SL tablet Place 1 tablet (0.4 mg total) under the tongue every 5 (five) minutes x 3 doses as needed for chest pain.   polyethylene glycol (MIRALAX) 17 g packet Take 17 g by mouth daily.   sacubitril-valsartan (ENTRESTO ) 24-26 MG Take 1 tablet by mouth 2 (two) times daily.   spironolactone  (ALDACTONE ) 25 MG tablet Take 0.5 tablets (12.5 mg total) by mouth daily.   SUMAtriptan  (IMITREX ) 100 MG tablet Take 100 mg by mouth every 2 (two) hours as needed for migraine.   tamsulosin (FLOMAX) 0.4 MG CAPS capsule Take 0.4-0.8 mg by mouth See admin instructions. Take 0.4mg  (1 capsule) by mouth daily for 2 weeks, then increase to 0.8mg  (2 capsules) daily,   traZODone  (DESYREL ) 150 MG tablet TAKE 1 TABLET BY MOUTH DAILY (Patient taking differently: Take 75 mg by  mouth at bedtime as needed for sleep.)   VITAMIN D -VITAMIN K PO Take 1 capsule by mouth daily. 10,000IU/ 100 mcg   Current Facility-Administered Medications on File Prior to Visit  Medication   clindamycin  (CLEOCIN ) 900 mg in dextrose  5 % 50 mL IVPB   And   gentamicin  (GARAMYCIN ) 5 mg/kg in dextrose  5 % 50 mL IVPB     Allergies  Allergen Reactions   Latex Other (See Comments)    Skin irritation    Review Of Systems:  Constitutional:   No  weight loss, night sweats,  Fevers, chills, fatigue, or  lassitude.  HEENT:   No headaches,  Difficulty swallowing,  Tooth/dental problems, or  Sore throat,                No sneezing, itching, ear ache, nasal congestion, post nasal drip,   CV:  No chest pain,  Orthopnea, PND, swelling in lower extremities, anasarca, dizziness, palpitations, syncope.   GI  No heartburn, indigestion, abdominal pain, nausea, vomiting, diarrhea, change in bowel habits, loss of appetite, bloody stools.   Resp: + shortness of breath with exertion olessat rest.  No excess mucus, no productive cough,  + non-productive cough,  No coughing up of blood.  No change in color of mucus.  No wheezing.  No chest wall deformity  Skin: no rash or lesions.  GU: no dysuria, change in color of urine, no urgency or frequency.  No flank pain, no hematuria   MS:  No joint pain or swelling.  No decreased range of motion.  + back pain.  Psych:  No change in mood or affect. No depression or anxiety.  No memory loss.Appropriately concerned.   Vital Signs BP 121/74 (BP Location: Left Arm, Patient Position: Sitting, Cuff Size: Normal)   Pulse 73   Ht 5' 9 (1.753 m)   Wt 219 lb 12.8 oz (99.7 kg)   SpO2 97%   BMI 32.46 kg/m    Physical Exam:  General- No distress,  A&Ox3, pleasant ENT: No sinus tenderness, TM clear, pale nasal mucosa, no oral exudate,no post nasal drip, no LAN Cardiac: S1, S2, regular rate and rhythm, no murmur Chest: No wheeze/ rales/ dullness; no accessory  muscle use, no nasal flaring, no sternal retractions, slightly diminished per bases Abd.: Soft Non-tender, ND, BS +, Body mass index is 32.46 kg/m.  Ext: No clubbing cyanosis, edema, no obvious deformities Neuro:  normal strength, MAE x 4, A&O x 3, appropriate Skin: No rashes, warm and dry, no obvious skin lesions  Psych: normal mood and behavior   Assessment/Plan Multiple new spiculated bilateral pulmonary nodules, consistent with pulmonary metastases. History of rectal cancer 2021. Never smoker Plan We have discussed that the best option for determining what the lung nodules are found on CT scan is to do a bronchoscopy with biopsies. I have placed an order for a bronchoscopy with biopsies.  We have discussed the procedure in detail.  We have reviewed the risks and benefits of the procedure. These include bleeding, infection, puncture of the lung, and adverse reaction to anesthesia. You have agreed to proceed with biopsy to evaluate the lung nodule of concern Your procedure will be done by Dr. Racheal Buddle. You will receive a letter today with date time and information pertaining to the procedure. We are calling a cardiologist to get clearance for the procedure. You will need to hold Plavix  x 5 days prior to the procedure You will need to hold your aspirin  1 to 2 days prior to the procedure this will be based on what your cardiologist would prefer. You will need someone to drive you to the procedure, stay with you during the procedure, and stay with you after the procedure. You will also need someone to stay with you for 24 hours after anesthesia to ensure you have cleared and are doing well. You will follow-up with me 1 week after the procedure to review the results and to ensure you are doing well. Call if you need us  prior to the procedure or if you have any questions at all. Please contact office for sooner follow up if symptoms do not improve or worsen or  seek emergency care   I  have ordered a PET scan stage and to see if there are any additional procedures needed .  Call if you need us  .   Please contact office for sooner follow up if symptoms do not improve or worsen or seek emergency care      I spent 35 minutes dedicated to the care of this patient on the date of this encounter to include pre-visit review of records, face-to-face time with the patient discussing conditions above, post visit ordering of testing, clinical documentation with the electronic health record, making appropriate referrals as documented, and communicating necessary information to the patient's healthcare team.   Raejean Bullock, NP 02/16/2024  10:19 AM

## 2024-02-16 NOTE — H&P (View-Only) (Signed)
 History of Present Illness Derrick Johnson is a 68 y.o. male never smoker with history of rectal cancer in 2021 referred by Dr. Randye Buttner  02/2024 for consideration of bronchoscopy with biopsies of a   Synopsis Derrick Johnson is a 68 y.o. gentleman with a past medical history of rectal cancer diagnosed in February 2021, treated with concurrent chemoradiation using Xeloda , completed treatment in April 2021, patient did not follow-up with surgery or with oncology departments after this.  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  He was referred back to Dr. Randye Buttner clinic by his PCP to reestablish care for his history of rectal cancer. On 02/08/2024, restaging CT chest, abdomen and pelvis showed multiple lung nodules, largest 1 measuring 1.9 cm, concerning for pulmonary metastatic disease. Previously seen circumferential superior rectal mass was no longer distinctly visible. No evidence of lymphadenopathy or metastatic disease in the abdomen or pelvis. Pt. Was referred for consideration of bronchoscopy with biopsy to get tissue sampling of the multiple lung nodules. He has not yet had PET imaging.   02/16/2024 Pt. Presents for evaluation of new lung nodules on imaging concerning for metastatic disease. He is here today with his wife.  He denies any weight loss of hemoptysis. He has a PMH of MI in 11/2023, he is on Plavix  and 81 mg ASA daily, HF, Asthma ( No maintenance, just albuterol  prn), Previous stroke after trauma,rectal cancer in 2021, seasonal allergies, headaches, SP cholecystectomy in 1995, spinal stimulator placement in 2004, and sinus surgery for deviated septum. He is married, lives with his wife, retired. He has no children.  He states he has dyspnea with activity, a non productive cough,Acid heartburn,and Loss of  appetite.  We have reviewed his CT Chest. He has multiple new spiculated bilateral lung nodules consistent with pulmonary metastases. PET scan was ordered today by me to complete staging and to determine if there is lymph node involvement, as this may indicate need for EBUS as well as biopsy of the lung nodules of concern.   He will need cardiology clearance to come off Plavix  x 5 days, and ASA x 2 days  before the procedure. With his recent MI and stent placement , they need to clear him. They have been notified today. We will await their approval. I am going to move forward with bronchoscopy with biopsy orders with the hope he will be cleared.   Both patient and his wife are in agreement with this plan.He has been scheduled for 02/27/2024.   Test Results: 02/08/2024 CT Chest Multiple new spiculated bilateral pulmonary nodules, consistent with pulmonary metastases. 2. Previously seen circumferential superior rectal mass is no longer distinctly visible, presumably treated in the interval. 3. No evidence of lymphadenopathy or metastatic disease in the abdomen or pelvis. 4. Coronary artery disease.     Latest Ref Rng & Units 01/30/2024    1:57 PM 11/29/2023    8:02 AM 11/28/2023    2:27 AM  CBC  WBC 4.0 - 10.5 K/uL 7.9  9.6  9.3   Hemoglobin 13.0 - 17.0 g/dL 40.9  81.1  91.4   Hematocrit 39.0 - 52.0 % 51.4  42.3  44.4   Platelets 150 - 400 K/uL 250  210  215        Latest Ref Rng & Units 01/30/2024    1:57 PM 12/22/2023  10:27 AM 12/15/2023    9:00 AM  BMP  Glucose 70 - 99 mg/dL 161  096  045   BUN 8 - 23 mg/dL 6  10  6    Creatinine 0.61 - 1.24 mg/dL 4.09  8.11  9.14   Sodium 135 - 145 mmol/L 138  139  138   Potassium 3.5 - 5.1 mmol/L 4.6  4.7  4.5   Chloride 98 - 111 mmol/L 100  103  100   CO2 22 - 32 mmol/L 26  27  27    Calcium  8.9 - 10.3 mg/dL 78.2  9.6  9.5     BNP    Component Value Date/Time   BNP 98.5 12/22/2023 1027    ProBNP No results found for:  PROBNP  PFT No results found for: FEV1PRE, FEV1POST, FVCPRE, FVCPOST, TLC, DLCOUNC, PREFEV1FVCRT, PSTFEV1FVCRT  CT CHEST ABDOMEN PELVIS W CONTRAST Result Date: 02/08/2024 CLINICAL DATA:  Rectal cancer restaging * Tracking Code: BO * EXAM: CT CHEST, ABDOMEN, AND PELVIS WITH CONTRAST TECHNIQUE: Multidetector CT imaging of the chest, abdomen and pelvis was performed following the standard protocol during bolus administration of intravenous contrast. RADIATION DOSE REDUCTION: This exam was performed according to the departmental dose-optimization program which includes automated exposure control, adjustment of the mA and/or kV according to patient size and/or use of iterative reconstruction technique. CONTRAST:  OMNIPAQUE  IOHEXOL  300 MG/ML  SOLN COMPARISON:  CT chest abdomen pelvis, 11/12/2019 FINDINGS: CT CHEST FINDINGS Cardiovascular: No significant vascular findings. Normal heart size. Left and right coronary artery calcifications. No pericardial effusion. Mediastinum/Nodes: No enlarged mediastinal, hilar, or axillary lymph nodes. Thyroid  gland, trachea, and esophagus demonstrate no significant findings. Lungs/Pleura: Multiple new spiculated bilateral pulmonary nodules, largest in the posterior right upper lobe measuring 1.9 x 1.7 cm (series 4, image 32), additional index nodule of the medial left upper lobe measuring 1.3 x 1.2 cm (series 4, image 63). No pleural effusion or pneumothorax. Musculoskeletal: No chest wall abnormality. No acute osseous findings. CT ABDOMEN PELVIS FINDINGS Hepatobiliary: No focal liver abnormality is seen. Status post cholecystectomy. No biliary dilatation. Pancreas: Unremarkable. No pancreatic ductal dilatation or surrounding inflammatory changes. Spleen: Normal in size without significant abnormality. Adrenals/Urinary Tract: Adrenal glands are unremarkable. Kidneys are normal, without renal calculi, solid lesion, or hydronephrosis. Bladder is unremarkable.  Stomach/Bowel: Stomach is within normal limits. Appendix appears normal. No evidence of bowel wall thickening, distention, or inflammatory changes. Previously seen circumferential superior rectal mass is no longer distinctly visible (series 2, image 112). Vascular/Lymphatic: No significant vascular findings are present. No enlarged abdominal or pelvic lymph nodes. Reproductive: No mass or other abnormality. Other: No abdominal wall hernia or abnormality. No ascites. Musculoskeletal: No acute osseous findings. IMPRESSION: 1. Multiple new spiculated bilateral pulmonary nodules, consistent with pulmonary metastases. 2. Previously seen circumferential superior rectal mass is no longer distinctly visible, presumably treated in the interval. 3. No evidence of lymphadenopathy or metastatic disease in the abdomen or pelvis. 4. Coronary artery disease. Electronically Signed   By: Fredricka Jenny M.D.   On: 02/08/2024 19:03     Past medical hx Past Medical History:  Diagnosis Date   Asthma    Back contusion    Chronic migraine    Chronic pain    Dissection of cerebral artery (HCC)    Electrocution and nonfatal effects of electric current    Fibromyalgia    Rectal cancer (HCC)    Stroke [facility])      Social History   Tobacco Use  Smoking status: Never    Passive exposure: Never   Smokeless tobacco: Never  Vaping Use   Vaping status: Never Used  Substance Use Topics   Alcohol use: Not Currently   Drug use: No    Mr.Zarrella reports that he has never smoked. He has never been exposed to tobacco smoke. He has never used smokeless tobacco. He reports that he does not currently use alcohol. He reports that he does not use drugs.  Tobacco Cessation: Counseling given: Not Answered Never smoker   Past surgical hx, Family hx, Social hx all reviewed.  Current Outpatient Medications on File Prior to Visit  Medication Sig   albuterol  (PROVENTIL ) (2.5 MG/3ML) 0.083% nebulizer solution Take 2.5 mg by  nebulization every 6 (six) hours as needed for wheezing or shortness of breath.    albuterol  (VENTOLIN  HFA) 108 (90 Base) MCG/ACT inhaler Inhale 1-2 puffs into the lungs every 6 (six) hours as needed for wheezing or shortness of breath.   aspirin  81 MG chewable tablet Chew 1 tablet (81 mg total) by mouth daily.   atorvastatin  (LIPITOR) 80 MG tablet Take 1 tablet (80 mg total) by mouth daily.   clopidogrel  (PLAVIX ) 75 MG tablet Take 1 tablet (75 mg total) by mouth daily.   cyclobenzaprine (FLEXERIL) 10 MG tablet Take 10 mg by mouth every 8 (eight) hours as needed for muscle spasms.   furosemide  (LASIX ) 20 MG tablet Take 1 tablet (20 mg total) by mouth daily.   methadone  (DOLOPHINE ) 10 MG tablet Take 1-2 tablets (10-20 mg total) by mouth every 12 (twelve) hours. (Patient taking differently: Take 10 mg by mouth in the morning, at noon, in the evening, and at bedtime.)   methylphenidate (RITALIN) 20 MG tablet Take 20 mg by mouth 3 (three) times daily.   metoprolol  succinate (TOPROL -XL) 25 MG 24 hr tablet Take 1 tablet (25 mg total) by mouth at bedtime.   Multiple Vitamin (MULTIVITAMIN) capsule Take 1 capsule by mouth daily.   nitroGLYCERIN  (NITROSTAT ) 0.4 MG SL tablet Place 1 tablet (0.4 mg total) under the tongue every 5 (five) minutes x 3 doses as needed for chest pain.   polyethylene glycol (MIRALAX) 17 g packet Take 17 g by mouth daily.   sacubitril-valsartan (ENTRESTO ) 24-26 MG Take 1 tablet by mouth 2 (two) times daily.   spironolactone  (ALDACTONE ) 25 MG tablet Take 0.5 tablets (12.5 mg total) by mouth daily.   SUMAtriptan  (IMITREX ) 100 MG tablet Take 100 mg by mouth every 2 (two) hours as needed for migraine.   tamsulosin (FLOMAX) 0.4 MG CAPS capsule Take 0.4-0.8 mg by mouth See admin instructions. Take 0.4mg  (1 capsule) by mouth daily for 2 weeks, then increase to 0.8mg  (2 capsules) daily,   traZODone  (DESYREL ) 150 MG tablet TAKE 1 TABLET BY MOUTH DAILY (Patient taking differently: Take 75 mg by  mouth at bedtime as needed for sleep.)   VITAMIN D -VITAMIN K PO Take 1 capsule by mouth daily. 10,000IU/ 100 mcg   Current Facility-Administered Medications on File Prior to Visit  Medication   clindamycin  (CLEOCIN ) 900 mg in dextrose  5 % 50 mL IVPB   And   gentamicin  (GARAMYCIN ) 5 mg/kg in dextrose  5 % 50 mL IVPB     Allergies  Allergen Reactions   Latex Other (See Comments)    Skin irritation    Review Of Systems:  Constitutional:   No  weight loss, night sweats,  Fevers, chills, fatigue, or  lassitude.  HEENT:   No headaches,  Difficulty swallowing,  Tooth/dental problems, or  Sore throat,                No sneezing, itching, ear ache, nasal congestion, post nasal drip,   CV:  No chest pain,  Orthopnea, PND, swelling in lower extremities, anasarca, dizziness, palpitations, syncope.   GI  No heartburn, indigestion, abdominal pain, nausea, vomiting, diarrhea, change in bowel habits, loss of appetite, bloody stools.   Resp: + shortness of breath with exertion olessat rest.  No excess mucus, no productive cough,  + non-productive cough,  No coughing up of blood.  No change in color of mucus.  No wheezing.  No chest wall deformity  Skin: no rash or lesions.  GU: no dysuria, change in color of urine, no urgency or frequency.  No flank pain, no hematuria   MS:  No joint pain or swelling.  No decreased range of motion.  + back pain.  Psych:  No change in mood or affect. No depression or anxiety.  No memory loss.Appropriately concerned.   Vital Signs BP 121/74 (BP Location: Left Arm, Patient Position: Sitting, Cuff Size: Normal)   Pulse 73   Ht 5' 9 (1.753 m)   Wt 219 lb 12.8 oz (99.7 kg)   SpO2 97%   BMI 32.46 kg/m    Physical Exam:  General- No distress,  A&Ox3, pleasant ENT: No sinus tenderness, TM clear, pale nasal mucosa, no oral exudate,no post nasal drip, no LAN Cardiac: S1, S2, regular rate and rhythm, no murmur Chest: No wheeze/ rales/ dullness; no accessory  muscle use, no nasal flaring, no sternal retractions, slightly diminished per bases Abd.: Soft Non-tender, ND, BS +, Body mass index is 32.46 kg/m.  Ext: No clubbing cyanosis, edema, no obvious deformities Neuro:  normal strength, MAE x 4, A&O x 3, appropriate Skin: No rashes, warm and dry, no obvious skin lesions  Psych: normal mood and behavior   Assessment/Plan Multiple new spiculated bilateral pulmonary nodules, consistent with pulmonary metastases. History of rectal cancer 2021. Never smoker Plan We have discussed that the best option for determining what the lung nodules are found on CT scan is to do a bronchoscopy with biopsies. I have placed an order for a bronchoscopy with biopsies.  We have discussed the procedure in detail.  We have reviewed the risks and benefits of the procedure. These include bleeding, infection, puncture of the lung, and adverse reaction to anesthesia. You have agreed to proceed with biopsy to evaluate the lung nodule of concern Your procedure will be done by Dr. Racheal Buddle. You will receive a letter today with date time and information pertaining to the procedure. We are calling a cardiologist to get clearance for the procedure. You will need to hold Plavix  x 5 days prior to the procedure You will need to hold your aspirin  1 to 2 days prior to the procedure this will be based on what your cardiologist would prefer. You will need someone to drive you to the procedure, stay with you during the procedure, and stay with you after the procedure. You will also need someone to stay with you for 24 hours after anesthesia to ensure you have cleared and are doing well. You will follow-up with me 1 week after the procedure to review the results and to ensure you are doing well. Call if you need us  prior to the procedure or if you have any questions at all. Please contact office for sooner follow up if symptoms do not improve or worsen or  seek emergency care   I  have ordered a PET scan stage and to see if there are any additional procedures needed .  Call if you need us  .   Please contact office for sooner follow up if symptoms do not improve or worsen or seek emergency care      I spent 35 minutes dedicated to the care of this patient on the date of this encounter to include pre-visit review of records, face-to-face time with the patient discussing conditions above, post visit ordering of testing, clinical documentation with the electronic health record, making appropriate referrals as documented, and communicating necessary information to the patient's healthcare team.   Raejean Bullock, NP 02/16/2024  10:19 AM

## 2024-02-16 NOTE — Patient Instructions (Signed)
 It is nice to meet you today. We have discussed that the best option for determining what the lung nodules are found on CT scan is to do a bronchoscopy with biopsies. I have placed an order for a bronchoscopy with biopsies.  We have discussed the procedure in detail.  We have reviewed the risks and benefits of the procedure. These include bleeding, infection, puncture of the lung, and adverse reaction to anesthesia. You have agreed to proceed with biopsy to evaluate the lung nodule of concern Your procedure will be done by Dr. Racheal Buddle. You will receive a letter today with date time and information pertaining to the procedure. We are calling a cardiologist to get clearance for the procedure. You will need to hold Plavix  x 5 days prior to the procedure You will need to hold your aspirin  1 to 2 days prior to the procedure this will be based on what your cardiologist would prefer. You will need someone to drive you to the procedure, stay with you during the procedure, and stay with you after the procedure. You will also need someone to stay with you for 24 hours after anesthesia to ensure you have cleared and are doing well. You will follow-up with me 1 week after the procedure to review the results and to ensure you are doing well. Call if you need us  prior to the procedure or if you have any questions at all. Please contact office for sooner follow up if symptoms do not improve or worsen or seek emergency care   I have ordered a PET scan stage and to see if there are any additional procedures needed .  Call if you need us  .   Please contact office for sooner follow up if symptoms do not improve or worsen or seek emergency care

## 2024-02-23 ENCOUNTER — Ambulatory Visit
Admission: RE | Admit: 2024-02-23 | Discharge: 2024-02-23 | Disposition: A | Discharge status: Home / Self Care | Source: Ambulatory Visit | Attending: Acute Care | Admitting: Acute Care

## 2024-02-23 DIAGNOSIS — R918 Other nonspecific abnormal finding of lung field: Secondary | ICD-10-CM

## 2024-02-23 DIAGNOSIS — Z85048 Personal history of other malignant neoplasm of rectum, rectosigmoid junction, and anus: Secondary | ICD-10-CM | POA: Insufficient documentation

## 2024-02-23 LAB — GLUCOSE, CAPILLARY: Glucose-Capillary: 127 mg/dL — ABNORMAL HIGH (ref 70–99)

## 2024-02-23 MED ORDER — FLUDEOXYGLUCOSE F - 18 (FDG) INJECTION
12.1000 | Freq: Once | INTRAVENOUS | Status: AC | PRN
  Administered 2024-02-23: 12.1 via INTRAVENOUS

## 2024-02-26 ENCOUNTER — Encounter (HOSPITAL_COMMUNITY): Payer: Self-pay | Admitting: Emergency Medicine

## 2024-02-26 ENCOUNTER — Other Ambulatory Visit: Payer: Self-pay

## 2024-02-26 NOTE — Anesthesia Preprocedure Evaluation (Signed)
 Anesthesia Evaluation  Patient identified by MRN, date of birth, ID band Patient awake    Reviewed: Allergy & Precautions, NPO status , Patient's Chart, lab work & pertinent test results, reviewed documented beta blocker date and time   History of Anesthesia Complications Negative for: history of anesthetic complications  Airway Mallampati: II  TM Distance: >3 FB     Dental no notable dental hx.    Pulmonary asthma , pneumonia   breath sounds clear to auscultation       Cardiovascular + CAD and + Past MI (fairly recent - 11/2023)   Rhythm:Regular Rate:Normal  IMPRESSIONS     1. Left ventricular ejection fraction, by estimation, is 40 to 45%. The  left ventricle has mildly decreased function. The left ventricle  demonstrates regional wall motion abnormalities (see scoring  diagram/findings for description). Left ventricular  diastolic parameters were normal. LAD and apical hypokinesis without  evidence of LV thrombus.   2. Right ventricular systolic function is normal. The right ventricular  size is normal.   3. The mitral valve is normal in structure. Trivial mitral valve  regurgitation. No evidence of mitral stenosis.   4. The aortic valve is tricuspid. Aortic valve regurgitation is not  visualized. Aortic valve sclerosis is present, with no evidence of aortic  valve stenosis.   5. Aortic dilatation noted. 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     Neuro/Psych  Headaches PSYCHIATRIC DISORDERS       Neuromuscular disease CVA    GI/Hepatic ,GERD  ,,(+)     substance abuse (on methadone  for chronic pain)    Endo/Other   Hyperthyroidism   Renal/GU      Musculoskeletal  (+)  Fibromyalgia -, narcotic dependent  Abdominal   Peds  Hematology   Anesthesia Other Findings   Reproductive/Obstetrics                             Anesthesia Physical Anesthesia  Plan  ASA: 3  Anesthesia Plan: General   Post-op Pain Management:    Induction: Intravenous  PONV Risk Score and Plan: 2 and Ondansetron  and Dexamethasone  Airway Management Planned: Oral ETT  Additional Equipment:   Intra-op Plan:   Post-operative Plan: Extubation in OR  Informed Consent: I have reviewed the patients History and Physical, chart, labs and discussed the procedure including the risks, benefits and alternatives for the proposed anesthesia with the patient or authorized representative who has indicated his/her understanding and acceptance.     Dental advisory given  Plan Discussed with: CRNA  Anesthesia Plan Comments: (See PAT note from 6/23)        Anesthesia Quick Evaluation

## 2024-02-26 NOTE — Progress Notes (Signed)
 Case: [id number] Date/Time: 02/27/24 0730   Procedure: VIDEO BRONCHOSCOPY WITH ENDOBRONCHIAL NAVIGATION (Bilateral)   Anesthesia type: General   Diagnosis: Abnormal chest x-ray with multiple lung nodules [R91.8]   Pre-op diagnosis: bilateral lung nodules   [facility] / [facility]   Surgeons: Shelah Lamar RAMAN, MD       DISCUSSION: Derrick Johnson is a 68 yo male who is a SDW prior to surgery above. 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  Patient has hx of rectal cancer treated with chemo/XRT in 2021. He was supposed to have surgical resection at that time but was lost to follow up. Last seen by Oncology on 02/12/24. When he was restaged he was found to have findings c/f metastatic pulmonary nodules and now scheduled for procedure above.  Patient presented to the ED on 11/27/23 with chest pain. Diagnosed with anterior STEMI. Went to cath lab and treated with 2 overlapping drug-eluting stents with complex PCI requiring kissing balloon inflation to LAD and diagonal. Had cardiogenic shock in cath lab but was stabilized. Echo showed LVEF 40-45%. Started on DAPT and GDMT. Pt followed up in clinic and was last seen on 12/25/23. He reported having some episodes of chest pain which was different from when he presented to the ED. Pt noted to be euvolemic. Advised continue medications. Advised to update Echo in 3 months (~due 03/2024). Cardiac clearance provided in 02/15/24 telephone encounter:  Chart reviewed as part of pre-operative protocol coverage. He was seen by Jackee Alberts NP on 4/21 in clinic. Discussed with Jackee, Mr. Wrightson was seen for follow-up on 12/25/23 and reported no acute changes in his health and was able to  complete greater than 4 METS of activity without any difficulty. Therefore he would be an acceptable risk to proceed with scheduled procedure without any additional ischemic evaluation.  Patient is on DAPT with aspirin  and plavix . Per Dr. Wendel, he can hold his plavix  5 days and best to stay on ASA 81 during the procedure since we are not very far out from his March PCI   VS: Obtain DOS  PROVIDERS: Loring Tanda Mae, MD   LABS: Labs from 5/27 WNL   IMAGES:  CT Chest/Abd/Pelvis 02/08/24:  IMPRESSION: 1. Multiple new spiculated bilateral pulmonary nodules, consistent with pulmonary metastases. 2. Previously seen circumferential superior rectal mass is no longer distinctly visible, presumably treated in the interval. 3. No evidence of lymphadenopathy or metastatic disease in the abdomen or pelvis. 4. Coronary artery disease.   EKG 12/08/23:  Normal sinus rhythm, rate 94 Left axis deviation Septal infarct (cited on or before 28-Nov-2023) Abnormal ECG When compared with ECG of 29-Nov-2023 07:23, QRS axis Shifted left Serial changes of Septal infarct Present  CV: Echo 11/28/23:  IMPRESSIONS    1. Left ventricular ejection fraction, by estimation, is 40 to 45%. The left ventricle has mildly decreased function. The left ventricle demonstrates regional wall motion abnormalities (see scoring diagram/findings for description). Left ventricular diastolic parameters were normal. LAD and apical hypokinesis without evidence of LV thrombus.  2. Right ventricular systolic function is normal. The right ventricular size is normal.  3. The mitral valve is normal in structure. Trivial mitral valve regurgitation. No evidence of mitral stenosis.  4. The aortic  valve is tricuspid. Aortic valve regurgitation is not visualized. Aortic valve sclerosis is present, with no evidence of aortic valve stenosis.  5. Aortic dilatation noted. There is mild dilatation of the aortic  arch, measuring 34 mm.  Comparison(s): No prior Echocardiogram.  LHC 11/27/23:     Mid RCA lesion is 30% stenosed.   Mid LAD lesion is 100% stenosed.   A stent was successfully placed.   Post intervention, there is a 0% residual stenosis.   1.  100% mid LAD occlusion treated with 2 overlapping drug-eluting stents with complex PCI requiring kissing balloon inflation to preserve TIMI-3 flow in the jailed diagonal. 2.  Transient cardiogenic shock with a mildly elevated lactate of 2.1 treated with transient norepinephrine  which was weaned at the conclusion of the procedure.  Repeat lactate was within normal limits. 3.  Ventriculography demonstrating EF of 35 to 40% with severe anterolateral hypokinesis. 4.  LVEDP of 12 mmHg.   Recommendation: Continued dual antiplatelet therapy for 1 year and goal-directed medical therapy for cardiomyopathy.  Obtain echocardiogram.  The patient does have a history of stroke so conversion from ticagrelor  is required Plavix  therapy should be pursued.       Past Medical History:  Diagnosis Date   Asthma    Back contusion    CAD (coronary artery disease) 11/27/2023   s/p PCI   Chronic migraine    Chronic pain    Dissection of cerebral artery (HCC)    Electrocution and nonfatal effects of electric current    Fibromyalgia    Rectal cancer (HCC)    STEMI (ST elevation myocardial infarction) (HCC) 11/27/2023   Stroke [facility])     Past Surgical History:  Procedure Laterality Date   CORONARY/GRAFT ACUTE MI REVASCULARIZATION N/A 11/27/2023   Procedure: Coronary/Graft Acute MI Revascularization;  Surgeon: Thukkani, Arun K, MD;  [facility];  Service: Cardiovascular;  Laterality: N/A;   CRYO INTERCOSTAL NERVE BLOCK     LEFT HEART CATH AND CORONARY ANGIOGRAPHY N/A 11/27/2023   Procedure: LEFT HEART CATH AND CORONARY ANGIOGRAPHY;  Surgeon: Wendel Lurena POUR, MD;  [facility]: [facility];  Service: Cardiovascular;  Laterality: N/A;    maxofacial surgery     ROTATOR CUFF REPAIR Left    SPINAL CORD STIMULATOR IMPLANT     SPINE SURGERY     TMJ ARTHROSCOPY      MEDICATIONS: No current facility-administered medications for this encounter.    albuterol  (PROVENTIL ) (2.5 MG/3ML) 0.083% nebulizer solution   albuterol  (VENTOLIN  HFA) 108 (90 Base) MCG/ACT inhaler   aspirin  81 MG chewable tablet   atorvastatin  (LIPITOR) 80 MG tablet   clopidogrel  (PLAVIX ) 75 MG tablet   cyclobenzaprine (FLEXERIL) 10 MG tablet   furosemide  (LASIX ) 20 MG tablet   methadone  (DOLOPHINE ) 10 MG tablet   methylphenidate (RITALIN) 20 MG tablet   metoprolol  succinate (TOPROL -XL) 25 MG 24 hr tablet   Multiple Vitamin (MULTIVITAMIN) capsule   nitroGLYCERIN  (NITROSTAT ) 0.4 MG SL tablet   polyethylene glycol (MIRALAX) 17 g packet   sacubitril-valsartan (ENTRESTO ) 24-26 MG   spironolactone  (ALDACTONE ) 25 MG tablet   SUMAtriptan  (IMITREX ) 100 MG tablet   tamsulosin (FLOMAX) 0.4 MG CAPS capsule   traZODone  (DESYREL ) 150 MG tablet   VITAMIN D -VITAMIN K PO    clindamycin  (CLEOCIN ) 900 mg in dextrose  5 % 50 mL IVPB   And   gentamicin  (GARAMYCIN ) 5 mg/kg in dextrose  5 % 50 mL IVPB    Burnard CHRISTELLA Senna, PA-C [facility] Surgical Short Stay/Anesthesiology  [facility] Phone [phone] 02/26/2024 10:36 AM

## 2024-02-26 NOTE — Progress Notes (Signed)
 PCP - Dr Orlena Eke Cardiologist - Dr Jenkins Red (cleared for surgery 02/16/24) Oncology - Dr Chinita Pasam  CT Chest x-ray - 02/08/24 EKG - 12/08/23 Stress Test - n/a ECHO - 12/25/23 Cardiac Cath - 11/27/23  ICD Pacemaker/Loop - n/a  Sleep Study -  n/a CPAP - none  Diabetes - n/a  Aspirin  - Continue ASA during the procedure per Dr Red.    Blood Thinner Instructions:  Hold Plavix  for 5 days prior to procedure.  Last dose will be on 02/21/24.  NPO   Anesthesia review: Yes  STOP now taking any Aspirin  (unless otherwise instructed by your surgeon), Aleve, Naproxen, Ibuprofen , Motrin , Advil , Goody's, BC's, all herbal medications, fish oil, and all vitamins.   Coronavirus Screening Do you have any of the following symptoms:  Cough yes/no: No Fever (>100.46F)  yes/no: No Runny nose yes/no: No Sore throat yes/no: No Difficulty breathing/shortness of breath  yes/no: No  Have you traveled in the last 14 days and where? yes/no: No  Patient verbalized understanding of instructions that were given via phone.

## 2024-02-27 ENCOUNTER — Ambulatory Visit (HOSPITAL_COMMUNITY)
Admission: RE | Admit: 2024-02-27 | Discharge: 2024-02-27 | Disposition: A | Discharge status: Home / Self Care | Attending: Emergency Medicine | Admitting: Emergency Medicine

## 2024-02-27 ENCOUNTER — Ambulatory Visit (HOSPITAL_COMMUNITY)

## 2024-02-27 ENCOUNTER — Ambulatory Visit (HOSPITAL_COMMUNITY): Payer: Self-pay | Admitting: Medical

## 2024-02-27 ENCOUNTER — Ambulatory Visit (HOSPITAL_BASED_OUTPATIENT_CLINIC_OR_DEPARTMENT_OTHER): Payer: Self-pay | Admitting: Medical

## 2024-02-27 ENCOUNTER — Encounter (HOSPITAL_COMMUNITY): Payer: Self-pay | Admitting: Emergency Medicine

## 2024-02-27 ENCOUNTER — Other Ambulatory Visit: Payer: Self-pay

## 2024-02-27 ENCOUNTER — Encounter (HOSPITAL_COMMUNITY)
Admission: RE | Disposition: A | Discharge status: Home / Self Care | Payer: Self-pay | Source: Home / Self Care | Attending: Emergency Medicine

## 2024-02-27 DIAGNOSIS — G43909 Migraine, unspecified, not intractable, without status migrainosus: Secondary | ICD-10-CM | POA: Insufficient documentation

## 2024-02-27 DIAGNOSIS — C3432 Malignant neoplasm of lower lobe, left bronchus or lung: Secondary | ICD-10-CM | POA: Insufficient documentation

## 2024-02-27 DIAGNOSIS — K219 Gastro-esophageal reflux disease without esophagitis: Secondary | ICD-10-CM | POA: Diagnosis not present

## 2024-02-27 DIAGNOSIS — I2510 Atherosclerotic heart disease of native coronary artery without angina pectoris: Secondary | ICD-10-CM | POA: Diagnosis not present

## 2024-02-27 DIAGNOSIS — Z9221 Personal history of antineoplastic chemotherapy: Secondary | ICD-10-CM | POA: Diagnosis not present

## 2024-02-27 DIAGNOSIS — R918 Other nonspecific abnormal finding of lung field: Secondary | ICD-10-CM

## 2024-02-27 DIAGNOSIS — G8929 Other chronic pain: Secondary | ICD-10-CM | POA: Diagnosis not present

## 2024-02-27 DIAGNOSIS — M549 Dorsalgia, unspecified: Secondary | ICD-10-CM | POA: Diagnosis not present

## 2024-02-27 DIAGNOSIS — J45909 Unspecified asthma, uncomplicated: Secondary | ICD-10-CM | POA: Insufficient documentation

## 2024-02-27 DIAGNOSIS — I252 Old myocardial infarction: Secondary | ICD-10-CM | POA: Diagnosis not present

## 2024-02-27 DIAGNOSIS — I350 Nonrheumatic aortic (valve) stenosis: Secondary | ICD-10-CM | POA: Insufficient documentation

## 2024-02-27 DIAGNOSIS — Z8673 Personal history of transient ischemic attack (TIA), and cerebral infarction without residual deficits: Secondary | ICD-10-CM | POA: Insufficient documentation

## 2024-02-27 DIAGNOSIS — Z79899 Other long term (current) drug therapy: Secondary | ICD-10-CM | POA: Insufficient documentation

## 2024-02-27 DIAGNOSIS — Z7902 Long term (current) use of antithrombotics/antiplatelets: Secondary | ICD-10-CM | POA: Insufficient documentation

## 2024-02-27 DIAGNOSIS — Z955 Presence of coronary angioplasty implant and graft: Secondary | ICD-10-CM | POA: Insufficient documentation

## 2024-02-27 DIAGNOSIS — M797 Fibromyalgia: Secondary | ICD-10-CM | POA: Insufficient documentation

## 2024-02-27 DIAGNOSIS — Z923 Personal history of irradiation: Secondary | ICD-10-CM | POA: Insufficient documentation

## 2024-02-27 DIAGNOSIS — G950 Syringomyelia and syringobulbia: Secondary | ICD-10-CM

## 2024-02-27 DIAGNOSIS — E039 Hypothyroidism, unspecified: Secondary | ICD-10-CM | POA: Diagnosis not present

## 2024-02-27 DIAGNOSIS — Z85048 Personal history of other malignant neoplasm of rectum, rectosigmoid junction, and anus: Secondary | ICD-10-CM | POA: Diagnosis not present

## 2024-02-27 DIAGNOSIS — Z7982 Long term (current) use of aspirin: Secondary | ICD-10-CM | POA: Diagnosis not present

## 2024-02-27 DIAGNOSIS — E0590 Thyrotoxicosis, unspecified without thyrotoxic crisis or storm: Secondary | ICD-10-CM | POA: Insufficient documentation

## 2024-02-27 DIAGNOSIS — G894 Chronic pain syndrome: Secondary | ICD-10-CM

## 2024-02-27 DIAGNOSIS — Z5181 Encounter for therapeutic drug level monitoring: Secondary | ICD-10-CM

## 2024-02-27 HISTORY — DX: Ischemic cardiomyopathy: I25.5

## 2024-02-27 HISTORY — PX: VIDEO BRONCHOSCOPY WITH ENDOBRONCHIAL NAVIGATION: SHX6175

## 2024-02-27 HISTORY — DX: Gastro-esophageal reflux disease without esophagitis: K21.9

## 2024-02-27 HISTORY — DX: Pneumonia, unspecified organism: J18.9

## 2024-02-27 SURGERY — VIDEO BRONCHOSCOPY WITH ENDOBRONCHIAL NAVIGATION
Anesthesia: General | Laterality: Bilateral

## 2024-02-27 MED ORDER — PHENYLEPHRINE 80 MCG/ML (10ML) SYRINGE FOR IV PUSH (FOR BLOOD PRESSURE SUPPORT)
PREFILLED_SYRINGE | INTRAVENOUS | Status: DC | PRN
  Administered 2024-02-27: 80 ug via INTRAVENOUS
  Administered 2024-02-27 (×2): 160 ug via INTRAVENOUS

## 2024-02-27 MED ORDER — FENTANYL CITRATE (PF) 250 MCG/5ML IJ SOLN
INTRAMUSCULAR | Status: DC | PRN
Start: 2024-02-27 — End: 2024-02-27
  Administered 2024-02-27: 100 ug via INTRAVENOUS

## 2024-02-27 MED ORDER — ONDANSETRON HCL 4 MG/2ML IJ SOLN
INTRAMUSCULAR | Status: DC | PRN
Start: 2024-02-27 — End: 2024-02-27
  Administered 2024-02-27: 4 mg via INTRAVENOUS

## 2024-02-27 MED ORDER — LIDOCAINE 2% (20 MG/ML) 5 ML SYRINGE
INTRAMUSCULAR | Status: DC | PRN
  Administered 2024-02-27: 100 mg via INTRAVENOUS

## 2024-02-27 MED ORDER — PROPOFOL 10 MG/ML IV BOLUS
INTRAVENOUS | Status: DC | PRN
  Administered 2024-02-27: 150 mg via INTRAVENOUS

## 2024-02-27 MED ORDER — CLOPIDOGREL BISULFATE 75 MG PO TABS: 75.0000 mg | ORAL_TABLET | Freq: Every day | ORAL | Status: DC

## 2024-02-27 MED ORDER — METHADONE HCL 10 MG PO TABS: 10.0000 mg | ORAL_TABLET | Freq: Four times a day (QID) | ORAL | Status: DC

## 2024-02-27 MED ORDER — CHLORHEXIDINE GLUCONATE 0.12 % MT SOLN
15.0000 mL | Freq: Once | OROMUCOSAL | Status: AC
  Administered 2024-02-27: 15 mL via OROMUCOSAL
  Filled 2024-02-27: qty 15

## 2024-02-27 MED ORDER — LACTATED RINGERS IV SOLN: INTRAVENOUS | Status: DC

## 2024-02-27 MED ORDER — ROCURONIUM BROMIDE 10 MG/ML (PF) SYRINGE
PREFILLED_SYRINGE | INTRAVENOUS | Status: DC | PRN
  Administered 2024-02-27: 60 mg via INTRAVENOUS

## 2024-02-27 MED ORDER — TRAZODONE HCL 150 MG PO TABS: 75.0000 mg | ORAL_TABLET | Freq: Every evening | ORAL | Status: AC | PRN

## 2024-02-27 MED ORDER — PHENYLEPHRINE HCL-NACL 20-0.9 MG/250ML-% IV SOLN
INTRAVENOUS | Status: DC | PRN
  Administered 2024-02-27: 30 ug/min via INTRAVENOUS

## 2024-02-27 MED ORDER — FENTANYL CITRATE (PF) 100 MCG/2ML IJ SOLN
INTRAMUSCULAR | Status: AC
  Filled 2024-02-27: qty 2

## 2024-02-27 MED ORDER — DEXAMETHASONE SODIUM PHOSPHATE 10 MG/ML IJ SOLN
INTRAMUSCULAR | Status: DC | PRN
  Administered 2024-02-27: 10 mg via INTRAVENOUS

## 2024-02-27 MED ORDER — EPHEDRINE SULFATE-NACL 50-0.9 MG/10ML-% IV SOSY
PREFILLED_SYRINGE | INTRAVENOUS | Status: DC | PRN
  Administered 2024-02-27 (×2): 10 mg via INTRAVENOUS

## 2024-02-27 MED ORDER — GLYCOPYRROLATE 0.2 MG/ML IJ SOLN
INTRAMUSCULAR | Status: DC | PRN
  Administered 2024-02-27: .2 mg via INTRAVENOUS

## 2024-02-27 MED ORDER — SUGAMMADEX SODIUM 200 MG/2ML IV SOLN
INTRAVENOUS | Status: DC | PRN
  Administered 2024-02-27: 200 mg via INTRAVENOUS

## 2024-02-27 SURGICAL SUPPLY — 36 items
ADAPTER BRONCHOSCOPE OLYMPUS (ADAPTER) ×1 IMPLANT
ADAPTER VALVE BIOPSY EBUS (MISCELLANEOUS) IMPLANT
BAG COUNTER SPONGE SURGICOUNT (BAG) ×1 IMPLANT
BRUSH CYTOL CELLEBRITY 1.5X140 (MISCELLANEOUS) ×1 IMPLANT
BRUSH SUPERTRAX BIOPSY (INSTRUMENTS) IMPLANT
BRUSH SUPERTRAX NDL-TIP CYTO (INSTRUMENTS) ×1 IMPLANT
CANISTER SUCTION 3000ML PPV (SUCTIONS) ×1 IMPLANT
CNTNR URN SCR LID CUP LEK RST (MISCELLANEOUS) ×1 IMPLANT
COVER BACK TABLE 60X90IN (DRAPES) ×1 IMPLANT
FILTER STRAW FLUID ASPIR (MISCELLANEOUS) IMPLANT
FORCEPS BIOP 1.5 SINGLE USE (MISCELLANEOUS) ×1 IMPLANT
FORCEPS BIOP SUPERTRX PREMAR (INSTRUMENTS) ×1 IMPLANT
GAUZE SPONGE 4X4 12PLY STRL (GAUZE/BANDAGES/DRESSINGS) ×1 IMPLANT
GLOVE BIO SURGEON STRL SZ7.5 (GLOVE) ×2 IMPLANT
GOWN STRL REUS W/ TWL LRG LVL3 (GOWN DISPOSABLE) ×2 IMPLANT
KIT CLEAN ENDO COMPLIANCE (KITS) ×1 IMPLANT
KIT LOCATABLE GUIDE (CANNULA) IMPLANT
KIT MARKER FIDUCIAL DELIVERY (KITS) IMPLANT
KIT TURNOVER KIT B (KITS) ×1 IMPLANT
MARKER SKIN DUAL TIP RULER LAB (MISCELLANEOUS) ×1 IMPLANT
NDL SUPERTRX PREMARK BIOPSY (NEEDLE) ×1 IMPLANT
NEEDLE SUPERTRX PREMARK BIOPSY (NEEDLE) ×1 IMPLANT
NS IRRIG 1000ML POUR BTL (IV SOLUTION) ×1 IMPLANT
OIL SILICONE PENTAX (PARTS (SERVICE/REPAIRS)) ×1 IMPLANT
PAD ARMBOARD POSITIONER FOAM (MISCELLANEOUS) ×2 IMPLANT
PATCHES PATIENT (LABEL) ×3 IMPLANT
SYR 20ML ECCENTRIC (SYRINGE) ×1 IMPLANT
SYR 20ML LL LF (SYRINGE) ×1 IMPLANT
SYR 50ML SLIP (SYRINGE) ×1 IMPLANT
TOWEL GREEN STERILE FF (TOWEL DISPOSABLE) ×1 IMPLANT
TRAP SPECIMEN MUCUS 40CC (MISCELLANEOUS) IMPLANT
TUBE CONNECTING 20X1/4 (TUBING) ×1 IMPLANT
UNDERPAD 30X36 HEAVY ABSORB (UNDERPADS AND DIAPERS) ×1 IMPLANT
VALVE BIOPSY SINGLE USE (MISCELLANEOUS) ×1 IMPLANT
VALVE SUCTION BRONCHIO DISP (MISCELLANEOUS) ×1 IMPLANT
WATER STERILE IRR 1000ML POUR (IV SOLUTION) ×1 IMPLANT

## 2024-02-27 NOTE — Anesthesia Postprocedure Evaluation (Signed)
 Anesthesia Post Note  Patient: Derrick Johnson  Procedure(s) Performed: VIDEO BRONCHOSCOPY WITH ENDOBRONCHIAL NAVIGATION (Bilateral)     Patient location during evaluation: PACU Anesthesia Type: General Level of consciousness: awake and alert Pain management: pain level controlled Vital Signs Assessment: post-procedure vital signs reviewed and stable Respiratory status: spontaneous breathing, nonlabored ventilation, respiratory function stable and patient connected to nasal cannula oxygen Cardiovascular status: blood pressure returned to baseline and stable Postop Assessment: no apparent nausea or vomiting Anesthetic complications: no   No notable events documented.  Last Vitals:  Vitals:   02/27/24 0915 02/27/24 0930  BP: 109/67 103/79  Pulse: 68 64  Resp: 13 12  Temp:  37.1 C  SpO2: 96% 98%    Last Pain:  Vitals:   02/27/24 0915  TempSrc:   PainSc: 0-No pain                 Derrick [name]

## 2024-02-27 NOTE — Interval H&P Note (Signed)
 History and Physical Interval Note:  02/27/2024 7:13 AM  Derrick Johnson  has presented today for surgery, with the diagnosis of bilateral lung nodules.  The various methods of treatment have been discussed with the patient and family. After consideration of risks, benefits and other options for treatment, the patient has consented to  Procedure(s): VIDEO BRONCHOSCOPY WITH ENDOBRONCHIAL NAVIGATION (Bilateral) as a surgical intervention.  The patient's history has been reviewed, patient examined, no change in status, stable for surgery.  I have reviewed the patient's chart and labs.  Questions were answered to the patient's satisfaction.     Lamar GORMAN Chris

## 2024-02-27 NOTE — Op Note (Signed)
 Procedure Note  Patient: Derrick Johnson  [employer] Cios mobile C-arm was utilized to identify and biopsy a left upper lobe nodule in the right upper lobe nodule.  Needle-in-lesion was confirmed at both locations using real-time Cios imaging, and images were uploaded to PACS.   Lamar Chris, MD, PhD 02/27/2024, 8:47 AM [facility] [phone] or if no answer before 7:00PM call [phone] For any issues after 7:00PM please call eLink [phone]

## 2024-02-27 NOTE — Anesthesia Procedure Notes (Signed)
 Procedure Name: Intubation Date/Time: 02/27/2024 7:39 AM  Performed by: Elby Raelene SAUNDERS, CRNAPre-anesthesia Checklist: Patient identified, Emergency Drugs available, Suction available and Patient being monitored Patient Re-evaluated:Patient Re-evaluated prior to induction Oxygen Delivery Method: Circle System Utilized Preoxygenation: Pre-oxygenation with 100% oxygen Induction Type: IV induction Ventilation: Mask ventilation without difficulty Laryngoscope Size: Miller and 3 Grade View: Grade I Tube type: Oral Tube size: 8.5 mm Number of attempts: 1 Airway Equipment and Method: Stylet and Oral airway Placement Confirmation: ETT inserted through vocal cords under direct vision, positive ETCO2 and breath sounds checked- equal and bilateral Secured at: 23 cm Tube secured with: Tape Dental Injury: Teeth and Oropharynx as per pre-operative assessment

## 2024-02-27 NOTE — Transfer of Care (Signed)
 Immediate Anesthesia Transfer of Care Note  Patient: Derrick Johnson  Procedure(s) Performed: VIDEO BRONCHOSCOPY WITH ENDOBRONCHIAL NAVIGATION (Bilateral)  Patient Location: PACU  Anesthesia Type:General  Level of Consciousness: awake, alert , and oriented  Airway & Oxygen Therapy: Patient Spontanous Breathing and Patient connected to nasal cannula oxygen  Post-op Assessment: Report given to RN and Post -op Vital signs reviewed and stable  Post vital signs: Reviewed and stable  Last Vitals:  Vitals Value Taken Time  BP 109/67 02/27/24 09:15  Temp 37.1 C 02/27/24 08:54  Pulse 67 02/27/24 09:22  Resp 14 02/27/24 09:22  SpO2 100 % 02/27/24 09:22  Vitals shown include unfiled device data.  Last Pain:  Vitals:   02/27/24 0915  TempSrc:   PainSc: 0-No pain         Complications: No notable events documented.

## 2024-02-27 NOTE — Op Note (Signed)
 Video Bronchoscopy with Robotic Assisted Bronchoscopic Navigation   Date of Operation: 02/27/2024   Pre-op Diagnosis: Bilateral pulmonary nodules  Post-op Diagnosis: Same  Surgeon: Lamar Chris  Assistants: None  Anesthesia: General endotracheal anesthesia  Operation: Flexible video fiberoptic bronchoscopy with robotic assistance and biopsies.  Estimated Blood Loss: Minimal  Complications: None  Indications and History: Derrick Johnson is a 68 y.o. male with history of colorectal cancer.  He was found to have bilateral pulmonary nodules on posttreatment surveillance imaging.  Recommendation made to achieve a tissue diagnosis via robotic assisted navigational bronchoscopy.  The risks, benefits, complications, treatment options and expected outcomes were discussed with the patient.  The possibilities of pneumothorax, pneumonia, reaction to medication, pulmonary aspiration, perforation of a viscus, bleeding, failure to diagnose a condition and creating a complication requiring transfusion or operation were discussed with the patient who freely signed the consent.    Description of Procedure: The patient was seen in the Preoperative Area, was examined and was deemed appropriate to proceed.  The patient was taken to [facility] Endoscopy room 3, identified as Derrick Johnson and the procedure verified as Flexible Video Fiberoptic Bronchoscopy.  A Time Out was held and the above information confirmed.   Prior to the date of the procedure a high-resolution CT scan of the chest was performed. Utilizing ION software program a virtual tracheobronchial tree was generated to allow the creation of distinct navigation pathways to the patient's parenchymal abnormalities. After being taken to the operating room general anesthesia was initiated and the patient  was orally intubated. The video fiberoptic bronchoscope was introduced via the endotracheal tube and a general inspection was performed which showed normal right  and left lung anatomy. Aspiration of the bilateral mainstems was completed to remove any remaining secretions. Robotic catheter inserted into patient's endotracheal tube.   Target #1 left upper lobe nodule: The distinct navigation pathways prepared prior to this procedure were then utilized to navigate to patient's lesion identified on CT scan. The robotic catheter was secured into place and the vision probe was withdrawn.  Lesion location was approximated using fluoroscopy.  Local registration and targeting was performed using [employer] Cios mobile C-arm three-dimensional imaging. Under fluoroscopic guidance transbronchial needle brushings, transbronchial needle biopsies, and transbronchial forceps biopsies were performed to be sent for cytology and pathology.  Needle-in-lesion was confirmed using Cios mobile C-arm.    Target #2 right upper lobe nodule: The distinct navigation pathways prepared prior to this procedure were then utilized to navigate to patient's lesion identified on CT scan. The robotic catheter was secured into place and the vision probe was withdrawn.  Lesion location was approximated using fluoroscopy.  Local registration and targeting was performed using [employer] Cios mobile C-arm three-dimensional imaging. Under fluoroscopic guidance transbronchial needle biopsies and transbronchial forceps biopsies were performed to be sent for cytology and pathology.  Needle-in-lesion was confirmed using Cios mobile C-arm.   At the end of the procedure a general airway inspection was performed and there was no evidence of active bleeding. The bronchoscope was removed.  The patient tolerated the procedure well. There was no significant blood loss and there were no obvious complications. A post-procedural chest x-ray is pending.  Samples Target #1: 1. Transbronchial needle brushings from left upper lobe nodule 2. Transbronchial Wang needle biopsies from left upper lobe  nodule 3. Transbronchial forceps biopsies from left upper lobe nodule  Samples Target #2: 1. Transbronchial Wang needle biopsies from right upper lobe nodule 2. Transbronchial forceps biopsies  from right upper lobe nodule  Plans:  The patient will be discharged from the PACU to home when recovered from anesthesia and after chest x-ray is reviewed. We will review the cytology, pathology and microbiology results with the patient when they become available. Outpatient followup will be with CANDIE Lites, NP and Dr. Shelah.   Lamar Shelah, MD, PhD 02/27/2024, 8:45 AM [facility] [phone] or if no answer before 7:00PM call [phone] For any issues after 7:00PM please call eLink [phone]

## 2024-02-27 NOTE — Discharge Instructions (Addendum)
 Flexible Bronchoscopy, Care After This sheet gives you information about how to care for yourself after your test. Your doctor may also give you more specific instructions. If you have problems or questions, contact your doctor. Follow these instructions at home: Eating and drinking When you are wide awake, your numbness is gone and your cough and gag reflexes have come back, you may: Start eating only soft foods. Slowly drink liquids. Six hours after the test, go back to your normal diet. Driving Do not drive for 24 hours if you were given a medicine to help you relax (sedative). Do not drive or use heavy machinery while taking prescription pain medicine. General instructions Take over-the-counter and prescription medicines only as told by your doctor. Return to your normal activities as told. Ask what activities are safe for you. Do not use any products that have nicotine or tobacco in them. This includes cigarettes and e-cigarettes. If you need help quitting, ask your doctor. Keep all follow-up visits as told by your doctor. This is important. It is very important if you had a tissue sample (biopsy) taken. Get help right away if: You have shortness of breath that gets worse. You get light-headed. You feel like you are going to pass out (faint). You have chest pain. You cough up: More than a little blood. More blood than before. Summary Do not use cigarettes. Do not use e-cigarettes. Seek care in the Emergency Department right away if you have chest pain or shortness of breath. Call or MyChart Message our office for any questions or problems at [phone].  Okay to restart Plavix  on 02/28/2024.   This information is not intended to replace advice given to you by your health care provider. Make sure you discuss any questions you have with your health care provider.

## 2024-02-29 ENCOUNTER — Encounter (HOSPITAL_COMMUNITY): Payer: Self-pay | Admitting: Emergency Medicine

## 2024-03-04 ENCOUNTER — Encounter: Payer: Self-pay | Admitting: Acute Care

## 2024-03-04 ENCOUNTER — Ambulatory Visit (INDEPENDENT_AMBULATORY_CARE_PROVIDER_SITE_OTHER): Admitting: Acute Care

## 2024-03-04 VITALS — BP 129/80 | HR 77 | Ht 69.0 in | Wt 220.8 lb

## 2024-03-04 DIAGNOSIS — Z9889 Other specified postprocedural states: Secondary | ICD-10-CM

## 2024-03-04 DIAGNOSIS — C3490 Malignant neoplasm of unspecified part of unspecified bronchus or lung: Secondary | ICD-10-CM

## 2024-03-04 DIAGNOSIS — C3480 Malignant neoplasm of overlapping sites of unspecified bronchus and lung: Secondary | ICD-10-CM | POA: Diagnosis not present

## 2024-03-04 DIAGNOSIS — Z85048 Personal history of other malignant neoplasm of rectum, rectosigmoid junction, and anus: Secondary | ICD-10-CM

## 2024-03-04 NOTE — Patient Instructions (Signed)
 Is good to see you today. I am glad you have done well after your bronchoscopy with biopsies. 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 You already have follow-up with a medical oncologist this coming week. He can talk to you about treatment options. I have gone ahead and referred you to both radiation oncology and thoracic surgery. I have also ordered CT of the brain as well as pulmonary function testing to complete staging and to determine if you are a surgical candidate. You will get phone calls to get these scheduled, I have asked him to call your wife's number. Once you have spoken with all different specialties and have all the information you need to make a decision you can determine best options for treatment. Please call if you need us  for anything. They will take great care of you at the cancer center. Please contact office for sooner follow up if symptoms do not improve or worsen or seek emergency care

## 2024-03-04 NOTE — Progress Notes (Signed)
 History of Present Illness Derrick Johnson is a 68 y.o. male never smoker with history of rectal cancer in 2021 referred by Dr. Autumn  02/2024 for consideration of bronchoscopy with biopsies of a lung nodule  Synopsis Derrick Johnson is a 68 y.o. gentleman with a past medical history of rectal cancer diagnosed in February 2021, treated with concurrent chemoradiation using Xeloda , completed treatment in April 2021, patient did not follow-up with surgery or with oncology departments after this.  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  He was referred back to Dr. Autumn clinic by his PCP to reestablish care for his history of rectal cancer. On 02/08/2024, restaging CT chest, abdomen and pelvis showed multiple lung nodules, largest 1 measuring 1.9 cm, concerning for pulmonary metastatic disease. Previously seen circumferential superior rectal mass was no longer distinctly visible. No evidence of lymphadenopathy or metastatic disease in the abdomen or pelvis. Pt. Was referred for consideration of bronchoscopy with biopsy to get tissue sampling of the multiple lung nodules.      03/04/2024 Pt. And his wife Present for follow up after bronchoscopy with biopsies on 02/27/2024. He states he has done well after the procedure. No bleeding, fever, discolored secretions, worsening shortness of breath or adverse reaction to anesthesia.   Derrick Johnson is a 68 year old male with history rectal adenocarcinoma in 2021, who presents after bronchoscopy with biopsies to further evaluate lung nodules noted on imaging. The patient is a never smoker.  PET scan indicated these lung nodules did have hypermetabolic activity, but showed no evidence of metastatic adenopathy in the chest, abdomen, or pelvis, and no local recurrence of rectal cancer.    He underwent bronchoscopy with biopsies 02/27/2024. They biopsy of the left upper lobe, and the right upper lobe were positive for adenocarcinoma. We discussed that treatment options included radiation and potentially combination of thoracic surgery and radiation.  Patient has also been referred to medical oncology. I have also ordered CT of the brain with and without contrast to complete staging and to determine if patient may potentially be a surgical candidate.  He has several implants that eliminate MRI as an option.  Patient has never had pulmonary function test so these have also been ordered.  I did make all referrals urgent to expedite patient's consultations, evaluations and treatment.  Both the patient and his wife agree with the referrals made.   We have discussed the results of the biopsy and potential treatment modalities for the newly diagnosed cancer. They were obviously upset with the diagnosis.  Neither had questions or concerns at completion of the office visit.  I have encouraged him to reach out to us  for any questions or concerns moving forward.   Test Results: Cytology 02/27/2024 A. LUNG, LUL TARGET 1, FINE NEEDLE ASPIRATION:  - Adenocarcinoma, see comment   B. LUNG, LUL TARGET 1, BRUSHING:  - No malignant cells identified   C. 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 2. No evidence of metastatic adenopathy in the chest. 3. No evidence of metastatic disease in the abdomen pelvis. 4. No evidence of local rectal carcinoma recurrence.        Latest Ref Rng & Units  01/30/2024    1:57 PM 11/29/2023    8:02 AM 11/28/2023    2:27 AM  CBC  WBC 4.0 - 10.5 K/uL 7.9  9.6  9.3   Hemoglobin 13.0 - 17.0 g/dL 82.7  85.4  84.7   Hematocrit 39.0 - 52.0 % 51.4  42.3  44.4   Platelets 150 - 400 K/uL 250   210  215        Latest Ref Rng & Units 01/30/2024    1:57 PM 12/22/2023   10:27 AM 12/15/2023    9:00 AM  BMP  Glucose 70 - 99 mg/dL 884  880  889   BUN 8 - 23 mg/dL 6  10  6    Creatinine 0.61 - 1.24 mg/dL 8.84  8.87  8.96   Sodium 135 - 145 mmol/L 138  139  138   Potassium 3.5 - 5.1 mmol/L 4.6  4.7  4.5   Chloride 98 - 111 mmol/L 100  103  100   CO2 22 - 32 mmol/L 26  27  27    Calcium  8.9 - 10.3 mg/dL 89.8  9.6  9.5     BNP    Component Value Date/Time   BNP 98.5 12/22/2023 1027    ProBNP No results found for: PROBNP  PFT No results found for: FEV1PRE, FEV1POST, FVCPRE, FVCPOST, TLC, DLCOUNC, PREFEV1FVCRT, PSTFEV1FVCRT  NM PET Image Initial (PI) Skull Base To Thigh Result Date: 02/27/2024 CLINICAL DATA:  Initial treatment strategy for pulmonary nodules. History of rectal carcinoma. Radiation therapy 2021. EXAM: NUCLEAR MEDICINE PET SKULL BASE TO THIGH TECHNIQUE: 12.1 mCi F-18 FDG was injected intravenously. Full-ring PET imaging was performed from the skull base to thigh after the radiotracer. CT data was obtained and used for attenuation correction and anatomic localization. Fasting blood glucose: 127 mg/dl COMPARISON:  CT 93/94/7974 FINDINGS: NECK: No hypermetabolic lymph nodes in the neck. Incidental CT findings: None. CHEST: Rounded RIGHT apical nodule measuring 14 mm (image 42) has intense hypermetabolic activity with SUV max equal 9.4. Similar nodule in the medial LEFT upper lobe measuring 13 mm intense metabolic activity (image 50). Third hypoechoic nodule in the LEFT upper lobe measures 11 mm on image 2. No hypermetabolic mediastinal lymph nodes. Incidental CT findings: None. ABDOMEN/PELVIS: No abnormal hypermetabolic activity within the liver, pancreas, adrenal glands, or spleen. No hypermetabolic lymph nodes in the abdomen or pelvis. No abnormal metabolic activity in the rectum or colon. Incidental CT findings: Postcholecystectomy. SKELETON: No focal  hypermetabolic activity to suggest skeletal metastasis. Incidental CT findings: None. IMPRESSION: 1. 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 2. No evidence of metastatic adenopathy in the chest. 3. No evidence of metastatic disease in the abdomen pelvis. 4. No evidence of local rectal carcinoma recurrence. Electronically Signed   By: Jackquline Boxer M.D.   On: 02/27/2024 11:59   DG Chest Port 1 View Result Date: 02/27/2024 CLINICAL DATA:  Status post bronchoscopy with biopsy. EXAM: PORTABLE CHEST 1 VIEW COMPARISON:  February 08, 2024. FINDINGS: The heart size and mediastinal contours are within normal limits. No pneumothorax or pleural effusion is noted. No acute consolidative process is noted. The visualized skeletal structures are unremarkable. IMPRESSION: No pneumothorax or pleural effusion status post bronchoscopy with biopsy. Electronically Signed   By: Lynwood Landy Raddle M.D.   On: 02/27/2024 10:06   DG C-ARM BRONCHOSCOPY Result Date: 02/27/2024 C-ARM BRONCHOSCOPY: Fluoroscopy was utilized by the requesting physician.  No radiographic interpretation.  DG C-Arm 1-60 Min-No Report Result Date: 02/27/2024 Fluoroscopy was utilized by the requesting physician.  No radiographic interpretation.   CT CHEST ABDOMEN PELVIS W CONTRAST Result Date: 02/08/2024 CLINICAL DATA:  Rectal cancer restaging * Tracking Code: BO * EXAM: CT CHEST, ABDOMEN, AND PELVIS WITH CONTRAST TECHNIQUE: Multidetector CT imaging of the chest, abdomen and pelvis was performed following the standard protocol during bolus administration of intravenous contrast. RADIATION DOSE REDUCTION: This exam was performed according to the departmental dose-optimization program which includes automated exposure control, adjustment of the mA and/or kV according to patient size and/or use of iterative reconstruction technique. CONTRAST:  OMNIPAQUE  IOHEXOL  300  MG/ML  SOLN COMPARISON:  CT chest abdomen pelvis, 11/12/2019 FINDINGS: CT CHEST FINDINGS Cardiovascular: No significant vascular findings. Normal heart size. Left and right coronary artery calcifications. No pericardial effusion. Mediastinum/Nodes: No enlarged mediastinal, hilar, or axillary lymph nodes. Thyroid  gland, trachea, and esophagus demonstrate no significant findings. Lungs/Pleura: Multiple new spiculated bilateral pulmonary nodules, largest in the posterior right upper lobe measuring 1.9 x 1.7 cm (series 4, image 32), additional index nodule of the medial left upper lobe measuring 1.3 x 1.2 cm (series 4, image 63). No pleural effusion or pneumothorax. Musculoskeletal: No chest wall abnormality. No acute osseous findings. CT ABDOMEN PELVIS FINDINGS Hepatobiliary: No focal liver abnormality is seen. Status post cholecystectomy. No biliary dilatation. Pancreas: Unremarkable. No pancreatic ductal dilatation or surrounding inflammatory changes. Spleen: Normal in size without significant abnormality. Adrenals/Urinary Tract: Adrenal glands are unremarkable. Kidneys are normal, without renal calculi, solid lesion, or hydronephrosis. Bladder is unremarkable. Stomach/Bowel: Stomach is within normal limits. Appendix appears normal. No evidence of bowel wall thickening, distention, or inflammatory changes. Previously seen circumferential superior rectal mass is no longer distinctly visible (series 2, image 112). Vascular/Lymphatic: No significant vascular findings are present. No enlarged abdominal or pelvic lymph nodes. Reproductive: No mass or other abnormality. Other: No abdominal wall hernia or abnormality. No ascites. Musculoskeletal: No acute osseous findings. IMPRESSION: 1. Multiple new spiculated bilateral pulmonary nodules, consistent with pulmonary metastases. 2. Previously seen circumferential superior rectal mass is no longer distinctly visible, presumably treated in the interval. 3. No evidence of  lymphadenopathy or metastatic disease in the abdomen or pelvis. 4. Coronary artery disease. Electronically Signed   By: Marolyn JONETTA Jaksch M.D.   On: 02/08/2024 19:03     Past medical hx Past Medical History:  Diagnosis Date   Asthma    Back contusion    CAD (coronary artery disease) 11/27/2023   s/p PCI   Chronic migraine    Chronic pain    Dissection of cerebral artery (HCC)    Electrocution and nonfatal effects of electric current    Fibromyalgia    GERD (gastroesophageal reflux disease)    in past, no current problem per pt on 02/26/24   Ischemic cardiomyopathy    Pneumonia    Rectal cancer (HCC)    STEMI (ST elevation myocardial infarction) (HCC) 11/27/2023   Stroke (HCC)      Social History   Tobacco Use   Smoking status: Never    Passive exposure: Never   Smokeless tobacco: Never  Vaping Use   Vaping status: Never Used  Substance Use Topics   Alcohol use: Not Currently   Drug use: No    Derrick Johnson reports that he has never smoked. He has never been exposed to tobacco smoke. He has never used smokeless tobacco. He reports that he does not currently use alcohol. He reports that he does not use  drugs.  Tobacco Cessation: Never smoker   Past surgical hx, Family hx, Social hx all reviewed.  Current Outpatient Medications on File Prior to Visit  Medication Sig   albuterol  (PROVENTIL ) (2.5 MG/3ML) 0.083% nebulizer solution Take 2.5 mg by nebulization every 6 (six) hours as needed for wheezing or shortness of breath.    albuterol  (VENTOLIN  HFA) 108 (90 Base) MCG/ACT inhaler Inhale 1-2 puffs into the lungs every 6 (six) hours as needed for wheezing or shortness of breath.   aspirin  81 MG chewable tablet Chew 1 tablet (81 mg total) by mouth daily.   atorvastatin  (LIPITOR) 80 MG tablet Take 1 tablet (80 mg total) by mouth daily.   clopidogrel  (PLAVIX ) 75 MG tablet Take 1 tablet (75 mg total) by mouth daily. Okay to restart this medication on 02/28/2024.   cyclobenzaprine  (FLEXERIL) 10 MG tablet Take 10 mg by mouth every 8 (eight) hours as needed for muscle spasms.   furosemide  (LASIX ) 20 MG tablet Take 1 tablet (20 mg total) by mouth daily.   methadone  (DOLOPHINE ) 10 MG tablet Take 1 tablet (10 mg total) by mouth in the morning, at noon, in the evening, and at bedtime.   methylphenidate (RITALIN) 20 MG tablet Take 20 mg by mouth 3 (three) times daily.   metoprolol  succinate (TOPROL -XL) 25 MG 24 hr tablet Take 1 tablet (25 mg total) by mouth at bedtime.   Multiple Vitamin (MULTIVITAMIN) capsule Take 1 capsule by mouth daily.   nitroGLYCERIN  (NITROSTAT ) 0.4 MG SL tablet Place 1 tablet (0.4 mg total) under the tongue every 5 (five) minutes x 3 doses as needed for chest pain.   polyethylene glycol (MIRALAX) 17 g packet Take 17 g by mouth daily.   sacubitril-valsartan (ENTRESTO ) 24-26 MG Take 1 tablet by mouth 2 (two) times daily.   spironolactone  (ALDACTONE ) 25 MG tablet Take 0.5 tablets (12.5 mg total) by mouth daily.   SUMAtriptan  (IMITREX ) 100 MG tablet Take 100 mg by mouth every 2 (two) hours as needed for migraine.   tamsulosin (FLOMAX) 0.4 MG CAPS capsule Take 0.4-0.8 mg by mouth See admin instructions. Take 0.4mg  (1 capsule) by mouth daily for 2 weeks, then increase to 0.8mg  (2 capsules) daily,   traZODone  (DESYREL ) 150 MG tablet Take 0.5 tablets (75 mg total) by mouth at bedtime as needed for sleep.   VITAMIN D -VITAMIN K PO Take 1 capsule by mouth daily. 10,000IU/ 100 mcg   Current Facility-Administered Medications on File Prior to Visit  Medication   clindamycin  (CLEOCIN ) 900 mg in dextrose  5 % 50 mL IVPB   And   gentamicin  (GARAMYCIN ) 5 mg/kg in dextrose  5 % 50 mL IVPB     Allergies  Allergen Reactions   Latex Other (See Comments)    Skin irritation    Review Of Systems:  Constitutional:   No  weight loss, night sweats,  Fevers, chills, fatigue, or  lassitude.  HEENT:   No headaches,  Difficulty swallowing,  Tooth/dental problems, or  Sore  throat,                No sneezing, itching, ear ache, nasal congestion, post nasal drip,   CV:  No chest pain,  Orthopnea, PND, swelling in lower extremities, anasarca, dizziness, palpitations, syncope.   GI  No heartburn, indigestion, abdominal pain, nausea, vomiting, diarrhea, change in bowel habits, loss of appetite, bloody stools.   Resp: No shortness of breath with exertion or at rest.  No excess mucus, no productive cough,  No non-productive  cough,  No coughing up of blood.  No change in color of mucus.  No wheezing.  No chest wall deformity  Skin: no rash or lesions.  GU: no dysuria, change in color of urine, no urgency or frequency.  No flank pain, no hematuria   MS:  No joint pain or swelling.  No decreased range of motion.  No back pain.  Psych:  No change in mood or affect. No depression or anxiety.  No memory loss.   Vital Signs BP 129/80 (BP Location: Left Arm, Patient Position: Sitting, Cuff Size: Large)   Pulse 77   Ht 5' 9 (1.753 m)   Wt 220 lb 12.8 oz (100.2 kg)   SpO2 97%   BMI 32.61 kg/m    Physical Exam:  General- No distress,  A&Ox3, pleasant ENT: No sinus tenderness, TM clear, pale nasal mucosa, no oral exudate,no post nasal drip, no LAN Cardiac: S1, S2, regular rate and rhythm, no murmur Chest: No wheeze/ rales/ dullness; no accessory muscle use, no nasal flaring, no sternal retractions Abd.: Soft Non-tender, nondistended, bowel sounds positive,Body mass index is 32.61 kg/m.  Ext: No clubbing cyanosis, edema, no obvious deformities Neuro:  normal strength, moving all extremities x 4, alert and oriented x 3, appropriate Skin: No rashes, warm and dry, no obvious skin lesions Psych: normal mood and behavior   Assessment/Plan New diagnosis adenocarcinoma of the lung versus metastatic disease from rectum Post bronchoscopy with biopsies Never smoker Plan I am glad you have done well after your bronchoscopy with biopsies. We have discussed the results  of your biopsy which was positive for adenocarcinoma of the lung. You already have follow-up with a medical oncologist this coming week. He can talk to you about treatment options. I have gone ahead and referred you to both radiation oncology and thoracic surgery. I have also ordered CT of the brain as well as pulmonary function testing to complete staging and to determine if you are a surgical candidate. You will get phone calls to get these scheduled, I have asked him to call your wife's number. Once you have spoken with all different specialties and have all the information you need to make a decision you can determine best options for treatment. Please call if you need us  for anything. They will take great care of you at the cancer center. Please contact office for sooner follow up if symptoms do not improve or worsen or seek emergency care   I spent 40 minutes dedicated to the care of this patient on the date of this encounter to include pre-visit review of records, face-to-face time with the patient discussing conditions above, post visit ordering of testing, clinical documentation with the electronic health record, making appropriate referrals as documented, and communicating necessary information to the patient's healthcare team.     Lauraine JULIANNA Lites, NP 03/04/2024  10:42 AM

## 2024-03-05 LAB — CYTOLOGY - NON PAP

## 2024-03-07 ENCOUNTER — Encounter: Payer: Self-pay | Admitting: *Deleted

## 2024-03-07 ENCOUNTER — Ambulatory Visit

## 2024-03-07 DIAGNOSIS — C3480 Malignant neoplasm of overlapping sites of unspecified bronchus and lung: Secondary | ICD-10-CM

## 2024-03-07 LAB — PULMONARY FUNCTION TEST
DL/VA % pred: 112 %
DL/VA: 4.61 ml/min/mmHg/L
DLCO cor % pred: 98 %
DLCO cor: 25.3 ml/min/mmHg
DLCO unc % pred: 105 %
DLCO unc: 26.98 ml/min/mmHg
FEF 25-75 Post: 3.63 L/s
FEF 25-75 Pre: 3.24 L/s
FEF2575-%Change-Post: 12 %
FEF2575-%Pred-Post: 143 %
FEF2575-%Pred-Pre: 127 %
FEV1-%Change-Post: 4 %
FEV1-%Pred-Post: 98 %
FEV1-%Pred-Pre: 94 %
FEV1-Post: 3.19 L
FEV1-Pre: 3.06 L
FEV1FVC-%Change-Post: 0 %
FEV1FVC-%Pred-Pre: 108 %
FEV6-%Change-Post: 4 %
FEV6-%Pred-Post: 95 %
FEV6-%Pred-Pre: 91 %
FEV6-Post: 3.94 L
FEV6-Pre: 3.79 L
FEV6FVC-%Change-Post: 0 %
FEV6FVC-%Pred-Post: 104 %
FEV6FVC-%Pred-Pre: 105 %
FVC-%Change-Post: 4 %
FVC-%Pred-Post: 91 %
FVC-%Pred-Pre: 87 %
FVC-Post: 3.98 L
FVC-Pre: 3.8 L
Post FEV1/FVC ratio: 80 %
Post FEV6/FVC ratio: 99 %
Pre FEV1/FVC ratio: 81 %
Pre FEV6/FVC Ratio: 100 %
RV % pred: 84 %
RV: 1.98 L
TLC % pred: 86 %
TLC: 5.91 L

## 2024-03-07 NOTE — Progress Notes (Signed)
 Foundation one testing requested on accession number [id number]

## 2024-03-07 NOTE — Progress Notes (Signed)
 Full pft performed today.

## 2024-03-11 ENCOUNTER — Telehealth: Payer: Self-pay | Admitting: Oncology

## 2024-03-11 NOTE — Progress Notes (Incomplete)
 Radiation Oncology         [phone] ________________________________  Name: Derrick Johnson        MRN: [id number]  Date of Service: 03/14/2024 DOB: [birth year]  RR:Zoxpwd, Tanda Mae, MD  Shelah Lamar RAMAN, MD     REFERRING PHYSICIAN: Shelah Lamar RAMAN, MD   DIAGNOSIS: The encounter diagnosis was Adenocarcinoma of rectum [facility]).   HISTORY OF PRESENT ILLNESS: Derrick Johnson is a 68 y.o. male seen at the request of Dr. Shelah for a diagnosis of***.  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  He was rereferred back to establish his care and medical oncology and was seen with Dr. Autumn.  A restaging CT chest abdomen pelvis on 02/08/2024 showed multiple new bilateral pulmonary nodules concerning for metastatic disease.  The previously seen circumferential rectal thickening was no longer visible and no other evidence of adenopathy or metastatic disease was present in the abdomen or pelvis.  He underwent a PET scan on 02/23/2024 which showed hypermetabolic activity in a right upper lobe nodule that measured 14 mm with an SUV of 9.4 as well as a left upper lobe nodule measuring 13 mm with intense hypermetabolic activity SUV not given, a third nodule located in the left upper lobe measured 11 mm, though metabolic activity was not mentioned.  He underwent bronchoscopy on 02/27/2024 with Dr. Shelah, specimens from target one of the left upper lobe fine-needle aspirate showed adenocarcinoma consistent with a colorectal primary brushings were negative from that specimen and right upper lobe fine-needle aspirate was consistent again with adenocarcinoma also consistent with colorectal cancer.  A CT of the head with and without contrast was performed yesterday and MRI was not performed given his spinal cord stimulator.  This showed***.  He is scheduled to meet with Dr.  Shyrl this Friday to discuss surgical resection and is seen today to discuss the alternative to that which would be consideration of stereotactic body radiotherapy.    PREVIOUS RADIATION THERAPY: Yes   11/25/19 - 01/01/20:    The patient was treated to the pelvis to a dose of 45 Gy at 1.8 Gy per fraction. This was accomplished using a 4 field 3-D conformal technique. The patient then received a boost to the tumor and adjacent high-risk regions for an additional 5.4 Gy at 1.8 gray per fraction. This was carried out using a coned-down 4 field approach. The patient's total dose was 50.4 Gy. Daily AlignRT was used on a daily basis to insure proper patient positioning and localization of critical targets/ structures. The patient received concurrent chemotherapy during the course of radiation treatment.   PAST MEDICAL HISTORY:  Past Medical History:  Diagnosis Date   Asthma    Back contusion    CAD (coronary artery disease) 11/27/2023   s/p PCI   Chronic migraine    Chronic pain    Dissection of cerebral artery (HCC)    Electrocution and nonfatal effects of electric current    Fibromyalgia    GERD (gastroesophageal reflux disease)    in past, no current problem per pt on 02/26/24   Ischemic cardiomyopathy    Pneumonia    Rectal cancer (HCC)    STEMI (ST elevation myocardial infarction) (HCC) 11/27/2023   Stroke (HCC)        PAST SURGICAL HISTORY: Past Surgical History:  Procedure Laterality Date  COLONOSCOPY     CORONARY/GRAFT ACUTE MI REVASCULARIZATION N/A 11/27/2023   Procedure: Coronary/Graft Acute MI Revascularization;  Surgeon: Thukkani, Arun K, MD;  Location: [facility] INV[facility]IVE [facility];  Service: Cardiovascular;  Laterality: N/A;   CRYO INTERCOSTAL NERVE BLOCK     LEFT HEART CATH AND CORONARY ANGIOGRAPHY N/A 11/27/2023   Procedure: LEFT HEART CATH AND CORONARY ANGIOGRAPHY;  Surgeon: Wendel Lurena POUR, MD;  Location: [facility] INVASIVE CV LAB;  Service: Cardiovascular;  Laterality: N/A;    maxofacial surgery     ROTATOR CUFF REPAIR Left    SPINAL CORD STIMULATOR IMPLANT     does not work per patient in years   SPINE SURGERY     TMJ ARTHROSCOPY     VIDEO BRONCHOSCOPY WITH ENDOBRONCHIAL NAVIGATION Bilateral 02/27/2024   Procedure: VIDEO BRONCHOSCOPY WITH ENDOBRONCHIAL NAVIGATION;  Surgeon: Shelah Lamar RAMAN, MD;  Location: [facility] ENDOSCOPY;  Service: Pulmonary;  Laterality: Bilateral;     FAMILY HISTORY:  Family History  Problem Relation Age of Onset   Hyperlipidemia Mother    Heart disease Father    Thyroid  disease Neg Hx      SOCIAL HISTORY:  reports that he has never smoked. He has never been exposed to tobacco smoke. He has never used smokeless tobacco. He reports that he does not currently use alcohol. He reports that he does not use drugs.   ALLERGIES: Latex   MEDICATIONS:  Current Outpatient Medications  Medication Sig Dispense Refill   albuterol  (PROVENTIL ) (2.5 MG/3ML) 0.083% nebulizer solution Take 2.5 mg by nebulization every 6 (six) hours as needed for wheezing or shortness of breath.      albuterol  (VENTOLIN  HFA) 108 (90 Base) MCG/ACT inhaler Inhale 1-2 puffs into the lungs every 6 (six) hours as needed for wheezing or shortness of breath. 1 Inhaler 5   aspirin  81 MG chewable tablet Chew 1 tablet (81 mg total) by mouth daily. 90 tablet 3   atorvastatin  (LIPITOR) 80 MG tablet Take 1 tablet (80 mg total) by mouth daily. 90 tablet 3   clopidogrel  (PLAVIX ) 75 MG tablet Take 1 tablet (75 mg total) by mouth daily. Okay to restart this medication on 02/28/2024.     cyclobenzaprine (FLEXERIL) 10 MG tablet Take 10 mg by mouth every 8 (eight) hours as needed for muscle spasms.     furosemide  (LASIX ) 20 MG tablet Take 1 tablet (20 mg total) by mouth daily. 90 tablet 3   methadone  (DOLOPHINE ) 10 MG tablet Take 1 tablet (10 mg total) by mouth in the morning, at noon, in the evening, and at bedtime.     methylphenidate (RITALIN) 20 MG tablet Take 20 mg by mouth 3 (three)  times daily.     metoprolol  succinate (TOPROL -XL) 25 MG 24 hr tablet Take 1 tablet (25 mg total) by mouth at bedtime.     Multiple Vitamin (MULTIVITAMIN) capsule Take 1 capsule by mouth daily.     nitroGLYCERIN  (NITROSTAT ) 0.4 MG SL tablet Place 1 tablet (0.4 mg total) under the tongue every 5 (five) minutes x 3 doses as needed for chest pain. 25 tablet 3   polyethylene glycol (MIRALAX) 17 g packet Take 17 g by mouth daily.     sacubitril-valsartan (ENTRESTO ) 24-26 MG Take 1 tablet by mouth 2 (two) times daily. 60 tablet 11   spironolactone  (ALDACTONE ) 25 MG tablet Take 0.5 tablets (12.5 mg total) by mouth daily. 45 tablet 3   SUMAtriptan  (IMITREX ) 100 MG tablet Take 100 mg by mouth every 2 (two) hours as  needed for migraine.     tamsulosin (FLOMAX) 0.4 MG CAPS capsule Take 0.4-0.8 mg by mouth See admin instructions. Take 0.4mg  (1 capsule) by mouth daily for 2 weeks, then increase to 0.8mg  (2 capsules) daily,     traZODone  (DESYREL ) 150 MG tablet Take 0.5 tablets (75 mg total) by mouth at bedtime as needed for sleep.     VITAMIN D -VITAMIN K PO Take 1 capsule by mouth daily. 10,000IU/ 100 mcg     No current facility-administered medications for this visit.   Facility-Administered Medications Ordered in Other Visits  Medication Dose Route Frequency Provider Last Rate Last Admin   clindamycin  (CLEOCIN ) 900 mg in dextrose  5 % 50 mL IVPB  900 mg Intravenous 60 min Pre-Op Debby Hila, MD       And   gentamicin  (GARAMYCIN ) 5 mg/kg in dextrose  5 % 50 mL IVPB  5 mg/kg Intravenous 60 min Pre-Op Debby Hila, MD         REVIEW OF SYSTEMS: On review of systems, the patient reports that *** is doing well overall. *** denies any chest pain, shortness of breath, cough, fevers, chills, night sweats, unintended weight changes. *** denies any bowel or bladder disturbances, and denies abdominal pain, nausea or vomiting. *** denies any new musculoskeletal or joint aches or pains. A complete review of systems  is obtained and is otherwise negative.     PHYSICAL EXAM:  Wt Readings from Last 3 Encounters:  03/04/24 220 lb 12.8 oz (100.2 kg)  02/27/24 219 lb (99.3 kg)  02/16/24 219 lb 12.8 oz (99.7 kg)   Temp Readings from Last 3 Encounters:  02/27/24 98.7 F (37.1 C)  01/30/24 97.6 F (36.4 C) (Oral)  11/29/23 98.7 F (37.1 C) (Oral)   BP Readings from Last 3 Encounters:  03/04/24 129/80  02/27/24 103/79  02/16/24 121/74   Pulse Readings from Last 3 Encounters:  03/04/24 77  02/27/24 64  02/16/24 73    /10  In general this is a well appearing *** in no acute distress. ***'s alert and oriented x4 and appropriate throughout the examination. Cardiopulmonary assessment is negative for acute distress and *** exhibits normal effort.     ECOG = ***  0 - Asymptomatic (Fully active, able to carry on all predisease activities without restriction)  1 - Symptomatic but completely ambulatory (Restricted in physically strenuous activity but ambulatory and able to carry out work of a light or sedentary nature. For example, light housework, office work)  2 - Symptomatic, <50% in bed during the day (Ambulatory and capable of all self care but unable to carry out any work activities. Up and about more than 50% of waking hours)  3 - Symptomatic, >50% in bed, but not bedbound (Capable of only limited self-care, confined to bed or chair 50% or more of waking hours)  4 - Bedbound (Completely disabled. Cannot carry on any self-care. Totally confined to bed or chair)  5 - Death   Raylene MM, Creech RH, Tormey DC, et al. [phone]). Toxicity and response criteria of the [facility]. Am. DOROTHA Bridges. Oncol. 5 (6): 649-55    LABORATORY DATA:  Lab Results  Component Value Date   WBC 7.9 01/30/2024   HGB 17.2 (H) 01/30/2024   HCT 51.4 01/30/2024   MCV 86.8 01/30/2024   PLT 250 01/30/2024   Lab Results  Component Value Date   NA 138 01/30/2024   K 4.6 01/30/2024   CL 100  01/30/2024   CO2  26 01/30/2024   Lab Results  Component Value Date   ALT 29 01/30/2024   AST 31 01/30/2024   ALKPHOS 95 01/30/2024   BILITOT 0.4 01/30/2024      RADIOGRAPHY: NM PET Image Initial (PI) Skull Base To Thigh Result Date: 02/27/2024 CLINICAL DATA:  Initial treatment strategy for pulmonary nodules. History of rectal carcinoma. Radiation therapy 2021. EXAM: NUCLEAR MEDICINE PET SKULL BASE TO THIGH TECHNIQUE: 12.1 mCi F-18 FDG was injected intravenously. Full-ring PET imaging was performed from the skull base to thigh after the radiotracer. CT data was obtained and used for attenuation correction and anatomic localization. Fasting blood glucose: 127 mg/dl COMPARISON:  CT 93/94/7974 FINDINGS: NECK: No hypermetabolic lymph nodes in the neck. Incidental CT findings: None. CHEST: Rounded RIGHT apical nodule measuring 14 mm (image 42) has intense hypermetabolic activity with SUV max equal 9.4. Similar nodule in the medial LEFT upper lobe measuring 13 mm intense metabolic activity (image 50). Third hypoechoic nodule in the LEFT upper lobe measures 11 mm on image 2. No hypermetabolic mediastinal lymph nodes. Incidental CT findings: None. ABDOMEN/PELVIS: No abnormal hypermetabolic activity within the liver, pancreas, adrenal glands, or spleen. No hypermetabolic lymph nodes in the abdomen or pelvis. No abnormal metabolic activity in the rectum or colon. Incidental CT findings: Postcholecystectomy. SKELETON: No focal hypermetabolic activity to suggest skeletal metastasis. Incidental CT findings: None. IMPRESSION: 1. 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 2. No evidence of metastatic adenopathy in the chest. 3. No evidence of metastatic disease in the abdomen pelvis. 4. No evidence of local rectal carcinoma recurrence. Electronically Signed   By: Jackquline Boxer M.D.   On: 02/27/2024 11:59   DG Chest Port 1  View Result Date: 02/27/2024 CLINICAL DATA:  Status post bronchoscopy with biopsy. EXAM: PORTABLE CHEST 1 VIEW COMPARISON:  February 08, 2024. FINDINGS: The heart size and mediastinal contours are within normal limits. No pneumothorax or pleural effusion is noted. No acute consolidative process is noted. The visualized skeletal structures are unremarkable. IMPRESSION: No pneumothorax or pleural effusion status post bronchoscopy with biopsy. Electronically Signed   By: Lynwood Landy Raddle M.D.   On: 02/27/2024 10:06   DG C-ARM BRONCHOSCOPY Result Date: 02/27/2024 C-ARM BRONCHOSCOPY: Fluoroscopy was utilized by the requesting physician.  No radiographic interpretation.   DG C-Arm 1-60 Min-No Report Result Date: 02/27/2024 Fluoroscopy was utilized by the requesting physician.  No radiographic interpretation.       IMPRESSION/PLAN: 1. 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 Dr. Dewey discusses the pathology findings and reviews the nature of *** Dr. Dewey reviews that the standard of care is for surgical resection. However for patients who are not medical candidates to undergo surgery, or who choose to forgo surgery, stereotactic body radiotherapy (SBRT) is an appropriate alternative. We discussed the risks, benefits, short, and long term effects of radiotherapy, as well as the locally curative intent.. Dr. Dewey discusses the delivery and logistics of radiotherapy and anticipates a course of *** . We will follow up with his decision making after his visit on Friday with Dr. Shyrl.    In a visit lasting *** minutes, greater than 50% of the time was spent face to face discussing the patient's condition, in preparation for the discussion, and coordinating the patient's care.   The above documentation reflects my direct findings during this shared patient visit. Please see the separate note by Dr. Dewey on this  date for the remainder of the patient's plan of  care.    Donald KYM Husband, [facility]   **Disclaimer: This note was dictated with voice recognition software. Similar sounding words can inadvertently be transcribed and this note may contain transcription errors which may not have been corrected upon publication of note.**

## 2024-03-11 NOTE — Telephone Encounter (Signed)
 Added lab appt to 7/8 appt. Confirmed with PT.

## 2024-03-12 ENCOUNTER — Inpatient Hospital Stay: Attending: Oncology | Admitting: Oncology

## 2024-03-12 ENCOUNTER — Other Ambulatory Visit: Payer: Self-pay | Admitting: *Deleted

## 2024-03-12 ENCOUNTER — Encounter: Payer: Self-pay | Admitting: Oncology

## 2024-03-12 ENCOUNTER — Other Ambulatory Visit

## 2024-03-12 ENCOUNTER — Encounter: Payer: Self-pay | Admitting: *Deleted

## 2024-03-12 ENCOUNTER — Inpatient Hospital Stay

## 2024-03-12 ENCOUNTER — Other Ambulatory Visit: Payer: Self-pay

## 2024-03-12 VITALS — BP 135/89 | HR 67 | Temp 98.7°F | Resp 18 | Ht 69.0 in | Wt 218.9 lb

## 2024-03-12 DIAGNOSIS — C7801 Secondary malignant neoplasm of right lung: Secondary | ICD-10-CM | POA: Insufficient documentation

## 2024-03-12 DIAGNOSIS — Z79899 Other long term (current) drug therapy: Secondary | ICD-10-CM | POA: Diagnosis not present

## 2024-03-12 DIAGNOSIS — Z5111 Encounter for antineoplastic chemotherapy: Secondary | ICD-10-CM | POA: Insufficient documentation

## 2024-03-12 DIAGNOSIS — K5909 Other constipation: Secondary | ICD-10-CM | POA: Diagnosis not present

## 2024-03-12 DIAGNOSIS — M797 Fibromyalgia: Secondary | ICD-10-CM | POA: Diagnosis not present

## 2024-03-12 DIAGNOSIS — G43909 Migraine, unspecified, not intractable, without status migrainosus: Secondary | ICD-10-CM | POA: Insufficient documentation

## 2024-03-12 DIAGNOSIS — C7802 Secondary malignant neoplasm of left lung: Secondary | ICD-10-CM | POA: Insufficient documentation

## 2024-03-12 DIAGNOSIS — Z8673 Personal history of transient ischemic attack (TIA), and cerebral infarction without residual deficits: Secondary | ICD-10-CM | POA: Diagnosis not present

## 2024-03-12 DIAGNOSIS — Z923 Personal history of irradiation: Secondary | ICD-10-CM | POA: Diagnosis not present

## 2024-03-12 DIAGNOSIS — I2510 Atherosclerotic heart disease of native coronary artery without angina pectoris: Secondary | ICD-10-CM | POA: Diagnosis not present

## 2024-03-12 DIAGNOSIS — C20 Malignant neoplasm of rectum: Secondary | ICD-10-CM

## 2024-03-12 DIAGNOSIS — Z7982 Long term (current) use of aspirin: Secondary | ICD-10-CM | POA: Insufficient documentation

## 2024-03-12 DIAGNOSIS — Z7902 Long term (current) use of antithrombotics/antiplatelets: Secondary | ICD-10-CM | POA: Insufficient documentation

## 2024-03-12 DIAGNOSIS — M549 Dorsalgia, unspecified: Secondary | ICD-10-CM | POA: Diagnosis not present

## 2024-03-12 DIAGNOSIS — I252 Old myocardial infarction: Secondary | ICD-10-CM | POA: Diagnosis not present

## 2024-03-12 MED ORDER — DEXAMETHASONE 4 MG PO TABS: 8.0000 mg | ORAL_TABLET | Freq: Every day | ORAL | 1 refills | Status: DC

## 2024-03-12 MED ORDER — ONDANSETRON HCL 8 MG PO TABS
8.0000 mg | ORAL_TABLET | Freq: Three times a day (TID) | ORAL | 1 refills | Status: DC | PRN
Start: 2024-03-12 — End: 2024-06-07

## 2024-03-12 MED ORDER — LIDOCAINE-PRILOCAINE 2.5-2.5 % EX CREA: TOPICAL_CREAM | CUTANEOUS | 3 refills | Status: AC

## 2024-03-12 MED ORDER — PROCHLORPERAZINE MALEATE 10 MG PO TABS: 10.0000 mg | ORAL_TABLET | Freq: Four times a day (QID) | ORAL | 1 refills | Status: DC | PRN

## 2024-03-12 NOTE — Progress Notes (Signed)
 Patient has port-a-catheter placement appointment with IR per Dr. Autumn for Chemotherapy.

## 2024-03-12 NOTE — Progress Notes (Signed)
 [facility]  ONCOLOGY CLINIC PROGRESS NOTE   Patient Care Team: Derrick Tanda Mae, MD as PCP - General (Family Medicine) Johnson, Derrick K, MD as PCP - Cardiology (Cardiology) Babs Derrick DASEN, MD as Consulting Physician (Physical Medicine and Rehabilitation) Derrick Fidela CROME, NP as Nurse Practitioner (Physical Medicine and Rehabilitation) Derrick Lesta FALCON, MD as Consulting Physician (Gastroenterology)  PATIENT NAME: Derrick Johnson   MR#: [id number] DOB: 01/30/1956  Date of visit: 03/12/2024   ASSESSMENT & PLAN:   Derrick Johnson is a 68 y.o. gentleman with a past medical history of rectal cancer diagnosed in February 2021, treated with concurrent chemoradiation using Xeloda , completed treatment in April 2021, patient did not follow-up with surgery or with oncology departments after this. 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 He was referred back to our clinic by his PCP to reestablish care for his history of rectal cancer.  He has recurrence of disease with biopsy-proven lung mets.  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  2 small perirectal nodes measuring 3 to 4 mm  Lower EUS 11/13/2019-tumor in the proximal rectum.  Proximal posterolateral rectal mass.  2 abnormal lymph nodes in the perirectal region.  Staged T3N1 by endorectal ultrasound.    Previously he was seen by Derrick Johnson in our clinic.  He received treatments with Xeloda /radiation 11/25/2019-01/01/2020  He was supposed to undergo surgical resection under the direction of Derrick Johnson  in June 2021.  Patient apparently had dental infections around that time and needed dental extractions and patient deferred surgery and did not seek follow-up care either with the surgeons or with our department.  He also did not have follow-up appointments with gastroenterology.  His PCP referred him back to his and he reestablished care with us  on 01/30/2024.  CEA was normal at 1.68.  CBCD and CMP were unremarkable.  Plan made for restaging CT scan of the chest, abdomen and pelvis and referral sent to GI for endoscopic evaluation.  Patient cannot get MRIs because of nerve stimulator in place.  On 02/08/2024, restaging CT chest, abdomen and pelvis showed multiple lung nodules, largest 1 measuring 1.9 cm, concerning for pulmonary metastatic disease.  Previously seen circumferential superior rectal mass was no longer distinctly visible.  No evidence of lymphadenopathy or metastatic disease in the abdomen or pelvis.  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  No evidence of metastatic adenopathy in the chest or evidence of other metastatic disease in the abdomen or pelvis.  On 02/27/2024, Derrick Johnson performed flexible video fiberoptic bronchoscopy with robotic assistance and biopsies of right upper lobe lung nodule.  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  Immunostains were positive for CK20 and CDX2, negative for CK7 and TTF-1, consistent with a colorectal primary.  cTx,cNx,pM1a, Stage IV A disease.   Today I discussed biopsy results with the patient.  Discussed updated staging, prognosis, plan of care, treatment options.  Reviewed NCCN guidelines.  Will submit NGS testing on the specimen.  All treatment options are palliative in nature, given stage IV disease.  He does have appointment coming up with  cardiothoracic surgery Derrick Johnson for possible resection of the lung  lesions.  He will need systemic treatment with FOLFOX plus Avastin -either prior to lung nodule resection or after. (Avastin will be added from cycle 2 onwards).   He did complete chemo education today.  Port-A-Cath placement is currently scheduled on 03/20/2024.  Will plan to begin chemotherapy thereafter.  Patient will be seen by another provider during my absence later this month.   Chronic Migraine Chronic migraines with severe episodes lasting three to four days a week. Current episode has persisted for two weeks, possibly influenced by weather changes. No worsening of headaches reported. - Proceed with head CT scan to evaluate headaches.  I reviewed lab results and outside records for this visit and discussed relevant results with the patient. Diagnosis, plan of care and treatment options were also discussed in detail with the patient. Opportunity provided to ask questions and answers provided to his apparent satisfaction. Provided instructions to call our clinic with any problems, questions or concerns prior to return visit. I recommended to continue follow-up with PCP and sub-specialists. He verbalized understanding and agreed with the plan.   NCCN guidelines have been consulted in the planning of this patient's care.  I spent a total of 40 minutes during this encounter with the patient including review of chart and various tests results, discussions about plan of care and coordination of care plan.   Derrick Patten, MD  03/12/2024 10:44 AM  [facility] - A [facility] [id number] [place] [address] Dept: [phone] Dept Fax: [phone]    CHIEF COMPLAINT/ REASON FOR VISIT:   Rectal cancer, initially diagnosed and treated in 2021 with concurrent chemoradiation using Xeloda .  Lost to follow-up.  Now with metastatic recurrence in the lungs, biopsy-proven in June 2025.  Current Treatment:  Palliative systemic treatment with FOLFOX.  Avastin to be added from cycle 2 onwards.  Pending cardiothoracic surgery evaluation for lung nodules.  INTERVAL HISTORY:    Discussed the use of AI scribe software for clinical note transcription with the patient, who gave verbal consent to proceed.  History of Present Illness Jaevian Shean is a 68 year old male with metastatic rectal cancer who presents for follow-up after recent lung biopsy results.  He has a history of rectal cancer that has metastasized to the lungs. Two weeks ago, he underwent a bronchoscopy, and the biopsies confirmed the presence of cancerous lung nodules.  He experiences chronic migraines, describing them as severe and lasting three to four days a week. The current migraine episode has persisted for two weeks, possibly exacerbated by weather changes. He has a history of consulting a pain doctor for these issues but has not been under his care recently due to logistical challenges.  He is currently on pain medications but has expressed concerns about managing his pain effectively, given his extensive history of pain issues. He is considering transferring his care to a local provider at [facility] to facilitate easier access to his medical records and care.  No worsening of headaches recently.   I have reviewed the past medical history, past surgical history, social history and family history with the patient and they are unchanged from previous note.  HISTORY OF PRESENT ILLNESS:   ONCOLOGY HISTORY:   Colonoscopy 10/29/2019-rectal mass at approximately 10-11 cm above the anus, biopsied and tattooed, pathology confirmed invasive well-differentiated adenocarcinoma, normal mismatch repair protein expression  CTs 11/12/2019-mild mid rectal wall thickening.  2 small perirectal nodes measuring 3 to 4 mm   Lower EUS 11/13/2019-tumor in the proximal rectum.  Proximal posterolateral rectal mass.  2 abnormal lymph nodes in  the perirectal region.  Staged T3N1 by endorectal ultrasound.     Xeloda /radiation 11/25/2019-01/01/2020   He was supposed to undergo surgical resection under the direction of Derrick Johnson in June 2021.  Patient apparently had dental infections around that time and needed dental extractions and patient deferred surgery and did not seek follow-up care either with the surgeons or with our department.  He also did not have follow-up appointments with gastroenterology.   His PCP referred him back to his and he reestablished care with us  on 01/30/2024.  CEA was normal at 1.68.  CBCD and CMP were unremarkable.  Plan made for restaging CT scan of the chest, abdomen and pelvis and referral sent to GI for endoscopic evaluation.  Patient cannot get MRIs because of nerve stimulator in place.   On 02/08/2024, restaging CT chest, abdomen and pelvis showed multiple lung nodules, largest 1 measuring 1.9 cm, concerning for pulmonary metastatic disease.  Previously seen circumferential superior rectal mass was no longer distinctly visible.  No evidence of lymphadenopathy or metastatic disease in the abdomen or pelvis.   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  No evidence of metastatic adenopathy in the chest or evidence of other metastatic disease in the abdomen or pelvis.  On 02/27/2024, Derrick Johnson performed flexible video fiberoptic bronchoscopy with robotic assistance and biopsies of right upper lobe lung nodule.  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  Immunostains were positive for CK20 and CDX2, negative for CK7 and TTF-1, consistent with a colorectal primary.  cTx,cNx,pM1a, Stage IV A disease.   All treatment options are palliative in nature, given stage IV disease.  He does have appointment coming up with cardiothoracic surgery Derrick Johnson for possible resection of the lung  lesions.  He will need systemic treatment with FOLFOX plus Avastin -either prior to lung nodule resection or after. (Avastin will be added from cycle 2 onwards).    Oncology History  Adenocarcinoma of rectum (HCC)  11/07/2019 Initial Diagnosis   Adenocarcinoma of rectum (HCC)   03/12/2024 Cancer Staging   Staging form: Colon and Rectum, AJCC 8th Edition - Clinical stage from 03/12/2024: Stage IVA (rcTX, rcNX, rpM1a) - Signed by Autumn Millman, MD on 03/12/2024 Stage prefix: Recurrence   03/21/2024 -  Chemotherapy   Patient is on Treatment Plan : COLORECTAL FOLFOX + Bevacizumab q14d         REVIEW OF SYSTEMS:   Review of Systems - Oncology  All other pertinent systems were reviewed with the patient and are negative.  ALLERGIES: He is allergic to latex.  MEDICATIONS:  Current Outpatient Medications  Medication Sig Dispense Refill   albuterol  (PROVENTIL ) (2.5 MG/3ML) 0.083% nebulizer solution Take 2.5 mg by nebulization every 6 (six) hours as needed for wheezing or shortness of breath.      albuterol  (VENTOLIN  HFA) 108 (90 Base) MCG/ACT inhaler Inhale 1-2 puffs into the lungs every 6 (six) hours as needed for wheezing or shortness of breath. 1 Inhaler 5   aspirin  81 MG chewable tablet Chew 1 tablet (81 mg total) by mouth daily. 90 tablet 3   atorvastatin  (LIPITOR) 80 MG tablet Take 1 tablet (80 mg total) by mouth daily. 90 tablet 3  clopidogrel  (PLAVIX ) 75 MG tablet Take 1 tablet (75 mg total) by mouth daily. Okay to restart this medication on 02/28/2024.     cyclobenzaprine (FLEXERIL) 10 MG tablet Take 10 mg by mouth every 8 (eight) hours as needed for muscle spasms.     furosemide  (LASIX ) 20 MG tablet Take 1 tablet (20 mg total) by mouth daily. 90 tablet 3   methadone  (DOLOPHINE ) 10 MG tablet Take 1 tablet (10 mg total) by mouth in the morning, at noon, in the evening, and at bedtime.     methylphenidate (RITALIN) 20 MG tablet Take 20 mg by mouth 3 (three) times daily.     metoprolol   succinate (TOPROL -XL) 25 MG 24 hr tablet Take 1 tablet (25 mg total) by mouth at bedtime.     Multiple Vitamin (MULTIVITAMIN) capsule Take 1 capsule by mouth daily.     naloxegol oxalate  (MOVANTIK ) 25 MG TABS tablet Take 25 mg by mouth daily.     nitroGLYCERIN  (NITROSTAT ) 0.4 MG SL tablet Place 1 tablet (0.4 mg total) under the tongue every 5 (five) minutes x 3 doses as needed for chest pain. 25 tablet 3   polyethylene glycol (MIRALAX) 17 g packet Take 17 g by mouth daily.     sacubitril-valsartan (ENTRESTO ) 24-26 MG Take 1 tablet by mouth 2 (two) times daily. 60 tablet 11   spironolactone  (ALDACTONE ) 25 MG tablet Take 0.5 tablets (12.5 mg total) by mouth daily. 45 tablet 3   SUMAtriptan  (IMITREX ) 100 MG tablet Take 100 mg by mouth every 2 (two) hours as needed for migraine.     tamsulosin (FLOMAX) 0.4 MG CAPS capsule Take 0.4-0.8 mg by mouth See admin instructions. Take 0.4mg  (1 capsule) by mouth daily for 2 weeks, then increase to 0.8mg  (2 capsules) daily,     traZODone  (DESYREL ) 150 MG tablet Take 0.5 tablets (75 mg total) by mouth at bedtime as needed for sleep.     VITAMIN D -VITAMIN K PO Take 1 capsule by mouth daily. 10,000IU/ 100 mcg     Wheat Dextrin (BENEFIBER PO) Take 1 tablet by mouth 3 (three) times daily as needed (Constipation per wating report).     No current facility-administered medications for this visit.   Facility-Administered Medications Ordered in Other Visits  Medication Dose Route Frequency Provider Last Rate Last Admin   clindamycin  (CLEOCIN ) 900 mg in dextrose  5 % 50 mL IVPB  900 mg Intravenous 60 min Pre-Op Derrick Hila, MD       And   gentamicin  (GARAMYCIN ) 5 mg/kg in dextrose  5 % 50 mL IVPB  5 mg/kg Intravenous 60 min Pre-Op Derrick Hila, MD         VITALS:   Blood pressure 135/89, pulse 67, temperature 98.7 F (37.1 C), resp. rate 18, height 5' 9 (1.753 m), weight 218 lb 14.4 oz (99.3 kg), SpO2 98%.  Wt Readings from Last 3 Encounters:  03/12/24 218 lb  14.4 oz (99.3 kg)  03/04/24 220 lb 12.8 oz (100.2 kg)  02/27/24 219 lb (99.3 kg)    Body mass index is 32.33 kg/m.    Onc Performance Status - 03/12/24 1025       ECOG Perf Status   ECOG Perf Status Restricted in physically strenuous activity but ambulatory and able to carry out work of a light or sedentary nature, e.g., light house work, office work      KPS SCALE   KPS % SCORE Cares for self, unable to carry on normal activity or to do active  work          PHYSICAL EXAM:   Physical Exam Constitutional:      General: He is not in acute distress.    Appearance: Normal appearance.  HENT:     Head: Normocephalic and atraumatic.  Eyes:     Conjunctiva/sclera: Conjunctivae normal.  Cardiovascular:     Rate and Rhythm: Normal rate and regular rhythm.  Pulmonary:     Effort: Pulmonary effort is normal. No respiratory distress.  Abdominal:     General: There is no distension.  Neurological:     General: No focal deficit present.     Mental Status: He is alert and oriented to person, place, and time.  Psychiatric:        Mood and Affect: Mood normal.        Behavior: Behavior normal.        LABORATORY DATA:   I have reviewed the data as listed.  No results found for any visits on 03/12/24.    RADIOGRAPHIC STUDIES:  I have personally reviewed the radiological images as listed and agree with the findings in the report.  NM PET Image Initial (PI) Skull Base To Thigh Result Date: 02/27/2024 CLINICAL DATA:  Initial treatment strategy for pulmonary nodules. History of rectal carcinoma. Radiation therapy 2021. EXAM: NUCLEAR MEDICINE PET SKULL BASE TO THIGH TECHNIQUE: 12.1 mCi F-18 FDG was injected intravenously. Full-ring PET imaging was performed from the skull base to thigh after the radiotracer. CT data was obtained and used for attenuation correction and anatomic localization. Fasting blood glucose: 127 mg/dl COMPARISON:  CT 93/94/7974 FINDINGS: NECK: No  hypermetabolic lymph nodes in the neck. Incidental CT findings: None. CHEST: Rounded RIGHT apical nodule measuring 14 mm (image 42) has intense hypermetabolic activity with SUV max equal 9.4. Similar nodule in the medial LEFT upper lobe measuring 13 mm intense metabolic activity (image 50). Third hypoechoic nodule in the LEFT upper lobe measures 11 mm on image 2. No hypermetabolic mediastinal lymph nodes. Incidental CT findings: None. ABDOMEN/PELVIS: No abnormal hypermetabolic activity within the liver, pancreas, adrenal glands, or spleen. No hypermetabolic lymph nodes in the abdomen or pelvis. No abnormal metabolic activity in the rectum or colon. Incidental CT findings: Postcholecystectomy. SKELETON: No focal hypermetabolic activity to suggest skeletal metastasis. Incidental CT findings: None. IMPRESSION: 1. 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 2. No evidence of metastatic adenopathy in the chest. 3. No evidence of metastatic disease in the abdomen pelvis. 4. No evidence of local rectal carcinoma recurrence. Electronically Signed   By: Jackquline Boxer M.D.   On: 02/27/2024 11:59   DG Chest Port 1 View Result Date: 02/27/2024 CLINICAL DATA:  Status post bronchoscopy with biopsy. EXAM: PORTABLE CHEST 1 VIEW COMPARISON:  February 08, 2024. FINDINGS: The heart size and mediastinal contours are within normal limits. No pneumothorax or pleural effusion is noted. No acute consolidative process is noted. The visualized skeletal structures are unremarkable. IMPRESSION: No pneumothorax or pleural effusion status post bronchoscopy with biopsy. Electronically Signed   By: Lynwood Landy Raddle M.D.   On: 02/27/2024 10:06   DG C-ARM BRONCHOSCOPY Result Date: 02/27/2024 C-ARM BRONCHOSCOPY: Fluoroscopy was utilized by the requesting physician.  No radiographic interpretation.   DG C-Arm 1-60 Min-No Report Result Date:  02/27/2024 Fluoroscopy was utilized by the requesting physician.  No radiographic interpretation.      CODE STATUS:  Code Status History     Date Active Date  Inactive Code Status Order ID Comments User Context   11/27/2023 2206 11/29/2023 1747 Full Code [id number]  Cesario Chamorro, MD Inpatient    Questions for Most Recent Historical Code Status (Order [id number])     Question Answer   By: Consent: discussion documented in EHR            Orders Placed This Encounter  Procedures   Consent Attestation for Oncology Treatment    The patient is informed of risks, benefits, side-effects of the prescribed oncology treatment. Potential short term and long term side effects and response rates discussed. After a long discussion, the patient made informed decision to proceed.:   Yes   CBC with Differential (Cancer Center Only)    Standing Status:   Future    Expected Date:   03/21/2024    Expiration Date:   03/21/2025   CMP (Cancer Center only)    Standing Status:   Future    Expected Date:   03/21/2024    Expiration Date:   03/21/2025   Total Protein, Urine dipstick    Standing Status:   Future    Expected Date:   03/21/2024    Expiration Date:   03/21/2025   CBC with Differential (Cancer Center Only)    Standing Status:   Future    Expected Date:   04/04/2024    Expiration Date:   04/04/2025   CMP (Cancer Center only)    Standing Status:   Future    Expected Date:   04/04/2024    Expiration Date:   04/04/2025   CBC with Differential (Cancer Center Only)    Standing Status:   Future    Expected Date:   04/18/2024    Expiration Date:   04/18/2025   CMP (Cancer Center only)    Standing Status:   Future    Expected Date:   04/18/2024    Expiration Date:   04/18/2025   Total Protein, Urine dipstick    Standing Status:   Future    Expected Date:   04/18/2024    Expiration Date:   04/18/2025   PHYSICIAN COMMUNICATION ORDER    UA Protein, Dipstick required prior to every 3rd cycle bevacizumab.    ONCBCN PHYSICIAN COMMUNICATION 1    0 Number of doses of oxaliplatin received at [facility] or outside facility. AP signature of Provider. If patient has received greater than 5 doses of oxaliplatin, the following pre-medications should be ordered: dexamethasone , diphenhydramine, and formulary histamine H2 antagonist. If patient cannot tolerate oral histamine H2 antagonist, IV may be given.     Future Appointments  Date Time Provider Department Center  03/13/2024  1:40 PM GI-315 CT 2 GI-315CT [facility] [facility]  03/14/2024  8:30 AM [facility] NURSE [facility] None  03/14/2024  9:00 AM Dewey Rush, MD [facility] None  03/15/2024  9:40 AM Johnson Linnie KIDD, MD [facility] H&V  03/18/2024 10:30 AM [facility] 4 [facility] H&V  03/25/2024 10:30 AM Wyn Jackee VEAR Raddle., NP [facility] H&V      This document was completed utilizing speech recognition software. Grammatical errors, random word insertions, pronoun errors, and incomplete sentences are an occasional consequence of this system due to software limitations, ambient noise, and hardware issues. Any formal questions or concerns about the content, text or information contained within the body of this dictation should be directly addressed to the provider for clarification.

## 2024-03-12 NOTE — Progress Notes (Signed)
 START ON PATHWAY REGIMEN - Colorectal     A cycle is every 14 days:     Bevacizumab-xxxx      Oxaliplatin      Leucovorin      Fluorouracil      Fluorouracil   **Always confirm dose/schedule in your pharmacy ordering system**  Patient Characteristics: Distant Metastases, Nonsurgical Candidate, Non-KRAS G12C, RAS Mutation Positive/Unknown (BRAF V600 Wild-Type/Unknown), Standard Cytotoxic Therapy, First Line Standard Cytotoxic Therapy, Bevacizumab Eligible, PS = 0,1 Tumor Location: Rectal Therapeutic Status: Distant Metastases Microsatellite/Mismatch Repair Status: MSS/pMMR BRAF Mutation Status: Awaiting Test Results KRAS/NRAS Mutation Status: Awaiting Test Results Preferred Therapy Approach: Standard Cytotoxic Therapy Standard Cytotoxic Line of Therapy: First Line Standard Cytotoxic Therapy ECOG Performance Status: 1 Bevacizumab Eligibility: Eligible Intent of Therapy: Non-Curative / Palliative Intent, Discussed with Patient

## 2024-03-12 NOTE — Assessment & Plan Note (Addendum)
 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  2 small perirectal nodes measuring 3 to 4 mm  Lower EUS 11/13/2019-tumor in the proximal rectum.  Proximal posterolateral rectal mass.  2 abnormal lymph nodes in the perirectal region.  Staged T3N1 by endorectal ultrasound.    Previously he was seen by Dr. Cloretta in our clinic.  He received treatments with Xeloda /radiation 11/25/2019-01/01/2020  He was supposed to undergo surgical resection under the direction of Dr. Debby in June 2021.  Patient apparently had dental infections around that time and needed dental extractions and patient deferred surgery and did not seek follow-up care either with the surgeons or with our department.  He also did not have follow-up appointments with gastroenterology.  His PCP referred him back to his and he reestablished care with us  on 01/30/2024.  CEA was normal at 1.68.  CBCD and CMP were unremarkable.  Plan made for restaging CT scan of the chest, abdomen and pelvis and referral sent to GI for endoscopic evaluation.  Patient cannot get MRIs because of nerve stimulator in place.  On 02/08/2024, restaging CT chest, abdomen and pelvis showed multiple lung nodules, largest 1 measuring 1.9 cm, concerning for pulmonary metastatic disease.  Previously seen circumferential superior rectal mass was no longer distinctly visible.  No evidence of lymphadenopathy or metastatic disease in the abdomen or pelvis.  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  No evidence of metastatic adenopathy in the chest or evidence of other metastatic disease in the abdomen or pelvis.  On 02/27/2024, Dr. Shelah performed flexible  video fiberoptic bronchoscopy with robotic assistance and biopsies of right upper lobe lung nodule.  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  Immunostains were positive for CK20 and CDX2, negative for CK7 and TTF-1, consistent with a colorectal primary.  cTx,cNx,pM1a, Stage IV A disease.   Today I discussed biopsy results with the patient.  Discussed updated staging, prognosis, plan of care, treatment options.  Reviewed NCCN guidelines.  Will submit NGS testing on the specimen.  All treatment options are palliative in nature, given stage IV disease.  He does have appointment coming up with cardiothoracic surgery Dr. Shyrl for possible resection of the lung lesions.  He will need systemic treatment with FOLFOX plus Avastin -either prior to lung nodule resection or after. (Avastin will be added from cycle 2 onwards).   He did complete chemo education today.  Port-A-Cath placement is currently scheduled on 03/20/2024.  Will plan to begin chemotherapy thereafter.  Patient will be seen by another provider during my absence later this month.

## 2024-03-12 NOTE — Progress Notes (Signed)
 PATIENT NAVIGATOR PROGRESS NOTE  Name: Derrick Johnson Date: 03/12/2024 MRN: [id number]  DOB: 04-28-1956   Reason for visit:  F/U appt and treatment plan/pt education  Comments:  Met with Mr and Mrs Milstein after appt with Dr Autumn Scull scheduled for 03/20/24 at [facility], written instructions given  Patient education provided for FOLFOX and Bevacizumab  Given contact number to call with questions Consults with rad onc and CV surgery canceled due to pt with metastatic disease    Time spent counseling/coordinating care: > 60 minutes

## 2024-03-13 ENCOUNTER — Inpatient Hospital Stay
Admission: RE | Admit: 2024-03-13 | Discharge: 2024-03-13 | Disposition: A | Discharge status: Home / Self Care | Source: Ambulatory Visit | Attending: Acute Care | Admitting: Acute Care

## 2024-03-13 ENCOUNTER — Telehealth: Payer: Self-pay | Admitting: Oncology

## 2024-03-13 ENCOUNTER — Other Ambulatory Visit: Payer: Self-pay

## 2024-03-13 DIAGNOSIS — C3490 Malignant neoplasm of unspecified part of unspecified bronchus or lung: Secondary | ICD-10-CM

## 2024-03-13 DIAGNOSIS — C3480 Malignant neoplasm of overlapping sites of unspecified bronchus and lung: Secondary | ICD-10-CM

## 2024-03-13 MED ORDER — IOPAMIDOL (ISOVUE-300) INJECTION 61%
75.0000 mL | Freq: Once | INTRAVENOUS | Status: AC | PRN
  Administered 2024-03-13: 75 mL via INTRAVENOUS

## 2024-03-13 NOTE — Telephone Encounter (Signed)
 Per inbasket message, PT scheduled and Appts confirmed.

## 2024-03-14 ENCOUNTER — Ambulatory Visit

## 2024-03-14 ENCOUNTER — Ambulatory Visit: Admitting: Radiation Oncology

## 2024-03-14 DIAGNOSIS — C20 Malignant neoplasm of rectum: Secondary | ICD-10-CM

## 2024-03-15 ENCOUNTER — Encounter: Payer: Self-pay | Admitting: Thoracic Surgery (Cardiothoracic Vascular Surgery)

## 2024-03-15 ENCOUNTER — Other Ambulatory Visit: Payer: Self-pay

## 2024-03-15 ENCOUNTER — Ambulatory Visit
Attending: Thoracic Surgery (Cardiothoracic Vascular Surgery) | Admitting: Thoracic Surgery (Cardiothoracic Vascular Surgery)

## 2024-03-15 ENCOUNTER — Encounter: Admitting: Thoracic Surgery (Cardiothoracic Vascular Surgery)

## 2024-03-15 VITALS — BP 122/78 | HR 66 | Resp 20 | Ht 69.0 in | Wt 218.0 lb

## 2024-03-15 DIAGNOSIS — R918 Other nonspecific abnormal finding of lung field: Secondary | ICD-10-CM | POA: Insufficient documentation

## 2024-03-15 NOTE — Progress Notes (Signed)
 [address]       [place] [address]             [phone]                    Derrick Johnson [facility] Medical Record #[id number] Date of Birth: 29-Jun-1956  Referring: Derrick Lauraine FALCON, NP Primary Care: Derrick Tanda Mae, MD Primary Cardiologist: Derrick MARLA Red, MD  Chief Complaint:    Chief Complaint  Patient presents with   Lung Cancer    New patient consult, chest CT 6/5, PET 6/20, Bronch 6/24    History of Present Illness:    Derrick Johnson is a 68 y.o. male who presents for surgical evaluation of a pulmonary metastatic disease from rectal cancer.  He previously underwent chemoradiation for a rectal cancer lesion in 2021 but was lost to follow-up.  He subsequently presented this year to reestablish care and on workup was noted to have bilateral pulmonary nodules.  They were biopsied and were consistent with metastatic rectal cancer.  He recently also has been treated for MI with PCI.        Past Medical History:  Diagnosis Date   Asthma    Back contusion    CAD (coronary artery disease) 11/27/2023   s/p PCI   Chronic migraine    Chronic pain    Dissection of cerebral artery (HCC)    Electrocution and nonfatal effects of electric current    Fibromyalgia    GERD (gastroesophageal reflux disease)    in past, no current problem per pt on 02/26/24   Ischemic cardiomyopathy    Pneumonia    Rectal cancer (HCC)    STEMI (ST elevation myocardial infarction) (HCC) 11/27/2023   Stroke [facility])     Past Surgical History:  Procedure Laterality Date   COLONOSCOPY     CORONARY/GRAFT ACUTE MI REVASCULARIZATION N/A 11/27/2023   Procedure: Coronary/Graft Acute MI Revascularization;  Surgeon: Thukkani, Arun K, MD;  Location: [facility] INVASIVE CV LAB;  Service: Cardiovascular;  Laterality: N/A;   CRYO INTERCOSTAL NERVE BLOCK     LEFT HEART CATH AND CORONARY ANGIOGRAPHY N/A 11/27/2023   Procedure: LEFT HEART CATH AND CORONARY ANGIOGRAPHY;  Surgeon: Johnson Derrick MARLA, MD;  Location: [facility] INVASIVE CV LAB;  Service: Cardiovascular;  Laterality: N/A;   maxofacial surgery     ROTATOR CUFF REPAIR Left    SPINAL CORD STIMULATOR IMPLANT     does not work per patient in years   SPINE SURGERY     TMJ ARTHROSCOPY     VIDEO BRONCHOSCOPY WITH ENDOBRONCHIAL NAVIGATION Bilateral 02/27/2024   Procedure: VIDEO BRONCHOSCOPY WITH ENDOBRONCHIAL NAVIGATION;  Surgeon: Shelah Lamar RAMAN, MD;  Location: [facility] ENDOSCOPY;  Service: Pulmonary;  Laterality: Bilateral;    Family History  Problem Relation Age of Onset   Hyperlipidemia Mother    Heart disease Father    Thyroid  disease Neg Hx      Social History   Tobacco Use  Smoking Status Never   Passive exposure: Never  Smokeless Tobacco Never    Social History   Substance and Sexual Activity  Alcohol Use Not Currently     Allergies  Allergen Reactions   Latex Other (See Comments)    Skin irritation    Current Outpatient Medications  Medication Sig Dispense Refill   albuterol  (PROVENTIL ) (2.5 MG/3ML) 0.083% nebulizer solution Take 2.5 mg by nebulization every 6 (six) hours as needed for wheezing or shortness  of breath.      albuterol  (VENTOLIN  HFA) 108 (90 Base) MCG/ACT inhaler Inhale 1-2 puffs into the lungs every 6 (six) hours as needed for wheezing or shortness of breath. 1 Inhaler 5   aspirin  81 MG chewable tablet Chew 1 tablet (81 mg total) by mouth daily. 90 tablet 3   atorvastatin  (LIPITOR) 80 MG tablet Take 1 tablet (80 mg total) by mouth daily. 90 tablet 3   clopidogrel  (PLAVIX ) 75 MG tablet Take 1 tablet (75 mg total) by mouth daily. Okay to restart this medication on 02/28/2024.     cyclobenzaprine (FLEXERIL) 10 MG tablet Take 10 mg by mouth every 8 (eight) hours as needed for muscle spasms.     dexamethasone  (DECADRON ) 4 MG tablet Take 2 tablets (8 mg total) by mouth daily. Start the day after chemotherapy for 2 days. Take with food. 30 tablet 1   furosemide  (LASIX ) 20 MG tablet Take 1 tablet (20  mg total) by mouth daily. 90 tablet 3   lidocaine -prilocaine  (EMLA ) cream Apply to affected area once 30 g 3   methadone  (DOLOPHINE ) 10 MG tablet Take 1 tablet (10 mg total) by mouth in the morning, at noon, in the evening, and at bedtime.     methylphenidate (RITALIN) 20 MG tablet Take 20 mg by mouth 3 (three) times daily.     metoprolol  succinate (TOPROL -XL) 25 MG 24 hr tablet Take 1 tablet (25 mg total) by mouth at bedtime.     Multiple Vitamin (MULTIVITAMIN) capsule Take 1 capsule by mouth daily.     nitroGLYCERIN  (NITROSTAT ) 0.4 MG SL tablet Place 1 tablet (0.4 mg total) under the tongue every 5 (five) minutes x 3 doses as needed for chest pain. 25 tablet 3   ondansetron  (ZOFRAN ) 8 MG tablet Take 1 tablet (8 mg total) by mouth every 8 (eight) hours as needed for nausea or vomiting. Start on the third day after chemotherapy. 30 tablet 1   polyethylene glycol (MIRALAX) 17 g packet Take 17 g by mouth daily.     prochlorperazine  (COMPAZINE ) 10 MG tablet Take 1 tablet (10 mg total) by mouth every 6 (six) hours as needed for nausea or vomiting. 30 tablet 1   sacubitril-valsartan (ENTRESTO ) 24-26 MG Take 1 tablet by mouth 2 (two) times daily. 60 tablet 11   spironolactone  (ALDACTONE ) 25 MG tablet Take 0.5 tablets (12.5 mg total) by mouth daily. 45 tablet 3   SUMAtriptan  (IMITREX ) 100 MG tablet Take 100 mg by mouth every 2 (two) hours as needed for migraine.     tamsulosin (FLOMAX) 0.4 MG CAPS capsule Take 0.4-0.8 mg by mouth See admin instructions. Take 0.4mg  (1 capsule) by mouth daily for 2 weeks, then increase to 0.8mg  (2 capsules) daily,     traZODone  (DESYREL ) 150 MG tablet Take 0.5 tablets (75 mg total) by mouth at bedtime as needed for sleep.     VITAMIN D -VITAMIN K PO Take 1 capsule by mouth daily. 10,000IU/ 100 mcg     Wheat Dextrin (BENEFIBER PO) Take 1 tablet by mouth 3 (three) times daily as needed (Constipation per wating report).     naloxegol oxalate  (MOVANTIK ) 25 MG TABS tablet Take 25  mg by mouth daily.     No current facility-administered medications for this visit.   Facility-Administered Medications Ordered in Other Visits  Medication Dose Route Frequency Provider Last Rate Last Admin   clindamycin  (CLEOCIN ) 900 mg in dextrose  5 % 50 mL IVPB  900 mg Intravenous 60 min Pre-Op  Debby Hila, MD       And   gentamicin  (GARAMYCIN ) 5 mg/kg in dextrose  5 % 50 mL IVPB  5 mg/kg Intravenous 60 min Pre-Op Debby Hila, MD        Review of Systems  Respiratory:  Negative for shortness of breath.   Cardiovascular:  Negative for chest pain.     PHYSICAL EXAMINATION: BP 122/78 (BP Location: Right Arm, Patient Position: Sitting, Cuff Size: Normal)   Pulse 66   Resp 20   Ht 5' 9 (1.753 m)   Wt 218 lb (98.9 kg)   SpO2 98% Comment: RA  BMI 32.19 kg/m  Physical Exam Constitutional:      General: He is not in acute distress.    Appearance: He is not ill-appearing.  HENT:     Head: Normocephalic and atraumatic.  Eyes:     Extraocular Movements: Extraocular movements intact.  Cardiovascular:     Rate and Rhythm: Normal rate.  Pulmonary:     Effort: Pulmonary effort is normal. No respiratory distress.  Musculoskeletal:        General: Normal range of motion.     Cervical back: Normal range of motion.  Skin:    General: Skin is warm and dry.  Neurological:     General: No focal deficit present.     Mental Status: He is alert and oriented to person, place, and time.          I have independently reviewed the above radiology studies  and reviewed the findings with the patient.   Recent Lab Findings: Lab Results  Component Value Date   WBC 7.9 01/30/2024   HGB 17.2 (H) 01/30/2024   HCT 51.4 01/30/2024   PLT 250 01/30/2024   GLUCOSE 115 (H) 01/30/2024   CHOL 190 11/28/2023   TRIG 232 (H) 11/28/2023   HDL 41 11/28/2023   LDLCALC 103 (H) 11/28/2023   ALT 29 01/30/2024   AST 31 01/30/2024   NA 138 01/30/2024   K 4.6 01/30/2024   CL 100 01/30/2024    CREATININE 1.15 01/30/2024   BUN 6 (L) 01/30/2024   CO2 26 01/30/2024   TSH 1.670 04/03/2019   INR 1.1 11/27/2023   HGBA1C 5.4 04/03/2019    Diagnostic Studies & Laboratory data:     Recent Radiology Findings:   CT HEAD W & WO CONTRAST ( ) Result Date: 03/14/2024 CLINICAL DATA:  Non-small cell lung cancer.  Staging. EXAM: CT HEAD WITHOUT AND WITH CONTRAST TECHNIQUE: Contiguous axial images were obtained from the base of the skull through the vertex without and with intravenous contrast. RADIATION DOSE REDUCTION: This exam was performed according to the departmental dose-optimization program which includes automated exposure control, adjustment of the mA and/or kV according to patient size and/or use of iterative reconstruction technique. CONTRAST:  75mL ISOVUE -300 IOPAMIDOL  (ISOVUE -300) INJECTION 61% COMPARISON:  04/04/2004 FINDINGS: Brain: There is an old small vessel infarction in the inferior right cerebellum. The supra tentorial brain shows a normal appearance without evidence of malformation, atrophy, old or acute small or large vessel infarction, mass lesion, hemorrhage, hydrocephalus or extra-axial collection. After contrast administration, no abnormal enhancement occurs. Vascular: No hyperdense vessel. No evidence of atherosclerotic calcification. Skull: Normal.  No traumatic finding.  No focal bone lesion. Sinuses/Orbits: Sinuses are clear. Orbits appear normal. Mastoids are clear. Other: None significant IMPRESSION: No evidence of metastatic disease. 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 Electronically Signed   By: Oneil Officer M.D.   On:  03/14/2024 14:30   NM PET Image Initial (PI) Skull Base To Thigh Result Date: 02/27/2024 CLINICAL DATA:  Initial treatment strategy for pulmonary nodules. History of rectal carcinoma. Radiation therapy 2021. EXAM: NUCLEAR MEDICINE PET SKULL BASE TO THIGH TECHNIQUE: 12.1 mCi F-18 FDG was injected intravenously. Full-ring PET imaging  was performed from the skull base to thigh after the radiotracer. CT data was obtained and used for attenuation correction and anatomic localization. Fasting blood glucose: 127 mg/dl COMPARISON:  CT 93/94/7974 FINDINGS: NECK: No hypermetabolic lymph nodes in the neck. Incidental CT findings: None. CHEST: Rounded RIGHT apical nodule measuring 14 mm (image 42) has intense hypermetabolic activity with SUV max equal 9.4. Similar nodule in the medial LEFT upper lobe measuring 13 mm intense metabolic activity (image 50). Third hypoechoic nodule in the LEFT upper lobe measures 11 mm on image 2. No hypermetabolic mediastinal lymph nodes. Incidental CT findings: None. ABDOMEN/PELVIS: No abnormal hypermetabolic activity within the liver, pancreas, adrenal glands, or spleen. No hypermetabolic lymph nodes in the abdomen or pelvis. No abnormal metabolic activity in the rectum or colon. Incidental CT findings: Postcholecystectomy. SKELETON: No focal hypermetabolic activity to suggest skeletal metastasis. Incidental CT findings: None. IMPRESSION: 1. 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 2. No evidence of metastatic adenopathy in the chest. 3. No evidence of metastatic disease in the abdomen pelvis. 4. No evidence of local rectal carcinoma recurrence. Electronically Signed   By: Jackquline Boxer M.D.   On: 02/27/2024 11:59   DG Chest Port 1 View Result Date: 02/27/2024 CLINICAL DATA:  Status post bronchoscopy with biopsy. EXAM: PORTABLE CHEST 1 VIEW COMPARISON:  February 08, 2024. FINDINGS: The heart size and mediastinal contours are within normal limits. No pneumothorax or pleural effusion is noted. No acute consolidative process is noted. The visualized skeletal structures are unremarkable. IMPRESSION: No pneumothorax or pleural effusion status post bronchoscopy with biopsy. Electronically Signed   By: Lynwood Landy Raddle M.D.   On: 02/27/2024  10:06   DG C-ARM BRONCHOSCOPY Result Date: 02/27/2024 C-ARM BRONCHOSCOPY: Fluoroscopy was utilized by the requesting physician.  No radiographic interpretation.   DG C-Arm 1-60 Min-No Report Result Date: 02/27/2024 Fluoroscopy was utilized by the requesting physician.  No radiographic interpretation.       Assessment / Plan:   68 y.o. male with stage IV rectal cancer with bilateral pulmonary nodules.  He was treated with chemoradiation in 2021 but never followed up for resection.  That being said he likely is out of his radiation window to undergo rectal cancer resection.  If this cannot be resected there is minimal advantage to resect the pulmonary nodules.  He is scheduled to start chemotherapy next week.  If he has any further questions he can follow-up with his oncologist.     I  spent 40 minutes with  the patient face to face in counseling and coordination of care.    Derrick Johnson 03/15/2024 3:44 PM

## 2024-03-16 ENCOUNTER — Other Ambulatory Visit: Payer: Self-pay

## 2024-03-18 ENCOUNTER — Ambulatory Visit (HOSPITAL_COMMUNITY)
Admission: RE | Admit: 2024-03-18 | Discharge: 2024-03-18 | Disposition: A | Discharge status: Home / Self Care | Source: Ambulatory Visit | Attending: Internal Medicine | Admitting: Internal Medicine

## 2024-03-18 ENCOUNTER — Other Ambulatory Visit: Payer: Self-pay | Admitting: Radiology

## 2024-03-18 DIAGNOSIS — I2510 Atherosclerotic heart disease of native coronary artery without angina pectoris: Secondary | ICD-10-CM | POA: Insufficient documentation

## 2024-03-18 DIAGNOSIS — I10 Essential (primary) hypertension: Secondary | ICD-10-CM | POA: Insufficient documentation

## 2024-03-18 DIAGNOSIS — E782 Mixed hyperlipidemia: Secondary | ICD-10-CM | POA: Diagnosis present

## 2024-03-18 DIAGNOSIS — I639 Cerebral infarction, unspecified: Secondary | ICD-10-CM | POA: Diagnosis present

## 2024-03-18 DIAGNOSIS — I5022 Chronic systolic (congestive) heart failure: Secondary | ICD-10-CM | POA: Insufficient documentation

## 2024-03-18 DIAGNOSIS — I255 Ischemic cardiomyopathy: Secondary | ICD-10-CM | POA: Diagnosis not present

## 2024-03-18 LAB — ECHOCARDIOGRAM COMPLETE
Area-P 1/2: 2.2 cm2
S' Lateral: 3.1 cm

## 2024-03-18 NOTE — H&P (Signed)
 Chief Complaint: Recurrent metastatic rectal cancer; referred for port a cath placement to assist with treatment  Referring Provider(s): Pasam,A  Supervising Physician: Johann Sieving  Patient Status: [facility] - Out-pt  History of Present Illness: Derrick Johnson is a 68 y.o. 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  Patient was treated with concurrent chemoradiation and completed treatment in April 2021 ,however did not follow-up with surgery or oncology.  He was recently referred back to oncology by his PCP to reestablish care for his history of rectal cancer.  He now has recurrence of disease with biopsy-proven lung mets.  He is scheduled today for Port-A-Cath placement to assist with treatment.  *** Patient is Full Code  Past Medical History:  Diagnosis Date   Asthma    Back contusion    CAD (coronary artery disease) 11/27/2023   s/p PCI   Chronic migraine    Chronic pain    Dissection of cerebral artery (HCC)    Electrocution and nonfatal effects of electric current    Fibromyalgia    GERD (gastroesophageal reflux disease)    in past, no current problem per pt on 02/26/24   Ischemic cardiomyopathy    Pneumonia    Rectal cancer (HCC)    STEMI (ST elevation myocardial infarction) (HCC) 11/27/2023   Stroke [facility])     Past Surgical History:  Procedure Laterality Date   COLONOSCOPY     CORONARY/GRAFT ACUTE MI REVASCULARIZATION N/A 11/27/2023   Procedure: Coronary/Graft Acute MI Revascularization;  Surgeon: Thukkani, Arun K, MD;  Location: [facility] INVASIVE CV LAB;  Service: Cardiovascular;  Laterality: N/A;   CRYO INTERCOSTAL NERVE BLOCK     LEFT HEART CATH AND CORONARY ANGIOGRAPHY N/A 11/27/2023   Procedure:  LEFT HEART CATH AND CORONARY ANGIOGRAPHY;  Surgeon: Wendel Lurena POUR, MD;  Location: [facility] INVASIVE CV LAB;  Service: Cardiovascular;  Laterality: N/A;   maxofacial surgery     ROTATOR CUFF REPAIR Left    SPINAL CORD STIMULATOR IMPLANT     does not work per patient in years   SPINE SURGERY     TMJ ARTHROSCOPY     VIDEO BRONCHOSCOPY WITH ENDOBRONCHIAL NAVIGATION Bilateral 02/27/2024   Procedure: VIDEO BRONCHOSCOPY WITH ENDOBRONCHIAL NAVIGATION;  Surgeon: Shelah Lamar RAMAN, MD;  Location: [facility] ENDOSCOPY;  Service: Pulmonary;  Laterality: Bilateral;    Allergies: Latex  Medications: Prior to Admission medications   Medication Sig Start Date End Date Taking? Authorizing Provider  albuterol  (PROVENTIL ) (2.5 MG/3ML) 0.083% nebulizer solution Take 2.5 mg by nebulization every 6 (six) hours as needed for wheezing or shortness of breath.  11/07/19   [provider]  albuterol  (VENTOLIN  HFA) 108 (90 Base) MCG/ACT inhaler Inhale 1-2 puffs into the lungs every 6 (six) hours as needed for wheezing or shortness of breath. 03/28/17   Babs Arthea DASEN, MD  aspirin  81 MG chewable tablet Chew 1 tablet (81 mg total) by mouth daily. 12/25/23   Wyn Jackee VEAR Mickey., NP  atorvastatin  (LIPITOR) 80 MG tablet Take 1 tablet (80 mg total) by mouth daily. 12/25/23   Wyn Jackee VEAR Mickey., NP  clopidogrel  (PLAVIX ) 75 MG tablet Take 1 tablet (75 mg total) by mouth daily. Okay to restart this medication on 02/28/2024. 02/27/24   Shelah Lamar RAMAN, MD  cyclobenzaprine (FLEXERIL) 10 MG tablet  Take 10 mg by mouth every 8 (eight) hours as needed for muscle spasms. 11/10/23   [provider]  dexamethasone  (DECADRON ) 4 MG tablet Take 2 tablets (8 mg total) by mouth daily. Start the day after chemotherapy for 2 days. Take with food. 03/12/24   Pasam, Chinita, MD  furosemide  (LASIX ) 20 MG tablet Take 1 tablet (20 mg total) by mouth daily. 12/08/23   Hayes Beckey CROME, NP  lidocaine -prilocaine  (EMLA ) cream Apply to affected area once 03/12/24    Pasam, Avinash, MD  methadone  (DOLOPHINE ) 10 MG tablet Take 1 tablet (10 mg total) by mouth in the morning, at noon, in the evening, and at bedtime. 02/27/24   Shelah Lamar RAMAN, MD  methylphenidate (RITALIN) 20 MG tablet Take 20 mg by mouth 3 (three) times daily. 10/09/23   [provider]  metoprolol  succinate (TOPROL -XL) 25 MG 24 hr tablet Take 1 tablet (25 mg total) by mouth at bedtime. 12/08/23   Hayes Beckey CROME, NP  Multiple Vitamin (MULTIVITAMIN) capsule Take 1 capsule by mouth daily.    [provider]  naloxegol oxalate  (MOVANTIK ) 25 MG TABS tablet Take 25 mg by mouth daily.    [provider]  nitroGLYCERIN  (NITROSTAT ) 0.4 MG SL tablet Place 1 tablet (0.4 mg total) under the tongue every 5 (five) minutes x 3 doses as needed for chest pain. 12/25/23   Wyn Jackee VEAR Mickey., NP  ondansetron  (ZOFRAN ) 8 MG tablet Take 1 tablet (8 mg total) by mouth every 8 (eight) hours as needed for nausea or vomiting. Start on the third day after chemotherapy. 03/12/24   Pasam, Chinita, MD  polyethylene glycol (MIRALAX) 17 g packet Take 17 g by mouth daily.    [provider]  prochlorperazine  (COMPAZINE ) 10 MG tablet Take 1 tablet (10 mg total) by mouth every 6 (six) hours as needed for nausea or vomiting. 03/12/24   Pasam, Chinita, MD  sacubitril-valsartan (ENTRESTO ) 24-26 MG Take 1 tablet by mouth 2 (two) times daily. 12/22/23   Colletta Manuelita Garre, PA-C  spironolactone  (ALDACTONE ) 25 MG tablet Take 0.5 tablets (12.5 mg total) by mouth daily. 12/08/23   Hayes Beckey CROME, NP  SUMAtriptan  (IMITREX ) 100 MG tablet Take 100 mg by mouth every 2 (two) hours as needed for migraine. 01/04/24   [provider]  tamsulosin (FLOMAX) 0.4 MG CAPS capsule Take 0.4-0.8 mg by mouth See admin instructions. Take 0.4mg  (1 capsule) by mouth daily for 2 weeks, then increase to 0.8mg  (2 capsules) daily, 11/21/23   [provider]  traZODone  (DESYREL ) 150 MG tablet Take 0.5 tablets (75 mg total) by mouth  at bedtime as needed for sleep. 02/27/24   Shelah Lamar RAMAN, MD  VITAMIN D -VITAMIN K PO Take 1 capsule by mouth daily. 10,000IU/ 100 mcg    [provider]  Wheat Dextrin (BENEFIBER PO) Take 1 tablet by mouth 3 (three) times daily as needed (Constipation per wating report).    [provider]     Family History  Problem Relation Age of Onset   Hyperlipidemia Mother    Heart disease Father    Thyroid  disease Neg Hx     Social History   Socioeconomic History   Marital status: Married    Spouse name: [name]   Number of children: 0   Years of education: Not on file   Highest education level: High school graduate  Occupational History   Occupation: Disability  Tobacco Use   Smoking status: Never    Passive exposure:  Never   Smokeless tobacco: Never  Vaping Use   Vaping status: Never Used  Substance and Sexual Activity   Alcohol use: Not Currently   Drug use: No   Sexual activity: Yes  Other Topics Concern   Not on file  Social History Narrative   Not on file   Social Drivers of Health   Financial Resource Strain: Low Risk  (11/29/2023)   Overall Financial Resource Strain (CARDIA)    Difficulty of Paying Living Expenses: Not very hard  Food Insecurity: No Food Insecurity (01/30/2024)   Hunger Vital Sign    Worried About Running Out of Food in the Last Year: Never true    Ran Out of Food in the Last Year: Never true  Transportation Needs: No Transportation Needs (01/30/2024)   PRAPARE - Administrator, Civil Service (Medical): No    Lack of Transportation (Non-Medical): No  Physical Activity: Not on file  Stress: Not on file  Social Connections: Unknown (11/28/2023)   Social Connection and Isolation Panel    Frequency of Communication with Friends and Family: Three times a week    Frequency of Social Gatherings with Friends and Family: Three times a week    Attends Religious Services: Patient declined    Active Member of Clubs or Organizations:  Patient declined    Attends Banker Meetings: Patient declined    Marital Status: Married       Review of Systems  Vital Signs:   Advance Care Plan: no documents on file  Physical Exam  Imaging: ECHOCARDIOGRAM COMPLETE Result Date: 03/18/2024    ECHOCARDIOGRAM REPORT   Patient Name:   ROHAAN DURNIL [facility]  Date of Exam: 03/18/2024 Medical Rec #:  [id number]     Height:       69.0 in Accession #:    [id number]    Weight:       218.0 lb Date of Birth:  [birth year]     BSA:          2.143 m Patient Age:    68 years      BP:           131/87 mmHg Patient Gender: M             HR:           57 bpm. Exam Location:  [address] Procedure: 2D Echo, 3D Echo, Cardiac Doppler, Color Doppler and Strain Analysis            (Both Spectral and Color Flow Doppler were utilized during            procedure). Indications:    I25.10 CAD  History:        Patient has prior history of Echocardiogram examinations, most                 recent 11/28/2023. Ischemic Cardiomyopathy, STEMI; Risk                 Factors:Dyslipidemia.  Sonographer:    Waldo Guadalajara RCS Referring Phys: [id number] JACKEE DEL, JR DICK IMPRESSIONS  1. Left ventricular ejection fraction, by estimation, is 60 to 65%. Left ventricular ejection fraction by 3D volume is 58 %. The left ventricle has normal function. The left ventricle has no regional wall motion abnormalities. There is mild concentric left ventricular hypertrophy. Left ventricular diastolic parameters were normal. The average left ventricular global longitudinal strain is -19.1 %. The global longitudinal strain is normal.  2. Right ventricular  systolic function is normal. The right ventricular size is normal. There is normal pulmonary artery systolic pressure.  3. The mitral valve is normal in structure. No evidence of mitral valve regurgitation. No evidence of mitral stenosis.  4. The aortic valve is normal in structure. Aortic valve regurgitation is not visualized. No aortic stenosis is  present.  5. The inferior vena cava is normal in size with greater than 50% respiratory variability, suggesting right atrial pressure of 3 mmHg. FINDINGS  Left Ventricle: Left ventricular ejection fraction, by estimation, is 60 to 65%. Left ventricular ejection fraction by 3D volume is 58 %. The left ventricle has normal function. The left ventricle has no regional wall motion abnormalities. The average left ventricular global longitudinal strain is -19.1 %. Strain was performed and the global longitudinal strain is normal. The left ventricular internal cavity size was normal in size. There is mild concentric left ventricular hypertrophy. Left ventricular diastolic parameters were normal. Right Ventricle: The right ventricular size is normal. No increase in right ventricular wall thickness. Right ventricular systolic function is normal. There is normal pulmonary artery systolic pressure. The tricuspid regurgitant velocity is 1.92 m/s, and  with an assumed right atrial pressure of 3 mmHg, the estimated right ventricular systolic pressure is 17.7 mmHg. Left Atrium: Left atrial size was normal in size. Right Atrium: Right atrial size was normal in size. Pericardium: There is no evidence of pericardial effusion. Mitral Valve: The mitral valve is normal in structure. No evidence of mitral valve regurgitation. No evidence of mitral valve stenosis. Tricuspid Valve: The tricuspid valve is normal in structure. Tricuspid valve regurgitation is not demonstrated. No evidence of tricuspid stenosis. Aortic Valve: The aortic valve is normal in structure. Aortic valve regurgitation is not visualized. No aortic stenosis is present. Pulmonic Valve: The pulmonic valve was normal in structure. Pulmonic valve regurgitation is not visualized. No evidence of pulmonic stenosis. Aorta: The aortic root is normal in size and structure. Venous: The inferior vena cava is normal in size with greater than 50% respiratory variability, suggesting  right atrial pressure of 3 mmHg. IAS/Shunts: No atrial level shunt detected by color flow Doppler. Additional Comments: 3D was performed not requiring image post processing on an independent workstation and was normal.  LEFT VENTRICLE PLAX 2D LVIDd:         4.40 cm         Diastology LVIDs:         3.10 cm         LV e' medial:    9.90 cm/s LV PW:         1.30 cm         LV E/e' medial:  7.0 LV IVS:        1.40 cm         LV e' lateral:   10.90 cm/s LVOT diam:     2.10 cm         LV E/e' lateral: 6.3 LV SV:         58 LV SV Index:   27              2D Longitudinal LVOT Area:     3.46 cm        Strain                                2D Strain GLS   -19.1 %  Avg:                                 3D Volume EF                                LV 3D EF:    Left                                             ventricul                                             ar                                             ejection                                             fraction                                             by 3D                                             volume is                                             58 %.                                 3D Volume EF:                                3D EF:        58 %                                LV EDV:       107 ml                                LV ESV:       45 ml                                LV SV:        63 ml RIGHT VENTRICLE RV Basal diam:  3.90  cm RV S prime:     15.00 cm/s TAPSE (M-mode): 2.1 cm RVSP:           17.7 mmHg LEFT ATRIUM             Index        RIGHT ATRIUM           Index LA diam:        4.20 cm 1.96 cm/m   RA Pressure: 3.00 mmHg LA Vol (A2C):   33.2 ml 15.49 ml/m  RA Area:     13.60 cm LA Vol (A4C):   29.1 ml 13.58 ml/m  RA Volume:   35.40 ml  16.52 ml/m LA Biplane Vol: 31.1 ml 14.51 ml/m  AORTIC VALVE LVOT Vmax:   72.70 cm/s LVOT Vmean:  52.700 cm/s LVOT VTI:    0.167 m  AORTA Ao Root diam: 3.60 cm Ao Asc diam:  3.50 cm  MITRAL VALVE               TRICUSPID VALVE MV Area (PHT):             TR Peak grad:   14.7 mmHg MV Decel Time:             TR Vmax:        192.00 cm/s MV E velocity: 69.20 cm/s  Estimated RAP:  3.00 mmHg MV A velocity: 66.00 cm/s  RVSP:           17.7 mmHg MV E/A ratio:  1.05                            SHUNTS                            Systemic VTI:  0.17 m                            Systemic Diam: 2.10 cm Aditya Sabharwal Electronically signed by Ria Commander Signature Date/Time: 03/18/2024/12:15:27 PM    Final    CT HEAD W & WO CONTRAST ( ) Result Date: 03/14/2024 CLINICAL DATA:  Non-small cell lung cancer.  Staging. EXAM: CT HEAD WITHOUT AND WITH CONTRAST TECHNIQUE: Contiguous axial images were obtained from the base of the skull through the vertex without and with intravenous contrast. RADIATION DOSE REDUCTION: This exam was performed according to the departmental dose-optimization program which includes automated exposure control, adjustment of the mA and/or kV according to patient size and/or use of iterative reconstruction technique. CONTRAST:  75mL ISOVUE -300 IOPAMIDOL  (ISOVUE -300) INJECTION 61% COMPARISON:  04/04/2004 FINDINGS: Brain: There is an old small vessel infarction in the inferior right cerebellum. The supra tentorial brain shows a normal appearance without evidence of malformation, atrophy, old or acute small or large vessel infarction, mass lesion, hemorrhage, hydrocephalus or extra-axial collection. After contrast administration, no abnormal enhancement occurs. Vascular: No hyperdense vessel. No evidence of atherosclerotic calcification. Skull: Normal.  No traumatic finding.  No focal bone lesion. Sinuses/Orbits: Sinuses are clear. Orbits appear normal. Mastoids are clear. Other: None significant IMPRESSION: No evidence of metastatic disease. 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 Electronically Signed   By: Oneil Officer M.D.   On: 03/14/2024 14:30   NM PET Image  Initial (PI) Skull Base To Thigh Result Date: 02/27/2024 CLINICAL DATA:  Initial treatment strategy for pulmonary nodules. History of rectal carcinoma.  Radiation therapy 2021. EXAM: NUCLEAR MEDICINE PET SKULL BASE TO THIGH TECHNIQUE: 12.1 mCi F-18 FDG was injected intravenously. Full-ring PET imaging was performed from the skull base to thigh after the radiotracer. CT data was obtained and used for attenuation correction and anatomic localization. Fasting blood glucose: 127 mg/dl COMPARISON:  CT 93/94/7974 FINDINGS: NECK: No hypermetabolic lymph nodes in the neck. Incidental CT findings: None. CHEST: Rounded RIGHT apical nodule measuring 14 mm (image 42) has intense hypermetabolic activity with SUV max equal 9.4. Similar nodule in the medial LEFT upper lobe measuring 13 mm intense metabolic activity (image 50). Third hypoechoic nodule in the LEFT upper lobe measures 11 mm on image 2. No hypermetabolic mediastinal lymph nodes. Incidental CT findings: None. ABDOMEN/PELVIS: No abnormal hypermetabolic activity within the liver, pancreas, adrenal glands, or spleen. No hypermetabolic lymph nodes in the abdomen or pelvis. No abnormal metabolic activity in the rectum or colon. Incidental CT findings: Postcholecystectomy. SKELETON: No focal hypermetabolic activity to suggest skeletal metastasis. Incidental CT findings: None. IMPRESSION: 1. 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 2. No evidence of metastatic adenopathy in the chest. 3. No evidence of metastatic disease in the abdomen pelvis. 4. No evidence of local rectal carcinoma recurrence. Electronically Signed   By: Jackquline Boxer M.D.   On: 02/27/2024 11:59   DG Chest Port 1 View Result Date: 02/27/2024 CLINICAL DATA:  Status post bronchoscopy with biopsy. EXAM: PORTABLE CHEST 1 VIEW COMPARISON:  February 08, 2024. FINDINGS: The heart size and mediastinal contours are within  normal limits. No pneumothorax or pleural effusion is noted. No acute consolidative process is noted. The visualized skeletal structures are unremarkable. IMPRESSION: No pneumothorax or pleural effusion status post bronchoscopy with biopsy. Electronically Signed   By: Lynwood Landy Raddle M.D.   On: 02/27/2024 10:06   DG C-ARM BRONCHOSCOPY Result Date: 02/27/2024 C-ARM BRONCHOSCOPY: Fluoroscopy was utilized by the requesting physician.  No radiographic interpretation.   DG C-Arm 1-60 Min-No Report Result Date: 02/27/2024 Fluoroscopy was utilized by the requesting physician.  No radiographic interpretation.    Labs:  CBC: Recent Labs    11/27/23 2113 11/27/23 2121 11/28/23 0227 11/29/23 0802 01/30/24 1357  WBC 9.2  --  9.3 9.6 7.9  HGB 15.6 15.3 15.2 14.5 17.2*  HCT 44.6 45.0 44.4 42.3 51.4  PLT 233  --  215 210 250    COAGS: Recent Labs    11/27/23 2324  INR 1.1    BMP: Recent Labs    12/08/23 0938 12/15/23 0900 12/22/23 1027 01/30/24 1357  NA 136 138 139 138  K 3.9 4.5 4.7 4.6  CL 100 100 103 100  CO2 26 27 27 26   GLUCOSE 121* 110* 119* 115*  BUN 5* 6* 10 6*  CALCIUM  8.8* 9.5 9.6 10.1  CREATININE 0.97 1.03 1.12 1.15  GFRNONAA >60 >60 >60 >60    LIVER FUNCTION TESTS: Recent Labs    11/27/23 2113 01/30/24 1357  BILITOT 0.5 0.4  AST 47* 31  ALT 60* 29  ALKPHOS 69 95  PROT 6.7 7.9  ALBUMIN 3.6 4.6    TUMOR MARKERS: Recent Labs    01/30/24 1357  CEA 1.68    Assessment and Plan: 69 y.o. male with past medical history significant for asthma, coronary artery disease with prior PCI/MI, migraines, dissection of cerebral artery, fibromyalgia, GERD, ischemic cardiomyopathy, prior right cerebellar infarct, chronic arthralgias status post spinal cord stimulator, and rectal cancer diagnosed in  February 2021.  Patient was treated with concurrent chemoradiation and completed treatment in April 2021 ,however did not follow-up with surgery or oncology.  He was recently  referred back to oncology by his PCP to reestablish care for his history of rectal cancer.  He now has recurrence of disease with biopsy-proven lung mets.  He is scheduled today for Port-A-Cath placement to assist with treatment.Risks and benefits of image guided port-a-catheter placement was discussed with the patient including, but not limited to bleeding, infection, pneumothorax, or fibrin sheath development and need for additional procedures.  All of the patient's questions were answered, patient is agreeable to proceed. Consent signed and in chart.    Thank you for allowing our service to participate in Derrick Johnson 's care.  Electronically Signed: D. Franky Rakers, PA-C   03/18/2024, 3:58 PM      I spent a total of   20 minutes  in face to face in clinical consultation, greater than 50% of which was counseling/coordinating care for Port-A-Cath placement

## 2024-03-19 ENCOUNTER — Ambulatory Visit: Payer: Self-pay | Admitting: Nurse Practitioner

## 2024-03-20 ENCOUNTER — Telehealth: Payer: Self-pay

## 2024-03-20 ENCOUNTER — Ambulatory Visit (HOSPITAL_COMMUNITY)
Admission: RE | Admit: 2024-03-20 | Discharge: 2024-03-20 | Disposition: A | Discharge status: Home / Self Care | Source: Ambulatory Visit | Attending: Oncology | Admitting: Oncology

## 2024-03-20 ENCOUNTER — Other Ambulatory Visit: Payer: Self-pay

## 2024-03-20 ENCOUNTER — Encounter (HOSPITAL_COMMUNITY): Payer: Self-pay

## 2024-03-20 DIAGNOSIS — Z9221 Personal history of antineoplastic chemotherapy: Secondary | ICD-10-CM | POA: Diagnosis not present

## 2024-03-20 DIAGNOSIS — M797 Fibromyalgia: Secondary | ICD-10-CM | POA: Insufficient documentation

## 2024-03-20 DIAGNOSIS — J45909 Unspecified asthma, uncomplicated: Secondary | ICD-10-CM | POA: Diagnosis not present

## 2024-03-20 DIAGNOSIS — Z923 Personal history of irradiation: Secondary | ICD-10-CM | POA: Insufficient documentation

## 2024-03-20 DIAGNOSIS — C20 Malignant neoplasm of rectum: Secondary | ICD-10-CM | POA: Diagnosis present

## 2024-03-20 DIAGNOSIS — I255 Ischemic cardiomyopathy: Secondary | ICD-10-CM | POA: Diagnosis not present

## 2024-03-20 DIAGNOSIS — I252 Old myocardial infarction: Secondary | ICD-10-CM | POA: Insufficient documentation

## 2024-03-20 DIAGNOSIS — Z955 Presence of coronary angioplasty implant and graft: Secondary | ICD-10-CM | POA: Insufficient documentation

## 2024-03-20 DIAGNOSIS — Z8673 Personal history of transient ischemic attack (TIA), and cerebral infarction without residual deficits: Secondary | ICD-10-CM | POA: Insufficient documentation

## 2024-03-20 DIAGNOSIS — K219 Gastro-esophageal reflux disease without esophagitis: Secondary | ICD-10-CM | POA: Insufficient documentation

## 2024-03-20 DIAGNOSIS — C7800 Secondary malignant neoplasm of unspecified lung: Secondary | ICD-10-CM | POA: Diagnosis not present

## 2024-03-20 DIAGNOSIS — I2510 Atherosclerotic heart disease of native coronary artery without angina pectoris: Secondary | ICD-10-CM | POA: Diagnosis not present

## 2024-03-20 HISTORY — PX: IR IMAGING GUIDED PORT INSERTION: IMG5740

## 2024-03-20 MED ORDER — MIDAZOLAM HCL 2 MG/2ML IJ SOLN
INTRAMUSCULAR | Status: AC
  Filled 2024-03-20: qty 2

## 2024-03-20 MED ORDER — LIDOCAINE-EPINEPHRINE 1 %-1:100000 IJ SOLN
INTRAMUSCULAR | Status: AC
  Filled 2024-03-20: qty 1

## 2024-03-20 MED ORDER — MIDAZOLAM HCL 2 MG/2ML IJ SOLN
INTRAMUSCULAR | Status: AC | PRN
  Administered 2024-03-20: 1 mg via INTRAVENOUS

## 2024-03-20 MED ORDER — FENTANYL CITRATE (PF) 100 MCG/2ML IJ SOLN
INTRAMUSCULAR | Status: AC | PRN
  Administered 2024-03-20: 50 ug via INTRAVENOUS

## 2024-03-20 MED ORDER — LIDOCAINE-EPINEPHRINE 1 %-1:100000 IJ SOLN
20.0000 mL | Freq: Once | INTRAMUSCULAR | Status: AC
  Administered 2024-03-20: 20 mL via INTRADERMAL

## 2024-03-20 MED ORDER — HEPARIN SOD (PORK) LOCK FLUSH 100 UNIT/ML IV SOLN
500.0000 [IU] | Freq: Once | INTRAVENOUS | Status: AC
  Administered 2024-03-20: 500 [IU] via INTRAVENOUS

## 2024-03-20 MED ORDER — LIDOCAINE-EPINEPHRINE 1 %-1:100000 IJ SOLN
20.0000 mL | Freq: Once | INTRAMUSCULAR | Status: AC
  Administered 2024-03-20: 10 mL via INTRADERMAL

## 2024-03-20 MED ORDER — LIDOCAINE-EPINEPHRINE 1 %-1:100000 IJ SOLN
INTRAMUSCULAR | Status: AC
Start: 2024-03-20 — End: 2024-03-20
  Filled 2024-03-20: qty 1

## 2024-03-20 MED ORDER — FENTANYL CITRATE (PF) 100 MCG/2ML IJ SOLN
INTRAMUSCULAR | Status: AC
  Filled 2024-03-20: qty 2

## 2024-03-20 MED ORDER — SODIUM CHLORIDE 0.9 % IV SOLN: INTRAVENOUS | Status: DC

## 2024-03-20 MED ORDER — HEPARIN SOD (PORK) LOCK FLUSH 100 UNIT/ML IV SOLN
INTRAVENOUS | Status: AC
  Filled 2024-03-20: qty 5

## 2024-03-20 NOTE — Discharge Instructions (Addendum)
Discharge Instructions:   Please call Interventional Radiology clinic [phone] with any questions or concerns.  You may remove your dressing and shower tomorrow.  Do not use EMLA / Lidocaine cream for 2 weeks post Port Insertion this will remove the surgical glue.   Implanted Port Insertion, Care After The following information offers guidance on how to care for yourself after your procedure. Your health care provider may also give you more specific instructions. If you have problems or questions, contact your health care provider. What can I expect after the procedure? After the procedure, it is common to have: Discomfort at the port insertion site. Bruising on the skin over the port. This should improve over 3-4 days. Follow these instructions at home: Port care After your port is placed, you will get a manufacturer's information card. The card has information about your port. Keep this card with you at all times. Take care of the port as told by your health care provider. Ask your health care provider if you or a family member can get training for taking care of the port at home. A home health care nurse will be be available to help care for the port. Make sure to remember what type of port you have. Incision care     Follow instructions from your health care provider about how to take care of your port insertion site. Make sure you: Wash your hands with soap and water for at least 20 seconds before and after you change your bandage (dressing). If soap and water are not available, use hand sanitizer. Change your dressing as told by your health care provider. Leave stitches (sutures), skin glue, or adhesive strips in place. These skin closures may need to stay in place for 2 weeks or longer. If adhesive strip edges start to loosen and curl up, you may trim the loose edges. Do not remove adhesive strips completely unless your health care provider tells you to do that. Check your  port insertion site every day for signs of infection. Check for: Redness, swelling, or pain. Fluid or blood. Warmth. Pus or a bad smell. Activity Return to your normal activities as told by your health care provider. Ask your health care provider what activities are safe for you. You may have to avoid lifting. Ask your health care provider how much you can safely lift. General instructions Take over-the-counter and prescription medicines only as told by your health care provider. Do not take baths, swim, or use a hot tub until your health care provider approves. Ask your health care provider if you may take showers. You may only be allowed to take sponge baths. If you were given a sedative during the procedure, it can affect you for several hours. Do not drive or operate machinery until your health care provider says that it is safe. Wear a medical alert bracelet in case of an emergency. This will tell any health care providers that you have a port. Keep all follow-up visits. This is important. Contact a health care provider if: You cannot flush your port with saline as directed, or you cannot draw blood from the port. You have a fever or chills. You have redness, swelling, or pain around your port insertion site. You have fluid or blood coming from your port insertion site. Your port insertion site feels warm to the touch. You have pus or a bad smell coming from the port insertion site. Get help right away if: You have chest pain or shortness of   breath. You have bleeding from your port that you cannot control. These symptoms may be an emergency. Get help right away. Call 911. Do not wait to see if the symptoms will go away. Do not drive yourself to the hospital. Summary Take care of the port as told by your health care provider. Keep the manufacturer's information card with you at all times. Change your dressing as told by your health care provider. Contact a health care provider if  you have a fever or chills or if you have redness, swelling, or pain around your port insertion site. Keep all follow-up visits. This information is not intended to replace advice given to you by your health care provider. Make sure you discuss any questions you have with your health care provider. Document Revised: 02/23/2021 Document Reviewed: 02/23/2021 Elsevier Patient Education  2023 Elsevier Inc.    Moderate Conscious Sedation, Adult, Care After This sheet gives you information about how to care for yourself after your procedure. Your health care provider may also give you more specific instructions. If you have problems or questions, contact your health care provider. What can I expect after the procedure? After the procedure, it is common to have: Sleepiness for several hours. Impaired judgment for several hours. Difficulty with balance. Vomiting if you eat too soon. Follow these instructions at home: For the time period you were told by your health care provider: Rest. Do not participate in activities where you could fall or become injured. Do not drive or use machinery. Do not drink alcohol. Do not take sleeping pills or medicines that cause drowsiness. Do not make important decisions or sign legal documents. Do not take care of children on your own. Eating and drinking  Follow the diet recommended by your health care provider. Drink enough fluid to keep your urine pale yellow. If you vomit: Drink water, juice, or soup when you can drink without vomiting. Make sure you have little or no nausea before eating solid foods. General instructions Take over-the-counter and prescription medicines only as told by your health care provider. Have a responsible adult stay with you for the time you are told. It is important to have someone help care for you until you are awake and alert. Do not smoke. Keep all follow-up visits as told by your health care provider. This is  important. Contact a health care provider if: You are still sleepy or having trouble with balance after 24 hours. You feel light-headed. You keep feeling nauseous or you keep vomiting. You develop a rash. You have a fever. You have redness or swelling around the IV site. Get help right away if: You have trouble breathing. You have new-onset confusion at home. Summary After the procedure, it is common to feel sleepy, have impaired judgment, or feel nauseous if you eat too soon. Rest after you get home. Know the things you should not do after the procedure. Follow the diet recommended by your health care provider and drink enough fluid to keep your urine pale yellow. Get help right away if you have trouble breathing or new-onset confusion at home. This information is not intended to replace advice given to you by your health care provider. Make sure you discuss any questions you have with your health care provider. Document Revised: 12/20/2019 Document Reviewed: 07/18/2019 Elsevier Patient Education  2023 Elsevier Inc.  

## 2024-03-20 NOTE — Progress Notes (Signed)
 Ice bag given to us  as needed for comfort to right upper neck and right upper chest.

## 2024-03-20 NOTE — Procedures (Signed)
  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 Postprocedure diagnosis: same EBL:    minimal Complications:   none immediate  See full dictation in [employer].  CHARM Toribio Faes MD Main # [phone] Pager  [phone] Mobile [phone]

## 2024-03-20 NOTE — Telephone Encounter (Signed)
 Patient had contacted [facility] Triage Nurse on 03/19/24 in regards to applying Lidocaine  cream before PORT Insertion 03/20/24. Triage Nurse Jon Gu RN explained how to apply/ how long before procedure. Contacted to follow-up if any other questions/ concerns needed to be addressed, patient stated no further questions as this time.

## 2024-03-21 ENCOUNTER — Encounter: Payer: Self-pay | Admitting: Oncology

## 2024-03-21 NOTE — Progress Notes (Signed)
 Pharmacist Chemotherapy Monitoring - Initial Assessment    Anticipated start date: 03/28/24   The following has been reviewed per standard work regarding the patient's treatment regimen: The patient's diagnosis, treatment plan and drug doses, and organ/hematologic function Lab orders and baseline tests specific to treatment regimen  The treatment plan start date, drug sequencing, and pre-medications Prior authorization status  Patient's documented medication list, including drug-drug interaction screen and prescriptions for anti-emetics and supportive care specific to the treatment regimen The drug concentrations, fluid compatibility, administration routes, and timing of the medications to be used The patient's access for treatment and lifetime cumulative dose history, if applicable  The patient's medication allergies and previous infusion related reactions, if applicable   Changes made to treatment plan:  N/A  Follow up needed:  N/A   Diksha Tagliaferro Kreul, RPH, 03/21/2024  2:41 PM

## 2024-03-22 ENCOUNTER — Other Ambulatory Visit: Payer: Self-pay

## 2024-03-25 ENCOUNTER — Ambulatory Visit: Admitting: Nurse Practitioner

## 2024-03-26 ENCOUNTER — Other Ambulatory Visit: Payer: Self-pay

## 2024-03-26 NOTE — Progress Notes (Deleted)
 Cardiology Office Note:   Date:  03/26/2024  ID:  Derrick Johnson, Derrick Johnson 1956-01-29, MRN [id number] PCP:  Loring Tanda Mae, MD  [facility] Providers Cardiologist:  Wendel Haws, MD Referring MD: Loring Tanda Mae, *  Chief Complaint/Reason for Referral: Follow-up for coronary artery disease ASSESSMENT:    1. ST elevation myocardial infarction involving left anterior descending (LAD) coronary artery (HCC)   2. Ischemic cardiomyopathy   3. Hyperlipidemia LDL goal <55   4. Primary hypertension   5. BMI 32.0-32.9,adult     PLAN:   In order of problems listed above: STEMI: Continue Plavix  75 mg, aspirin  81 mg for now; stop aspirin  in March 2026 and continue Plavix  monotherapy indefinitely.  Continue atorvastatin  80 mg and Toprol  25 mg daily continue as needed nitroglycerin . Ischemic cardiomyopathy: Last ejection fraction had normalized.  Continue Toprol  XL 25 mg daily, spironolactone  25 mg daily, and Entresto  24 x 26 mg twice daily; patient intolerant of SGLT2 inhibitors due to UTIs. Hyperlipidemia: Continue atorvastatin  80 mg; check lipid panel LFTs today.*** Hypertension: Continue Toprol  25 mg daily, Entresto  24 x 26 mg twice daily, spironolactone  25 mg daily*** Elevated BMI: Continue diet and exercise modification; check hemoglobin A1c to screen for diabetes.  4 years ago his hemoglobin A1c was 5.4.***        {Are you ordering a CV Procedure (e.g. stress test, cath, DCCV, TEE, etc)?   Press F2        :[id number]}   Dispo:  No follow-ups on file.       Labs/tests ordered: No orders of the defined types were placed in this encounter.   Current medicines are reviewed at length with the patient today.  The patient {ACTIONS; HAS/DOES NOT HAVE:19233} concerns regarding medicines.  I spent *** minutes reviewing all clinical data during and prior to this visit including all relevant imaging studies, laboratories, clinical information from other health systems and prior notes  from both Cardiology and other specialties, interviewing the patient, conducting a complete physical examination, and coordinating care in order to formulate a comprehensive and personalized evaluation and treatment plan.   History of Present Illness:    FOCUSED PROBLEM LIST:   STEMI March 2025 Status post DES x 2 mid LAD Ischemic cardiomyopathy No significant valve problems, EF 40 to 45% March 2025 TTE Mild LVH, no significant valve issues, EF 60 to 65% TTE July 2025 Intolerant of Jardiance >> UTIs Hypertension Hyperlipidemia LP(a) 15.4 Rectal cancer Status post chemotherapy with Xeloda  and XRT BMI 32       Current Medications: No outpatient medications have been marked as taking for the 04/01/24 encounter (Appointment) with Dejean Tribby K, MD.     Review of Systems:   Please see the history of present illness.    All other systems reviewed and are negative.     EKGs/Labs/Other Test Reviewed:   EKG: April 2025 sinus rhythm with septal infarction pattern  EKG Interpretation Date/Time:    Ventricular Rate:    PR Interval:    QRS Duration:    QT Interval:    QTC Calculation:   R Axis:      Text Interpretation:           Risk Assessment/Calculations:   {Does this patient have ATRIAL FIBRILLATION?:[id number]}      Physical Exam:   VS:  There were no vitals taken for this visit.   No BP recorded.  {Refresh Note OR Click here to enter BP  :1}***   Wt Readings from  Last 3 Encounters:  03/20/24 218 lb 0.6 oz (98.9 kg)  03/15/24 218 lb (98.9 kg)  03/12/24 218 lb 14.4 oz (99.3 kg)      GENERAL:  No apparent distress, AOx3 HEENT:  No carotid bruits, +2 carotid impulses, no scleral icterus CAR: RRR Irregular RR*** no murmurs***, gallops, rubs, or thrills RES:  Clear to auscultation bilaterally ABD:  Soft, nontender, nondistended, positive bowel sounds x 4 VASC:  +2 radial pulses, +2 carotid pulses NEURO:  CN 2-12 grossly intact; motor and sensory grossly  intact PSYCH:  No active depression or anxiety EXT:  No edema, ecchymosis, or cyanosis  Signed, Sarah Baez K Roann Merk, MD  03/26/2024 4:54 PM    [facility] [address] [place], [place], [place]  [address] Phone: [phone]; Fax: [phone]   Note:  This document was prepared using Dragon voice recognition software and may include unintentional dictation errors.

## 2024-03-27 NOTE — Progress Notes (Unsigned)
 [facility]   Telephone:([phone]) [phone] Fax:([phone]) [phone]    Patient Care Team: Loring Tanda Mae, MD as PCP - General (Family Medicine) Wendel Lurena POUR, MD as PCP - Cardiology (Cardiology) Babs Arthea DASEN, MD as Consulting Physician (Physical Medicine and Rehabilitation) Debby Fidela CROME, NP as Nurse Practitioner (Physical Medicine and Rehabilitation) Lennard Lesta FALCON, MD as Consulting Physician (Gastroenterology)   CHIEF COMPLAINT: Follow-up metastatic rectal cancer  CURRENT THERAPY: Pending systemic therapy FOLFOX/Belvo  INTERVAL HISTORY Mr. Mcbroom returns for follow-up and to begin treatment, last seen 7/8.  He saw Dr. Shyrl and had a port placed in the interim.  ROS   Past Medical History:  Diagnosis Date   Asthma    Back contusion    CAD (coronary artery disease) 11/27/2023   s/p PCI   Chronic migraine    Chronic pain    Dissection of cerebral artery (HCC)    Electrocution and nonfatal effects of electric current    Fibromyalgia    GERD (gastroesophageal reflux disease)    in past, no current problem per pt on 02/26/24   Ischemic cardiomyopathy    Pneumonia    Rectal cancer (HCC)    STEMI (ST elevation myocardial infarction) (HCC) 11/27/2023   Stroke [facility])      Past Surgical History:  Procedure Laterality Date   COLONOSCOPY     CORONARY/GRAFT ACUTE MI REVASCULARIZATION N/A 11/27/2023   Procedure: Coronary/Graft Acute MI Revascularization;  Surgeon: Thukkani, Arun K, MD;  Location: [facility] INVASIVE CV LAB;  Service: Cardiovascular;  Laterality: N/A;   CRYO INTERCOSTAL NERVE BLOCK     IR IMAGING GUIDED PORT INSERTION  03/20/2024   LEFT HEART CATH AND CORONARY ANGIOGRAPHY N/A 11/27/2023   Procedure: LEFT HEART CATH AND CORONARY ANGIOGRAPHY;  Surgeon: Wendel Lurena POUR, MD;  Location: [facility] INVASIVE CV LAB;  Service: Cardiovascular;  Laterality: N/A;   maxofacial surgery     ROTATOR CUFF REPAIR Left    SPINAL CORD STIMULATOR IMPLANT     does not  work per patient in years   SPINE SURGERY     TMJ ARTHROSCOPY     VIDEO BRONCHOSCOPY WITH ENDOBRONCHIAL NAVIGATION Bilateral 02/27/2024   Procedure: VIDEO BRONCHOSCOPY WITH ENDOBRONCHIAL NAVIGATION;  Surgeon: Shelah Lamar RAMAN, MD;  Location: [facility] ENDOSCOPY;  Service: Pulmonary;  Laterality: Bilateral;     Outpatient Encounter Medications as of 03/28/2024  Medication Sig Note   albuterol  (PROVENTIL ) (2.5 MG/3ML) 0.083% nebulizer solution Take 2.5 mg by nebulization every 6 (six) hours as needed for wheezing or shortness of breath.     albuterol  (VENTOLIN  HFA) 108 (90 Base) MCG/ACT inhaler Inhale 1-2 puffs into the lungs every 6 (six) hours as needed for wheezing or shortness of breath.    aspirin  81 MG chewable tablet Chew 1 tablet (81 mg total) by mouth daily.    atorvastatin  (LIPITOR) 80 MG tablet Take 1 tablet (80 mg total) by mouth daily.    clopidogrel  (PLAVIX ) 75 MG tablet Take 1 tablet (75 mg total) by mouth daily. Okay to restart this medication on 02/28/2024.    cyclobenzaprine (FLEXERIL) 10 MG tablet Take 10 mg by mouth every 8 (eight) hours as needed for muscle spasms.    dexamethasone  (DECADRON ) 4 MG tablet Take 2 tablets (8 mg total) by mouth daily. Start the day after chemotherapy for 2 days. Take with food. 03/20/2024: Hasn't started   furosemide  (LASIX ) 20 MG tablet Take 1 tablet (20 mg total) by mouth daily.  lidocaine -prilocaine  (EMLA ) cream Apply to affected area once 03/20/2024: Hasn't started   methadone  (DOLOPHINE ) 10 MG tablet Take 1 tablet (10 mg total) by mouth in the morning, at noon, in the evening, and at bedtime.    methylphenidate (RITALIN) 20 MG tablet Take 20 mg by mouth 3 (three) times daily.    metoprolol  succinate (TOPROL -XL) 25 MG 24 hr tablet Take 1 tablet (25 mg total) by mouth at bedtime.    Multiple Vitamin (MULTIVITAMIN) capsule Take 1 capsule by mouth daily.    naloxegol oxalate  (MOVANTIK ) 25 MG TABS tablet Take 25 mg by mouth daily.    nitroGLYCERIN   (NITROSTAT ) 0.4 MG SL tablet Place 1 tablet (0.4 mg total) under the tongue every 5 (five) minutes x 3 doses as needed for chest pain. 03/20/2024: [id number]   ondansetron  (ZOFRAN ) 8 MG tablet Take 1 tablet (8 mg total) by mouth every 8 (eight) hours as needed for nausea or vomiting. Start on the third day after chemotherapy. 03/20/2024: Hasn't started   polyethylene glycol (MIRALAX) 17 g packet Take 17 g by mouth daily.    prochlorperazine  (COMPAZINE ) 10 MG tablet Take 1 tablet (10 mg total) by mouth every 6 (six) hours as needed for nausea or vomiting. 03/20/2024: Hasn't started   sacubitril-valsartan (ENTRESTO ) 24-26 MG Take 1 tablet by mouth 2 (two) times daily.    spironolactone  (ALDACTONE ) 25 MG tablet Take 0.5 tablets (12.5 mg total) by mouth daily.    SUMAtriptan  (IMITREX ) 100 MG tablet Take 100 mg by mouth every 2 (two) hours as needed for migraine.    tamsulosin (FLOMAX) 0.4 MG CAPS capsule Take 0.4-0.8 mg by mouth See admin instructions. Take 0.4mg  (1 capsule) by mouth daily for 2 weeks, then increase to 0.8mg  (2 capsules) daily, 11/28/2023: Currently taking 0.4mg .    traZODone  (DESYREL ) 150 MG tablet Take 0.5 tablets (75 mg total) by mouth at bedtime as needed for sleep.    VITAMIN D -VITAMIN K PO Take 1 capsule by mouth daily. 10,000IU/ 100 mcg    Wheat Dextrin (BENEFIBER PO) Take 1 tablet by mouth 3 (three) times daily as needed (Constipation per wating report).    Facility-Administered Encounter Medications as of 03/28/2024  Medication   clindamycin  (CLEOCIN ) 900 mg in dextrose  5 % 50 mL IVPB   And   gentamicin  (GARAMYCIN ) 5 mg/kg in dextrose  5 % 50 mL IVPB     There were no vitals filed for this visit. There is no height or weight on file to calculate BMI.   ECOG PERFORMANCE STATUS: {CHL ONC ECOG PS:[id number]}  PHYSICAL EXAM GENERAL:alert, no distress and comfortable SKIN: no rash  EYES: sclera clear NECK: without mass LYMPH:  no palpable cervical or supraclavicular  lymphadenopathy  LUNGS: clear with normal breathing effort HEART: regular rate & rhythm, no lower extremity edema ABDOMEN: abdomen soft, non-tender and normal bowel sounds NEURO: alert & oriented x 3 with fluent speech, no focal motor/sensory deficits Breast exam:  PAC without erythema    CBC    Latest Ref Rng & Units 01/30/2024    1:57 PM 11/29/2023    8:02 AM 11/28/2023    2:27 AM  CBC  WBC 4.0 - 10.5 K/uL 7.9  9.6  9.3   Hemoglobin 13.0 - 17.0 g/dL 82.7  85.4  84.7   Hematocrit 39.0 - 52.0 % 51.4  42.3  44.4   Platelets 150 - 400 K/uL 250  210  215       CMP     Latest Ref  Rng & Units 01/30/2024    1:57 PM 12/22/2023   10:27 AM 12/15/2023    9:00 AM  CMP  Glucose 70 - 99 mg/dL 884  880  889   BUN 8 - 23 mg/dL 6  10  6    Creatinine 0.61 - 1.24 mg/dL 8.84  8.87  8.96   Sodium 135 - 145 mmol/L 138  139  138   Potassium 3.5 - 5.1 mmol/L 4.6  4.7  4.5   Chloride 98 - 111 mmol/L 100  103  100   CO2 22 - 32 mmol/L 26  27  27    Calcium  8.9 - 10.3 mg/dL 89.8  9.6  9.5   Total Protein 6.5 - 8.1 g/dL 7.9     Total Bilirubin 0.0 - 1.2 mg/dL 0.4     Alkaline Phos 38 - 126 U/L 95     AST 15 - 41 U/L 31     ALT 0 - 44 U/L 29         ASSESSMENT & PLAN: Adenocarcinoma of rectum, 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  2 small perirectal nodes measuring 3 to 4 mm -Lower EUS 11/13/2019-tumor in the proximal rectum.  Proximal posterolateral rectal mass.  2 abnormal lymph nodes in the perirectal region.  Staged T3N1 by endorectal ultrasound.   -S/p Xeloda /radiation 11/25/2019-01/01/2020 under Dr. Cloretta -Evidently had dental infections around the time of rectal surgery in 02/2020 and deferred; lost follow up with Dr. Debby, onc, and GI -His PCP referred  him back to his and he reestablished care with us  on 01/30/2024.  CEA was normal at 1.68.  - 02/08/2024, restaging CT chest, abdomen and pelvis showed multiple lung nodules, largest 1 measuring 1.9 cm, concerning for pulmonary metastatic disease.  Previously seen circumferential superior rectal mass was no longer distinctly visible.  No evidence of lymphadenopathy or metastatic disease in the abdomen or pelvis.  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  No evidence of metastatic adenopathy in the chest or evidence of other metastatic disease in the abdomen or pelvis. -02/27/2024 Dr. Shelah performed flexible video fiberoptic bronchoscopy with robotic assistance and biopsies of right upper lobe lung nodule.  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  Immunostains were positive for CK20 and CDX2, negative for CK7 and TTF-1, consistent with a colorectal primary. Now stage IV disease.   -Obtaining Guardant 360 molecular testing  -Saw Dr. Lightfoot 03/15/24 for surgical opinion: he likely is out of his radiation window to undergo rectal cancer resection. If this cannot be resected there is minimal advantage to resect the pulmonary nodules     PLAN:  No orders of the defined types were placed in this encounter.     All questions were answered. The patient knows to call the clinic with any problems, questions or concerns. No barriers to learning were detected. I spent *** counseling the patient face to face. The total time spent in the appointment was *** and more than 50% was on counseling, review of test results, and coordination of care.   Maurina Fawaz K Tristian Sickinger, NP 03/27/2024 1:38 PM

## 2024-03-28 ENCOUNTER — Inpatient Hospital Stay (HOSPITAL_BASED_OUTPATIENT_CLINIC_OR_DEPARTMENT_OTHER): Admitting: Nurse Practitioner

## 2024-03-28 ENCOUNTER — Encounter: Payer: Self-pay | Admitting: Nurse Practitioner

## 2024-03-28 ENCOUNTER — Inpatient Hospital Stay

## 2024-03-28 VITALS — BP 116/86 | HR 73 | Temp 99.1°F | Resp 18

## 2024-03-28 VITALS — BP 115/87 | HR 67 | Temp 98.6°F | Resp 18 | Ht 69.0 in | Wt 221.7 lb

## 2024-03-28 DIAGNOSIS — C20 Malignant neoplasm of rectum: Secondary | ICD-10-CM | POA: Diagnosis not present

## 2024-03-28 DIAGNOSIS — Z5111 Encounter for antineoplastic chemotherapy: Secondary | ICD-10-CM | POA: Diagnosis not present

## 2024-03-28 LAB — CMP (CANCER CENTER ONLY)
ALT: 32 U/L (ref 0–44)
AST: 28 U/L (ref 15–41)
Albumin: 4.4 g/dL (ref 3.5–5.0)
Alkaline Phosphatase: 81 U/L (ref 38–126)
Anion gap: 11 (ref 5–15)
BUN: 6 mg/dL — ABNORMAL LOW (ref 8–23)
CO2: 25 mmol/L (ref 22–32)
Calcium: 9.1 mg/dL (ref 8.9–10.3)
Chloride: 101 mmol/L (ref 98–111)
Creatinine: 1.09 mg/dL (ref 0.61–1.24)
GFR, Estimated: >60 mL/min (ref >60)
Glucose, Bld: 117 mg/dL — ABNORMAL HIGH (ref 70–99)
Potassium: 3.8 mmol/L (ref 3.5–5.1)
Sodium: 138 mmol/L (ref 135–145)
Total Bilirubin: 0.4 mg/dL (ref 0.0–1.2)
Total Protein: 7.5 g/dL (ref 6.5–8.1)

## 2024-03-28 LAB — CBC WITH DIFFERENTIAL (CANCER CENTER ONLY)
Abs Immature Granulocytes: 0.06 K/uL (ref 0.00–0.07)
Basophils Absolute: 0.1 K/uL (ref 0.0–0.1)
Basophils Relative: 1 %
Eosinophils Absolute: 0.3 K/uL (ref 0.0–0.5)
Eosinophils Relative: 4 %
HCT: 49.6 % (ref 39.0–52.0)
Hemoglobin: 16.7 g/dL (ref 13.0–17.0)
Immature Granulocytes: 1 %
Lymphocytes Relative: 12 %
Lymphs Abs: 0.9 K/uL (ref 0.7–4.0)
MCH: 29 pg (ref 26.0–34.0)
MCHC: 33.7 g/dL (ref 30.0–36.0)
MCV: 86.3 fL (ref 80.0–100.0)
Monocytes Absolute: 0.9 K/uL (ref 0.1–1.0)
Monocytes Relative: 11 %
Neutro Abs: 5.4 K/uL (ref 1.7–7.7)
Neutrophils Relative %: 71 %
Platelet Count: 218 K/uL (ref 150–400)
RBC: 5.75 MIL/uL (ref 4.22–5.81)
RDW: 13.9 % (ref 11.5–15.5)
WBC Count: 7.6 K/uL (ref 4.0–10.5)
nRBC: 0 % (ref 0.0–0.2)

## 2024-03-28 LAB — TOTAL PROTEIN, URINE DIPSTICK: Protein, ur: NEGATIVE mg/dL

## 2024-03-28 MED ORDER — PALONOSETRON HCL INJECTION 0.25 MG/5ML
0.2500 mg | Freq: Once | INTRAVENOUS | Status: AC
  Administered 2024-03-28: 0.25 mg via INTRAVENOUS
  Filled 2024-03-28: qty 5

## 2024-03-28 MED ORDER — OXALIPLATIN CHEMO INJECTION 100 MG/20ML
85.0000 mg/m2 | Freq: Once | INTRAVENOUS | Status: AC
  Administered 2024-03-28: 200 mg via INTRAVENOUS
  Filled 2024-03-28: qty 40

## 2024-03-28 MED ORDER — FLUOROURACIL CHEMO INJECTION 2.5 GM/50ML
400.0000 mg/m2 | Freq: Once | INTRAVENOUS | Status: AC
  Administered 2024-03-28: 900 mg via INTRAVENOUS
  Filled 2024-03-28: qty 18

## 2024-03-28 MED ORDER — DEXAMETHASONE SODIUM PHOSPHATE 10 MG/ML IJ SOLN
10.0000 mg | Freq: Once | INTRAMUSCULAR | Status: AC
  Administered 2024-03-28: 10 mg via INTRAVENOUS
  Filled 2024-03-28: qty 1

## 2024-03-28 MED ORDER — SODIUM CHLORIDE 0.9 % IV SOLN
INTRAVENOUS | Status: DC
Start: 2024-03-28 — End: 2024-03-28

## 2024-03-28 MED ORDER — SODIUM CHLORIDE 0.9 % IV SOLN
2400.0000 mg/m2 | INTRAVENOUS | Status: DC
  Administered 2024-03-28: 5000 mg via INTRAVENOUS
  Filled 2024-03-28: qty 100

## 2024-03-28 MED ORDER — DEXTROSE 5 % IV SOLN
INTRAVENOUS | Status: DC
Start: 2024-03-28 — End: 2024-03-28

## 2024-03-28 MED ORDER — LEUCOVORIN CALCIUM INJECTION 350 MG
400.0000 mg/m2 | Freq: Once | INTRAVENOUS | Status: AC
  Administered 2024-03-28: 880 mg via INTRAVENOUS
  Filled 2024-03-28: qty 44

## 2024-03-28 NOTE — Addendum Note (Signed)
 Addended by: STEVA DEVERE CROME on: 03/28/2024 04:32 PM   Modules accepted: Orders

## 2024-03-28 NOTE — Patient Instructions (Addendum)
 [facility] - A DEPT OF [place]  Discharge Instructions: Thank you for choosing [facility] to provide your oncology and hematology care.   If you have a lab appointment with the Cancer Center, please go directly to the Cancer Center and check in at the registration area.   Wear comfortable clothing and clothing appropriate for easy access to any Portacath or PICC line.   We strive to give you quality time with your provider. You may need to reschedule your appointment if you arrive late (15 or more minutes).  Arriving late affects you and other patients whose appointments are after yours.  Also, if you miss three or more appointments without notifying the office, you may be dismissed from the clinic at the provider's discretion.      For prescription refill requests, have your pharmacy contact our office and allow 72 hours for refills to be completed.    Today you received the following chemotherapy and/or immunotherapy agents Oxaliplatin , leucovorin , and flurouracil.      To help prevent nausea and vomiting after your treatment, we encourage you to take your nausea medication as directed.  BELOW ARE SYMPTOMS THAT SHOULD BE REPORTED IMMEDIATELY: *FEVER GREATER THAN 100.4 F (38 C) OR HIGHER *CHILLS OR SWEATING *NAUSEA AND VOMITING THAT IS NOT CONTROLLED WITH YOUR NAUSEA MEDICATION *UNUSUAL SHORTNESS OF BREATH *UNUSUAL BRUISING OR BLEEDING *URINARY PROBLEMS (pain or burning when urinating, or frequent urination) *BOWEL PROBLEMS (unusual diarrhea, constipation, pain near the anus) TENDERNESS IN MOUTH AND THROAT WITH OR WITHOUT PRESENCE OF ULCERS (sore throat, sores in mouth, or a toothache) UNUSUAL RASH, SWELLING OR PAIN  UNUSUAL VAGINAL DISCHARGE OR ITCHING   Items with * indicate a potential emergency and should be followed up as soon as possible or go to the Emergency Department if any problems should occur.  Please show the  CHEMOTHERAPY ALERT CARD or IMMUNOTHERAPY ALERT CARD at check-in to the Emergency Department and triage nurse.  Should you have questions after your visit or need to cancel or reschedule your appointment, please contact [facility] - A DEPT OF [facility]  Dept: [phone]  and follow the prompts.  Office hours are 8:00 a.m. to 4:30 p.m. Monday - Friday. Please note that voicemails left after 4:00 p.m. may not be returned until the following business day.  We are closed weekends and major holidays. You have access to a nurse at all times for urgent questions. Please call the main number to the clinic Dept: [phone] and follow the prompts.   For any non-urgent questions, you may also contact your provider using MyChart. We now offer e-Visits for anyone 56 and older to request care online for non-urgent symptoms. For details visit [email or web address].   Also download the MyChart app! Go to the app store, search MyChart, open the app, select [place], and log in with your MyChart username and password.  Oxaliplatin  Injection What is this medication? OXALIPLATIN  (ox AL i PLA tin) treats colorectal cancer. It works by slowing down the growth of cancer cells. This medicine may be used for other purposes; ask your health care provider or pharmacist if you have questions. COMMON BRAND NAME(S): Eloxatin  What should I tell my care team before I take this medication? They need to know if you have any of these conditions: Heart disease History of irregular heartbeat or rhythm Liver disease Low blood cell levels (white cells, red cells, and platelets) Lung or  breathing disease, such as asthma Take medications that treat or prevent blood clots Tingling of the fingers, toes, or other nerve disorder An unusual or allergic reaction to oxaliplatin , other medications, foods, dyes, or preservatives If you or your partner are pregnant or trying to get  pregnant Breast-feeding How should I use this medication? This medication is injected into a vein. It is given by your care team in a hospital or clinic setting. Talk to your care team about the use of this medication in children. Special care may be needed. Overdosage: If you think you have taken too much of this medicine contact a poison control center or emergency room at once. NOTE: This medicine is only for you. Do not share this medicine with others. What if I miss a dose? Keep appointments for follow-up doses. It is important not to miss a dose. Call your care team if you are unable to keep an appointment. What may interact with this medication? Do not take this medication with any of the following: Cisapride Dronedarone Pimozide Thioridazine This medication may also interact with the following: Aspirin  and aspirin -like medications Certain medications that treat or prevent blood clots, such as warfarin, apixaban, dabigatran, and rivaroxaban Cisplatin Cyclosporine Diuretics Medications for infection, such as acyclovir, adefovir, amphotericin B, bacitracin, cidofovir, foscarnet, ganciclovir, gentamicin , pentamidine, vancomycin NSAIDs, medications for pain and inflammation, such as ibuprofen  or naproxen Other medications that cause heart rhythm changes Pamidronate Zoledronic acid This list may not describe all possible interactions. Give your health care provider a list of all the medicines, herbs, non-prescription drugs, or dietary supplements you use. Also tell them if you smoke, drink alcohol, or use illegal drugs. Some items may interact with your medicine. What should I watch for while using this medication? Your condition will be monitored carefully while you are receiving this medication. You may need blood work while taking this medication. This medication may make you feel generally unwell. This is not uncommon as chemotherapy can affect healthy cells as well as cancer  cells. Report any side effects. Continue your course of treatment even though you feel ill unless your care team tells you to stop. This medication may increase your risk of getting an infection. Call your care team for advice if you get a fever, chills, sore throat, or other symptoms of a cold or flu. Do not treat yourself. Try to avoid being around people who are sick. Avoid taking medications that contain aspirin , acetaminophen , ibuprofen , naproxen, or ketoprofen unless instructed by your care team. These medications may hide a fever. Be careful brushing or flossing your teeth or using a toothpick because you may get an infection or bleed more easily. If you have any dental work done, tell your dentist you are receiving this medication. This medication can make you more sensitive to cold. Do not drink cold drinks or use ice. Cover exposed skin before coming in contact with cold temperatures or cold objects. When out in cold weather wear warm clothing and cover your mouth and nose to warm the air that goes into your lungs. Tell your care team if you get sensitive to the cold. Talk to your care team if you or your partner are pregnant or think either of you might be pregnant. This medication can cause serious birth defects if taken during pregnancy and for 9 months after the last dose. A negative pregnancy test is required before starting this medication. A reliable form of contraception is recommended while taking this medication and for 9  months after the last dose. Talk to your care team about effective forms of contraception. Do not father a child while taking this medication and for 6 months after the last dose. Use a condom while having sex during this time period. Do not breastfeed while taking this medication and for 3 months after the last dose. This medication may cause infertility. Talk to your care team if you are concerned about your fertility. What side effects may I notice from receiving this  medication? Side effects that you should report to your care team as soon as possible: Allergic reactions--skin rash, itching, hives, swelling of the face, lips, tongue, or throat Bleeding--bloody or black, tar-like stools, vomiting blood or brown material that looks like coffee grounds, red or dark brown urine, small red or purple spots on skin, unusual bruising or bleeding Dry cough, shortness of breath or trouble breathing Heart rhythm changes--fast or irregular heartbeat, dizziness, feeling faint or lightheaded, chest pain, trouble breathing Infection--fever, chills, cough, sore throat, wounds that don't heal, pain or trouble when passing urine, general feeling of discomfort or being unwell Liver injury--right upper belly pain, loss of appetite, nausea, light-colored stool, dark yellow or brown urine, yellowing skin or eyes, unusual weakness or fatigue Low red blood cell level--unusual weakness or fatigue, dizziness, headache, trouble breathing Muscle injury--unusual weakness or fatigue, muscle pain, dark yellow or brown urine, decrease in amount of urine Pain, tingling, or numbness in the hands or feet Sudden and severe headache, confusion, change in vision, seizures, which may be signs of posterior reversible encephalopathy syndrome (PRES) Unusual bruising or bleeding Side effects that usually do not require medical attention (report to your care team if they continue or are bothersome): Diarrhea Nausea Pain, redness, or swelling with sores inside the mouth or throat Unusual weakness or fatigue Vomiting This list may not describe all possible side effects. Call your doctor for medical advice about side effects. You may report side effects to FDA at 1-800-FDA-1088. Where should I keep my medication? This medication is given in a hospital or clinic. It will not be stored at home. NOTE: This sheet is a summary. It may not cover all possible information. If you have questions about this  medicine, talk to your doctor, pharmacist, or health care provider.  2024 Elsevier/Gold Standard (2023-08-04 00:00:00) Leucovorin  Injection What is this medication? LEUCOVORIN  (loo koe VOR in) prevents side effects from certain medications, such as methotrexate. It works by increasing folate levels. This helps protect healthy cells in your body. It may also be used to treat anemia caused by low levels of folate. It can also be used with fluorouracil , a type of chemotherapy, to treat colorectal cancer. It works by increasing the effects of fluorouracil  in the body. This medicine may be used for other purposes; ask your health care provider or pharmacist if you have questions. What should I tell my care team before I take this medication? They need to know if you have any of these conditions: Anemia from low levels of vitamin B12 in the blood An unusual or allergic reaction to leucovorin , folic acid, other medications, foods, dyes, or preservatives Pregnant or trying to get pregnant Breastfeeding How should I use this medication? This medication is injected into a vein or a muscle. It is given by your care team in a hospital or clinic setting. Talk to your care team about the use of this medication in children. Special care may be needed. Overdosage: If you think you have taken too much  of this medicine contact a poison control center or emergency room at once. NOTE: This medicine is only for you. Do not share this medicine with others. What if I miss a dose? Keep appointments for follow-up doses. It is important not to miss your dose. Call your care team if you are unable to keep an appointment. What may interact with this medication? Capecitabine  Fluorouracil  Phenobarbital Phenytoin Primidone Trimethoprim;sulfamethoxazole  This list may not describe all possible interactions. Give your health care provider a list of all the medicines, herbs, non-prescription drugs, or dietary supplements  you use. Also tell them if you smoke, drink alcohol, or use illegal drugs. Some items may interact with your medicine. What should I watch for while using this medication? Your condition will be monitored carefully while you are receiving this medication. This medication may increase the side effects of 5-fluorouracil . Tell your care team if you have diarrhea or mouth sores that do not get better or that get worse. What side effects may I notice from receiving this medication? Side effects that you should report to your care team as soon as possible: Allergic reactions--skin rash, itching, hives, swelling of the face, lips, tongue, or throat This list may not describe all possible side effects. Call your doctor for medical advice about side effects. You may report side effects to FDA at 1-800-FDA-1088. Where should I keep my medication? This medication is given in a hospital or clinic. It will not be stored at home. NOTE: This sheet is a summary. It may not cover all possible information. If you have questions about this medicine, talk to your doctor, pharmacist, or health care provider.  2024 Elsevier/Gold Standard (2022-01-25 00:00:00)  Fluorouracil  Injection What is this medication? FLUOROURACIL  (flure oh YOOR a sil) treats some types of cancer. It works by slowing down the growth of cancer cells. This medicine may be used for other purposes; ask your health care provider or pharmacist if you have questions. COMMON BRAND NAME(S): Adrucil  What should I tell my care team before I take this medication? They need to know if you have any of these conditions: Blood disorders Dihydropyrimidine dehydrogenase (DPD) deficiency Infection, such as chickenpox, cold sores, herpes Kidney disease Liver disease Poor nutrition Recent or ongoing radiation therapy An unusual or allergic reaction to fluorouracil , other medications, foods, dyes, or preservatives If you or your partner are pregnant or  trying to get pregnant Breast-feeding How should I use this medication? This medication is injected into a vein. It is administered by your care team in a hospital or clinic setting. Talk to your care team about the use of this medication in children. Special care may be needed. Overdosage: If you think you have taken too much of this medicine contact a poison control center or emergency room at once. NOTE: This medicine is only for you. Do not share this medicine with others. What if I miss a dose? Keep appointments for follow-up doses. It is important not to miss your dose. Call your care team if you are unable to keep an appointment. What may interact with this medication? Do not take this medication with any of the following: Live virus vaccines This medication may also interact with the following: Medications that treat or prevent blood clots, such as warfarin, enoxaparin, dalteparin This list may not describe all possible interactions. Give your health care provider a list of all the medicines, herbs, non-prescription drugs, or dietary supplements you use. Also tell them if you smoke, drink alcohol, or use  illegal drugs. Some items may interact with your medicine. What should I watch for while using this medication? Your condition will be monitored carefully while you are receiving this medication. This medication may make you feel generally unwell. This is not uncommon as chemotherapy can affect healthy cells as well as cancer cells. Report any side effects. Continue your course of treatment even though you feel ill unless your care team tells you to stop. In some cases, you may be given additional medications to help with side effects. Follow all directions for their use. This medication may increase your risk of getting an infection. Call your care team for advice if you get a fever, chills, sore throat, or other symptoms of a cold or flu. Do not treat yourself. Try to avoid being around  people who are sick. This medication may increase your risk to bruise or bleed. Call your care team if you notice any unusual bleeding. Be careful brushing or flossing your teeth or using a toothpick because you may get an infection or bleed more easily. If you have any dental work done, tell your dentist you are receiving this medication. Avoid taking medications that contain aspirin , acetaminophen , ibuprofen , naproxen, or ketoprofen unless instructed by your care team. These medications may hide a fever. Do not treat diarrhea with over the counter products. Contact your care team if you have diarrhea that lasts more than 2 days or if it is severe and watery. This medication can make you more sensitive to the sun. Keep out of the sun. If you cannot avoid being in the sun, wear protective clothing and sunscreen. Do not use sun lamps, tanning beds, or tanning booths. Talk to your care team if you or your partner wish to become pregnant or think you might be pregnant. This medication can cause serious birth defects if taken during pregnancy and for 3 months after the last dose. A reliable form of contraception is recommended while taking this medication and for 3 months after the last dose. Talk to your care team about effective forms of contraception. Do not father a child while taking this medication and for 3 months after the last dose. Use a condom while having sex during this time period. Do not breastfeed while taking this medication. This medication may cause infertility. Talk to your care team if you are concerned about your fertility. What side effects may I notice from receiving this medication? Side effects that you should report to your care team as soon as possible: Allergic reactions--skin rash, itching, hives, swelling of the face, lips, tongue, or throat Heart attack--pain or tightness in the chest, shoulders, arms, or jaw, nausea, shortness of breath, cold or clammy skin, feeling faint or  lightheaded Heart failure--shortness of breath, swelling of the ankles, feet, or hands, sudden weight gain, unusual weakness or fatigue Heart rhythm changes--fast or irregular heartbeat, dizziness, feeling faint or lightheaded, chest pain, trouble breathing High ammonia level--unusual weakness or fatigue, confusion, loss of appetite, nausea, vomiting, seizures Infection--fever, chills, cough, sore throat, wounds that don't heal, pain or trouble when passing urine, general feeling of discomfort or being unwell Low red blood cell level--unusual weakness or fatigue, dizziness, headache, trouble breathing Pain, tingling, or numbness in the hands or feet, muscle weakness, change in vision, confusion or trouble speaking, loss of balance or coordination, trouble walking, seizures Redness, swelling, and blistering of the skin over hands and feet Severe or prolonged diarrhea Unusual bruising or bleeding Side effects that usually do not require  medical attention (report to your care team if they continue or are bothersome): Dry skin Headache Increased tears Nausea Pain, redness, or swelling with sores inside the mouth or throat Sensitivity to light Vomiting This list may not describe all possible side effects. Call your doctor for medical advice about side effects. You may report side effects to FDA at 1-800-FDA-1088. Where should I keep my medication? This medication is given in a hospital or clinic. It will not be stored at home. NOTE: This sheet is a summary. It may not cover all possible information. If you have questions about this medicine, talk to your doctor, pharmacist, or health care provider.  2024 Elsevier/Gold Standard (2021-12-28 00:00:00)

## 2024-03-28 NOTE — Patient Instructions (Signed)

## 2024-03-28 NOTE — Progress Notes (Signed)
 Patient seen by Lacie Burton, NP today  Vitals are within treatment parameters:Yes   Labs are within treatment parameters: Yes   Treatment plan has been signed: Yes   Per physician team, Patient is ready for treatment and there are NO modifications to the treatment plan.

## 2024-03-29 ENCOUNTER — Telehealth: Payer: Self-pay

## 2024-03-29 NOTE — Telephone Encounter (Signed)
 24 Hour Call Back  Reached out to patient regarding first time treatment. Patient states he is not having any side effects and is feeling good. Educated patient to please call with any questions or concerns.

## 2024-03-30 ENCOUNTER — Inpatient Hospital Stay

## 2024-03-30 VITALS — BP 127/83 | HR 70 | Temp 97.7°F | Resp 13

## 2024-03-30 DIAGNOSIS — Z5111 Encounter for antineoplastic chemotherapy: Secondary | ICD-10-CM | POA: Diagnosis not present

## 2024-03-30 DIAGNOSIS — Z95828 Presence of other vascular implants and grafts: Secondary | ICD-10-CM | POA: Insufficient documentation

## 2024-03-30 MED ORDER — SODIUM CHLORIDE 0.9% FLUSH
10.0000 mL | Freq: Once | INTRAVENOUS | Status: AC
Start: 2024-03-30 — End: 2024-03-30
  Administered 2024-03-30: 10 mL

## 2024-03-30 MED ORDER — HEPARIN SOD (PORK) LOCK FLUSH 100 UNIT/ML IV SOLN
500.0000 [IU] | Freq: Once | INTRAVENOUS | Status: AC
  Administered 2024-03-30: 500 [IU]

## 2024-04-01 ENCOUNTER — Ambulatory Visit: Admitting: Internal Medicine

## 2024-04-01 ENCOUNTER — Inpatient Hospital Stay

## 2024-04-01 DIAGNOSIS — Z6832 Body mass index (BMI) 32.0-32.9, adult: Secondary | ICD-10-CM

## 2024-04-01 DIAGNOSIS — I255 Ischemic cardiomyopathy: Secondary | ICD-10-CM

## 2024-04-01 DIAGNOSIS — I10 Essential (primary) hypertension: Secondary | ICD-10-CM

## 2024-04-01 DIAGNOSIS — E785 Hyperlipidemia, unspecified: Secondary | ICD-10-CM

## 2024-04-01 DIAGNOSIS — I2102 ST elevation (STEMI) myocardial infarction involving left anterior descending coronary artery: Secondary | ICD-10-CM

## 2024-04-01 NOTE — Progress Notes (Signed)
 [facility] Psychosocial Distress Screening Clinical Social Work  Derrick Johnson is a 67 y.o. year old male. Clinical Social Work was referred by nurse for positive distress screening. The patient scored a 6 on the Psychosocial Distress Thermometer which indicates mild distress. Clinical Social Worker contacted patient by phone to assess for distress and other psychosocial needs.     Distress Screen:    03/28/2024    4:30 PM  ONCBCN DISTRESS SCREENING  Screening Type Other (comment)  How much distress have you been experiencing in the past week? (0-10) 6  Emotional concerns type Worry or anxiety  Physical Concerns Type  Pain;Sleep;Fatigue;Memory or concentration     Interventions: CSW assessed the areas named in the distress screening. Patient attributed the score to it being his first treatment that evoked feels of uncertainty.  Patient reported decreased anxiety after first treatment, adding  that it was not as bad as he imagined.CSW further provided verbal and emotional support for patient to discuss completed treatment and effects on emotional functioning.  CSW informed patient of the support team and support services at [facility] should his anxiety continue.  CSW provided contact information and encouraged patient to call with any questions or concerns.   Follow Up Plan: No follow up scheduled. CSW remains available if needed.  Patient verbalizes understanding of plan: Yes  Lizbeth Sprague, LCSW

## 2024-04-01 NOTE — Progress Notes (Unsigned)
 [facility]   [phone] Fax:([phone]) [phone]    Patient Care Team: Loring Tanda Mae, MD as PCP - General (Family Medicine) Wendel Lurena POUR, MD as PCP - Cardiology (Cardiology) Babs Arthea DASEN, MD as Consulting Physician (Physical Medicine and Rehabilitation) Debby Fidela CROME, NP as Nurse Practitioner (Physical Medicine and Rehabilitation) Lennard Lesta FALCON, MD as Consulting Physician (Gastroenterology)   I connected with Derrick Johnson on 04/03/24 at  8:00 AM EDT by telephone visit and verified that I am speaking with the correct person using two identifiers.   I discussed the limitations, risks, security and privacy concerns of performing an evaluation and management service by telemedicine and the availability of in-person appointments. I also discussed with the patient that there may be a patient responsible charge related to this service. The patient expressed understanding and agreed to proceed.   Other persons participating in the visit and their role in the encounter: None   Patient's location: Home  Provider's location: [facility] provider office at [facility]    Chief Complaint: Toxicity check      Oncology History  Adenocarcinoma of rectum (HCC)  11/07/2019 Initial Diagnosis   Adenocarcinoma of rectum (HCC)   03/12/2024 Cancer Staging   Staging form: Colon and Rectum, AJCC 8th Edition - Clinical stage from 03/12/2024: Stage IVA (rcTX, rcNX, rpM1a) - Signed by Autumn Millman, MD on 03/12/2024 Stage prefix: Recurrence   03/28/2024 -  Chemotherapy   Patient is on Treatment Plan : COLORECTAL FOLFOX + Bevacizumab q14d        CURRENT THERAPY: FOLFOX q2 weeks, starting 03/28/24. Plan to add Beva with cycle 2  INTERVAL HISTORY Derrick Johnson presents for phone f/up toxicity check following C1 D1 FOLFOX on 7/24. He tolerated well. Appetite and energy were low but improving, he gets up out of bed and is active at home. Does  not leave the house much but this is [place] Cold sensitivity is easing up, no neuropathy. Denies mouth sores. Bowels moving well. Took anti-emetics prophylactically for a few days which helped. Cough is nearly gone and other basic pains improved after chemo. Denies fever, chills, cough, chest pain, dyspnea, rash, leg edema, or new concerns.   ROS  All other systems reviewed and negative   Past Medical History:  Diagnosis Date   Asthma    Back contusion    CAD (coronary artery disease) 11/27/2023   s/p PCI   Chronic migraine    Chronic pain    Dissection of cerebral artery (HCC)    Electrocution and nonfatal effects of electric current    Fibromyalgia    GERD (gastroesophageal reflux disease)    in past, no current problem per pt on 02/26/24   Ischemic cardiomyopathy    Pneumonia    Rectal cancer (HCC)    STEMI (ST elevation myocardial infarction) (HCC) 11/27/2023   Stroke [facility])      Past Surgical History:  Procedure Laterality Date   COLONOSCOPY     CORONARY/GRAFT ACUTE MI REVASCULARIZATION N/A 11/27/2023   Procedure: Coronary/Graft Acute MI Revascularization;  Surgeon: Thukkani, Arun K, MD;  Location: [facility] INVASIVE CV LAB;  Service: Cardiovascular;  Laterality: N/A;   CRYO INTERCOSTAL NERVE BLOCK     IR IMAGING GUIDED PORT INSERTION  03/20/2024   LEFT HEART CATH AND CORONARY ANGIOGRAPHY N/A 11/27/2023   Procedure: LEFT HEART CATH AND CORONARY ANGIOGRAPHY;  Surgeon: Wendel Lurena POUR, MD;  Location: [facility]  INVASIVE CV LAB;  Service: Cardiovascular;  Laterality: N/A;   maxofacial surgery     ROTATOR CUFF REPAIR Left    SPINAL CORD STIMULATOR IMPLANT     does not work per patient in years   SPINE SURGERY     TMJ ARTHROSCOPY     VIDEO BRONCHOSCOPY WITH ENDOBRONCHIAL NAVIGATION Bilateral 02/27/2024   Procedure: VIDEO BRONCHOSCOPY WITH ENDOBRONCHIAL NAVIGATION;  Surgeon: Shelah Lamar RAMAN, MD;  Location: [facility] ENDOSCOPY;  Service: Pulmonary;  Laterality: Bilateral;      Outpatient Encounter Medications as of 04/03/2024  Medication Sig Note   albuterol  (PROVENTIL ) (2.5 MG/3ML) 0.083% nebulizer solution Take 2.5 mg by nebulization every 6 (six) hours as needed for wheezing or shortness of breath.     albuterol  (VENTOLIN  HFA) 108 (90 Base) MCG/ACT inhaler Inhale 1-2 puffs into the lungs every 6 (six) hours as needed for wheezing or shortness of breath.    aspirin  81 MG chewable tablet Chew 1 tablet (81 mg total) by mouth daily.    atorvastatin  (LIPITOR) 80 MG tablet Take 1 tablet (80 mg total) by mouth daily.    clopidogrel  (PLAVIX ) 75 MG tablet Take 1 tablet (75 mg total) by mouth daily. Okay to restart this medication on 02/28/2024.    cyclobenzaprine (FLEXERIL) 10 MG tablet Take 10 mg by mouth every 8 (eight) hours as needed for muscle spasms.    dexamethasone  (DECADRON ) 4 MG tablet Take 2 tablets (8 mg total) by mouth daily. Start the day after chemotherapy for 2 days. Take with food. (Patient not taking: Reported on 03/28/2024) 03/20/2024: Hasn't started   furosemide  (LASIX ) 20 MG tablet Take 1 tablet (20 mg total) by mouth daily.    lidocaine -prilocaine  (EMLA ) cream Apply to affected area once 03/20/2024: Hasn't started   methadone  (DOLOPHINE ) 10 MG tablet Take 1 tablet (10 mg total) by mouth in the morning, at noon, in the evening, and at bedtime.    methylphenidate (RITALIN) 20 MG tablet Take 20 mg by mouth 3 (three) times daily.    metoprolol  succinate (TOPROL -XL) 25 MG 24 hr tablet Take 1 tablet (25 mg total) by mouth at bedtime.    Multiple Vitamin (MULTIVITAMIN) capsule Take 1 capsule by mouth daily. (Patient not taking: Reported on 03/28/2024)    naloxegol oxalate  (MOVANTIK ) 25 MG TABS tablet Take 25 mg by mouth daily.    nitroGLYCERIN  (NITROSTAT ) 0.4 MG SL tablet Place 1 tablet (0.4 mg total) under the tongue every 5 (five) minutes x 3 doses as needed for chest pain. (Patient not taking: Reported on 03/28/2024) 03/20/2024: 11-2022   ondansetron  (ZOFRAN ) 8  MG tablet Take 1 tablet (8 mg total) by mouth every 8 (eight) hours as needed for nausea or vomiting. Start on the third day after chemotherapy. (Patient not taking: Reported on 03/28/2024) 03/20/2024: Hasn't started   polyethylene glycol (MIRALAX) 17 g packet Take 17 g by mouth daily.    prochlorperazine  (COMPAZINE ) 10 MG tablet Take 1 tablet (10 mg total) by mouth every 6 (six) hours as needed for nausea or vomiting. (Patient not taking: Reported on 03/28/2024) 03/20/2024: Hasn't started   sacubitril-valsartan (ENTRESTO ) 24-26 MG Take 1 tablet by mouth 2 (two) times daily.    spironolactone  (ALDACTONE ) 25 MG tablet Take 0.5 tablets (12.5 mg total) by mouth daily.    SUMAtriptan  (IMITREX ) 100 MG tablet Take 100 mg by mouth every 2 (two) hours as needed for migraine.    tamsulosin (FLOMAX) 0.4 MG CAPS capsule Take 0.4-0.8 mg by mouth See admin instructions.  Take 0.4mg  (1 capsule) by mouth daily for 2 weeks, then increase to 0.8mg  (2 capsules) daily, 11/28/2023: Currently taking 0.4mg .    traZODone  (DESYREL ) 150 MG tablet Take 0.5 tablets (75 mg total) by mouth at bedtime as needed for sleep.    VITAMIN D -VITAMIN K PO Take 1 capsule by mouth daily. 10,000IU/ 100 mcg    Wheat Dextrin (BENEFIBER PO) Take 1 tablet by mouth 3 (three) times daily as needed (Constipation per wating report).    Facility-Administered Encounter Medications as of 04/03/2024  Medication   clindamycin  (CLEOCIN ) 900 mg in dextrose  5 % 50 mL IVPB   And   gentamicin  (GARAMYCIN ) 5 mg/kg in dextrose  5 % 50 mL IVPB     There were no vitals filed for this visit. There is no height or weight on file to calculate BMI.   ECOG PERFORMANCE STATUS: 1 - Symptomatic but completely ambulatory  PHYSICAL EXAM Patient appears well by phone.  Voice is strong, speech is clear.  Mood/affect appear normal for situation.  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  2 small perirectal nodes measuring 3 to 4 mm -Lower EUS 11/13/2019-tumor in the proximal rectum.  Proximal posterolateral rectal mass.  2 abnormal lymph nodes in the perirectal region.  Staged T3N1 by endorectal ultrasound.   -S/p Xeloda /radiation 11/25/2019-01/01/2020 under Dr. Cloretta -Surgery planned for 02/2020 but unfortuantely lost follow up with Dr. Debby, onc, and GI -His PCP referred him back to his and he reestablished care with oncology 01/30/2024.  CEA was normal at 1.68.  - 02/08/2024, restaging CT chest, abdomen and pelvis showed multiple lung nodules, largest 1 measuring 1.9 cm, concerning for pulmonary metastatic disease.  Previously seen circumferential superior rectal mass was no longer distinctly visible.  No evidence of lymphadenopathy or metastatic disease in the abdomen or pelvis.  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  No evidence of metastatic adenopathy in the chest or evidence of other metastatic disease in the abdomen or pelvis. -02/27/2024 Dr. Shelah performed flexible video fiberoptic bronchoscopy with robotic assistance and biopsies of right upper lobe lung nodule.  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  Immunostains were positive for CK20 and CDX2, negative for CK7 and TTF-1, consistent with a colorectal primary. Now stage IV disease.   -Obtaining Guardant 360 molecular testing  -Saw Dr. Lightfoot 03/15/24 for surgical opinion: he likely is out of his radiation window to undergo rectal cancer resection. If this cannot be resected there is minimal advantage to  resect the pulmonary nodules -Mr. Cech began FOLFOX 03/28/2024, tolerated very well overall with minimal cold sensitivity and low energy/appetite.  He is recovering well, side effects are adequately managed with supportive care at home.  Cough has improved.  There is no clinical evidence of disease progression  - He will return for follow-up and cycle 2 on 8/5 as scheduled, he understands the plan to add Beva with cycle 2, we reviewed potential risk/benefit and side effects including but not limited to HTN, proteinuria, thrombosis, bleeding.  He agrees to proceed -  PLAN: -Tolerating C1 FOLFOX well, continue supportive care at home -Return for follow up and C2 FOLFOX with First Bevacizumab 8/5 as scheduled   All questions were answered. The patient knows to call the clinic with any problems, questions or concerns. No barriers to learning were detected. The total time spent in the appointment was 10 minutes and more than 50% was on counseling, review of test results, and coordination of care.   Derrick Crandell K Elster Corbello, NP 04/03/2024

## 2024-04-03 ENCOUNTER — Ambulatory Visit: Admitting: Nurse Practitioner

## 2024-04-03 ENCOUNTER — Ambulatory Visit

## 2024-04-03 ENCOUNTER — Other Ambulatory Visit

## 2024-04-03 ENCOUNTER — Inpatient Hospital Stay: Admitting: Nurse Practitioner

## 2024-04-03 ENCOUNTER — Encounter: Payer: Self-pay | Admitting: Nurse Practitioner

## 2024-04-03 DIAGNOSIS — C20 Malignant neoplasm of rectum: Secondary | ICD-10-CM | POA: Diagnosis not present

## 2024-04-04 ENCOUNTER — Telehealth: Payer: Self-pay | Admitting: Acute Care

## 2024-04-04 ENCOUNTER — Other Ambulatory Visit: Payer: Self-pay | Admitting: *Deleted

## 2024-04-04 DIAGNOSIS — C20 Malignant neoplasm of rectum: Secondary | ICD-10-CM

## 2024-04-04 NOTE — Telephone Encounter (Deleted)
 Derrick Johnson, the patient has already completed their CT HEAD W & WO CONTRAST ( ) . There is an order for CT BRAINLAB HEAD WO/W CONTRAST ( ) that hasn't been scheduled and was informed that these are the same order. Can we cancel this order?

## 2024-04-05 ENCOUNTER — Encounter

## 2024-04-09 ENCOUNTER — Inpatient Hospital Stay

## 2024-04-09 ENCOUNTER — Inpatient Hospital Stay (HOSPITAL_BASED_OUTPATIENT_CLINIC_OR_DEPARTMENT_OTHER): Admitting: Nurse Practitioner

## 2024-04-09 ENCOUNTER — Encounter: Payer: Self-pay | Admitting: Nurse Practitioner

## 2024-04-09 ENCOUNTER — Inpatient Hospital Stay: Attending: Oncology

## 2024-04-09 VITALS — BP 121/70 | HR 61 | Temp 97.7°F | Resp 18

## 2024-04-09 VITALS — BP 112/72 | HR 75 | Temp 98.4°F | Resp 18 | Ht 69.0 in | Wt 222.4 lb

## 2024-04-09 DIAGNOSIS — C20 Malignant neoplasm of rectum: Secondary | ICD-10-CM | POA: Insufficient documentation

## 2024-04-09 DIAGNOSIS — M542 Cervicalgia: Secondary | ICD-10-CM | POA: Diagnosis not present

## 2024-04-09 DIAGNOSIS — C7801 Secondary malignant neoplasm of right lung: Secondary | ICD-10-CM | POA: Insufficient documentation

## 2024-04-09 DIAGNOSIS — G43909 Migraine, unspecified, not intractable, without status migrainosus: Secondary | ICD-10-CM | POA: Insufficient documentation

## 2024-04-09 DIAGNOSIS — I252 Old myocardial infarction: Secondary | ICD-10-CM | POA: Diagnosis not present

## 2024-04-09 DIAGNOSIS — K1379 Other lesions of oral mucosa: Secondary | ICD-10-CM | POA: Insufficient documentation

## 2024-04-09 DIAGNOSIS — Z7952 Long term (current) use of systemic steroids: Secondary | ICD-10-CM | POA: Insufficient documentation

## 2024-04-09 DIAGNOSIS — Z79899 Other long term (current) drug therapy: Secondary | ICD-10-CM | POA: Diagnosis not present

## 2024-04-09 DIAGNOSIS — R21 Rash and other nonspecific skin eruption: Secondary | ICD-10-CM | POA: Diagnosis not present

## 2024-04-09 DIAGNOSIS — Z923 Personal history of irradiation: Secondary | ICD-10-CM | POA: Insufficient documentation

## 2024-04-09 DIAGNOSIS — T451X5A Adverse effect of antineoplastic and immunosuppressive drugs, initial encounter: Secondary | ICD-10-CM | POA: Insufficient documentation

## 2024-04-09 DIAGNOSIS — K649 Unspecified hemorrhoids: Secondary | ICD-10-CM | POA: Diagnosis not present

## 2024-04-09 DIAGNOSIS — R110 Nausea: Secondary | ICD-10-CM | POA: Diagnosis not present

## 2024-04-09 DIAGNOSIS — K120 Recurrent oral aphthae: Secondary | ICD-10-CM | POA: Diagnosis not present

## 2024-04-09 DIAGNOSIS — L089 Local infection of the skin and subcutaneous tissue, unspecified: Secondary | ICD-10-CM | POA: Diagnosis not present

## 2024-04-09 DIAGNOSIS — G620 Drug-induced polyneuropathy: Secondary | ICD-10-CM | POA: Insufficient documentation

## 2024-04-09 DIAGNOSIS — Z7982 Long term (current) use of aspirin: Secondary | ICD-10-CM | POA: Insufficient documentation

## 2024-04-09 DIAGNOSIS — C7802 Secondary malignant neoplasm of left lung: Secondary | ICD-10-CM | POA: Diagnosis not present

## 2024-04-09 DIAGNOSIS — R531 Weakness: Secondary | ICD-10-CM | POA: Insufficient documentation

## 2024-04-09 DIAGNOSIS — Z5111 Encounter for antineoplastic chemotherapy: Secondary | ICD-10-CM | POA: Diagnosis present

## 2024-04-09 LAB — CBC WITH DIFFERENTIAL (CANCER CENTER ONLY)
Abs Immature Granulocytes: 0.02 K/uL (ref 0.00–0.07)
Basophils Absolute: 0 K/uL (ref 0.0–0.1)
Basophils Relative: 1 %
Eosinophils Absolute: 0.3 K/uL (ref 0.0–0.5)
Eosinophils Relative: 5 %
HCT: 46 % (ref 39.0–52.0)
Hemoglobin: 15.8 g/dL (ref 13.0–17.0)
Immature Granulocytes: 0 %
Lymphocytes Relative: 16 %
Lymphs Abs: 1.1 K/uL (ref 0.7–4.0)
MCH: 28.9 pg (ref 26.0–34.0)
MCHC: 34.3 g/dL (ref 30.0–36.0)
MCV: 84.1 fL (ref 80.0–100.0)
Monocytes Absolute: 0.8 K/uL (ref 0.1–1.0)
Monocytes Relative: 12 %
Neutro Abs: 4.6 K/uL (ref 1.7–7.7)
Neutrophils Relative %: 66 %
Platelet Count: 221 K/uL (ref 150–400)
RBC: 5.47 MIL/uL (ref 4.22–5.81)
RDW: 13.3 % (ref 11.5–15.5)
WBC Count: 6.9 K/uL (ref 4.0–10.5)
nRBC: 0 % (ref 0.0–0.2)

## 2024-04-09 LAB — CMP (CANCER CENTER ONLY)
ALT: 27 U/L (ref 0–44)
AST: 28 U/L (ref 15–41)
Albumin: 4.1 g/dL (ref 3.5–5.0)
Alkaline Phosphatase: 100 U/L (ref 38–126)
Anion gap: 10 (ref 5–15)
BUN: 15 mg/dL (ref 8–23)
CO2: 25 mmol/L (ref 22–32)
Calcium: 8.6 mg/dL — ABNORMAL LOW (ref 8.9–10.3)
Chloride: 100 mmol/L (ref 98–111)
Creatinine: 1.09 mg/dL (ref 0.61–1.24)
GFR, Estimated: >60 mL/min (ref >60)
Glucose, Bld: 109 mg/dL — ABNORMAL HIGH (ref 70–99)
Potassium: 4.1 mmol/L (ref 3.5–5.1)
Sodium: 135 mmol/L (ref 135–145)
Total Bilirubin: 0.3 mg/dL (ref 0.0–1.2)
Total Protein: 6.8 g/dL (ref 6.5–8.1)

## 2024-04-09 MED ORDER — SODIUM CHLORIDE 0.9 % IV SOLN: INTRAVENOUS | Status: DC

## 2024-04-09 MED ORDER — DEXAMETHASONE SODIUM PHOSPHATE 10 MG/ML IJ SOLN
10.0000 mg | Freq: Once | INTRAMUSCULAR | Status: AC
  Administered 2024-04-09: 10 mg via INTRAVENOUS
  Filled 2024-04-09: qty 1

## 2024-04-09 MED ORDER — SODIUM CHLORIDE 0.9 % IV SOLN
5.0000 mg/kg | Freq: Once | INTRAVENOUS | Status: AC
  Administered 2024-04-09: 500 mg via INTRAVENOUS
  Filled 2024-04-09: qty 16

## 2024-04-09 MED ORDER — HEPARIN SOD (PORK) LOCK FLUSH 100 UNIT/ML IV SOLN: 500.0000 [IU] | Freq: Once | INTRAVENOUS | Status: DC | PRN

## 2024-04-09 MED ORDER — ALTEPLASE 2 MG IJ SOLR
2.0000 mg | Freq: Once | INTRAMUSCULAR | Status: DC | PRN
Start: 2024-04-09 — End: 2024-04-09

## 2024-04-09 MED ORDER — LEUCOVORIN CALCIUM INJECTION 350 MG
400.0000 mg/m2 | Freq: Once | INTRAVENOUS | Status: AC
  Administered 2024-04-09: 880 mg via INTRAVENOUS
  Filled 2024-04-09: qty 44

## 2024-04-09 MED ORDER — SODIUM CHLORIDE 0.9% FLUSH
10.0000 mL | INTRAVENOUS | Status: DC | PRN
Start: 2024-04-09 — End: 2024-04-09

## 2024-04-09 MED ORDER — FLUOROURACIL CHEMO INJECTION 2.5 GM/50ML
400.0000 mg/m2 | Freq: Once | INTRAVENOUS | Status: AC
  Administered 2024-04-09: 900 mg via INTRAVENOUS
  Filled 2024-04-09: qty 18

## 2024-04-09 MED ORDER — DOXYCYCLINE HYCLATE 100 MG PO TABS: 100.0000 mg | ORAL_TABLET | Freq: Two times a day (BID) | ORAL | 0 refills | Status: AC

## 2024-04-09 MED ORDER — OXALIPLATIN CHEMO INJECTION 100 MG/20ML
85.0000 mg/m2 | Freq: Once | INTRAVENOUS | Status: AC
  Administered 2024-04-09: 200 mg via INTRAVENOUS
  Filled 2024-04-09: qty 40

## 2024-04-09 MED ORDER — SODIUM CHLORIDE 0.9 % IV SOLN
2400.0000 mg/m2 | INTRAVENOUS | Status: DC
  Administered 2024-04-09: 5000 mg via INTRAVENOUS
  Filled 2024-04-09: qty 100

## 2024-04-09 MED ORDER — DEXTROSE 5 % IV SOLN
INTRAVENOUS | Status: DC
Start: 2024-04-09 — End: 2024-04-09

## 2024-04-09 MED ORDER — COLD PACK MISC ONCOLOGY: 1.0000 | Freq: Once | Status: DC | PRN

## 2024-04-09 MED ORDER — HEPARIN SOD (PORK) LOCK FLUSH 100 UNIT/ML IV SOLN
250.0000 [IU] | Freq: Once | INTRAVENOUS | Status: DC | PRN
Start: 2024-04-09 — End: 2024-04-09

## 2024-04-09 MED ORDER — SODIUM CHLORIDE 0.9% FLUSH: 3.0000 mL | INTRAVENOUS | Status: DC | PRN

## 2024-04-09 MED ORDER — PALONOSETRON HCL INJECTION 0.25 MG/5ML
0.2500 mg | Freq: Once | INTRAVENOUS | Status: AC
  Administered 2024-04-09: 0.25 mg via INTRAVENOUS
  Filled 2024-04-09: qty 5

## 2024-04-09 NOTE — Progress Notes (Signed)
 Patient seen by Lonna Cobb NP today  Vitals are within treatment parameters:Yes   Labs are within treatment parameters: Yes   Treatment plan has been signed: Yes   Per physician team, Patient is ready for treatment and there are NO modifications to the treatment plan.

## 2024-04-09 NOTE — Progress Notes (Signed)
 [facility] OFFICE PROGRESS NOTE   Diagnosis: Rectal cancer  INTERVAL HISTORY:   Derrick Johnson returns as scheduled.  He completed cycle 1 FOLFOX 03/28/2024.  He had mild nausea.  No vomiting.  No mouth sores.  No diarrhea.  No hand or foot pain or redness.  Cold sensitivity lasted about 1 week.  No change in baseline tingling in the hands.  3 to 4 days ago he noted a rash on his back, neck and face.  His wife reports he chronically has bumps on his back, now increased.  3 to 4 days ago he had an episode of lower chest/upper abdominal pain.  He consider taking a nitroglycerin  tablet due to his concern for another heart attack.  He reports having a heart attack in March of this year.  He burped 4-6 times with relief.  No associated symptoms such as shortness of breath, radiating pain.  Objective:  Vital signs in last 24 hours:  Blood pressure 112/72, pulse 75, temperature 98.4 F (36.9 C), temperature source Temporal, resp. rate 18, height 5' 9 (1.753 m), weight 222 lb 6.4 oz (100.9 kg), SpO2 98%.    HEENT: No thrush or ulcers. Resp: Lungs clear bilaterally. Cardio: Regular rate and rhythm. GI: No hepatosplenomegaly. Vascular: No leg edema. Neuro: Alert and oriented. Skin: Erythematous rash scattered over the upper back and neck, few areas on the face.  Many of the lesions are pustules.  Palms without erythema. Port-A-Cath without erythema.  Lab Results:  Lab Results  Component Value Date   WBC 7.6 03/28/2024   HGB 16.7 03/28/2024   HCT 49.6 03/28/2024   MCV 86.3 03/28/2024   PLT 218 03/28/2024   NEUTROABS 5.4 03/28/2024    Imaging:  No results found.  Medications: I have reviewed the patient's current medications.  Assessment/Plan: Metastatic rectal cancer Colonoscopy 10/29/2019-rectal mass at approximately 10-11 cm above the anus, pathology confirmed invasive well-differentiated adenocarcinoma, normal mismatch repair protein expression -CTs 11/12/2019-mild  mid rectal wall thickening.  2 small perirectal nodes measuring 3 to 4 mm -Lower EUS 11/13/2019-tumor in the proximal rectum.  Proximal posterolateral rectal mass.  2 abnormal lymph nodes in the perirectal region.  Staged T3N1 by endorectal ultrasound.   -S/p Xeloda /radiation 11/25/2019-01/01/2020 under Dr. Cloretta -Surgery planned for 02/2020 but unfortuantely lost follow up with Dr. Debby, onc, and GI -His PCP referred him back to his and he reestablished care with oncology 01/30/2024.  CEA was normal at 1.68.  - 02/08/2024, restaging CT chest, abdomen and pelvis showed multiple lung nodules, largest 1 measuring 1.9 cm, concerning for pulmonary metastatic disease.  Previously seen circumferential superior rectal mass was no longer distinctly visible.  No evidence of lymphadenopathy or metastatic disease in the abdomen or pelvis.  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  No evidence of metastatic adenopathy in the chest or evidence of other metastatic disease in the abdomen or pelvis. -02/27/2024 Dr. Shelah performed flexible video fiberoptic bronchoscopy with robotic assistance and biopsies of right upper lobe lung nodule.  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  Immunostains were positive for CK20 and CDX2, negative for CK7 and TTF-1, consistent with a colorectal primary. Now stage IV disease.   -Obtaining Guardant 360 molecular testing-obtained 04/09/2024 -Saw Dr. Lightfoot 03/15/24 for surgical opinion: he likely is out of his radiation window to undergo rectal cancer resection. If this cannot be resected there is minimal advantage to resect  the pulmonary nodules  Disposition: Derrick Johnson appears stable.  He has completed 1 cycle of FOLFOX, overall seems to have tolerated well.  He is scheduled for cycle 2 today plus bevacizumab .  We again reviewed potential toxicities associated with  bevacizumab .  He agrees to proceed.  CBC and chemistry panel reviewed.  Labs adequate for treatment.  He has a pustular skin rash mainly on his back.  He will complete a course of doxycycline .  He will return for follow-up and the next cycle of treatment in 2 weeks.  He will contact the office in the interim with any problems.  Patient seen with Dr. Cloretta.    Derrick Johnson ANP/GNP-BC   04/09/2024  9:59 AM  This was a shared with Derrick Johnson.  Derrick Johnson has been diagnosed with metastatic rectal cancer.  He has completed 1 cycle of FOLFOX.  He is scheduled for FOLFOX/bevacizumab  today.  We reviewed potential toxicities associated with the FOLFOX regimen including the chance of cardiac toxicity.  We discussed the side effects associated with bevacizumab  including the chance of an allergic reaction, hypertension, bleeding, thromboembolic disease, nephrotoxicity, CNS toxicity, and delayed wound healing.  He agrees to proceed with FOLFOX/bevacizumab  today.  He will complete a course of doxycycline  for the pustular rash over the trunk.  Arvella Cloretta, MD

## 2024-04-09 NOTE — Patient Instructions (Signed)

## 2024-04-09 NOTE — Patient Instructions (Signed)
 [facility] - A DEPT OF [place]  Discharge Instructions: Thank you for choosing [facility] to provide your oncology and hematology care.   If you have a lab appointment with the Cancer Center, please go directly to the Cancer Center and check in at the registration area.   Wear comfortable clothing and clothing appropriate for easy access to any Portacath or PICC line.   We strive to give you quality time with your provider. You may need to reschedule your appointment if you arrive late (15 or more minutes).  Arriving late affects you and other patients whose appointments are after yours.  Also, if you miss three or more appointments without notifying the office, you may be dismissed from the clinic at the provider's discretion.      For prescription refill requests, have your pharmacy contact our office and allow 72 hours for refills to be completed.    Today you received the following chemotherapy and/or immunotherapy agents Oxaliplatin , leucovorin , and flurouracil.      To help prevent nausea and vomiting after your treatment, we encourage you to take your nausea medication as directed.  BELOW ARE SYMPTOMS THAT SHOULD BE REPORTED IMMEDIATELY: *FEVER GREATER THAN 100.4 F (38 C) OR HIGHER *CHILLS OR SWEATING *NAUSEA AND VOMITING THAT IS NOT CONTROLLED WITH YOUR NAUSEA MEDICATION *UNUSUAL SHORTNESS OF BREATH *UNUSUAL BRUISING OR BLEEDING *URINARY PROBLEMS (pain or burning when urinating, or frequent urination) *BOWEL PROBLEMS (unusual diarrhea, constipation, pain near the anus) TENDERNESS IN MOUTH AND THROAT WITH OR WITHOUT PRESENCE OF ULCERS (sore throat, sores in mouth, or a toothache) UNUSUAL RASH, SWELLING OR PAIN  UNUSUAL VAGINAL DISCHARGE OR ITCHING   Items with * indicate a potential emergency and should be followed up as soon as possible or go to the Emergency Department if any problems should occur.  Please show the  CHEMOTHERAPY ALERT CARD or IMMUNOTHERAPY ALERT CARD at check-in to the Emergency Department and triage nurse.  Should you have questions after your visit or need to cancel or reschedule your appointment, please contact [facility] - A DEPT OF [facility]  Dept: [phone]  and follow the prompts.  Office hours are 8:00 a.m. to 4:30 p.m. Monday - Friday. Please note that voicemails left after 4:00 p.m. may not be returned until the following business day.  We are closed weekends and major holidays. You have access to a nurse at all times for urgent questions. Please call the main number to the clinic Dept: [phone] and follow the prompts.   For any non-urgent questions, you may also contact your provider using MyChart. We now offer e-Visits for anyone 56 and older to request care online for non-urgent symptoms. For details visit [email or web address].   Also download the MyChart app! Go to the app store, search MyChart, open the app, select [place], and log in with your MyChart username and password.  Oxaliplatin  Injection What is this medication? OXALIPLATIN  (ox AL i PLA tin) treats colorectal cancer. It works by slowing down the growth of cancer cells. This medicine may be used for other purposes; ask your health care provider or pharmacist if you have questions. COMMON BRAND NAME(S): Eloxatin  What should I tell my care team before I take this medication? They need to know if you have any of these conditions: Heart disease History of irregular heartbeat or rhythm Liver disease Low blood cell levels (white cells, red cells, and platelets) Lung or  breathing disease, such as asthma Take medications that treat or prevent blood clots Tingling of the fingers, toes, or other nerve disorder An unusual or allergic reaction to oxaliplatin , other medications, foods, dyes, or preservatives If you or your partner are pregnant or trying to get  pregnant Breast-feeding How should I use this medication? This medication is injected into a vein. It is given by your care team in a hospital or clinic setting. Talk to your care team about the use of this medication in children. Special care may be needed. Overdosage: If you think you have taken too much of this medicine contact a poison control center or emergency room at once. NOTE: This medicine is only for you. Do not share this medicine with others. What if I miss a dose? Keep appointments for follow-up doses. It is important not to miss a dose. Call your care team if you are unable to keep an appointment. What may interact with this medication? Do not take this medication with any of the following: Cisapride Dronedarone Pimozide Thioridazine This medication may also interact with the following: Aspirin  and aspirin -like medications Certain medications that treat or prevent blood clots, such as warfarin, apixaban, dabigatran, and rivaroxaban Cisplatin Cyclosporine Diuretics Medications for infection, such as acyclovir, adefovir, amphotericin B, bacitracin, cidofovir, foscarnet, ganciclovir, gentamicin , pentamidine, vancomycin NSAIDs, medications for pain and inflammation, such as ibuprofen  or naproxen Other medications that cause heart rhythm changes Pamidronate Zoledronic acid This list may not describe all possible interactions. Give your health care provider a list of all the medicines, herbs, non-prescription drugs, or dietary supplements you use. Also tell them if you smoke, drink alcohol, or use illegal drugs. Some items may interact with your medicine. What should I watch for while using this medication? Your condition will be monitored carefully while you are receiving this medication. You may need blood work while taking this medication. This medication may make you feel generally unwell. This is not uncommon as chemotherapy can affect healthy cells as well as cancer  cells. Report any side effects. Continue your course of treatment even though you feel ill unless your care team tells you to stop. This medication may increase your risk of getting an infection. Call your care team for advice if you get a fever, chills, sore throat, or other symptoms of a cold or flu. Do not treat yourself. Try to avoid being around people who are sick. Avoid taking medications that contain aspirin , acetaminophen , ibuprofen , naproxen, or ketoprofen unless instructed by your care team. These medications may hide a fever. Be careful brushing or flossing your teeth or using a toothpick because you may get an infection or bleed more easily. If you have any dental work done, tell your dentist you are receiving this medication. This medication can make you more sensitive to cold. Do not drink cold drinks or use ice. Cover exposed skin before coming in contact with cold temperatures or cold objects. When out in cold weather wear warm clothing and cover your mouth and nose to warm the air that goes into your lungs. Tell your care team if you get sensitive to the cold. Talk to your care team if you or your partner are pregnant or think either of you might be pregnant. This medication can cause serious birth defects if taken during pregnancy and for 9 months after the last dose. A negative pregnancy test is required before starting this medication. A reliable form of contraception is recommended while taking this medication and for 9  months after the last dose. Talk to your care team about effective forms of contraception. Do not father a child while taking this medication and for 6 months after the last dose. Use a condom while having sex during this time period. Do not breastfeed while taking this medication and for 3 months after the last dose. This medication may cause infertility. Talk to your care team if you are concerned about your fertility. What side effects may I notice from receiving this  medication? Side effects that you should report to your care team as soon as possible: Allergic reactions--skin rash, itching, hives, swelling of the face, lips, tongue, or throat Bleeding--bloody or black, tar-like stools, vomiting blood or brown material that looks like coffee grounds, red or dark brown urine, small red or purple spots on skin, unusual bruising or bleeding Dry cough, shortness of breath or trouble breathing Heart rhythm changes--fast or irregular heartbeat, dizziness, feeling faint or lightheaded, chest pain, trouble breathing Infection--fever, chills, cough, sore throat, wounds that don't heal, pain or trouble when passing urine, general feeling of discomfort or being unwell Liver injury--right upper belly pain, loss of appetite, nausea, light-colored stool, dark yellow or brown urine, yellowing skin or eyes, unusual weakness or fatigue Low red blood cell level--unusual weakness or fatigue, dizziness, headache, trouble breathing Muscle injury--unusual weakness or fatigue, muscle pain, dark yellow or brown urine, decrease in amount of urine Pain, tingling, or numbness in the hands or feet Sudden and severe headache, confusion, change in vision, seizures, which may be signs of posterior reversible encephalopathy syndrome (PRES) Unusual bruising or bleeding Side effects that usually do not require medical attention (report to your care team if they continue or are bothersome): Diarrhea Nausea Pain, redness, or swelling with sores inside the mouth or throat Unusual weakness or fatigue Vomiting This list may not describe all possible side effects. Call your doctor for medical advice about side effects. You may report side effects to FDA at 1-800-FDA-1088. Where should I keep my medication? This medication is given in a hospital or clinic. It will not be stored at home. NOTE: This sheet is a summary. It may not cover all possible information. If you have questions about this  medicine, talk to your doctor, pharmacist, or health care provider.  2024 Elsevier/Gold Standard (2023-08-04 00:00:00) Leucovorin  Injection What is this medication? LEUCOVORIN  (loo koe VOR in) prevents side effects from certain medications, such as methotrexate. It works by increasing folate levels. This helps protect healthy cells in your body. It may also be used to treat anemia caused by low levels of folate. It can also be used with fluorouracil , a type of chemotherapy, to treat colorectal cancer. It works by increasing the effects of fluorouracil  in the body. This medicine may be used for other purposes; ask your health care provider or pharmacist if you have questions. What should I tell my care team before I take this medication? They need to know if you have any of these conditions: Anemia from low levels of vitamin B12 in the blood An unusual or allergic reaction to leucovorin , folic acid, other medications, foods, dyes, or preservatives Pregnant or trying to get pregnant Breastfeeding How should I use this medication? This medication is injected into a vein or a muscle. It is given by your care team in a hospital or clinic setting. Talk to your care team about the use of this medication in children. Special care may be needed. Overdosage: If you think you have taken too much  of this medicine contact a poison control center or emergency room at once. NOTE: This medicine is only for you. Do not share this medicine with others. What if I miss a dose? Keep appointments for follow-up doses. It is important not to miss your dose. Call your care team if you are unable to keep an appointment. What may interact with this medication? Capecitabine  Fluorouracil  Phenobarbital Phenytoin Primidone Trimethoprim;sulfamethoxazole  This list may not describe all possible interactions. Give your health care provider a list of all the medicines, herbs, non-prescription drugs, or dietary supplements  you use. Also tell them if you smoke, drink alcohol, or use illegal drugs. Some items may interact with your medicine. What should I watch for while using this medication? Your condition will be monitored carefully while you are receiving this medication. This medication may increase the side effects of 5-fluorouracil . Tell your care team if you have diarrhea or mouth sores that do not get better or that get worse. What side effects may I notice from receiving this medication? Side effects that you should report to your care team as soon as possible: Allergic reactions--skin rash, itching, hives, swelling of the face, lips, tongue, or throat This list may not describe all possible side effects. Call your doctor for medical advice about side effects. You may report side effects to FDA at 1-800-FDA-1088. Where should I keep my medication? This medication is given in a hospital or clinic. It will not be stored at home. NOTE: This sheet is a summary. It may not cover all possible information. If you have questions about this medicine, talk to your doctor, pharmacist, or health care provider.  2024 Elsevier/Gold Standard (2022-01-25 00:00:00)  Fluorouracil  Injection What is this medication? FLUOROURACIL  (flure oh YOOR a sil) treats some types of cancer. It works by slowing down the growth of cancer cells. This medicine may be used for other purposes; ask your health care provider or pharmacist if you have questions. COMMON BRAND NAME(S): Adrucil  What should I tell my care team before I take this medication? They need to know if you have any of these conditions: Blood disorders Dihydropyrimidine dehydrogenase (DPD) deficiency Infection, such as chickenpox, cold sores, herpes Kidney disease Liver disease Poor nutrition Recent or ongoing radiation therapy An unusual or allergic reaction to fluorouracil , other medications, foods, dyes, or preservatives If you or your partner are pregnant or  trying to get pregnant Breast-feeding How should I use this medication? This medication is injected into a vein. It is administered by your care team in a hospital or clinic setting. Talk to your care team about the use of this medication in children. Special care may be needed. Overdosage: If you think you have taken too much of this medicine contact a poison control center or emergency room at once. NOTE: This medicine is only for you. Do not share this medicine with others. What if I miss a dose? Keep appointments for follow-up doses. It is important not to miss your dose. Call your care team if you are unable to keep an appointment. What may interact with this medication? Do not take this medication with any of the following: Live virus vaccines This medication may also interact with the following: Medications that treat or prevent blood clots, such as warfarin, enoxaparin, dalteparin This list may not describe all possible interactions. Give your health care provider a list of all the medicines, herbs, non-prescription drugs, or dietary supplements you use. Also tell them if you smoke, drink alcohol, or use  illegal drugs. Some items may interact with your medicine. What should I watch for while using this medication? Your condition will be monitored carefully while you are receiving this medication. This medication may make you feel generally unwell. This is not uncommon as chemotherapy can affect healthy cells as well as cancer cells. Report any side effects. Continue your course of treatment even though you feel ill unless your care team tells you to stop. In some cases, you may be given additional medications to help with side effects. Follow all directions for their use. This medication may increase your risk of getting an infection. Call your care team for advice if you get a fever, chills, sore throat, or other symptoms of a cold or flu. Do not treat yourself. Try to avoid being around  people who are sick. This medication may increase your risk to bruise or bleed. Call your care team if you notice any unusual bleeding. Be careful brushing or flossing your teeth or using a toothpick because you may get an infection or bleed more easily. If you have any dental work done, tell your dentist you are receiving this medication. Avoid taking medications that contain aspirin , acetaminophen , ibuprofen , naproxen, or ketoprofen unless instructed by your care team. These medications may hide a fever. Do not treat diarrhea with over the counter products. Contact your care team if you have diarrhea that lasts more than 2 days or if it is severe and watery. This medication can make you more sensitive to the sun. Keep out of the sun. If you cannot avoid being in the sun, wear protective clothing and sunscreen. Do not use sun lamps, tanning beds, or tanning booths. Talk to your care team if you or your partner wish to become pregnant or think you might be pregnant. This medication can cause serious birth defects if taken during pregnancy and for 3 months after the last dose. A reliable form of contraception is recommended while taking this medication and for 3 months after the last dose. Talk to your care team about effective forms of contraception. Do not father a child while taking this medication and for 3 months after the last dose. Use a condom while having sex during this time period. Do not breastfeed while taking this medication. This medication may cause infertility. Talk to your care team if you are concerned about your fertility. What side effects may I notice from receiving this medication? Side effects that you should report to your care team as soon as possible: Allergic reactions--skin rash, itching, hives, swelling of the face, lips, tongue, or throat Heart attack--pain or tightness in the chest, shoulders, arms, or jaw, nausea, shortness of breath, cold or clammy skin, feeling faint or  lightheaded Heart failure--shortness of breath, swelling of the ankles, feet, or hands, sudden weight gain, unusual weakness or fatigue Heart rhythm changes--fast or irregular heartbeat, dizziness, feeling faint or lightheaded, chest pain, trouble breathing High ammonia level--unusual weakness or fatigue, confusion, loss of appetite, nausea, vomiting, seizures Infection--fever, chills, cough, sore throat, wounds that don't heal, pain or trouble when passing urine, general feeling of discomfort or being unwell Low red blood cell level--unusual weakness or fatigue, dizziness, headache, trouble breathing Pain, tingling, or numbness in the hands or feet, muscle weakness, change in vision, confusion or trouble speaking, loss of balance or coordination, trouble walking, seizures Redness, swelling, and blistering of the skin over hands and feet Severe or prolonged diarrhea Unusual bruising or bleeding Side effects that usually do not require  medical attention (report to your care team if they continue or are bothersome): Dry skin Headache Increased tears Nausea Pain, redness, or swelling with sores inside the mouth or throat Sensitivity to light Vomiting This list may not describe all possible side effects. Call your doctor for medical advice about side effects. You may report side effects to FDA at 1-800-FDA-1088. Where should I keep my medication? This medication is given in a hospital or clinic. It will not be stored at home. NOTE: This sheet is a summary. It may not cover all possible information. If you have questions about this medicine, talk to your doctor, pharmacist, or health care provider.  2024 Elsevier/Gold Standard (2021-12-28 00:00:00)

## 2024-04-11 ENCOUNTER — Inpatient Hospital Stay

## 2024-04-11 VITALS — BP 108/79 | HR 83 | Temp 98.5°F | Resp 18

## 2024-04-11 DIAGNOSIS — C20 Malignant neoplasm of rectum: Secondary | ICD-10-CM

## 2024-04-11 DIAGNOSIS — Z5111 Encounter for antineoplastic chemotherapy: Secondary | ICD-10-CM | POA: Diagnosis not present

## 2024-04-16 ENCOUNTER — Ambulatory Visit

## 2024-04-16 ENCOUNTER — Other Ambulatory Visit

## 2024-04-16 ENCOUNTER — Ambulatory Visit: Admitting: Oncology

## 2024-04-16 ENCOUNTER — Telehealth: Payer: Self-pay

## 2024-04-16 NOTE — Telephone Encounter (Signed)
 Patient relayed the following concerns yesterday: mouth sores, and a prolasped rectum following considerable constipation.  Dr. Autumn notified and responded with the following: Looks like he received cycle 2 of FOLFOX with Avastin  last week. Mouth sores could be from the FOLFOX.   Called patient to assess and follow up on his concerns. Patient reported that his swollen anus has reduced significantly and his constipation resolved. He reported putting cold compress to area and feels this has helped a lot. He did attend a Doctor's appointment yesterday and received an Rx for nitroglycerin  ointment to put on his rectal swelling to help constrict the vessels, thus shrinking the tissue. Patient has not yet filled this Rx.   Patient reported that his mouth sores were being further agitated from his dentures. He was advised to stop wearing his dentures to allow sores to heal and to eat a soft diet for now. Per Dr. Autumn, the following was advised to patient: for mouth sores, combine baking soda and salt to 1 cup of water and swish and spit. Patient has already used salt and water but agreed to add baking soda.   If this remedy does not help, we can send prescription for Magic mouthwash with lidocaine .   Patient understood and agreed with all recommendations and that he should call if he needs any further help/advice or affected areas are worsening.

## 2024-04-18 ENCOUNTER — Encounter

## 2024-04-23 ENCOUNTER — Inpatient Hospital Stay

## 2024-04-23 ENCOUNTER — Encounter: Payer: Self-pay | Admitting: Oncology

## 2024-04-23 ENCOUNTER — Other Ambulatory Visit: Payer: Self-pay

## 2024-04-23 ENCOUNTER — Inpatient Hospital Stay (HOSPITAL_BASED_OUTPATIENT_CLINIC_OR_DEPARTMENT_OTHER): Admitting: Oncology

## 2024-04-23 ENCOUNTER — Other Ambulatory Visit (HOSPITAL_BASED_OUTPATIENT_CLINIC_OR_DEPARTMENT_OTHER): Payer: Self-pay

## 2024-04-23 VITALS — BP 125/88 | HR 67 | Temp 98.0°F | Resp 18 | Ht 69.0 in | Wt 219.2 lb

## 2024-04-23 DIAGNOSIS — C20 Malignant neoplasm of rectum: Secondary | ICD-10-CM

## 2024-04-23 DIAGNOSIS — Z5111 Encounter for antineoplastic chemotherapy: Secondary | ICD-10-CM | POA: Diagnosis not present

## 2024-04-23 LAB — CBC WITH DIFFERENTIAL (CANCER CENTER ONLY)
Abs Immature Granulocytes: 0.03 K/uL (ref 0.00–0.07)
Basophils Absolute: 0 K/uL (ref 0.0–0.1)
Basophils Relative: 1 %
Eosinophils Absolute: 0.1 K/uL (ref 0.0–0.5)
Eosinophils Relative: 2 %
HCT: 47.9 % (ref 39.0–52.0)
Hemoglobin: 16.3 g/dL (ref 13.0–17.0)
Immature Granulocytes: 1 %
Lymphocytes Relative: 13 %
Lymphs Abs: 0.8 K/uL (ref 0.7–4.0)
MCH: 28.5 pg (ref 26.0–34.0)
MCHC: 34 g/dL (ref 30.0–36.0)
MCV: 83.9 fL (ref 80.0–100.0)
Monocytes Absolute: 0.9 K/uL (ref 0.1–1.0)
Monocytes Relative: 15 %
Neutro Abs: 4.1 K/uL (ref 1.7–7.7)
Neutrophils Relative %: 68 %
Platelet Count: 193 K/uL (ref 150–400)
RBC: 5.71 MIL/uL (ref 4.22–5.81)
RDW: 14.4 % (ref 11.5–15.5)
WBC Count: 6 K/uL (ref 4.0–10.5)
nRBC: 0 % (ref 0.0–0.2)

## 2024-04-23 LAB — CMP (CANCER CENTER ONLY)
ALT: 42 U/L (ref 0–44)
AST: 34 U/L (ref 15–41)
Albumin: 4.3 g/dL (ref 3.5–5.0)
Alkaline Phosphatase: 96 U/L (ref 38–126)
Anion gap: 12 (ref 5–15)
BUN: 8 mg/dL (ref 8–23)
CO2: 24 mmol/L (ref 22–32)
Calcium: 9.4 mg/dL (ref 8.9–10.3)
Chloride: 101 mmol/L (ref 98–111)
Creatinine: 1.1 mg/dL (ref 0.61–1.24)
GFR, Estimated: >60 mL/min (ref >60)
Glucose, Bld: 111 mg/dL — ABNORMAL HIGH (ref 70–99)
Potassium: 4.3 mmol/L (ref 3.5–5.1)
Sodium: 137 mmol/L (ref 135–145)
Total Bilirubin: 0.4 mg/dL (ref 0.0–1.2)
Total Protein: 7.2 g/dL (ref 6.5–8.1)

## 2024-04-23 LAB — TOTAL PROTEIN, URINE DIPSTICK: Protein, ur: NEGATIVE mg/dL

## 2024-04-23 MED ORDER — SODIUM CHLORIDE 0.9 % IV SOLN
5.0000 mg/kg | Freq: Once | INTRAVENOUS | Status: AC
  Administered 2024-04-23: 500 mg via INTRAVENOUS
  Filled 2024-04-23: qty 16

## 2024-04-23 MED ORDER — FLUOROURACIL CHEMO INJECTION 2.5 GM/50ML
400.0000 mg/m2 | Freq: Once | INTRAVENOUS | Status: AC
  Administered 2024-04-23: 900 mg via INTRAVENOUS
  Filled 2024-04-23: qty 18

## 2024-04-23 MED ORDER — DEXTROSE 5 % IV SOLN: INTRAVENOUS | Status: DC

## 2024-04-23 MED ORDER — LEUCOVORIN CALCIUM INJECTION 350 MG
400.0000 mg/m2 | Freq: Once | INTRAVENOUS | Status: AC
  Administered 2024-04-23: 880 mg via INTRAVENOUS
  Filled 2024-04-23: qty 44

## 2024-04-23 MED ORDER — SODIUM CHLORIDE 0.9% FLUSH: 10.0000 mL | INTRAVENOUS | Status: DC | PRN

## 2024-04-23 MED ORDER — DEXAMETHASONE SODIUM PHOSPHATE 10 MG/ML IJ SOLN
10.0000 mg | Freq: Once | INTRAMUSCULAR | Status: AC
  Administered 2024-04-23: 10 mg via INTRAVENOUS
  Filled 2024-04-23: qty 1

## 2024-04-23 MED ORDER — PALONOSETRON HCL INJECTION 0.25 MG/5ML
0.2500 mg | Freq: Once | INTRAVENOUS | Status: AC
  Administered 2024-04-23: 0.25 mg via INTRAVENOUS
  Filled 2024-04-23: qty 5

## 2024-04-23 MED ORDER — NYSTATIN 100000 UNIT/ML MT SUSP
5.0000 mL | OROMUCOSAL | 6 refills | Status: AC | PRN
  Filled 2024-04-23: qty 500, 17d supply, fill #0
  Filled 2024-05-21: qty 500, 17d supply, fill #1

## 2024-04-23 MED ORDER — SODIUM CHLORIDE 0.9 % IV SOLN: INTRAVENOUS | Status: DC

## 2024-04-23 MED ORDER — OXALIPLATIN CHEMO INJECTION 100 MG/20ML
85.0000 mg/m2 | Freq: Once | INTRAVENOUS | Status: AC
  Administered 2024-04-23: 200 mg via INTRAVENOUS
  Filled 2024-04-23: qty 40

## 2024-04-23 MED ORDER — SODIUM CHLORIDE 0.9 % IV SOLN
2400.0000 mg/m2 | INTRAVENOUS | Status: DC
  Administered 2024-04-23: 5000 mg via INTRAVENOUS
  Filled 2024-04-23: qty 100

## 2024-04-23 NOTE — Progress Notes (Signed)
 Patient seen by Dr. Archie Patten Pasam today  Vitals are within treatment parameters:Yes   Labs are within treatment parameters: Yes   Treatment plan has been signed: Yes   Per physician team, Patient is ready for treatment and there are NO modifications to the treatment plan.

## 2024-04-23 NOTE — Assessment & Plan Note (Signed)
 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  2 small perirectal nodes measuring 3 to 4 mm  Lower EUS 11/13/2019-tumor in the proximal rectum.  Proximal posterolateral rectal mass.  2 abnormal lymph nodes in the perirectal region.  Staged T3N1 by endorectal ultrasound.    Previously he was seen by Dr. Cloretta in our clinic.  He received treatments with Xeloda /radiation 11/25/2019-01/01/2020  He was supposed to undergo surgical resection under the direction of Dr. Debby in June 2021.  Patient apparently had dental infections around that time and needed dental extractions and patient deferred surgery and did not seek follow-up care either with the surgeons or with our department.  He also did not have follow-up appointments with gastroenterology.  His PCP referred him back to his and he reestablished care with us  on 01/30/2024.  CEA was normal at 1.68.  CBCD and CMP were unremarkable.  Plan made for restaging CT scan of the chest, abdomen and pelvis and referral sent to GI for endoscopic evaluation.  Patient cannot get MRIs because of nerve stimulator in place.  On 02/08/2024, restaging CT chest, abdomen and pelvis showed multiple lung nodules, largest 1 measuring 1.9 cm, concerning for pulmonary metastatic disease.  Previously seen circumferential superior rectal mass was no longer distinctly visible.  No evidence of lymphadenopathy or metastatic disease in the abdomen or pelvis.  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  No evidence of metastatic adenopathy in the chest or evidence of other metastatic disease in the abdomen or pelvis.  On 02/27/2024, Dr. Shelah performed flexible  video fiberoptic bronchoscopy with robotic assistance and biopsies of right upper lobe lung nodule.  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  Immunostains were positive for CK20 and CDX2, negative for CK7 and TTF-1, consistent with a colorectal primary.  cTx,cNx,pM1a, Stage IV A disease.   Previously submitted request for NGS testing on the specimen.  All treatment options are palliative in nature, given stage IV disease.    He had an appointment on 03/15/2024 with cardiothoracic surgery Dr. Shyrl for possible resection of the lung lesions:  he likely is out of his radiation window to undergo rectal cancer resection. If this cannot be resected there is minimal advantage to resect the pulmonary nodules   Palliative systemic treatment with FOLFOX started from 03/28/2024.  Avastin  was added from cycle 2 onwards.   Tolerating chemotherapy reasonably well overall with some expected side effects including mouth sores.  Symptoms controlled with supportive therapy including mouth rinses with baking soda and salt.  We prescribed Magic mouthwash today.  Due for cycle 3 of chemotherapy today.  Labs reveal no dose-limiting toxicities.  Will proceed with FOLFOX plus Avastin  at standard dosing.  If progressive neuropathy or other side effects are noted, we will dose reduce oxaliplatin , followed by 5-FU.

## 2024-04-23 NOTE — Progress Notes (Signed)
 [facility]  ONCOLOGY CLINIC PROGRESS NOTE   Patient Care Team: Loring Tanda Mae, MD as PCP - General (Family Medicine) Thukkani, Arun K, MD as PCP - Cardiology (Cardiology) Babs Arthea DASEN, MD as Consulting Physician (Physical Medicine and Rehabilitation) Debby Fidela CROME, NP as Nurse Practitioner (Physical Medicine and Rehabilitation) Lennard Lesta FALCON, MD as Consulting Physician (Gastroenterology)  PATIENT NAME: Derrick Johnson   MR#: [id number] DOB: [birth year]  Date of visit: 04/23/2024   ASSESSMENT & PLAN:   BRAND SIEVER is a 68 y.o. gentleman with a past medical history of rectal cancer diagnosed in February 2021, treated with concurrent chemoradiation using Xeloda , completed treatment in April 2021, patient did not follow-up with surgery or with oncology departments after this. 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 He was referred back to our clinic by his PCP to reestablish care for his history of rectal cancer.  He has recurrence of disease with biopsy-proven lung mets.  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  2 small perirectal nodes measuring 3 to 4 mm  Lower EUS 11/13/2019-tumor in the proximal rectum.  Proximal posterolateral rectal mass.  2 abnormal lymph nodes in the perirectal region.  Staged T3N1 by endorectal ultrasound.    Previously he was seen by Dr. Cloretta in our clinic.  He received treatments with Xeloda /radiation 11/25/2019-01/01/2020  He was supposed to undergo surgical resection under the direction of Dr. Debby  in June 2021.  Patient apparently had dental infections around that time and needed dental extractions and patient deferred surgery and did not seek follow-up care either with the surgeons or with our department.  He also did not have follow-up appointments with gastroenterology.  His PCP referred him back to his and he reestablished care with us  on 01/30/2024.  CEA was normal at 1.68.  CBCD and CMP were unremarkable.  Plan made for restaging CT scan of the chest, abdomen and pelvis and referral sent to GI for endoscopic evaluation.  Patient cannot get MRIs because of nerve stimulator in place.  On 02/08/2024, restaging CT chest, abdomen and pelvis showed multiple lung nodules, largest 1 measuring 1.9 cm, concerning for pulmonary metastatic disease.  Previously seen circumferential superior rectal mass was no longer distinctly visible.  No evidence of lymphadenopathy or metastatic disease in the abdomen or pelvis.  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  No evidence of metastatic adenopathy in the chest or evidence of other metastatic disease in the abdomen or pelvis.  On 02/27/2024, Dr. Shelah performed flexible video fiberoptic bronchoscopy with robotic assistance and biopsies of right upper lobe lung nodule.  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  Immunostains were positive for CK20 and CDX2, negative for CK7 and TTF-1, consistent with a colorectal primary.  cTx,cNx,pM1a, Stage IV A disease.   Previously submitted request for NGS testing on the specimen.  All treatment options are palliative in nature, given stage IV disease.    He had an appointment on 03/15/2024 with cardiothoracic surgery Dr. Shyrl for possible resection of the lung lesions:  he likely is out of his radiation  window to undergo rectal cancer resection. If this cannot be resected there is minimal  advantage to resect the pulmonary nodules   Palliative systemic treatment with FOLFOX started from 03/28/2024.  Avastin  was added from cycle 2 onwards.   Tolerating chemotherapy reasonably well overall with some expected side effects including mouth sores.  Symptoms controlled with supportive therapy including mouth rinses with baking soda and salt.  We prescribed Magic mouthwash today.  Due for cycle 3 of chemotherapy today.  Labs reveal no dose-limiting toxicities.  Will proceed with FOLFOX plus Avastin  at standard dosing.  If progressive neuropathy or other side effects are noted, we will dose reduce oxaliplatin , followed by 5-FU.  Chemotherapy-induced peripheral neuropathy Reports cold sensitivity and tingling numbness, which persisted longer after the second cycle. Acknowledges as a manageable side effect of treatment.  Chemotherapy-induced oral mucositis Mouth sores have improved with baking soda and salt rinses. Dentures may have contributed to ulceration. - Send prescription for mouthwash to pharmacy for pickup  History of myocardial infarction Previous myocardial infarction noted. Discussion of potential heart risks with chemotherapy, but no current contraindications for treatment. - Monitor for any cardiac symptoms during chemotherapy  Chronic Migraine Chronic migraines with severe episodes lasting three to four days a week. - CT head on 03/13/2024 showed no evidence of metastatic disease.  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  I reviewed lab results and outside records for this visit and discussed relevant results with the patient. Diagnosis, plan of care and treatment options were also discussed in detail with the patient. Opportunity provided to ask questions and answers provided to his apparent satisfaction. Provided instructions to call our clinic with any problems, questions or concerns prior to return visit. I recommended to continue follow-up with PCP  and sub-specialists. He verbalized understanding and agreed with the plan.   NCCN guidelines have been consulted in the planning of this patient's care.  I spent a total of 40 minutes during this encounter with the patient including review of chart and various tests results, discussions about plan of care and coordination of care plan.   [name], MD  04/23/2024 4:29 PM  [facility] - A DEPT OF [name] [address] [place] [address] Dept: [phone] Dept Fax: [phone]    CHIEF COMPLAINT/ REASON FOR VISIT:   Rectal cancer, initially diagnosed and treated in 2021 with concurrent chemoradiation using Xeloda .  Lost to follow-up.  Now with metastatic recurrence in the lungs, biopsy-proven in June 2025.  Current Treatment: Palliative systemic treatment with FOLFOX started from 03/28/2024.  Avastin  was added from cycle 2 onwards.   INTERVAL HISTORY:    Discussed the use of AI scribe software for clinical note transcription with the patient, who gave verbal consent to proceed.  History of Present Illness  Demosthenes Virnig is a 68 year old male with cancer who presents for chemotherapy treatment.  He is undergoing chemotherapy and recently started on Avastin  from cycle two. He tolerates the treatment well, though he experiences increased nausea and cold sensitivity. Tingling numbness, which previously resolved quickly, lingered longer this time. He feels tired and weak, affecting his ability to talk.  He has been experiencing mouth sores, which improved with baking soda and salt rinses. Eating is difficult due to mouth discomfort and dentures, often requiring him to remove them to eat.  He reports neck pain, which he managed with ice packs. He tried medication  for this issue, but found ice packs more effective, significantly reducing the pain by the next evening.  He has been dealing with  gastrointestinal issues, including hemorrhoids and prolapse, which he attributes to medication side effects. He takes Benefiber up to three times a day, but is trying to regulate it to avoid diarrhea.  He experienced bruising last week, which has mostly resolved over the past month. He notes that he bruises easily.   I have reviewed the past medical history, past surgical history, social history and family history with the patient and they are unchanged from previous note.  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  2 small perirectal nodes measuring 3 to 4 mm   Lower EUS 11/13/2019-tumor in the proximal rectum.  Proximal posterolateral rectal mass.  2 abnormal lymph nodes in the perirectal region.  Staged T3N1 by endorectal ultrasound.     Xeloda /radiation 11/25/2019-01/01/2020   He was supposed to undergo surgical resection under the direction of Dr. Debby in June 2021.  Patient apparently had dental infections around that time and needed dental extractions and patient deferred surgery and did not seek follow-up care either with the surgeons or with our department.  He also did not have follow-up appointments with gastroenterology.   His PCP referred him back to his and he reestablished care with us  on 01/30/2024.  CEA was normal at 1.68.  CBCD and CMP were unremarkable.  Plan made for restaging CT scan of the chest, abdomen and pelvis and referral sent to GI for endoscopic evaluation.  Patient cannot get MRIs because of nerve stimulator in place.   On 02/08/2024, restaging CT chest, abdomen and pelvis showed multiple lung nodules, largest 1 measuring 1.9 cm, concerning for pulmonary metastatic disease.  Previously seen circumferential  superior rectal mass was no longer distinctly visible.  No evidence of lymphadenopathy or metastatic disease in the abdomen or pelvis.   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  No evidence of metastatic adenopathy in the chest or evidence of other metastatic disease in the abdomen or pelvis.  On 02/27/2024, Dr. Shelah performed flexible video fiberoptic bronchoscopy with robotic assistance and biopsies of right upper lobe lung nodule.  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  Immunostains were positive for CK20 and CDX2, negative for CK7 and TTF-1, consistent with a colorectal primary.  cTx,cNx,pM1a, Stage IV A disease.   All treatment options are palliative in nature, given stage IV disease.    He had an appointment on 03/15/2024 with cardiothoracic surgery Dr. Shyrl for possible resection of the lung lesions:  he likely is out of his radiation window to undergo rectal cancer resection. If this cannot be resected there is minimal advantage to resect the pulmonary nodules   Palliative systemic treatment with FOLFOX started from 03/28/2024.  Avastin  was added from cycle 2 onwards.    Oncology History  Adenocarcinoma of rectum (HCC)  11/07/2019 Initial Diagnosis   Adenocarcinoma of rectum (HCC)   03/12/2024 Cancer Staging   Staging form: Colon and Rectum, AJCC 8th Edition - Clinical stage from 03/12/2024: Stage IVA (rcTX, rcNX, rpM1a) - Signed by Autumn Millman, MD on 03/12/2024 Stage prefix: Recurrence   03/28/2024 -  Chemotherapy  Patient is on Treatment Plan : COLORECTAL FOLFOX + Bevacizumab  q14d         REVIEW OF SYSTEMS:   Review of Systems - Oncology  All other pertinent systems were reviewed with the patient and are negative.  ALLERGIES: He is allergic to latex.  MEDICATIONS:  Current Outpatient Medications  Medication Sig Dispense Refill    albuterol  (PROVENTIL ) (2.5 MG/3ML) 0.083% nebulizer solution Take 2.5 mg by nebulization every 6 (six) hours as needed for wheezing or shortness of breath.      albuterol  (VENTOLIN  HFA) 108 (90 Base) MCG/ACT inhaler Inhale 1-2 puffs into the lungs every 6 (six) hours as needed for wheezing or shortness of breath. 1 Inhaler 5   aspirin  81 MG chewable tablet Chew 1 tablet (81 mg total) by mouth daily. 90 tablet 3   atorvastatin  (LIPITOR) 80 MG tablet Take 1 tablet (80 mg total) by mouth daily. 90 tablet 3   clopidogrel  (PLAVIX ) 75 MG tablet Take 1 tablet (75 mg total) by mouth daily. Okay to restart this medication on 02/28/2024.     cyclobenzaprine (FLEXERIL) 10 MG tablet Take 10 mg by mouth every 8 (eight) hours as needed for muscle spasms.     dexamethasone  (DECADRON ) 4 MG tablet Take 2 tablets (8 mg total) by mouth daily. Start the day after chemotherapy for 2 days. Take with food. 30 tablet 1   furosemide  (LASIX ) 20 MG tablet Take 1 tablet (20 mg total) by mouth daily. 90 tablet 3   lidocaine -prilocaine  (EMLA ) cream Apply to affected area once 30 g 3   methadone  (DOLOPHINE ) 10 MG tablet Take 1 tablet (10 mg total) by mouth in the morning, at noon, in the evening, and at bedtime.     methylphenidate (RITALIN) 20 MG tablet Take 20 mg by mouth 3 (three) times daily.     metoprolol  succinate (TOPROL -XL) 25 MG 24 hr tablet Take 1 tablet (25 mg total) by mouth at bedtime.     nitroGLYCERIN  (NITROSTAT ) 0.4 MG SL tablet Place 1 tablet (0.4 mg total) under the tongue every 5 (five) minutes x 3 doses as needed for chest pain. 25 tablet 3   ondansetron  (ZOFRAN ) 8 MG tablet Take 1 tablet (8 mg total) by mouth every 8 (eight) hours as needed for nausea or vomiting. Start on the third day after chemotherapy. 30 tablet 1   polyethylene glycol (MIRALAX) 17 g packet Take 17 g by mouth daily.     prochlorperazine  (COMPAZINE ) 10 MG tablet Take 1 tablet (10 mg total) by mouth every 6 (six) hours as needed for nausea or  vomiting. 30 tablet 1   sacubitril-valsartan (ENTRESTO ) 24-26 MG Take 1 tablet by mouth 2 (two) times daily. 60 tablet 11   spironolactone  (ALDACTONE ) 25 MG tablet Take 0.5 tablets (12.5 mg total) by mouth daily. 45 tablet 3   SUMAtriptan  (IMITREX ) 100 MG tablet Take 100 mg by mouth every 2 (two) hours as needed for migraine.     tamsulosin (FLOMAX) 0.4 MG CAPS capsule Take 0.4-0.8 mg by mouth See admin instructions. Take 0.4mg  (1 capsule) by mouth daily for 2 weeks, then increase to 0.8mg  (2 capsules) daily,     traZODone  (DESYREL ) 150 MG tablet Take 0.5 tablets (75 mg total) by mouth at bedtime as needed for sleep.     VITAMIN D -VITAMIN K PO Take 1 capsule by mouth daily. 10,000IU/ 100 mcg     Wheat Dextrin (BENEFIBER PO) Take 1 tablet by mouth 3 (three) times daily as needed (  Constipation per wating report).     magic mouthwash (nystatin , lidocaine , diphenhydrAMINE, alum & mag hydroxide) suspension Take 5 mLs by mouth every 4 (four) hours as needed for mouth pain. 500 mL 6   Multiple Vitamin (MULTIVITAMIN) capsule Take 1 capsule by mouth daily. (Patient not taking: Reported on 04/23/2024)     No current facility-administered medications for this visit.   Facility-Administered Medications Ordered in Other Visits  Medication Dose Route Frequency Provider Last Rate Last Admin   0.9 %  sodium chloride  infusion   Intravenous Continuous P[place]oua Schreiner, MD   Stopped at 04/23/24 1212   clindamycin  (CLEOCIN ) 900 mg in dextrose  5 % 50 mL IVPB  900 mg Intravenous 60 min Pre-Op Debby Hila, MD       And   gentamicin  (GARAMYCIN ) 5 mg/kg in dextrose  5 % 50 mL IVPB  5 mg/kg Intravenous 60 min Pre-Op Debby Hila, MD       dextrose  5 % solution   Intravenous Continuous Aarushi Hemric, MD   Stopped at 04/23/24 1439   fluorouracil  (ADRUCIL ) 5,000 mg in sodium chloride  0.9 % 150 mL chemo infusion  2,400 mg/m2 (Treatment Plan Recorded) Intravenous 1 day or 1 dose Kalee Mcclenathan, MD   Infusion Verify at  04/23/24 1624   sodium chloride  flush (NS) 0.9 % injection 10 mL  10 mL Intracatheter PRN Kaleia Longhi, MD         VITALS:   Blood pressure 125/88, pulse 67, temperature 98 F (36.7 C), temperature source Temporal, resp. rate 18, height 5' 9 (1.753 m), weight 219 lb 3.2 oz (99.4 kg), SpO2 97%.  Wt Readings from Last 3 Encounters:  04/23/24 219 lb 3.2 oz (99.4 kg)  04/09/24 222 lb 6.4 oz (100.9 kg)  03/28/24 221 lb 11.2 oz (100.6 kg)    Body mass index is 32.37 kg/m.    Onc Performance Status - 04/23/24 0945       ECOG Perf Status   ECOG Perf Status Restricted in physically strenuous activity but ambulatory and able to carry out work of a light or sedentary nature, e.g., light house work, office work (P)       KPS SCALE   KPS % SCORE Cares for self, unable to carry on normal activity or to do active work (P)            PHYSICAL EXAM:   Physical Exam Constitutional:      General: He is not in acute distress.    Appearance: Normal appearance.  HENT:     Head: Normocephalic and atraumatic.  Eyes:     Conjunctiva/sclera: Conjunctivae normal.  Cardiovascular:     Rate and Rhythm: Normal rate and regular rhythm.  Pulmonary:     Effort: Pulmonary effort is normal. No respiratory distress.  Abdominal:     General: There is no distension.  Neurological:     General: No focal deficit present.     Mental Status: He is alert and oriented to person, place, and time.  Psychiatric:        Mood and Affect: Mood normal.        Behavior: Behavior normal.        LABORATORY DATA:   I have reviewed the data as listed.  Results for orders placed or performed in visit on 04/23/24  Total Protein, Urine dipstick  Result Value Ref Range   Protein, ur NEGATIVE NEGATIVE mg/dL  CMP (Cancer Center only)  Result Value Ref Range   Sodium 137 135 -  145 mmol/L   Potassium 4.3 3.5 - 5.1 mmol/L   Chloride 101 98 - 111 mmol/L   CO2 24 22 - 32 mmol/L   Glucose, Bld 111 (H) 70 -  99 mg/dL   BUN 8 8 - 23 mg/dL   Creatinine 8.89 9.38 - 1.24 mg/dL   Calcium  9.4 8.9 - 10.3 mg/dL   Total Protein 7.2 6.5 - 8.1 g/dL   Albumin 4.3 3.5 - 5.0 g/dL   AST 34 15 - 41 U/L   ALT 42 0 - 44 U/L   Alkaline Phosphatase 96 38 - 126 U/L   Total Bilirubin 0.4 0.0 - 1.2 mg/dL   GFR, Estimated >39 >39 mL/min   Anion gap 12 5 - 15  CBC with Differential (Cancer Center Only)  Result Value Ref Range   WBC Count 6.0 4.0 - 10.5 K/uL   RBC 5.71 4.22 - 5.81 MIL/uL   Hemoglobin 16.3 13.0 - 17.0 g/dL   HCT 52.0 60.9 - 47.9 %   MCV 83.9 80.0 - 100.0 fL   MCH 28.5 26.0 - 34.0 pg   MCHC 34.0 30.0 - 36.0 g/dL   RDW 85.5 88.4 - 84.4 %   Platelet Count 193 150 - 400 K/uL   nRBC 0.0 0.0 - 0.2 %   Neutrophils Relative % 68 %   Neutro Abs 4.1 1.7 - 7.7 K/uL   Lymphocytes Relative 13 %   Lymphs Abs 0.8 0.7 - 4.0 K/uL   Monocytes Relative 15 %   Monocytes Absolute 0.9 0.1 - 1.0 K/uL   Eosinophils Relative 2 %   Eosinophils Absolute 0.1 0.0 - 0.5 K/uL   Basophils Relative 1 %   Basophils Absolute 0.0 0.0 - 0.1 K/uL   Immature Granulocytes 1 %   Abs Immature Granulocytes 0.03 0.00 - 0.07 K/uL      RADIOGRAPHIC STUDIES:  No recent pertinent imaging available to review.  CODE STATUS:  Code Status History     Date Active Date Inactive Code Status Order ID Comments User Context   11/27/2023 2206 11/29/2023 1747 Full Code [id number]  Cesario Chamorro, MD Inpatient    Questions for Most Recent Historical Code Status (Order [id number])     Question Answer   By: Consent: discussion documented in EHR            Orders Placed This Encounter  Procedures   CBC with Differential (Cancer Center Only)    Standing Status:   Future    Expected Date:   05/07/2024    Expiration Date:   05/07/2025   CMP (Cancer Center only)    Standing Status:   Future    Expected Date:   05/07/2024    Expiration Date:   05/07/2025   CBC with Differential (Cancer Center Only)    Standing Status:   Future    Expected  Date:   05/21/2024    Expiration Date:   05/21/2025   CMP (Cancer Center only)    Standing Status:   Future    Expected Date:   05/21/2024    Expiration Date:   05/21/2025     Future Appointments  Date Time Provider Department Center  04/25/2024  1:00 PM [facility] INFUSION [facility] None  05/07/2024  8:45 AM [facility] INFUSION [facility] None  05/07/2024  8:45 AM [facility] PHLEBOTOMIST [facility] None  05/07/2024  9:15 AM Renji Berwick, MD [facility] None  05/07/2024 10:00 AM [facility] INFUSION [facility] None  05/09/2024  2:00 PM [facility] INFUSION [facility] None  05/28/2024  1:10 PM Chandra Toribio POUR, MD [facility] None      This document was completed utilizing speech recognition software. Grammatical errors, random word insertions, pronoun errors, and incomplete sentences are an occasional consequence of this system due to software limitations, ambient noise, and hardware issues. Any formal questions or concerns about the content, text or information contained within the body of this dictation should be directly addressed to the provider for clarification.

## 2024-04-23 NOTE — Patient Instructions (Signed)

## 2024-04-23 NOTE — Patient Instructions (Signed)
 [facility] A DEPT OF [facility]  Discharge Instructions: Thank you for choosing [facility] to provide your oncology and hematology care.   If you have a lab appointment with the Cancer Center, please go directly to the Cancer Center and check in at the registration area.   Wear comfortable clothing and clothing appropriate for easy access to any Portacath or PICC line.   We strive to give you quality time with your provider. You may need to reschedule your appointment if you arrive late (15 or more minutes).  Arriving late affects you and other patients whose appointments are after yours.  Also, if you miss three or more appointments without notifying the office, you may be dismissed from the clinic at the provider's discretion.      For prescription refill requests, have your pharmacy contact our office and allow 72 hours for refills to be completed.    Today you received the following chemotherapy and/or immunotherapy agents: bevacizumab, oxaliplatin, leucovorin, fluorouracil      To help prevent nausea and vomiting after your treatment, we encourage you to take your nausea medication as directed.  BELOW ARE SYMPTOMS THAT SHOULD BE REPORTED IMMEDIATELY: *FEVER GREATER THAN 100.4 F (38 C) OR HIGHER *CHILLS OR SWEATING *NAUSEA AND VOMITING THAT IS NOT CONTROLLED WITH YOUR NAUSEA MEDICATION *UNUSUAL SHORTNESS OF BREATH *UNUSUAL BRUISING OR BLEEDING *URINARY PROBLEMS (pain or burning when urinating, or frequent urination) *BOWEL PROBLEMS (unusual diarrhea, constipation, pain near the anus) TENDERNESS IN MOUTH AND THROAT WITH OR WITHOUT PRESENCE OF ULCERS (sore throat, sores in mouth, or a toothache) UNUSUAL RASH, SWELLING OR PAIN  UNUSUAL VAGINAL DISCHARGE OR ITCHING   Items with * indicate a potential emergency and should be followed up as soon as possible or go to the Emergency Department if any problems should occur.  Please show the  CHEMOTHERAPY ALERT CARD or IMMUNOTHERAPY ALERT CARD at check-in to the Emergency Department and triage nurse.  Should you have questions after your visit or need to cancel or reschedule your appointment, please contact [facility] - A DEPT OF [facility]  Dept: [phone]  and follow the prompts.  Office hours are 8:00 a.m. to 4:30 p.m. Monday - Friday. Please note that voicemails left after 4:00 p.m. may not be returned until the following business day.  We are closed weekends and major holidays. You have access to a nurse at all times for urgent questions. Please call the main number to the clinic Dept: [phone] and follow the prompts.   For any non-urgent questions, you may also contact your provider using MyChart. We now offer e-Visits for anyone 38 and older to request care online for non-urgent symptoms. For details visit [email or web address].   Also download the MyChart app! Go to the app store, search "MyChart", open the app, select [facility], and log in with your MyChart username and password.

## 2024-04-23 NOTE — Progress Notes (Unsigned)
 Magic Mouthwash ordered for patient due to mouth sores. Rx sent to [facility] outpatient pharmacy.

## 2024-04-24 ENCOUNTER — Other Ambulatory Visit (HOSPITAL_BASED_OUTPATIENT_CLINIC_OR_DEPARTMENT_OTHER): Payer: Self-pay

## 2024-04-25 ENCOUNTER — Inpatient Hospital Stay

## 2024-04-25 VITALS — BP 119/68 | HR 84 | Temp 97.8°F | Resp 20

## 2024-04-25 DIAGNOSIS — C20 Malignant neoplasm of rectum: Secondary | ICD-10-CM

## 2024-04-25 MED ORDER — SODIUM CHLORIDE 0.9% FLUSH: 10.0000 mL | INTRAVENOUS | Status: DC | PRN

## 2024-05-01 NOTE — Telephone Encounter (Signed)
 Error/disregard

## 2024-05-03 ENCOUNTER — Encounter: Payer: Self-pay | Admitting: Oncology

## 2024-05-03 MED FILL — Fluorouracil IV Soln 5 GM/100ML (50 MG/ML): INTRAVENOUS | Qty: 100 | Status: AC

## 2024-05-03 MED FILL — Fluorouracil IV Soln 2.5 GM/50ML (50 MG/ML): INTRAVENOUS | Qty: 18 | Status: AC

## 2024-05-07 ENCOUNTER — Inpatient Hospital Stay

## 2024-05-07 ENCOUNTER — Ambulatory Visit: Admitting: Nurse Practitioner

## 2024-05-07 ENCOUNTER — Inpatient Hospital Stay (HOSPITAL_BASED_OUTPATIENT_CLINIC_OR_DEPARTMENT_OTHER): Admitting: Oncology

## 2024-05-07 ENCOUNTER — Other Ambulatory Visit (HOSPITAL_BASED_OUTPATIENT_CLINIC_OR_DEPARTMENT_OTHER): Payer: Self-pay

## 2024-05-07 ENCOUNTER — Other Ambulatory Visit: Payer: Self-pay

## 2024-05-07 ENCOUNTER — Other Ambulatory Visit

## 2024-05-07 ENCOUNTER — Encounter: Payer: Self-pay | Admitting: Oncology

## 2024-05-07 ENCOUNTER — Ambulatory Visit

## 2024-05-07 ENCOUNTER — Inpatient Hospital Stay: Attending: Oncology

## 2024-05-07 ENCOUNTER — Encounter (HOSPITAL_COMMUNITY): Payer: Self-pay

## 2024-05-07 VITALS — BP 134/97 | HR 86 | Temp 98.0°F | Resp 18 | Ht 69.0 in | Wt 214.4 lb

## 2024-05-07 DIAGNOSIS — C7801 Secondary malignant neoplasm of right lung: Secondary | ICD-10-CM | POA: Insufficient documentation

## 2024-05-07 DIAGNOSIS — Z7952 Long term (current) use of systemic steroids: Secondary | ICD-10-CM | POA: Insufficient documentation

## 2024-05-07 DIAGNOSIS — I252 Old myocardial infarction: Secondary | ICD-10-CM | POA: Diagnosis not present

## 2024-05-07 DIAGNOSIS — C20 Malignant neoplasm of rectum: Secondary | ICD-10-CM

## 2024-05-07 DIAGNOSIS — C7802 Secondary malignant neoplasm of left lung: Secondary | ICD-10-CM | POA: Diagnosis not present

## 2024-05-07 DIAGNOSIS — Z79899 Other long term (current) drug therapy: Secondary | ICD-10-CM | POA: Insufficient documentation

## 2024-05-07 DIAGNOSIS — G620 Drug-induced polyneuropathy: Secondary | ICD-10-CM | POA: Insufficient documentation

## 2024-05-07 DIAGNOSIS — Z8673 Personal history of transient ischemic attack (TIA), and cerebral infarction without residual deficits: Secondary | ICD-10-CM | POA: Insufficient documentation

## 2024-05-07 DIAGNOSIS — Z7982 Long term (current) use of aspirin: Secondary | ICD-10-CM | POA: Diagnosis not present

## 2024-05-07 DIAGNOSIS — R197 Diarrhea, unspecified: Secondary | ICD-10-CM | POA: Diagnosis not present

## 2024-05-07 DIAGNOSIS — G43909 Migraine, unspecified, not intractable, without status migrainosus: Secondary | ICD-10-CM | POA: Insufficient documentation

## 2024-05-07 DIAGNOSIS — T451X5A Adverse effect of antineoplastic and immunosuppressive drugs, initial encounter: Secondary | ICD-10-CM | POA: Insufficient documentation

## 2024-05-07 DIAGNOSIS — Z5111 Encounter for antineoplastic chemotherapy: Secondary | ICD-10-CM | POA: Diagnosis present

## 2024-05-07 DIAGNOSIS — M797 Fibromyalgia: Secondary | ICD-10-CM | POA: Diagnosis not present

## 2024-05-07 DIAGNOSIS — K5903 Drug induced constipation: Secondary | ICD-10-CM | POA: Insufficient documentation

## 2024-05-07 DIAGNOSIS — Z7902 Long term (current) use of antithrombotics/antiplatelets: Secondary | ICD-10-CM | POA: Insufficient documentation

## 2024-05-07 DIAGNOSIS — K5909 Other constipation: Secondary | ICD-10-CM | POA: Diagnosis not present

## 2024-05-07 DIAGNOSIS — B719 Cestode infection, unspecified: Secondary | ICD-10-CM | POA: Diagnosis not present

## 2024-05-07 DIAGNOSIS — K1379 Other lesions of oral mucosa: Secondary | ICD-10-CM | POA: Insufficient documentation

## 2024-05-07 LAB — CBC WITH DIFFERENTIAL (CANCER CENTER ONLY)
Abs Immature Granulocytes: 0.03 K/uL (ref 0.00–0.07)
Basophils Absolute: 0 K/uL (ref 0.0–0.1)
Basophils Relative: 0 %
Eosinophils Absolute: 0.1 K/uL (ref 0.0–0.5)
Eosinophils Relative: 1 %
HCT: 48.5 % (ref 39.0–52.0)
Hemoglobin: 16.6 g/dL (ref 13.0–17.0)
Immature Granulocytes: 0 %
Lymphocytes Relative: 14 %
Lymphs Abs: 0.9 K/uL (ref 0.7–4.0)
MCH: 28.6 pg (ref 26.0–34.0)
MCHC: 34.2 g/dL (ref 30.0–36.0)
MCV: 83.6 fL (ref 80.0–100.0)
Monocytes Absolute: 0.9 K/uL (ref 0.1–1.0)
Monocytes Relative: 14 %
Neutro Abs: 4.8 K/uL (ref 1.7–7.7)
Neutrophils Relative %: 71 %
Platelet Count: 136 K/uL — ABNORMAL LOW (ref 150–400)
RBC: 5.8 MIL/uL (ref 4.22–5.81)
RDW: 16.6 % — ABNORMAL HIGH (ref 11.5–15.5)
WBC Count: 6.8 K/uL (ref 4.0–10.5)
nRBC: 0 % (ref 0.0–0.2)

## 2024-05-07 LAB — CMP (CANCER CENTER ONLY)
ALT: 43 U/L (ref 0–44)
AST: 36 U/L (ref 15–41)
Albumin: 4.2 g/dL (ref 3.5–5.0)
Alkaline Phosphatase: 104 U/L (ref 38–126)
Anion gap: 11 (ref 5–15)
BUN: 6 mg/dL — ABNORMAL LOW (ref 8–23)
CO2: 24 mmol/L (ref 22–32)
Calcium: 9.2 mg/dL (ref 8.9–10.3)
Chloride: 103 mmol/L (ref 98–111)
Creatinine: 1.09 mg/dL (ref 0.61–1.24)
GFR, Estimated: >60 mL/min (ref >60)
Glucose, Bld: 122 mg/dL — ABNORMAL HIGH (ref 70–99)
Potassium: 3.8 mmol/L (ref 3.5–5.1)
Sodium: 138 mmol/L (ref 135–145)
Total Bilirubin: 0.4 mg/dL (ref 0.0–1.2)
Total Protein: 7.1 g/dL (ref 6.5–8.1)

## 2024-05-07 MED ORDER — SODIUM CHLORIDE 0.9 % IV SOLN
5.0000 mg/kg | Freq: Once | INTRAVENOUS | Status: AC
  Administered 2024-05-07: 500 mg via INTRAVENOUS
  Filled 2024-05-07: qty 16

## 2024-05-07 MED ORDER — SODIUM CHLORIDE 0.9 % IV SOLN
2400.0000 mg/m2 | INTRAVENOUS | Status: DC
  Administered 2024-05-07: 5000 mg via INTRAVENOUS
  Filled 2024-05-07: qty 100

## 2024-05-07 MED ORDER — OXALIPLATIN CHEMO INJECTION 100 MG/20ML
85.0000 mg/m2 | Freq: Once | INTRAVENOUS | Status: AC
  Administered 2024-05-07: 200 mg via INTRAVENOUS
  Filled 2024-05-07: qty 40

## 2024-05-07 MED ORDER — DEXAMETHASONE SODIUM PHOSPHATE 10 MG/ML IJ SOLN
10.0000 mg | Freq: Once | INTRAMUSCULAR | Status: AC
  Administered 2024-05-07: 10 mg via INTRAVENOUS
  Filled 2024-05-07: qty 1

## 2024-05-07 MED ORDER — PALONOSETRON HCL INJECTION 0.25 MG/5ML
0.2500 mg | Freq: Once | INTRAVENOUS | Status: AC
  Administered 2024-05-07: 0.25 mg via INTRAVENOUS
  Filled 2024-05-07: qty 5

## 2024-05-07 MED ORDER — FLUOROURACIL CHEMO INJECTION 2.5 GM/50ML
400.0000 mg/m2 | Freq: Once | INTRAVENOUS | Status: AC
  Administered 2024-05-07: 900 mg via INTRAVENOUS
  Filled 2024-05-07: qty 18

## 2024-05-07 MED ORDER — LINACLOTIDE 145 MCG PO CAPS
145.0000 ug | ORAL_CAPSULE | Freq: Every day | ORAL | 3 refills | Status: DC
  Filled 2024-05-07 (×2): qty 30, 30d supply, fill #0
  Filled 2024-06-06: qty 30, 30d supply, fill #1

## 2024-05-07 MED ORDER — LEUCOVORIN CALCIUM INJECTION 350 MG
400.0000 mg/m2 | Freq: Once | INTRAVENOUS | Status: DC
  Filled 2024-05-07: qty 44

## 2024-05-07 MED ORDER — LEUCOVORIN CALCIUM INJECTION 350 MG
400.0000 mg/m2 | Freq: Once | INTRAVENOUS | Status: AC
  Administered 2024-05-07: 880 mg via INTRAVENOUS
  Filled 2024-05-07: qty 44

## 2024-05-07 MED ORDER — DEXTROSE 5 % IV SOLN: INTRAVENOUS | Status: DC

## 2024-05-07 MED ORDER — SODIUM CHLORIDE 0.9 % IV SOLN: INTRAVENOUS | Status: DC

## 2024-05-07 NOTE — Progress Notes (Signed)
 [facility]  ONCOLOGY CLINIC PROGRESS NOTE   Patient Care Team: Loring Tanda Mae, MD as PCP - General (Family Medicine) Thukkani, Arun K, MD as PCP - Cardiology (Cardiology) Babs Arthea DASEN, MD as Consulting Physician (Physical Medicine and Rehabilitation) Debby Fidela CROME, NP as Nurse Practitioner (Physical Medicine and Rehabilitation) Lennard Lesta FALCON, MD as Consulting Physician (Gastroenterology)  PATIENT NAME: Derrick Johnson   MR#: [id number] DOB: 04/28/1956  Date of visit: 05/07/2024   ASSESSMENT & PLAN:   Derrick Johnson is a 68 y.o. gentleman with a past medical history of rectal cancer diagnosed in February 2021, treated with concurrent chemoradiation using Xeloda , completed treatment in April 2021, patient did not follow-up with surgery or with oncology departments after this. 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 He was referred back to our clinic by his PCP to reestablish care for his history of rectal cancer.  He has recurrence of disease with biopsy-proven lung mets.  Adenocarcinoma of rectum [facility]) Please review oncology history for additional details and timeline of events.  Patient was originally diagnosed with rectal cancer in 2021 and was treated with concurrent chemoradiation with Xeloda  with eventual plans for surgery.  He was lost to follow-up after that.  Restaging CT chest abdomen pelvis on 02/08/2024 suggested multiple lung nodules, concerning for metastatic recurrence.  On 02/27/2024, Dr. Shelah performed flexible video fiberoptic bronchoscopy with robotic assistance and biopsies of right upper lobe lung nodule.  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  Immunostains were positive for CK20 and CDX2, negative for CK7 and TTF-1, consistent with a colorectal  primary.  cTx,cNx,pM1a, Stage IV A disease.  MSI stable.  NGS testing on the specimen showed no actionable mutations.  All treatment options are palliative in nature, given stage IV disease.    He had an appointment on 03/15/2024 with cardiothoracic surgery Dr. Shyrl for possible resection of the lung lesions:  he likely is out of his radiation window to undergo rectal cancer resection. If this cannot be resected there is minimal advantage to resect the pulmonary nodules   Palliative systemic treatment with FOLFOX started from 03/28/2024.  Avastin  was added from cycle 2 onwards.   Tolerating chemotherapy reasonably well overall with some expected side effects including mouth sores.  Symptoms controlled with supportive therapy including mouth rinses with baking soda and salt.  We prescribed Magic mouthwash previously.  Due for cycle 4 of chemotherapy today.  Labs reveal no dose-limiting toxicities.  Will proceed with FOLFOX plus Avastin  at standard dosing.  If progressive neuropathy or other side effects are noted, we will dose reduce oxaliplatin , followed by 5-FU.  Chemotherapy-induced peripheral neuropathy Reports cold sensitivity and tingling numbness, which persisted longer after the second cycle. Acknowledges as a manageable side effect of treatment.  Chemotherapy-induced oral mucositis Mouth sores have improved with baking soda and salt rinses. Dentures may have contributed to ulceration. - Sent prescription for mouthwash to pharmacy for pickup  History of myocardial infarction Previous myocardial infarction noted. Discussion of potential heart risks with chemotherapy, but no current contraindications for treatment. - Monitor for any cardiac symptoms during chemotherapy  Chronic Migraine Chronic migraines with severe episodes lasting three to four days a week. - CT head on 03/13/2024 showed no evidence of metastatic disease.  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 Recent severe episode requiring multiple enemas. Current management includes fiber supplements and Miralax. Considering the addition of Linzess  to address opioid-induced constipation. - Prescribe Linzess  for constipation management - Continue fiber supplements and Miralax - Encourage increased fluid intake to 80 ounces per day   I reviewed lab results and outside records for this visit and discussed relevant results with the patient. Diagnosis, plan of care and treatment options were also discussed in detail with the patient. Opportunity provided to ask questions and answers provided to his apparent satisfaction. Provided instructions to call our clinic with any problems, questions or concerns prior to return visit. I recommended to continue follow-up with PCP and sub-specialists. He verbalized understanding and agreed with the plan.   NCCN guidelines have been consulted in the planning of this patient's care.  I spent a total of 40 minutes during this encounter with the patient including review of chart and various tests results, discussions about plan of care and coordination of care plan.   [name], MD  05/07/2024 2:44 PM  [facility] - A DEPT OF [name] [address] [place] [address] Dept: [phone] Dept Fax: [phone]    CHIEF COMPLAINT/ REASON FOR VISIT:   Rectal cancer, initially diagnosed and treated in 2021 with concurrent chemoradiation using Xeloda .  Lost to follow-up.  Now with metastatic recurrence in the lungs, biopsy-proven in June 2025.  Current Treatment: Palliative systemic treatment with FOLFOX started from 03/28/2024.  Avastin  was added from cycle 2 onwards.   INTERVAL HISTORY:    Discussed the use of AI scribe software for clinical  note transcription with the patient, who gave verbal consent to proceed.  History of Present Illness  Derrick Johnson is a 69 year old male with heart failure and cancer who presents with severe constipation.  He has been experiencing severe constipation over the past few weeks, which he describes as 'bad, bad, bad.' He managed to alleviate the constipation with multiple enemas, achieving relief by Saturday. Despite this, the constipation was more severe and prolonged than usual, requiring more effort to resolve. 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  He is currently taking fiber supplements, including Benefiber and Clearlax, and uses Miralax, 17 grams up to three times a day. He is concerned about the impact of his chemotherapy regimen on his bowel habits, although he notes that the chemotherapy typically causes diarrhea rather than constipation. He is consuming whey protein smoothies, which he suspects might contribute to his constipation, although he is unsure. He is trying to maintain hydration but is unsure about how much fluid he should be drinking, as he has received differing advice from his doctors regarding fluid intake.  He has a history of heart failure following a heart attack with a 100% blockage in the left coronary artery. This was discovered around March 2024, and during the same period, he was diagnosed with cancer, which is currently being treated. He is trying to balance the advice from different specialists regarding his heart and cancer treatments.  His appetite is poor, and the constipation significantly impacts his ability to eat and perform daily activities. Despite these challenges, he continues to engage in light exercise and yard work when possible. He also experiences occasional nausea related to his cancer  treatment but feels he is coping well with it. No diarrhea.   I have  reviewed the past medical history, past surgical history, social history and family history with the patient and they are unchanged from previous note.  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  2 small perirectal nodes measuring 3 to 4 mm   Lower EUS 11/13/2019-tumor in the proximal rectum.  Proximal posterolateral rectal mass.  2 abnormal lymph nodes in the perirectal region.  Staged T3N1 by endorectal ultrasound.    Previously he was seen by Dr. Cloretta in our clinic.   Xeloda /radiation 11/25/2019-01/01/2020   He was supposed to undergo surgical resection under the direction of Dr. Debby in June 2021.  Patient apparently had dental infections around that time and needed dental extractions and patient deferred surgery and did not seek follow-up care either with the surgeons or with our department.  He also did not have follow-up appointments with gastroenterology.   His PCP referred him back to his and he reestablished care with us  on 01/30/2024.  CEA was normal at 1.68.  CBCD and CMP were unremarkable.  Plan made for restaging CT scan of the chest, abdomen and pelvis and referral sent to GI for endoscopic evaluation.  Patient cannot get MRIs because of nerve stimulator in place.   On 02/08/2024, restaging CT chest, abdomen and pelvis showed multiple lung nodules, largest 1 measuring 1.9 cm, concerning for pulmonary metastatic disease.  Previously seen circumferential superior rectal mass was no longer distinctly visible.  No evidence of lymphadenopathy or metastatic disease in the abdomen or pelvis.   On 02/23/2024, staging PET scan showed hypermetabolic lung nodules in the right and left upper lobes, most consistent with rectal carcinoma  pulmonary metastasis.  No evidence of metastatic adenopathy in the chest or evidence of other metastatic disease in the abdomen or pelvis.  On 02/27/2024, Dr. Shelah performed flexible video fiberoptic bronchoscopy with robotic assistance and biopsies of right upper lobe lung nodule.  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  Immunostains were positive for CK20 and CDX2, negative for CK7 and TTF-1, consistent with a colorectal primary.  cTx,cNx,pM1a, Stage IV A disease.   All treatment options are palliative in nature, given stage IV disease.    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  MSI stable.  Overall no actionable mutations.  He had an appointment on 03/15/2024 with cardiothoracic surgery Dr. Shyrl for possible resection of the lung lesions:  he likely is out of his radiation window to undergo rectal cancer resection. If this cannot be resected there is minimal advantage to resect the pulmonary nodules   Palliative systemic treatment with FOLFOX started from 03/28/2024.  Avastin  was added from cycle 2 onwards.    Oncology History  Adenocarcinoma of rectum (HCC)  11/07/2019 Initial Diagnosis   Adenocarcinoma of rectum (HCC)   03/12/2024 Cancer Staging   Staging form: Colon and Rectum, AJCC 8th Edition - Clinical stage from 03/12/2024: Stage IVA (rcTX, rcNX, rpM1a) - Signed by Autumn Millman, MD on 03/12/2024 Stage prefix: Recurrence   03/28/2024 -  Chemotherapy   Patient is on Treatment Plan : COLORECTAL FOLFOX + Bevacizumab  q14d         REVIEW OF SYSTEMS:   Review  of Systems - Oncology  All other pertinent systems were reviewed with the patient and are negative.  ALLERGIES: He is allergic to latex.  MEDICATIONS:  Current Outpatient Medications  Medication Sig Dispense Refill   albuterol  (PROVENTIL ) (2.5 MG/3ML) 0.083% nebulizer solution Take 2.5 mg by nebulization every 6 (six) hours as needed for  wheezing or shortness of breath.      albuterol  (VENTOLIN  HFA) 108 (90 Base) MCG/ACT inhaler Inhale 1-2 puffs into the lungs every 6 (six) hours as needed for wheezing or shortness of breath. 1 Inhaler 5   aspirin  81 MG chewable tablet Chew 1 tablet (81 mg total) by mouth daily. 90 tablet 3   atorvastatin  (LIPITOR) 80 MG tablet Take 1 tablet (80 mg total) by mouth daily. 90 tablet 3   clopidogrel  (PLAVIX ) 75 MG tablet Take 1 tablet (75 mg total) by mouth daily. Okay to restart this medication on 02/28/2024.     cyclobenzaprine (FLEXERIL) 10 MG tablet Take 10 mg by mouth every 8 (eight) hours as needed for muscle spasms.     dexamethasone  (DECADRON ) 4 MG tablet Take 2 tablets (8 mg total) by mouth daily. Start the day after chemotherapy for 2 days. Take with food. 30 tablet 1   furosemide  (LASIX ) 20 MG tablet Take 1 tablet (20 mg total) by mouth daily. 90 tablet 3   lidocaine -prilocaine  (EMLA ) cream Apply to affected area once 30 g 3   linaclotide  (LINZESS ) 145 MCG CAPS capsule Take 1 capsule (145 mcg total) by mouth daily before breakfast. 30 capsule 3   magic mouthwash (nystatin , lidocaine , diphenhydrAMINE, alum & mag hydroxide) suspension Take 5 mLs by mouth every 4 (four) hours as needed for mouth pain. 500 mL 6   methadone  (DOLOPHINE ) 10 MG tablet Take 1 tablet (10 mg total) by mouth in the morning, at noon, in the evening, and at bedtime.     methylphenidate (RITALIN) 20 MG tablet Take 20 mg by mouth 3 (three) times daily.     metoprolol  succinate (TOPROL -XL) 25 MG 24 hr tablet Take 1 tablet (25 mg total) by mouth at bedtime.     nitroGLYCERIN  (NITROSTAT ) 0.4 MG SL tablet Place 1 tablet (0.4 mg total) under the tongue every 5 (five) minutes x 3 doses as needed for chest pain. 25 tablet 3   ondansetron  (ZOFRAN ) 8 MG tablet Take 1 tablet (8 mg total) by mouth every 8 (eight) hours as needed for nausea or vomiting. Start on the third day after chemotherapy. 30 tablet 1   polyethylene glycol  (MIRALAX) 17 g packet Take 17 g by mouth daily.     prochlorperazine  (COMPAZINE ) 10 MG tablet Take 1 tablet (10 mg total) by mouth every 6 (six) hours as needed for nausea or vomiting. 30 tablet 1   sacubitril-valsartan (ENTRESTO ) 24-26 MG Take 1 tablet by mouth 2 (two) times daily. 60 tablet 11   spironolactone  (ALDACTONE ) 25 MG tablet Take 0.5 tablets (12.5 mg total) by mouth daily. 45 tablet 3   SUMAtriptan  (IMITREX ) 100 MG tablet Take 100 mg by mouth every 2 (two) hours as needed for migraine.     tamsulosin (FLOMAX) 0.4 MG CAPS capsule Take 0.4-0.8 mg by mouth See admin instructions. Take 0.4mg  (1 capsule) by mouth daily for 2 weeks, then increase to 0.8mg  (2 capsules) daily,     traZODone  (DESYREL ) 150 MG tablet Take 0.5 tablets (75 mg total) by mouth at bedtime as needed for sleep.     VITAMIN D -VITAMIN K PO Take 1  capsule by mouth daily. 10,000IU/ 100 mcg     Wheat Dextrin (BENEFIBER PO) Take 1 tablet by mouth 3 (three) times daily as needed (Constipation per wating report).     No current facility-administered medications for this visit.   Facility-Administered Medications Ordered in Other Visits  Medication Dose Route Frequency Provider Last Rate Last Admin   0.9 %  sodium chloride  infusion   Intravenous Continuous Annalea Alguire, MD   Stopped at 05/07/24 1038   clindamycin  (CLEOCIN ) 900 mg in dextrose  5 % 50 mL IVPB  900 mg Intravenous 60 min Pre-Op Debby Hila, MD       And   gentamicin  (GARAMYCIN ) 5 mg/kg in dextrose  5 % 50 mL IVPB  5 mg/kg Intravenous 60 min Pre-Op Debby Hila, MD       dextrose  5 % solution   Intravenous Continuous Deveon Kisiel, MD   Stopped at 05/07/24 1304   fluorouracil  (ADRUCIL ) 5,000 mg in sodium chloride  0.9 % 150 mL chemo infusion  2,400 mg/m2 (Treatment Plan Recorded) Intravenous 1 day or 1 dose Paden Kuras, MD   Infusion Verify at 05/07/24 1316     VITALS:   Blood pressure (!) 134/97, pulse 86, temperature 98 F (36.7 C), temperature  source Temporal, resp. rate 18, height 5' 9 (1.753 m), weight 214 lb 6.4 oz (97.3 kg), SpO2 97%.  Wt Readings from Last 3 Encounters:  05/07/24 214 lb 6.4 oz (97.3 kg)  04/23/24 219 lb 3.2 oz (99.4 kg)  04/09/24 222 lb 6.4 oz (100.9 kg)    Body mass index is 31.66 kg/m.    Onc Performance Status - 05/07/24 0901       ECOG Perf Status   ECOG Perf Status Restricted in physically strenuous activity but ambulatory and able to carry out work of a light or sedentary nature, e.g., light house work, office work      KPS SCALE   KPS % SCORE Cares for self, unable to carry on normal activity or to do active work            PHYSICAL EXAM:   Physical Exam Constitutional:      General: He is not in acute distress.    Appearance: Normal appearance.  HENT:     Head: Normocephalic and atraumatic.  Eyes:     Conjunctiva/sclera: Conjunctivae normal.  Cardiovascular:     Rate and Rhythm: Normal rate and regular rhythm.  Pulmonary:     Effort: Pulmonary effort is normal. No respiratory distress.  Abdominal:     General: There is no distension.  Neurological:     General: No focal deficit present.     Mental Status: He is alert and oriented to person, place, and time.  Psychiatric:        Mood and Affect: Mood normal.        Behavior: Behavior normal.        LABORATORY DATA:   I have reviewed the data as listed.  Results for orders placed or performed in visit on 05/07/24  CMP (Cancer Center only)  Result Value Ref Range   Sodium 138 135 - 145 mmol/L   Potassium 3.8 3.5 - 5.1 mmol/L   Chloride 103 98 - 111 mmol/L   CO2 24 22 - 32 mmol/L   Glucose, Bld 122 (H) 70 - 99 mg/dL   BUN 6 (L) 8 - 23 mg/dL   Creatinine 8.90 9.38 - 1.24 mg/dL   Calcium  9.2 8.9 - 10.3 mg/dL   Total  Protein 7.1 6.5 - 8.1 g/dL   Albumin 4.2 3.5 - 5.0 g/dL   AST 36 15 - 41 U/L   ALT 43 0 - 44 U/L   Alkaline Phosphatase 104 38 - 126 U/L   Total Bilirubin 0.4 0.0 - 1.2 mg/dL   GFR, Estimated >39  >39 mL/min   Anion gap 11 5 - 15  CBC with Differential (Cancer Center Only)  Result Value Ref Range   WBC Count 6.8 4.0 - 10.5 K/uL   RBC 5.80 4.22 - 5.81 MIL/uL   Hemoglobin 16.6 13.0 - 17.0 g/dL   HCT 51.4 60.9 - 47.9 %   MCV 83.6 80.0 - 100.0 fL   MCH 28.6 26.0 - 34.0 pg   MCHC 34.2 30.0 - 36.0 g/dL   RDW 83.3 (H) 88.4 - 84.4 %   Platelet Count 136 (L) 150 - 400 K/uL   nRBC 0.0 0.0 - 0.2 %   Neutrophils Relative % 71 %   Neutro Abs 4.8 1.7 - 7.7 K/uL   Lymphocytes Relative 14 %   Lymphs Abs 0.9 0.7 - 4.0 K/uL   Monocytes Relative 14 %   Monocytes Absolute 0.9 0.1 - 1.0 K/uL   Eosinophils Relative 1 %   Eosinophils Absolute 0.1 0.0 - 0.5 K/uL   Basophils Relative 0 %   Basophils Absolute 0.0 0.0 - 0.1 K/uL   Immature Granulocytes 0 %   Abs Immature Granulocytes 0.03 0.00 - 0.07 K/uL     RADIOGRAPHIC STUDIES:  No recent pertinent imaging available to review.  CODE STATUS:  Code Status History     Date Active Date Inactive Code Status Order ID Comments User Context   11/27/2023 2206 11/29/2023 1747 Full Code [id number]  Cesario Chamorro, MD Inpatient    Questions for Most Recent Historical Code Status (Order [id number])     Question Answer   By: Consent: discussion documented in EHR            Orders Placed This Encounter  Procedures   CBC with Differential (Cancer Center Only)    Standing Status:   Future    Expected Date:   06/04/2024    Expiration Date:   06/04/2025   CMP (Cancer Center only)    Standing Status:   Future    Expected Date:   06/04/2024    Expiration Date:   06/04/2025   Total Protein, Urine dipstick    Standing Status:   Future    Expected Date:   06/04/2024    Expiration Date:   06/04/2025   CBC with Differential (Cancer Center Only)    Standing Status:   Future    Expected Date:   06/18/2024    Expiration Date:   06/18/2025   CMP (Cancer Center only)    Standing Status:   Future    Expected Date:   06/18/2024    Expiration Date:    06/18/2025   Total Protein, Urine dipstick    Standing Status:   Future    Expected Date:   06/18/2024    Expiration Date:   06/18/2025     Future Appointments  Date Time Provider Department Center  05/09/2024 11:15 AM [facility] INFUSION [facility] None  05/21/2024  8:45 AM [facility] INFUSION [facility] None  05/21/2024  8:45 AM [facility] PHLEBOTOMIST [facility] None  05/21/2024  9:15 AM Shanley Furlough, MD [facility] None  05/21/2024  9:45 AM [facility] INFUSION [facility] None  05/23/2024  2:00 PM [facility] INFUSION [facility] None  05/28/2024  1:10  PM Chandra Toribio POUR, MD [facility]  06/04/2024  8:45 AM [facility] INFUSION [facility] None  06/04/2024  8:45 AM [facility] PHLEBOTOMIST [facility] None  06/04/2024  9:15 AM Cynthia Cogle, Chinita, MD [facility] None  06/04/2024  9:45 AM [facility] INFUSION [facility] None  06/06/2024  2:00 PM [facility] INFUSION [facility] None      This document was completed utilizing speech recognition software. Grammatical errors, random word insertions, pronoun errors, and incomplete sentences are an occasional consequence of this system due to software limitations, ambient noise, and hardware issues. Any formal questions or concerns about the content, text or information contained within the body of this dictation should be directly addressed to the provider for clarification.

## 2024-05-07 NOTE — Assessment & Plan Note (Addendum)
 Please review oncology history for additional details and timeline of events.  Patient was originally diagnosed with rectal cancer in 2021 and was treated with concurrent chemoradiation with Xeloda  with eventual plans for surgery.  He was lost to follow-up after that.  Restaging CT chest abdomen pelvis on 02/08/2024 suggested multiple lung nodules, concerning for metastatic recurrence.  On 02/27/2024, Dr. Shelah performed flexible video fiberoptic bronchoscopy with robotic assistance and biopsies of right upper lobe lung nodule.  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  Immunostains were positive for CK20 and CDX2, negative for CK7 and TTF-1, consistent with a colorectal primary.  cTx,cNx,pM1a, Stage IV A disease.  MSI stable.  NGS testing on the specimen showed no actionable mutations.  All treatment options are palliative in nature, given stage IV disease.    He had an appointment on 03/15/2024 with cardiothoracic surgery Dr. Shyrl for possible resection of the lung lesions:  he likely is out of his radiation window to undergo rectal cancer resection. If this cannot be resected there is minimal advantage to resect the pulmonary nodules   Palliative systemic treatment with FOLFOX started from 03/28/2024.  Avastin  was added from cycle 2 onwards.   Tolerating chemotherapy reasonably well overall with some expected side effects including mouth sores.  Symptoms controlled with supportive therapy including mouth rinses with baking soda and salt.  We prescribed Magic mouthwash previously.  Due for cycle 4 of chemotherapy today.  Labs reveal no dose-limiting toxicities.  Will proceed with FOLFOX plus Avastin  at standard dosing.  If progressive neuropathy or other side effects are noted, we will dose reduce oxaliplatin , followed by 5-FU.

## 2024-05-07 NOTE — Patient Instructions (Signed)
 [facility] A DEPT OF [facility]  Discharge Instructions: Thank you for choosing [facility] to provide your oncology and hematology care.   If you have a lab appointment with the Cancer Center, please go directly to the Cancer Center and check in at the registration area.   Wear comfortable clothing and clothing appropriate for easy access to any Portacath or PICC line.   We strive to give you quality time with your provider. You may need to reschedule your appointment if you arrive late (15 or more minutes).  Arriving late affects you and other patients whose appointments are after yours.  Also, if you miss three or more appointments without notifying the office, you may be dismissed from the clinic at the provider's discretion.      For prescription refill requests, have your pharmacy contact our office and allow 72 hours for refills to be completed.    Today you received the following chemotherapy and/or immunotherapy agents: bevacizumab, oxaliplatin, leucovorin, fluorouracil      To help prevent nausea and vomiting after your treatment, we encourage you to take your nausea medication as directed.  BELOW ARE SYMPTOMS THAT SHOULD BE REPORTED IMMEDIATELY: *FEVER GREATER THAN 100.4 F (38 C) OR HIGHER *CHILLS OR SWEATING *NAUSEA AND VOMITING THAT IS NOT CONTROLLED WITH YOUR NAUSEA MEDICATION *UNUSUAL SHORTNESS OF BREATH *UNUSUAL BRUISING OR BLEEDING *URINARY PROBLEMS (pain or burning when urinating, or frequent urination) *BOWEL PROBLEMS (unusual diarrhea, constipation, pain near the anus) TENDERNESS IN MOUTH AND THROAT WITH OR WITHOUT PRESENCE OF ULCERS (sore throat, sores in mouth, or a toothache) UNUSUAL RASH, SWELLING OR PAIN  UNUSUAL VAGINAL DISCHARGE OR ITCHING   Items with * indicate a potential emergency and should be followed up as soon as possible or go to the Emergency Department if any problems should occur.  Please show the  CHEMOTHERAPY ALERT CARD or IMMUNOTHERAPY ALERT CARD at check-in to the Emergency Department and triage nurse.  Should you have questions after your visit or need to cancel or reschedule your appointment, please contact [facility] - A DEPT OF [facility]  Dept: [phone]  and follow the prompts.  Office hours are 8:00 a.m. to 4:30 p.m. Monday - Friday. Please note that voicemails left after 4:00 p.m. may not be returned until the following business day.  We are closed weekends and major holidays. You have access to a nurse at all times for urgent questions. Please call the main number to the clinic Dept: [phone] and follow the prompts.   For any non-urgent questions, you may also contact your provider using MyChart. We now offer e-Visits for anyone 38 and older to request care online for non-urgent symptoms. For details visit [email or web address].   Also download the MyChart app! Go to the app store, search "MyChart", open the app, select [facility], and log in with your MyChart username and password.

## 2024-05-07 NOTE — Progress Notes (Signed)
 Patient seen by Dr. Archie Patten Pasam today  Vitals are within treatment parameters:Yes   Labs are within treatment parameters: Yes   Treatment plan has been signed: Yes   Per physician team, Patient is ready for treatment and there are NO modifications to the treatment plan.

## 2024-05-08 ENCOUNTER — Other Ambulatory Visit: Payer: Self-pay

## 2024-05-09 ENCOUNTER — Inpatient Hospital Stay

## 2024-05-09 ENCOUNTER — Encounter

## 2024-05-09 VITALS — BP 129/79 | HR 89 | Temp 97.6°F | Resp 18

## 2024-05-09 DIAGNOSIS — C20 Malignant neoplasm of rectum: Secondary | ICD-10-CM

## 2024-05-09 NOTE — Patient Instructions (Signed)

## 2024-05-10 ENCOUNTER — Encounter: Payer: Self-pay | Admitting: Oncology

## 2024-05-11 ENCOUNTER — Other Ambulatory Visit: Payer: Self-pay

## 2024-05-20 MED ORDER — SODIUM CHLORIDE 0.9 % IV SOLN
2400.0000 mg/m2 | INTRAVENOUS | Status: DC
  Administered 2024-05-21: 5000 mg via INTRAVENOUS
  Filled 2024-05-20: qty 100

## 2024-05-20 MED ORDER — FLUOROURACIL CHEMO INJECTION 2.5 GM/50ML
400.0000 mg/m2 | Freq: Once | INTRAVENOUS | Status: AC
  Administered 2024-05-21: 900 mg via INTRAVENOUS
  Filled 2024-05-20: qty 18

## 2024-05-21 ENCOUNTER — Other Ambulatory Visit: Payer: Self-pay

## 2024-05-21 ENCOUNTER — Inpatient Hospital Stay

## 2024-05-21 ENCOUNTER — Encounter: Payer: Self-pay | Admitting: Oncology

## 2024-05-21 ENCOUNTER — Inpatient Hospital Stay (HOSPITAL_BASED_OUTPATIENT_CLINIC_OR_DEPARTMENT_OTHER): Admitting: Oncology

## 2024-05-21 ENCOUNTER — Other Ambulatory Visit (HOSPITAL_BASED_OUTPATIENT_CLINIC_OR_DEPARTMENT_OTHER): Payer: Self-pay

## 2024-05-21 VITALS — BP 108/78 | HR 57 | Temp 97.2°F | Resp 16 | Wt 211.5 lb

## 2024-05-21 VITALS — BP 111/73 | HR 52 | Temp 97.9°F | Resp 18

## 2024-05-21 DIAGNOSIS — C20 Malignant neoplasm of rectum: Secondary | ICD-10-CM | POA: Diagnosis not present

## 2024-05-21 DIAGNOSIS — Z5111 Encounter for antineoplastic chemotherapy: Secondary | ICD-10-CM | POA: Diagnosis not present

## 2024-05-21 LAB — CMP (CANCER CENTER ONLY)
ALT: 42 U/L (ref 0–44)
AST: 25 U/L (ref 15–41)
Albumin: 4.1 g/dL (ref 3.5–5.0)
Alkaline Phosphatase: 79 U/L (ref 38–126)
Anion gap: 12 (ref 5–15)
BUN: 22 mg/dL (ref 8–23)
CO2: 23 mmol/L (ref 22–32)
Calcium: 9.1 mg/dL (ref 8.9–10.3)
Chloride: 100 mmol/L (ref 98–111)
Creatinine: 1.24 mg/dL (ref 0.61–1.24)
GFR, Estimated: >60 mL/min (ref >60)
Glucose, Bld: 125 mg/dL — ABNORMAL HIGH (ref 70–99)
Potassium: 3.7 mmol/L (ref 3.5–5.1)
Sodium: 135 mmol/L (ref 135–145)
Total Bilirubin: 0.5 mg/dL (ref 0.0–1.2)
Total Protein: 6.7 g/dL (ref 6.5–8.1)

## 2024-05-21 LAB — CBC WITH DIFFERENTIAL (CANCER CENTER ONLY)
Abs Immature Granulocytes: 0.01 K/uL (ref 0.00–0.07)
Basophils Absolute: 0 K/uL (ref 0.0–0.1)
Basophils Relative: 0 %
Eosinophils Absolute: 0 K/uL (ref 0.0–0.5)
Eosinophils Relative: 0 %
HCT: 40.4 % (ref 39.0–52.0)
Hemoglobin: 14.2 g/dL (ref 13.0–17.0)
Immature Granulocytes: 0 %
Lymphocytes Relative: 23 %
Lymphs Abs: 1 K/uL (ref 0.7–4.0)
MCH: 29.1 pg (ref 26.0–34.0)
MCHC: 35.1 g/dL (ref 30.0–36.0)
MCV: 82.8 fL (ref 80.0–100.0)
Monocytes Absolute: 0.6 K/uL (ref 0.1–1.0)
Monocytes Relative: 14 %
Neutro Abs: 2.8 K/uL (ref 1.7–7.7)
Neutrophils Relative %: 63 %
Platelet Count: 192 K/uL (ref 150–400)
RBC: 4.88 MIL/uL (ref 4.22–5.81)
RDW: 16.7 % — ABNORMAL HIGH (ref 11.5–15.5)
WBC Count: 4.5 K/uL (ref 4.0–10.5)
nRBC: 0 % (ref 0.0–0.2)

## 2024-05-21 LAB — TOTAL PROTEIN, URINE DIPSTICK: Protein, ur: NEGATIVE mg/dL

## 2024-05-21 MED ORDER — DEXTROSE 5 % IV SOLN: INTRAVENOUS | Status: DC

## 2024-05-21 MED ORDER — LEUCOVORIN CALCIUM INJECTION 350 MG
400.0000 mg/m2 | Freq: Once | INTRAVENOUS | Status: AC
  Administered 2024-05-21: 880 mg via INTRAVENOUS
  Filled 2024-05-21: qty 44

## 2024-05-21 MED ORDER — SODIUM CHLORIDE 0.9 % IV SOLN
5.0000 mg/kg | Freq: Once | INTRAVENOUS | Status: AC
  Administered 2024-05-21: 500 mg via INTRAVENOUS
  Filled 2024-05-21: qty 16

## 2024-05-21 MED ORDER — PALONOSETRON HCL INJECTION 0.25 MG/5ML
0.2500 mg | Freq: Once | INTRAVENOUS | Status: AC
  Administered 2024-05-21: 0.25 mg via INTRAVENOUS
  Filled 2024-05-21: qty 5

## 2024-05-21 MED ORDER — OXALIPLATIN CHEMO INJECTION 100 MG/20ML
85.0000 mg/m2 | Freq: Once | INTRAVENOUS | Status: AC
  Administered 2024-05-21: 200 mg via INTRAVENOUS
  Filled 2024-05-21: qty 40

## 2024-05-21 MED ORDER — DEXAMETHASONE SODIUM PHOSPHATE 10 MG/ML IJ SOLN
10.0000 mg | Freq: Once | INTRAMUSCULAR | Status: AC
  Administered 2024-05-21: 10 mg via INTRAVENOUS
  Filled 2024-05-21: qty 1

## 2024-05-21 MED ORDER — SODIUM CHLORIDE 0.9 % IV SOLN: INTRAVENOUS | Status: DC

## 2024-05-21 NOTE — Assessment & Plan Note (Signed)
 Please review oncology history for additional details and timeline of events.  Patient was originally diagnosed with rectal cancer in 2021 and was treated with concurrent chemoradiation with Xeloda  with eventual plans for surgery.  He was lost to follow-up after that.  Restaging CT chest abdomen pelvis on 02/08/2024 suggested multiple lung nodules, concerning for metastatic recurrence.  On 02/27/2024, Dr. Shelah performed flexible video fiberoptic bronchoscopy with robotic assistance and biopsies of right upper lobe lung nodule.  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  Immunostains were positive for CK20 and CDX2, negative for CK7 and TTF-1, consistent with a colorectal primary.  cTx,cNx,pM1a, Stage IV A disease.  MSI stable.  NGS testing on the specimen showed no actionable mutations.  All treatment options are palliative in nature, given stage IV disease.    He had an appointment on 03/15/2024 with cardiothoracic surgery Dr. Shyrl for possible resection of the lung lesions:  he likely is out of his radiation window to undergo rectal cancer resection. If this cannot be resected there is minimal advantage to resect the pulmonary nodules   Palliative systemic treatment with FOLFOX started from 03/28/2024.  Avastin  was added from cycle 2 onwards.   Tolerating chemotherapy reasonably well overall with some expected side effects including mouth sores.  Symptoms controlled with supportive therapy including mouth rinses with baking soda and salt.  We prescribed Magic mouthwash previously.  Due for cycle 5 of chemotherapy today.  Labs reveal no dose-limiting toxicities.  Will proceed with FOLFOX plus Avastin  at standard dosing.  If progressive neuropathy or other side effects are noted, we will dose reduce oxaliplatin , followed by 5-FU.  Following completion of 6 cycles of chemotherapy, we will obtain restaging CT chest, abdomen and pelvis.  Request submitted today.

## 2024-05-21 NOTE — Progress Notes (Signed)
 [facility]  ONCOLOGY CLINIC PROGRESS NOTE   Patient Care Team: Loring Tanda Mae, MD as PCP - General (Family Medicine) Thukkani, Arun K, MD as PCP - Cardiology (Cardiology) Babs Arthea DASEN, MD as Consulting Physician (Physical Medicine and Rehabilitation) Debby Fidela CROME, NP as Nurse Practitioner (Physical Medicine and Rehabilitation) Lennard Lesta FALCON, MD as Consulting Physician (Gastroenterology)  PATIENT NAME: Derrick Johnson   MR#: [id number] DOB: 19-Aug-1956  Date of visit: 05/21/2024   ASSESSMENT & PLAN:   Derrick Johnson is a 68 y.o. gentleman with a past medical history of rectal cancer diagnosed in February 2021, treated with concurrent chemoradiation using Xeloda , completed treatment in April 2021, patient did not follow-up with surgery or with oncology departments after this. 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 He was referred back to our clinic by his PCP to reestablish care for his history of rectal cancer.  He has recurrence of disease with biopsy-proven lung mets.  Adenocarcinoma of rectum [facility]) Please review oncology history for additional details and timeline of events.  Patient was originally diagnosed with rectal cancer in 2021 and was treated with concurrent chemoradiation with Xeloda  with eventual plans for surgery.  He was lost to follow-up after that.  Restaging CT chest abdomen pelvis on 02/08/2024 suggested multiple lung nodules, concerning for metastatic recurrence.  On 02/27/2024, Dr. Shelah performed flexible video fiberoptic bronchoscopy with robotic assistance and biopsies of right upper lobe lung nodule.  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  Immunostains were positive for CK20 and CDX2, negative for CK7 and TTF-1, consistent with a colorectal  primary.  cTx,cNx,pM1a, Stage IV A disease.  MSI stable.  NGS testing on the specimen showed no actionable mutations.  All treatment options are palliative in nature, given stage IV disease.    He had an appointment on 03/15/2024 with cardiothoracic surgery Dr. Shyrl for possible resection of the lung lesions:  he likely is out of his radiation window to undergo rectal cancer resection. If this cannot be resected there is minimal advantage to resect the pulmonary nodules   Palliative systemic treatment with FOLFOX started from 03/28/2024.  Avastin  was added from cycle 2 onwards.   Tolerating chemotherapy reasonably well overall with some expected side effects including mouth sores.  Symptoms controlled with supportive therapy including mouth rinses with baking soda and salt.  We prescribed Magic mouthwash previously.  Due for cycle 5 of chemotherapy today.  Labs reveal no dose-limiting toxicities.  Will proceed with FOLFOX plus Avastin  at standard dosing.  If progressive neuropathy or other side effects are noted, we will dose reduce oxaliplatin , followed by 5-FU.  Following completion of 6 cycles of chemotherapy, we will obtain restaging CT chest, abdomen and pelvis.  Request submitted today.  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 Symptoms included major constipation followed by diarrhea, which have since improved. No anemia or other blood abnormalities noted. - Await lab results for confirmation of tapeworm infection. - If confirmed, his PCP to initiate antihelminthic treatment. - Consider pausing chemotherapy for a week or two if antihelminthic treatment is required.  Chemotherapy-induced peripheral neuropathy Reports cold sensitivity and tingling numbness, which persisted longer after the second cycle. Acknowledges as a manageable side effect of treatment.  Chemotherapy-induced oral  mucositis Mouth sores have  improved with baking soda and salt rinses. Dentures may have contributed to ulceration. - Sent prescription for mouthwash to pharmacy for pickup  History of myocardial infarction Previous myocardial infarction noted. Discussion of potential heart risks with chemotherapy, but no current contraindications for treatment. - Monitor for any cardiac symptoms during chemotherapy  Chronic Migraine Chronic migraines with severe episodes lasting three to four days a week. - CT head on 03/13/2024 showed no evidence of metastatic disease.  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   I reviewed lab results and outside records for this visit and discussed relevant results with the patient. Diagnosis, plan of care and treatment options were also discussed in detail with the patient. Opportunity provided to ask questions and answers provided to his apparent satisfaction. Provided instructions to call our clinic with any problems, questions or concerns prior to return visit. I recommended to continue follow-up with PCP and sub-specialists. He verbalized understanding and agreed with the plan.   NCCN guidelines have been consulted in the planning of this patient's care.  I spent a total of 40 minutes during this encounter with the patient including review of chart and various tests results, discussions about plan of care and coordination of care plan.   [name], MD  05/21/2024 10:52 AM  [facility] - A DEPT OF [name] [address] [place] [address] Dept: [phone] Dept Fax: [phone]    CHIEF COMPLAINT/ REASON FOR VISIT:   Rectal cancer, initially diagnosed and treated in 2021 with concurrent chemoradiation using Xeloda .  Lost to follow-up.  Now with metastatic recurrence in the lungs, biopsy-proven in June 2025.  Current Treatment: Palliative systemic treatment  with FOLFOX started from 03/28/2024.  Avastin  was added from cycle 2 onwards.   INTERVAL HISTORY:    Discussed the use of AI scribe software for clinical note transcription with the patient, who gave verbal consent to proceed.  History of Present Illness  Tuff Clabo is a 68 year old male undergoing chemotherapy who presents with gastrointestinal symptoms.  He has been experiencing major constipation followed by severe diarrhea lasting several days, with the diarrhea easing off around Sunday. Constipation has returned for a day, and he plans to resume his constipation medication.  He discovered a 16.5-inch long tapeworm in his stool on Thursday, which he retrieved and took to his doctor. The specimen was sent to the lab for confirmation. He believes there may be more tapeworms, as his bowel and urinary functions improved after the discovery but have since started to revert. He has a history of eating medium-cooked hamburgers but has since been cooking meat thoroughly to 180 degrees.  He is undergoing chemotherapy and reports experiencing mouth sores, which make eating difficult. The sores are described as inflammation and swelling under his dentures, preventing proper eating. He is using a prescribed mouthwash and saltwater gargles to manage the symptoms.  He also experiences tingling and numbness in his hands, which he feels is manageable.  He mentions having migraines and pain, which he attributes to his current treatment regimen.   I have reviewed the past medical history, past surgical history, social history and family history with the patient and they are unchanged from previous note.  HISTORY OF PRESENT ILLNESS:   ONCOLOGY HISTORY:   Colonoscopy 10/29/2019-rectal mass at approximately 10-11 cm above the anus, biopsied and tattooed, pathology confirmed invasive well-differentiated adenocarcinoma, normal mismatch repair protein expression  CTs 11/12/2019-mild mid rectal wall  thickening.  2 small perirectal nodes measuring 3 to 4 mm   Lower EUS 11/13/2019-tumor in the proximal rectum.  Proximal posterolateral rectal mass.  2 abnormal lymph nodes in the perirectal region.  Staged T3N1 by endorectal ultrasound.    Previously he was seen by Dr. Cloretta in our clinic.   Xeloda /radiation 11/25/2019-01/01/2020   He was supposed to undergo surgical resection under the direction of Dr. Debby in June 2021.  Patient apparently had dental infections around that time and needed dental extractions and patient deferred surgery and did not seek follow-up care either with the surgeons or with our department.  He also did not have follow-up appointments with gastroenterology.   His PCP referred him back to his and he reestablished care with us  on 01/30/2024.  CEA was normal at 1.68.  CBCD and CMP were unremarkable.  Plan made for restaging CT scan of the chest, abdomen and pelvis and referral sent to GI for endoscopic evaluation.  Patient cannot get MRIs because of nerve stimulator in place.   On 02/08/2024, restaging CT chest, abdomen and pelvis showed multiple lung nodules, largest 1 measuring 1.9 cm, concerning for pulmonary metastatic disease.  Previously seen circumferential superior rectal mass was no longer distinctly visible.  No evidence of lymphadenopathy or metastatic disease in the abdomen or pelvis.   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  No evidence of metastatic adenopathy in the chest or evidence of other metastatic disease in the abdomen or pelvis.  On 02/27/2024, Dr. Shelah performed flexible video fiberoptic bronchoscopy with robotic assistance and biopsies of right upper lobe lung nodule.  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  Immunostains were positive for CK20 and CDX2, negative for CK7 and TTF-1, consistent with a colorectal  primary.  cTx,cNx,pM1a, Stage IV A disease.   All treatment options are palliative in nature, given stage IV disease.    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  MSI stable.  Overall no actionable mutations.  He had an appointment on 03/15/2024 with cardiothoracic surgery Dr. Shyrl for possible resection of the lung lesions:  he likely is out of his radiation window to undergo rectal cancer resection. If this cannot be resected there is minimal advantage to resect the pulmonary nodules   Palliative systemic treatment with FOLFOX started from 03/28/2024.  Avastin  was added from cycle 2 onwards.    Oncology History  Adenocarcinoma of rectum (HCC)  11/07/2019 Initial Diagnosis   Adenocarcinoma of rectum (HCC)   03/12/2024 Cancer Staging   Staging form: Colon and Rectum, AJCC 8th Edition - Clinical stage from 03/12/2024: Stage IVA (rcTX, rcNX, rpM1a) - Signed by Autumn Millman, MD on 03/12/2024 Stage prefix: Recurrence   03/28/2024 -  Chemotherapy   Patient is on Treatment Plan : COLORECTAL FOLFOX + Bevacizumab  q14d         REVIEW OF SYSTEMS:   Review of Systems - Oncology  All other pertinent systems were reviewed with the patient and are negative.  ALLERGIES: He is allergic to latex.  MEDICATIONS:  Current Outpatient Medications  Medication Sig Dispense Refill   albuterol  (PROVENTIL ) (2.5 MG/3ML) 0.083% nebulizer solution Take 2.5 mg by nebulization every 6 (six) hours as needed for wheezing or shortness of breath.      albuterol  (VENTOLIN  HFA) 108 (90 Base) MCG/ACT inhaler  Inhale 1-2 puffs into the lungs every 6 (six) hours as needed for wheezing or shortness of breath. 1 Inhaler 5   aspirin  81 MG chewable tablet Chew 1 tablet (81 mg total) by mouth daily. 90 tablet 3   atorvastatin  (LIPITOR) 80 MG tablet Take 1 tablet (80 mg total) by mouth daily. 90 tablet 3   clopidogrel  (PLAVIX ) 75 MG tablet Take 1 tablet  (75 mg total) by mouth daily. Okay to restart this medication on 02/28/2024.     cyclobenzaprine (FLEXERIL) 10 MG tablet Take 10 mg by mouth every 8 (eight) hours as needed for muscle spasms.     dexamethasone  (DECADRON ) 4 MG tablet Take 2 tablets (8 mg total) by mouth daily. Start the day after chemotherapy for 2 days. Take with food. 30 tablet 1   furosemide  (LASIX ) 20 MG tablet Take 1 tablet (20 mg total) by mouth daily. 90 tablet 3   lidocaine -prilocaine  (EMLA ) cream Apply to affected area once 30 g 3   linaclotide  (LINZESS ) 145 MCG CAPS capsule Take 1 capsule (145 mcg total) by mouth daily before breakfast. 30 capsule 3   magic mouthwash (nystatin , lidocaine , diphenhydrAMINE, alum & mag hydroxide) suspension Take 5 mLs by mouth every 4 (four) hours as needed for mouth pain. 500 mL 6   methadone  (DOLOPHINE ) 10 MG tablet Take 1 tablet (10 mg total) by mouth in the morning, at noon, in the evening, and at bedtime.     methylphenidate (RITALIN) 20 MG tablet Take 20 mg by mouth 3 (three) times daily.     metoprolol  succinate (TOPROL -XL) 25 MG 24 hr tablet Take 1 tablet (25 mg total) by mouth at bedtime.     nitroGLYCERIN  (NITROSTAT ) 0.4 MG SL tablet Place 1 tablet (0.4 mg total) under the tongue every 5 (five) minutes x 3 doses as needed for chest pain. 25 tablet 3   ondansetron  (ZOFRAN ) 8 MG tablet Take 1 tablet (8 mg total) by mouth every 8 (eight) hours as needed for nausea or vomiting. Start on the third day after chemotherapy. 30 tablet 1   polyethylene glycol (MIRALAX) 17 g packet Take 17 g by mouth daily.     prochlorperazine  (COMPAZINE ) 10 MG tablet Take 1 tablet (10 mg total) by mouth every 6 (six) hours as needed for nausea or vomiting. 30 tablet 1   sacubitril-valsartan (ENTRESTO ) 24-26 MG Take 1 tablet by mouth 2 (two) times daily. 60 tablet 11   spironolactone  (ALDACTONE ) 25 MG tablet Take 0.5 tablets (12.5 mg total) by mouth daily. 45 tablet 3   SUMAtriptan  (IMITREX ) 100 MG tablet Take  100 mg by mouth every 2 (two) hours as needed for migraine.     tamsulosin (FLOMAX) 0.4 MG CAPS capsule Take 0.4-0.8 mg by mouth See admin instructions. Take 0.4mg  (1 capsule) by mouth daily for 2 weeks, then increase to 0.8mg  (2 capsules) daily,     traZODone  (DESYREL ) 150 MG tablet Take 0.5 tablets (75 mg total) by mouth at bedtime as needed for sleep.     VITAMIN D -VITAMIN K PO Take 1 capsule by mouth daily. 10,000IU/ 100 mcg     Wheat Dextrin (BENEFIBER PO) Take 1 tablet by mouth 3 (three) times daily as needed (Constipation per wating report).     No current facility-administered medications for this visit.   Facility-Administered Medications Ordered in Other Visits  Medication Dose Route Frequency Provider Last Rate Last Admin   0.9 %  sodium chloride  infusion   Intravenous Continuous Qusay Villada, MD  10 mL/hr at 05/21/24 1042 New Bag at 05/21/24 1042   bevacizumab -adcd (VEGZELMA ) 500 mg in sodium chloride  0.9 % 100 mL chemo infusion  5 mg/kg (Treatment Plan Recorded) Intravenous Once Malosi Hemstreet, MD       clindamycin  (CLEOCIN ) 900 mg in dextrose  5 % 50 mL IVPB  900 mg Intravenous 60 min Pre-Op Debby Hila, MD       And   gentamicin  (GARAMYCIN ) 5 mg/kg in dextrose  5 % 50 mL IVPB  5 mg/kg Intravenous 60 min Pre-Op Debby Hila, MD       dextrose  5 % solution   Intravenous Continuous Sanjana Folz, MD       fluorouracil  (ADRUCIL ) 5,000 mg in sodium chloride  0.9 % 150 mL chemo infusion  2,400 mg/m2 (Treatment Plan Recorded) Intravenous 1 day or 1 dose Catalyna Reilly, MD       fluorouracil  (ADRUCIL ) chemo injection 900 mg  400 mg/m2 (Treatment Plan Recorded) Intravenous Once Ayiana Winslett, MD       leucovorin  880 mg in dextrose  5 % 250 mL infusion  400 mg/m2 (Treatment Plan Recorded) Intravenous Once Mionna Advincula, MD       oxaliplatin  (ELOXATIN ) 200 mg in dextrose  5 % 500 mL chemo infusion  85 mg/m2 (Treatment Plan Recorded) Intravenous Once Lux Meaders, MD         VITALS:    Blood pressure 108/78, pulse (!) 57, temperature (!) 97.2 F (36.2 C), temperature source Temporal, resp. rate 16, weight 211 lb 8 oz (95.9 kg), SpO2 99%.  Wt Readings from Last 3 Encounters:  05/21/24 211 lb 8 oz (95.9 kg)  05/07/24 214 lb 6.4 oz (97.3 kg)  04/23/24 219 lb 3.2 oz (99.4 kg)    Body mass index is 31.23 kg/m.    Onc Performance Status - 05/21/24 0924       ECOG Perf Status   ECOG Perf Status Ambulatory and capable of all selfcare but unable to carry out any work activities.  Up and about more than 50% of waking hours      KPS SCALE   KPS % SCORE Requires occasional assistance but is able to care for most needs            PHYSICAL EXAM:   Physical Exam Constitutional:      General: He is not in acute distress.    Appearance: Normal appearance.  HENT:     Head: Normocephalic and atraumatic.  Eyes:     Conjunctiva/sclera: Conjunctivae normal.  Cardiovascular:     Rate and Rhythm: Normal rate and regular rhythm.  Pulmonary:     Effort: Pulmonary effort is normal. No respiratory distress.  Abdominal:     General: There is no distension.  Neurological:     General: No focal deficit present.     Mental Status: He is alert and oriented to person, place, and time.  Psychiatric:        Mood and Affect: Mood normal.        Behavior: Behavior normal.      LABORATORY DATA:   I have reviewed the data as listed.  Results for orders placed or performed in visit on 05/21/24  CMP (Cancer Center only)  Result Value Ref Range   Sodium 135 135 - 145 mmol/L   Potassium 3.7 3.5 - 5.1 mmol/L   Chloride 100 98 - 111 mmol/L   CO2 23 22 - 32 mmol/L   Glucose, Bld 125 (H) 70 - 99 mg/dL   BUN 22  8 - 23 mg/dL   Creatinine 8.75 9.38 - 1.24 mg/dL   Calcium  9.1 8.9 - 10.3 mg/dL   Total Protein 6.7 6.5 - 8.1 g/dL   Albumin 4.1 3.5 - 5.0 g/dL   AST 25 15 - 41 U/L   ALT 42 0 - 44 U/L   Alkaline Phosphatase 79 38 - 126 U/L   Total Bilirubin 0.5 0.0 - 1.2 mg/dL    GFR, Estimated >39 >39 mL/min   Anion gap 12 5 - 15  CBC with Differential (Cancer Center Only)  Result Value Ref Range   WBC Count 4.5 4.0 - 10.5 K/uL   RBC 4.88 4.22 - 5.81 MIL/uL   Hemoglobin 14.2 13.0 - 17.0 g/dL   HCT 59.5 60.9 - 47.9 %   MCV 82.8 80.0 - 100.0 fL   MCH 29.1 26.0 - 34.0 pg   MCHC 35.1 30.0 - 36.0 g/dL   RDW 83.2 (H) 88.4 - 84.4 %   Platelet Count 192 150 - 400 K/uL   nRBC 0.0 0.0 - 0.2 %   Neutrophils Relative % 63 %   Neutro Abs 2.8 1.7 - 7.7 K/uL   Lymphocytes Relative 23 %   Lymphs Abs 1.0 0.7 - 4.0 K/uL   Monocytes Relative 14 %   Monocytes Absolute 0.6 0.1 - 1.0 K/uL   Eosinophils Relative 0 %   Eosinophils Absolute 0.0 0.0 - 0.5 K/uL   Basophils Relative 0 %   Basophils Absolute 0.0 0.0 - 0.1 K/uL   Immature Granulocytes 0 %   Abs Immature Granulocytes 0.01 0.00 - 0.07 K/uL  Total Protein, Urine dipstick  Result Value Ref Range   Protein, ur NEGATIVE NEGATIVE mg/dL     RADIOGRAPHIC STUDIES:  No recent pertinent imaging available to review.  CODE STATUS:  Code Status History     Date Active Date Inactive Code Status Order ID Comments User Context   11/27/2023 2206 11/29/2023 1747 Full Code [id number]  Cesario Chamorro, MD Inpatient    Questions for Most Recent Historical Code Status (Order [id number])     Question Answer   By: Consent: discussion documented in EHR            Orders Placed This Encounter  Procedures   CT CHEST ABDOMEN PELVIS W CONTRAST    Standing Status:   Future    Expected Date:   06/14/2024    Expiration Date:   05/21/2025    If indicated for the ordered procedure, I authorize the administration of contrast media per Radiology protocol:   Yes    Does the patient have a contrast media/X-ray dye allergy?:   No    Preferred imaging location?:   [facility]    If indicated for the ordered procedure, I authorize the administration of oral contrast media per Radiology protocol:   Yes     Future Appointments   Date Time Provider Department Center  05/23/2024  2:00 PM [facility] INFUSION [facility] None  05/28/2024  1:10 PM Chandra Toribio POUR, MD [facility]  06/04/2024  8:45 AM [facility] INFUSION [facility] None  06/04/2024  8:45 AM [facility] PHLEBOTOMIST [facility] None  06/04/2024  9:15 AM Parvin Stetzer, MD [facility] None  06/04/2024  9:45 AM [facility] INFUSION [facility] None  06/06/2024  2:00 PM [facility] INFUS[facility] None  06/18/2024  8:45 AM [facility] INFUSION [facility] None  06/18/2024  8:45 AM [facility] PHLEBOTOMIST [facility] None  06/18/2024  9:15 AM Zanden Colver, Chinita, MD [facility] None  06/18/2024  9:45 AM [facility]  INFUSION [facility] None  06/20/2024  2:00 PM [facility] INF[facility] None      This document was completed utilizing speech recognition software. Grammatical errors, random word insertions, pronoun errors, and incomplete sentences are an occasional consequence of this system due to software limitations, ambient noise, and hardware issues. Any formal questions or concerns about the content, text or information contained within the body of this dictation should be directly addressed to the provider for clarification.

## 2024-05-21 NOTE — Patient Instructions (Signed)

## 2024-05-21 NOTE — Patient Instructions (Signed)
 [facility] A DEPT OF [facility]  Discharge Instructions: Thank you for choosing [facility] to provide your oncology and hematology care.   If you have a lab appointment with the Cancer Center, please go directly to the Cancer Center and check in at the registration area.   Wear comfortable clothing and clothing appropriate for easy access to any Portacath or PICC line.   We strive to give you quality time with your provider. You may need to reschedule your appointment if you arrive late (15 or more minutes).  Arriving late affects you and other patients whose appointments are after yours.  Also, if you miss three or more appointments without notifying the office, you may be dismissed from the clinic at the provider's discretion.      For prescription refill requests, have your pharmacy contact our office and allow 72 hours for refills to be completed.    Today you received the following chemotherapy and/or immunotherapy agents: bevacizumab, oxaliplatin, leucovorin, fluorouracil      To help prevent nausea and vomiting after your treatment, we encourage you to take your nausea medication as directed.  BELOW ARE SYMPTOMS THAT SHOULD BE REPORTED IMMEDIATELY: *FEVER GREATER THAN 100.4 F (38 C) OR HIGHER *CHILLS OR SWEATING *NAUSEA AND VOMITING THAT IS NOT CONTROLLED WITH YOUR NAUSEA MEDICATION *UNUSUAL SHORTNESS OF BREATH *UNUSUAL BRUISING OR BLEEDING *URINARY PROBLEMS (pain or burning when urinating, or frequent urination) *BOWEL PROBLEMS (unusual diarrhea, constipation, pain near the anus) TENDERNESS IN MOUTH AND THROAT WITH OR WITHOUT PRESENCE OF ULCERS (sore throat, sores in mouth, or a toothache) UNUSUAL RASH, SWELLING OR PAIN  UNUSUAL VAGINAL DISCHARGE OR ITCHING   Items with * indicate a potential emergency and should be followed up as soon as possible or go to the Emergency Department if any problems should occur.  Please show the  CHEMOTHERAPY ALERT CARD or IMMUNOTHERAPY ALERT CARD at check-in to the Emergency Department and triage nurse.  Should you have questions after your visit or need to cancel or reschedule your appointment, please contact [facility] - A DEPT OF [facility]  Dept: [phone]  and follow the prompts.  Office hours are 8:00 a.m. to 4:30 p.m. Monday - Friday. Please note that voicemails left after 4:00 p.m. may not be returned until the following business day.  We are closed weekends and major holidays. You have access to a nurse at all times for urgent questions. Please call the main number to the clinic Dept: [phone] and follow the prompts.   For any non-urgent questions, you may also contact your provider using MyChart. We now offer e-Visits for anyone 38 and older to request care online for non-urgent symptoms. For details visit [email or web address].   Also download the MyChart app! Go to the app store, search "MyChart", open the app, select [facility], and log in with your MyChart username and password.

## 2024-05-21 NOTE — Progress Notes (Signed)
 Patient seen by Dr. Archie Patten Pasam today  Vitals are within treatment parameters:Yes   Labs are within treatment parameters: Yes   Treatment plan has been signed: Yes   Per physician team, Patient is ready for treatment and there are NO modifications to the treatment plan.

## 2024-05-23 ENCOUNTER — Inpatient Hospital Stay

## 2024-05-23 VITALS — BP 117/69 | HR 75 | Temp 98.1°F | Resp 18

## 2024-05-23 DIAGNOSIS — C20 Malignant neoplasm of rectum: Secondary | ICD-10-CM

## 2024-05-23 NOTE — Patient Instructions (Signed)

## 2024-05-27 ENCOUNTER — Inpatient Hospital Stay: Admitting: Nutrition

## 2024-05-27 NOTE — Progress Notes (Signed)
 Nutrition follow up completed with patient over the telephone.  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 He was lost to follow up. He now has recurrence and is undergoing FOLFOX and Avastin .  Weight: 211 pounds 8 oz September 16. 219 pounds 3.2 oz August 19. 220 pounds 4.8 oz May 27.  Labs include Glucose 125.  Patient suffers from chronic constipation and is now on Linzess , Miralax and Benefiber. He has not been drinking a lot of fluids. Reports he developed severe mouth sores/mucositis and it hurt to eat and drink. He is using a salt water gargle and has MMW on hand. He has been drinking some ONS and has both Ensure and Boost products available to him. Noted patient had 16.5 inch tapeworm in stool and is being followed by PCP for treatment.  Nutrition diagnosis: Unintended weight loss related to cancer and associated treatments and likely tapeworm infection as evidenced by 8 pound weight loss in ~1 month. (4%)  Intervention: Educated to consume small frequent meals and snacks. Encouraged soft foods and ONS, especially with mucositis after treatment. Reviewed importance of increased water to improve constipation. Patient educated on importance of avoiding under cooked meat.  Monitoring, Evaluation, Goals: Tolerate adequate calories and protein to minimize weight loss.  Follow as needed.

## 2024-05-28 ENCOUNTER — Ambulatory Visit (INDEPENDENT_AMBULATORY_CARE_PROVIDER_SITE_OTHER): Admitting: Family Medicine

## 2024-05-28 ENCOUNTER — Telehealth: Payer: Self-pay

## 2024-05-28 ENCOUNTER — Encounter: Payer: Self-pay | Admitting: Family Medicine

## 2024-05-28 VITALS — BP 145/98 | HR 124 | Ht 69.0 in | Wt 207.1 lb

## 2024-05-28 DIAGNOSIS — I259 Chronic ischemic heart disease, unspecified: Secondary | ICD-10-CM

## 2024-05-28 DIAGNOSIS — Z7689 Persons encountering health services in other specified circumstances: Secondary | ICD-10-CM

## 2024-05-28 DIAGNOSIS — I255 Ischemic cardiomyopathy: Secondary | ICD-10-CM | POA: Diagnosis not present

## 2024-05-28 DIAGNOSIS — E349 Endocrine disorder, unspecified: Secondary | ICD-10-CM

## 2024-05-28 DIAGNOSIS — I5020 Unspecified systolic (congestive) heart failure: Secondary | ICD-10-CM | POA: Insufficient documentation

## 2024-05-28 DIAGNOSIS — Z131 Encounter for screening for diabetes mellitus: Secondary | ICD-10-CM

## 2024-05-28 DIAGNOSIS — K1230 Oral mucositis (ulcerative), unspecified: Secondary | ICD-10-CM | POA: Diagnosis not present

## 2024-05-28 DIAGNOSIS — K5903 Drug induced constipation: Secondary | ICD-10-CM | POA: Insufficient documentation

## 2024-05-28 DIAGNOSIS — E298 Other testicular dysfunction: Secondary | ICD-10-CM

## 2024-05-28 DIAGNOSIS — C20 Malignant neoplasm of rectum: Secondary | ICD-10-CM | POA: Diagnosis not present

## 2024-05-28 DIAGNOSIS — G43109 Migraine with aura, not intractable, without status migrainosus: Secondary | ICD-10-CM

## 2024-05-28 DIAGNOSIS — Z6830 Body mass index (BMI) 30.0-30.9, adult: Secondary | ICD-10-CM

## 2024-05-28 DIAGNOSIS — I2102 ST elevation (STEMI) myocardial infarction involving left anterior descending coronary artery: Secondary | ICD-10-CM

## 2024-05-28 DIAGNOSIS — K1231 Oral mucositis (ulcerative) due to antineoplastic therapy: Secondary | ICD-10-CM | POA: Insufficient documentation

## 2024-05-28 DIAGNOSIS — E782 Mixed hyperlipidemia: Secondary | ICD-10-CM

## 2024-05-28 DIAGNOSIS — G894 Chronic pain syndrome: Secondary | ICD-10-CM

## 2024-05-28 MED ORDER — SACUBITRIL-VALSARTAN 24-26 MG PO TABS: 1.0000 | ORAL_TABLET | Freq: Two times a day (BID) | ORAL | 3 refills | Status: AC

## 2024-05-28 MED ORDER — CLOPIDOGREL BISULFATE 75 MG PO TABS: 75.0000 mg | ORAL_TABLET | Freq: Every day | ORAL | 1 refills | Status: AC

## 2024-05-28 MED ORDER — ATORVASTATIN CALCIUM 80 MG PO TABS: 80.0000 mg | ORAL_TABLET | Freq: Every day | ORAL | 3 refills | Status: AC

## 2024-05-28 MED ORDER — METHYLPHENIDATE HCL 20 MG PO TABS: 20.0000 mg | ORAL_TABLET | Freq: Three times a day (TID) | ORAL | 0 refills | Status: DC

## 2024-05-28 NOTE — Assessment & Plan Note (Signed)
 Reports significant alternating bowel habits. - Advised to discontinue docusate and use linaclotide  alone for constipation management and titrate as needed.

## 2024-05-28 NOTE — Progress Notes (Unsigned)
 New Patient Office Visit  Subjective   Patient ID: Derrick Johnson, male    DOB: 1956-02-01  Age: 68 y.o. MRN: [id number]  CC:  Chief Complaint  Patient presents with   New Patient (Initial Visit)    HPI CURREN MOHRMANN presents to establish care  Subjective - New patient establishing care, transferring from Dr. Loring. Complex medical history including metastatic rectal cancer and coronary artery disease.  - Rectal Cancer, metastatic to lungs: Diagnosed with rectal cancer, treated in 2021. Recently found to have metastases to both lungs. Now undergoing chemotherapy, upcoming 6th session. Was not informed initially of the risk of metastasis. Thoracic surgeon consulted, but resection of lung nodules not felt to be an option at this time. Plan to re-evaluate after the 6th cycle of chemotherapy.  - Coronary Artery Disease: History of LAD 100% blockage, status post 2 stents in March of this year. Reports no significant improvement in symptoms post-procedure. No chest pain. Uses nitroglycerin  PRN, but has not needed it.  - Chronic Pain: Managed with methadone . Also has a spinal cord stimulator implanted, but it is no longer effective. Reports it is still in place.  - Chronic Migraines: History of chronic migraines, occurs 3-4 days a week. Uses sumatriptan  frequently, up to 12 tablets per month.  - Oral Ulcers: Developed mouth sores and swollen tongue secondary to chemotherapy. Finding it difficult to wear dentures. Using magic mouthwash and salt/baking soda rinses with some relief. Still has several ulcers in mouth and on tongue.  - GI issues: Reports alternating between severe constipation and chronic diarrhea. Trying to regulate bowel movements. Passed a suspected tapeworm, 16.5 inches long, approximately 2 weeks ago. Specimen sent to lab, results pending.  - Shortness of Breath: Reports some difficulty breathing. Uses albuterol  inhaler daily and has a nebulizer.  Medications: Methadone  up  to 4 tabs q12h (pain), flurbiprofen (pain, not currently taking due to constipation), trazodone  0.5-0.75 tab at night PRN (sleep), dexamethasone  (chemo-related nausea), ondansetron  (nausea), prochlorperazine  (nausea), Entresto  (heart failure), docusate (constipation), senna (constipation, rarely), atorvastatin  80mg  (cholesterol), spironolactone  0.5 tab (heart failure, blood pressure), clopidogrel  (stents), famotidine (acid reflux, PRN), vitamin B6 daily, furosemide  daily, methylphenidate  TID, metoprolol  (heart failure), linaclotide  (constipation), aspirin  81mg , sumatriptan  (migraines, PRN), vitamin D /K2 daily, tamsulosin, cyclobenzaprine PRN, hydroxyzine PRN, omeprazole PRN, magic mouthwash PRN, albuterol  inhaler daily, nitroglycerin  PRN, testosterone  injection 0.75 mL every 2 weeks.  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 PSH: LAD stenting x2 (11/2023), spinal cord stimulator placement, left rotator cuff repair x4, cholecystectomy, vasectomy, TMJ arthroscopy. FH: Sister with asthma and unspecified cancer. Father with heart disease (MI in mid-30s). Mother with high cholesterol. Social Hx: Age 9. Married, no children. Retired/on disability since 08/2003. Former Curator at [employer]. Denies tobacco or recreational drug use. Reports occasional alcohol use in the past, but not currently.  ROS: Constitutional: Denies fever, chills. Reports fatigue. HEENT: Reports mouth sores, swollen tongue. Denies vision changes. Reports year-round allergies. CV: Denies chest pain. Respiratory: Reports some dyspnea. Uses inhaler daily. GI: Reports alternating constipation and diarrhea, abdominal tenderness. Nausea managed with medications. GU: No issues reported. MSK: History of shoulder  issues. Neuro: History of chronic migraines.  Objective General: Alert and oriented, in no acute distress. HEENT: EOMI. Oropharynx shows several ulcers, tongue slightly swollen. Dentures not in place. CV: RRR, no murmurs,  rubs, or gallops. PULM: Lungs clear to auscultation bilaterally. GI: Abdomen soft. Tenderness to palpation in the lower abdomen. Bowel sounds present.   Outpatient Encounter Medications as of 05/28/2024  Medication Sig   albuterol  (PROVENTIL ) (2.5 MG/3ML) 0.083% nebulizer solution Take 2.5 mg by nebulization every 6 (six) hours as needed for wheezing or shortness of breath.    albuterol  (VENTOLIN  HFA) 108 (90 Base) MCG/ACT inhaler Inhale 1-2 puffs into the lungs every 6 (six) hours as needed for wheezing or shortness of breath.   aspirin  81 MG chewable tablet Chew 1 tablet (81 mg total) by mouth daily.   atorvastatin  (LIPITOR) 80 MG tablet Take 1 tablet (80 mg total) by mouth daily.   clopidogrel  (PLAVIX ) 75 MG tablet Take 1 tablet (75 mg total) by mouth daily.   cyclobenzaprine (FLEXERIL) 10 MG tablet Take 10 mg by mouth every 8 (eight) hours as needed for muscle spasms.   dexamethasone  (DECADRON ) 4 MG tablet Take 2 tablets (8 mg total) by mouth daily. Start the day after chemotherapy for 2 days. Take with food.   furosemide  (LASIX ) 20 MG tablet Take 1 tablet (20 mg total) by mouth daily.   lidocaine -prilocaine  (EMLA ) cream Apply to affected area once   linaclotide  (LINZESS ) 145 MCG CAPS capsule Take 1 capsule (145 mcg total) by mouth daily before breakfast.   magic mouthwash (nystatin , lidocaine , diphenhydrAMINE, alum & mag hydroxide) suspension Take 5 mLs by mouth every 4 (four) hours as needed for mouth pain.   methadone  (DOLOPHINE ) 10 MG tablet Take 1 tablet (10 mg total) by mouth in the morning, at noon, in the evening, and at bedtime.   [START ON 06/07/2024] methylphenidate  (RITALIN ) 20 MG tablet Take 1 tablet (20 mg total) by mouth 3 (three) times daily.   metoprolol   succinate (TOPROL -XL) 25 MG 24 hr tablet Take 1 tablet (25 mg total) by mouth at bedtime.   nitroGLYCERIN  (NITROSTAT ) 0.4 MG SL tablet Place 1 tablet (0.4 mg total) under the tongue every 5 (five) minutes x 3 doses as needed for chest pain.   ondansetron  (ZOFRAN ) 8 MG tablet Take 1 tablet (8 mg total) by mouth every 8 (eight) hours as needed for nausea or vomiting. Start on the third day after chemotherapy.   polyethylene glycol (MIRALAX) 17 g packet Take 17 g by mouth daily.   prochlorperazine  (COMPAZINE ) 10 MG tablet Take 1 tablet (10 mg total) by mouth every 6 (six) hours as needed for nausea or vomiting.   sacubitril -valsartan  (ENTRESTO ) 24-26 MG Take 1 tablet by mouth 2 (two) times daily.   spironolactone  (ALDACTONE ) 25 MG tablet Take 0.5 tablets (12.5 mg total) by mouth daily.   SUMAtriptan  (IMITREX ) 100 MG tablet Take 100 mg by mouth every 2 (two) hours as needed for migraine.   tamsulosin (FLOMAX) 0.4 MG CAPS capsule Take 0.4-0.8 mg by mouth See admin instructions. Take 0.4mg  (1 capsule) by mouth daily for 2 weeks, then increase to 0.8mg  (2 capsules) daily,   traZODone  (DESYREL ) 150 MG tablet Take 0.5 tablets (75 mg total) by mouth at bedtime as needed for sleep.   VITAMIN D -VITAMIN K PO Take 1 capsule by mouth daily. 10,000IU/ 100 mcg   Wheat Dextrin (BENEFIBER PO) Take 1 tablet by mouth 3 (three) times daily as needed (Constipation per wating report).   [DISCONTINUED] atorvastatin  (LIPITOR) 80 MG tablet Take 1 tablet (80 mg total) by mouth daily.   [DISCONTINUED] clopidogrel  (PLAVIX ) 75 MG tablet Take 1 tablet (75 mg total) by mouth daily. Okay  to restart this medication on 02/28/2024.   [DISCONTINUED] methylphenidate  (RITALIN ) 20 MG tablet Take 20 mg by mouth 3 (three) times daily.   [DISCONTINUED] sacubitril -valsartan  (ENTRESTO ) 24-26 MG Take 1 tablet by mouth 2 (two) times daily.   Facility-Administered Encounter Medications as of 05/28/2024  Medication   clindamycin  (CLEOCIN ) 900 mg in  dextrose  5 % 50 mL IVPB   And   gentamicin  (GARAMYCIN ) 5 mg/kg in dextrose  5 % 50 mL IVPB    Past Medical History:  Diagnosis Date   Allergy lifelong   Anxiety    Asthma lifelong   Back contusion    CAD (coronary artery disease) 11/27/2023   s/p PCI   Chronic migraine    Chronic pain    Dissection of cerebral artery    Electrocution and nonfatal effects of electric current    Fibromyalgia    GERD (gastroesophageal reflux disease)    in past, no current problem per pt on 02/26/24   Ischemic cardiomyopathy    Pneumonia    Rectal cancer (HCC)    STEMI (ST elevation myocardial infarction) (HCC) 11/27/2023   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   Bilateral Cerebral Dissection  of Vertebral Artery    Past Surgical History:  Procedure Laterality Date   CHOLECYSTECTOMY  06/1992   dr cheryl williams   COLONOSCOPY     CORONARY/GRAFT ACUTE MI REVASCULARIZATION N/A 11/27/2023   Procedure: Coronary/Graft Acute MI Revascularization;  Surgeon: Thukkani, Arun K, MD;  Location: [facility]IVE [facility];  Service: Cardiovascular;  Laterality: N/A;   COSMETIC SURGERY     CRYO INTERCOSTAL NERVE BLOCK     IR IMAGING GUIDED PORT INSERTION  03/20/2024   LEFT HEART CATH AND CORONARY ANGIOGRAPHY N/A 11/27/2023   Procedure: LEFT HEART CATH AND CORONARY ANGIOGRAPHY;  Surgeon: Wendel Lurena POUR, MD;  Location: [facility] CV [facility];  Service: Cardiovascular;  Laterality: N/A;   maxofacial surgery     ROTATOR CUFF REPAIR Left    SPINAL CORD STIMULATOR IMPLANT     does not work per patient in years   SPINE SURGERY     numerous scs trials + implants- [facility] 2004,05   TMJ ARTHROSCOPY     VASECTOMY  1992 ?   VIDEO BRONCHOSCOPY WITH ENDOBRONCHIAL NAVIGATION Bilateral 02/27/2024   Procedure: VIDEO BRONCHOSCOPY WITH ENDOBRONCHIAL NAVIGATION;  Surgeon: Shelah Lamar RAMAN, MD;  Location: [facility] ENDOSCOPY;  Service: Pulmonary;  Laterality: Bilateral;    Family History  Problem Relation Age of Onset   Hyperlipidemia Mother     Heart disease Father    Asthma Sister    Cancer Sister    Thyroid  disease Neg Hx     Social History   Socioeconomic History   Marital status: Married    Spouse name: [name]   Number of children: 0   Years of education: Not on file   Highest education level: 12th grade  Occupational History   Occupation: Disability  Tobacco Use   Smoking status: Never    Passive exposure: Never   Smokeless tobacco: Never  Vaping Use   Vaping status: Never Used  Substance and Sexual Activity   Alcohol use: Not Currently   Drug use: No   Sexual activity: Yes  Other Topics Concern   Not on file  Social History Narrative   Not on file   Social Drivers of Health   Financial Resource Strain: Patient Declined (05/24/2024)   Overall Financial Resource Strain (CARDIA)    Difficulty of Paying Living Expenses: Patient declined  Food Insecurity: No Food Insecurity (05/24/2024)   Hunger Vital Sign    Worried About Running Out of Food in the Last Year: Never true    Ran Out of Food in the Last Year: Never true  Transportation Needs: No Transportation Needs (05/24/2024)   PRAPARE - Administrator, Civil Service (Medical): No    Lack of Transportation (Non-Medical): No  Physical Activity: Insufficiently Active (05/24/2024)   Exercise Vital Sign    Days of Exercise per Week: 1 day    Minutes of Exercise per Session: 10 min  Stress: Stress Concern Present (05/24/2024)   [employer] of Occupational Health - Occupational Stress Questionnaire    Feeling of Stress: Rather much  Social Connections: Moderately Isolated (05/24/2024)   Social Connection and Isolation Panel    Frequency of Communication with Friends and Family: Twice a week    Frequency of Social Gatherings with Friends and Family: Once a week    Attends Religious Services: Patient declined    Database administrator or Organizations: No    Attends Engineer, structural: Not on file    Marital Status: Married   Catering manager Violence: Not At Risk (01/30/2024)   Humiliation, Afraid, Rape, and Kick questionnaire    Fear of Current or Ex-Partner: No    Emotionally Abused: No    Physically Abused: No    Sexually Abused: No    ROS     Objective   BP (!) 145/98   Pulse (!) 124   Ht 5' 9 (1.753 m)   Wt 207 lb 1.9 oz (93.9 kg)   SpO2 94%   BMI 30.59 kg/m   Physical Exam Gen: alert, oriented Heent: perrla, eomi.  False teeth.  Ulcerations of the oropharynx.  Cv: rrr, no murmur Pulm: lctab. No wheezes.  Gi; soft, nbs.  Mildly tender in LLQ.  Msk: mildly antalgic gait.   Ext: no pedal edema.  Skin: warm and dry.  Psych: pleasant affect     Assessment & Plan:   Encounter to establish care  Ischemic cardiomyopathy -     Sacubitril -Valsartan ; Take 1 tablet by mouth 2 (two) times daily.  Dispense: 180 tablet; Refill: 3  Adenocarcinoma of rectum [facility]) Assessment & Plan: History of rectal cancer diagnosed 2021, now with lung metastases. Currently undergoing chemotherapy with 6th session upcoming. Decision for surgical resection pending re-evaluation post-chemo. - Continue current chemotherapy regimen as directed by oncology. - Will continue to coordinate care with oncology team.   Acute ST elevation myocardial infarction (STEMI) due to occlusion of mid portion of left anterior descending (LAD) coronary artery [facility]) Assessment & Plan: History of 100% LAD occlusion s/p 2 stents in 11/2023. Also with diagnosis of heart failure. Stable on current cardiac medications. - Refills for Entresto , clopidogrel , atorvastatin , and metoprolol  sent to [facility]. - Continue current medications as prescribed by cardiology.   Chronic pain syndrome Assessment & Plan: Managed with methadone  and prn flexeril. Has a non-functional spinal cord stimulator in situ. - Continue current pain management regimen. - continue methadone  w/ flexeril prn   Drug induced constipation Assessment &  Plan: Reports significant alternating bowel habits. Also reports passing a 16.5-inch specimen suspected to be a tapeworm ~2 weeks ago; lab results are pending. - Advised to discontinue docusate and use linaclotide  alone for constipation management and titrate as needed. - Will follow up on pending lab results for the specimen.   Migraine with aura and without status migrainosus, not intractable Assessment &  Plan: Continue triptan prn   Testosterone  insufficiency Assessment & Plan: Receiving testosterone  0.75 mL (200mg /ml)  injections every 2 weeks. Last dose was ~2 weeks ago. - Plan to check testosterone  level with next lab draw before next visit.   Mucositis Assessment & Plan: Experiencing oral ulcers and tongue swelling. Using magic mouthwash and home remedies. - Continue current oral care regimen. - Oncology is aware and managing.   HFrEF (heart failure with reduced ejection fraction) (HCC) Assessment & Plan: Most recent echo improved.  Continue entresto , spiro, metoprolol , and lasix .  Avoiding sglt2 d/t hx of uti's.  Mgmt per cardiology/heart failure clinic.    Other orders -     Atorvastatin  Calcium ; Take 1 tablet (80 mg total) by mouth daily.  Dispense: 90 tablet; Refill: 3 -     Clopidogrel  Bisulfate; Take 1 tablet (75 mg total) by mouth daily.  Dispense: 90 tablet; Refill: 1 -     Methylphenidate  HCl; Take 1 tablet (20 mg total) by mouth 3 (three) times daily.  Dispense: 180 tablet; Refill: 0    Return in about 2 months (around 07/28/2024) for chronic issues.   Toribio MARLA Slain, MD

## 2024-05-28 NOTE — Assessment & Plan Note (Signed)
 Most recent echo improved.  Continue entresto , spiro, metoprolol , and lasix .  Avoiding sglt2 d/t hx of uti's.  Mgmt per cardiology/heart failure clinic.

## 2024-05-28 NOTE — Assessment & Plan Note (Signed)
 Receiving testosterone  0.75 mL (200mg /ml)  injections every 2 weeks. Last dose was ~2 weeks ago. - Plan to check testosterone  level with next lab draw before next visit.

## 2024-05-28 NOTE — Patient Instructions (Addendum)
 It was nice to see you today,  We addressed the following topics today: -I will refill the Entresto  clopidogrel  atorvastatin  and methylphenidate . - I would rather you to have your oncologist filled the dexamethasone  or any of the other medications I prescribed for now. - I would like to see you back in 2 months and get labs a week before that. - For constipation, try holding off on the docusate/Colace and just using the Linzess  for now.  Have a great day,  Rolan Slain, MD

## 2024-05-28 NOTE — Assessment & Plan Note (Signed)
 History of 100% LAD occlusion s/p 2 stents in 11/2023. Also with diagnosis of heart failure. Stable on current cardiac medications. - Refills for Entresto , clopidogrel , atorvastatin , and metoprolol  sent to [facility]. - Continue current medications as prescribed by cardiology.

## 2024-05-28 NOTE — Assessment & Plan Note (Signed)
 Continue triptan prn

## 2024-05-28 NOTE — Assessment & Plan Note (Addendum)
 Managed with methadone  and prn flexeril. Has a non-functional spinal cord stimulator in situ. - Continue current pain management regimen. - continue methadone  w/ flexeril prn

## 2024-05-28 NOTE — Assessment & Plan Note (Signed)
 Experiencing oral ulcers and tongue swelling. Using magic mouthwash and home remedies. - Continue current oral care regimen. - Oncology is aware and managing.

## 2024-05-28 NOTE — Assessment & Plan Note (Signed)
 History of rectal cancer diagnosed 2021, now with lung metastases. Currently undergoing chemotherapy with 6th session upcoming. Decision for surgical resection pending re-evaluation post-chemo. - Continue current chemotherapy regimen as directed by oncology. - Will continue to coordinate care with oncology team.

## 2024-05-28 NOTE — Telephone Encounter (Signed)
 Followed up on phone message left over the weekend with complaints of uncomfortable mouth sores. Patient reported that he is using his Magic Mouth Wash more regularly and has had much improvement since her reported his symptoms. Patient was also given recipe for baking soda, salt, and warm water to use if additional remedy was needed for mouth sores. Patient agreed.

## 2024-05-29 ENCOUNTER — Other Ambulatory Visit: Payer: Self-pay

## 2024-05-29 ENCOUNTER — Ambulatory Visit: Admitting: Family Medicine

## 2024-06-03 MED ORDER — SODIUM CHLORIDE 0.9 % IV SOLN
2400.0000 mg/m2 | INTRAVENOUS | Status: DC
  Administered 2024-06-04: 5000 mg via INTRAVENOUS
  Filled 2024-06-03: qty 100

## 2024-06-03 MED ORDER — FLUOROURACIL CHEMO INJECTION 2.5 GM/50ML
400.0000 mg/m2 | Freq: Once | INTRAVENOUS | Status: AC
  Administered 2024-06-04: 900 mg via INTRAVENOUS
  Filled 2024-06-03: qty 18

## 2024-06-04 ENCOUNTER — Inpatient Hospital Stay (HOSPITAL_BASED_OUTPATIENT_CLINIC_OR_DEPARTMENT_OTHER): Admitting: Oncology

## 2024-06-04 ENCOUNTER — Inpatient Hospital Stay

## 2024-06-04 ENCOUNTER — Other Ambulatory Visit: Payer: Self-pay

## 2024-06-04 ENCOUNTER — Encounter: Payer: Self-pay | Admitting: Oncology

## 2024-06-04 VITALS — BP 128/81 | HR 60 | Temp 97.8°F | Resp 18 | Ht 69.0 in | Wt 213.2 lb

## 2024-06-04 VITALS — BP 114/65 | HR 66 | Temp 98.3°F | Resp 18

## 2024-06-04 DIAGNOSIS — K1231 Oral mucositis (ulcerative) due to antineoplastic therapy: Secondary | ICD-10-CM

## 2024-06-04 DIAGNOSIS — C20 Malignant neoplasm of rectum: Secondary | ICD-10-CM

## 2024-06-04 DIAGNOSIS — Z5111 Encounter for antineoplastic chemotherapy: Secondary | ICD-10-CM | POA: Diagnosis not present

## 2024-06-04 LAB — CBC WITH DIFFERENTIAL (CANCER CENTER ONLY)
Abs Immature Granulocytes: 0.05 K/uL (ref 0.00–0.07)
Basophils Absolute: 0 K/uL (ref 0.0–0.1)
Basophils Relative: 1 %
Eosinophils Absolute: 0 K/uL (ref 0.0–0.5)
Eosinophils Relative: 1 %
HCT: 41.7 % (ref 39.0–52.0)
Hemoglobin: 14.4 g/dL (ref 13.0–17.0)
Immature Granulocytes: 1 %
Lymphocytes Relative: 32 %
Lymphs Abs: 1.2 K/uL (ref 0.7–4.0)
MCH: 29.2 pg (ref 26.0–34.0)
MCHC: 34.5 g/dL (ref 30.0–36.0)
MCV: 84.6 fL (ref 80.0–100.0)
Monocytes Absolute: 0.7 K/uL (ref 0.1–1.0)
Monocytes Relative: 18 %
Neutro Abs: 1.8 K/uL (ref 1.7–7.7)
Neutrophils Relative %: 47 %
Platelet Count: 163 K/uL (ref 150–400)
RBC: 4.93 MIL/uL (ref 4.22–5.81)
RDW: 19.2 % — ABNORMAL HIGH (ref 11.5–15.5)
WBC Count: 3.7 K/uL — ABNORMAL LOW (ref 4.0–10.5)
nRBC: 0 % (ref 0.0–0.2)

## 2024-06-04 LAB — CMP (CANCER CENTER ONLY)
ALT: 59 U/L — ABNORMAL HIGH (ref 0–44)
AST: 38 U/L (ref 15–41)
Albumin: 3.8 g/dL (ref 3.5–5.0)
Alkaline Phosphatase: 106 U/L (ref 38–126)
Anion gap: 11 (ref 5–15)
BUN: 8 mg/dL (ref 8–23)
CO2: 23 mmol/L (ref 22–32)
Calcium: 8.9 mg/dL (ref 8.9–10.3)
Chloride: 104 mmol/L (ref 98–111)
Creatinine: 1 mg/dL (ref 0.61–1.24)
GFR, Estimated: >60 mL/min (ref >60)
Glucose, Bld: 110 mg/dL — ABNORMAL HIGH (ref 70–99)
Potassium: 3.8 mmol/L (ref 3.5–5.1)
Sodium: 138 mmol/L (ref 135–145)
Total Bilirubin: 0.5 mg/dL (ref 0.0–1.2)
Total Protein: 6.5 g/dL (ref 6.5–8.1)

## 2024-06-04 LAB — TOTAL PROTEIN, URINE DIPSTICK: Protein, ur: NEGATIVE mg/dL

## 2024-06-04 MED ORDER — LEUCOVORIN CALCIUM INJECTION 350 MG
400.0000 mg/m2 | Freq: Once | INTRAVENOUS | Status: AC
  Administered 2024-06-04: 880 mg via INTRAVENOUS
  Filled 2024-06-04: qty 44

## 2024-06-04 MED ORDER — DEXAMETHASONE SODIUM PHOSPHATE 10 MG/ML IJ SOLN
10.0000 mg | Freq: Once | INTRAMUSCULAR | Status: AC
  Administered 2024-06-04: 10 mg via INTRAVENOUS
  Filled 2024-06-04: qty 1

## 2024-06-04 MED ORDER — SODIUM CHLORIDE 0.9 % IV SOLN
5.0000 mg/kg | Freq: Once | INTRAVENOUS | Status: AC
  Administered 2024-06-04: 500 mg via INTRAVENOUS
  Filled 2024-06-04: qty 16

## 2024-06-04 MED ORDER — LEUCOVORIN CALCIUM INJECTION 350 MG
400.0000 mg/m2 | Freq: Once | INTRAVENOUS | Status: DC
  Filled 2024-06-04: qty 44

## 2024-06-04 MED ORDER — PALONOSETRON HCL INJECTION 0.25 MG/5ML
0.2500 mg | Freq: Once | INTRAVENOUS | Status: AC
  Administered 2024-06-04: 0.25 mg via INTRAVENOUS
  Filled 2024-06-04: qty 5

## 2024-06-04 MED ORDER — SODIUM CHLORIDE 0.9 % IV SOLN: INTRAVENOUS | Status: DC

## 2024-06-04 MED ORDER — OXALIPLATIN CHEMO INJECTION 100 MG/20ML
85.0000 mg/m2 | Freq: Once | INTRAVENOUS | Status: AC
  Administered 2024-06-04: 200 mg via INTRAVENOUS
  Filled 2024-06-04: qty 40

## 2024-06-04 MED ORDER — DEXTROSE 5 % IV SOLN: INTRAVENOUS | Status: DC

## 2024-06-04 NOTE — Assessment & Plan Note (Signed)
 Oral mucositis due to chemotherapy with symptoms of mouth swelling and sores. Condition has improved significantly, with only a couple of sores remaining. Using a salt water and baking soda rinse, which is as effective as the prescribed mouthwash. - Continue using salt water and baking soda rinse for oral care - Use prescribed mouthwash as needed

## 2024-06-04 NOTE — Progress Notes (Signed)
 Patient seen by Dr. Chinita Pasam today  Vitals are within treatment parameters:Yes   Labs are within treatment parameters: No (Please specify and give further instructions.)   WBC low at 3.7.  Dr. Autumn aware.  Treatment plan has been signed: Yes   Per physician team, Patient is ready for treatment and there are NO modifications to the treatment plan.  Dr. Autumn aware of WBC being low and said to proceed with scheduled treatment today.

## 2024-06-04 NOTE — Progress Notes (Signed)
 Urine protein not collected prior to pt coming back to infusion area. MD contacted regarding the need for a urine protein prior to administering bevacizumab . MD stating to collect urine protein but do not need to wait for the result to hang the bevacizumab .

## 2024-06-04 NOTE — Patient Instructions (Signed)

## 2024-06-04 NOTE — Assessment & Plan Note (Addendum)
 Please review oncology history for additional details and timeline of events.  Patient was originally diagnosed with rectal cancer in 2021 and was treated with concurrent chemoradiation with Xeloda  with eventual plans for surgery.  He was lost to follow-up after that.  Restaging CT chest abdomen pelvis on 02/08/2024 suggested multiple lung nodules, concerning for metastatic recurrence.  On 02/27/2024, Dr. Shelah performed flexible video fiberoptic bronchoscopy with robotic assistance and biopsies of right upper lobe lung nodule.  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  Immunostains were positive for CK20 and CDX2, negative for CK7 and TTF-1, consistent with a colorectal primary.  cTx,cNx,pM1a, Stage IV A disease.  MSI stable.  NGS testing on the specimen showed no actionable mutations.  All treatment options are palliative in nature, given stage IV disease.    He had an appointment on 03/15/2024 with cardiothoracic surgery Dr. Shyrl for possible resection of the lung lesions:  he likely is out of his radiation window to undergo rectal cancer resection. If this cannot be resected there is minimal advantage to resect the pulmonary nodules   Palliative systemic treatment with FOLFOX started from 03/28/2024.  Avastin  was added from cycle 2 onwards.   Tolerating chemotherapy reasonably well overall with some expected side effects including mouth sores.  Symptoms controlled with supportive therapy including mouth rinses with baking soda and salt.  We prescribed Magic mouthwash previously.  Due for cycle 6 of chemotherapy today.  Labs reveal leukopenia with white count of 3700 but ANC is 1800.  No dose-limiting toxicities.  Will proceed with FOLFOX plus Avastin  at standard dosing.  We did get approval for Neulasta for future cycles, in case of progressive leukopenia/neutropenia.  If progressive neuropathy or other side effects are noted, we will dose reduce oxaliplatin , followed by  5-FU.  Since he completed 6 cycles of chemotherapy, we will obtain restaging CT chest, abdomen and pelvis.  This is currently scheduled on 06/14/2024.

## 2024-06-04 NOTE — Patient Instructions (Signed)
 [facility] A DEPT OF [facility]  Discharge Instructions: Thank you for choosing [facility] to provide your oncology and hematology care.   If you have a lab appointment with the Cancer Center, please go directly to the Cancer Center and check in at the registration area.   Wear comfortable clothing and clothing appropriate for easy access to any Portacath or PICC line.   We strive to give you quality time with your provider. You may need to reschedule your appointment if you arrive late (15 or more minutes).  Arriving late affects you and other patients whose appointments are after yours.  Also, if you miss three or more appointments without notifying the office, you may be dismissed from the clinic at the provider's discretion.      For prescription refill requests, have your pharmacy contact our office and allow 72 hours for refills to be completed.    Today you received the following chemotherapy and/or immunotherapy agents: bevacizumab, oxaliplatin, leucovorin, fluorouracil      To help prevent nausea and vomiting after your treatment, we encourage you to take your nausea medication as directed.  BELOW ARE SYMPTOMS THAT SHOULD BE REPORTED IMMEDIATELY: *FEVER GREATER THAN 100.4 F (38 C) OR HIGHER *CHILLS OR SWEATING *NAUSEA AND VOMITING THAT IS NOT CONTROLLED WITH YOUR NAUSEA MEDICATION *UNUSUAL SHORTNESS OF BREATH *UNUSUAL BRUISING OR BLEEDING *URINARY PROBLEMS (pain or burning when urinating, or frequent urination) *BOWEL PROBLEMS (unusual diarrhea, constipation, pain near the anus) TENDERNESS IN MOUTH AND THROAT WITH OR WITHOUT PRESENCE OF ULCERS (sore throat, sores in mouth, or a toothache) UNUSUAL RASH, SWELLING OR PAIN  UNUSUAL VAGINAL DISCHARGE OR ITCHING   Items with * indicate a potential emergency and should be followed up as soon as possible or go to the Emergency Department if any problems should occur.  Please show the  CHEMOTHERAPY ALERT CARD or IMMUNOTHERAPY ALERT CARD at check-in to the Emergency Department and triage nurse.  Should you have questions after your visit or need to cancel or reschedule your appointment, please contact [facility] - A DEPT OF [facility]  Dept: [phone]  and follow the prompts.  Office hours are 8:00 a.m. to 4:30 p.m. Monday - Friday. Please note that voicemails left after 4:00 p.m. may not be returned until the following business day.  We are closed weekends and major holidays. You have access to a nurse at all times for urgent questions. Please call the main number to the clinic Dept: [phone] and follow the prompts.   For any non-urgent questions, you may also contact your provider using MyChart. We now offer e-Visits for anyone 38 and older to request care online for non-urgent symptoms. For details visit [email or web address].   Also download the MyChart app! Go to the app store, search "MyChart", open the app, select [facility], and log in with your MyChart username and password.

## 2024-06-04 NOTE — Progress Notes (Signed)
 Derrick Johnson  ONCOLOGY CLINIC PROGRESS NOTE   Patient Care Team: Chandra Toribio POUR, MD as PCP - General (Family Medicine) Thukkani, Arun K, MD as PCP - Cardiology (Cardiology) Babs Arthea DASEN, MD as Consulting Physician (Physical Medicine and Rehabilitation) Debby Fidela CROME, NP as Nurse Practitioner (Physical Medicine and Rehabilitation) Lennard Lesta FALCON, MD as Consulting Physician (Gastroenterology)  PATIENT NAME: Derrick Johnson   MR#: [id number] DOB: 06-17-1956  Date of visit: 06/04/2024   ASSESSMENT & PLAN:   Derrick Johnson is a 68 y.o. gentleman with a past medical history of Derrick Johnson diagnosed in February 2021, treated with concurrent chemoradiation using Xeloda , completed treatment in April 2021, patient did not follow-up with surgery or with oncology departments after this. 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 He was referred back to our clinic by his PCP to reestablish care for his history of Derrick Johnson.  He has recurrence of disease with biopsy-proven lung mets.  [facility]) Please review oncology history for additional details and timeline of events.  Patient was originally diagnosed with Derrick Johnson in 2021 and was treated with concurrent chemoradiation with Xeloda  with eventual plans for surgery.  He was lost to follow-up after that.  Restaging CT chest abdomen pelvis on 02/08/2024 suggested multiple lung nodules, concerning for metastatic recurrence.  On 02/27/2024, Dr. Shelah performed flexible video fiberoptic bronchoscopy with robotic assistance and biopsies of right upper lobe lung nodule.  Pathology came back positive for Derrick.  Immunostains were positive for CK20 and CDX2, negative for CK7 and TTF-1, consistent with a colorectal  primary.  cTx,cNx,pM1a, Stage IV A disease.  MSI stable.  NGS testing on the specimen showed no actionable mutations.  All treatment options are palliative in nature, given stage IV disease.    He had an appointment on 03/15/2024 with cardiothoracic surgery Dr. Shyrl for possible resection of the lung lesions:  he likely is out of his radiation window to undergo Derrick Johnson resection. If this cannot be resected there is minimal advantage to resect the pulmonary nodules   Palliative systemic treatment with FOLFOX started from 03/28/2024.  Avastin  was added from cycle 2 onwards.   Tolerating chemotherapy reasonably well overall with some expected side effects including mouth sores.  Symptoms controlled with supportive therapy including mouth rinses with baking soda and salt.  We prescribed Magic mouthwash previously.  Due for cycle 6 of chemotherapy today.  Labs reveal leukopenia with white count of 3700 but ANC is 1800.  No dose-limiting toxicities.  Will proceed with FOLFOX plus Avastin  at standard dosing.  We did get approval for Neulasta for future cycles, in case of progressive leukopenia/neutropenia.  If progressive neuropathy or other side effects are noted, we will dose reduce oxaliplatin , followed by 5-FU.  Since he completed 6 cycles of chemotherapy, we will obtain restaging CT chest, abdomen and pelvis.  This is currently scheduled on 06/14/2024.  Mucositis due to chemotherapy Oral mucositis due to chemotherapy with symptoms of mouth swelling and sores. Condition has improved significantly, with only a couple of sores remaining. Using a salt water and baking soda rinse, which is as effective as the prescribed mouthwash. - Continue using salt water and baking soda rinse for oral care - Use prescribed mouthwash as needed  Chemotherapy-induced peripheral neuropathy Reports cold sensitivity and tingling numbness, which  persisted longer after the second cycle. Acknowledges as a  manageable side effect of treatment.  History of myocardial infarction Previous myocardial infarction noted. Discussion of potential heart risks with chemotherapy, but no current contraindications for treatment. - Monitor for any cardiac symptoms during chemotherapy  Chronic Migraine Chronic migraines with severe episodes lasting three to four days a week. - CT head on 03/13/2024 showed no evidence of metastatic disease.  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   I reviewed lab results and outside records for this visit and discussed relevant results with the patient. Diagnosis, plan of care and treatment options were also discussed in detail with the patient. Opportunity provided to ask questions and answers provided to his apparent satisfaction. Provided instructions to call our clinic with any problems, questions or concerns prior to return visit. I recommended to continue follow-up with PCP and sub-specialists. He verbalized understanding and agreed with the plan.   Derrick Johnson have been consulted in the planning of this patient's care.  I spent a total of 30 minutes during this encounter with the patient including review of chart and various tests results, discussions about plan of care and coordination of care plan.   Derrick Patten, MD  06/04/2024 4:19 PM  [facility] [place] - A DEPT OF [name] [address] [place] [address] Dept: [phone] Dept Fax: [phone]    CHIEF COMPLAINT/ REASON FOR VISIT:   Derrick Johnson, initially diagnosed and treated in 2021 with concurrent chemoradiation using Xeloda .  Lost to follow-up.  Now with metastatic recurrence in the lungs, biopsy-proven in June 2025.  Current Treatment: Palliative systemic treatment with FOLFOX started from 03/28/2024.  Avastin  was added from cycle 2 onwards.   INTERVAL HISTORY:    Discussed the use of AI scribe  software for clinical note transcription with the patient, who gave verbal consent to proceed.  History of Present Illness  [name] is a 68 year old male undergoing treatment for Johnson who presents for follow-up regarding treatment side effects.  He is experiencing significant side effects from his Johnson treatment, particularly oral mucositis. His mouth is swollen, making it difficult to eat and talk. Although the mouth sores have improved significantly, they are still present. He has been using a salt water and baking soda rinse, which he feels is as effective as the prescribed mouthwash.  His appetite has returned, and he was able to eat a full meal a couple of days ago, which he believes has helped him regain some weight. No tingling or numbness is reported.  His white blood cell count is trending down, but he is still able to proceed with treatment without adjustments at this time. Neutrophil count is at 1,800.  He previously had concerns about tapeworms, which were investigated and determined to be part of the lining of his intestines rather than a parasitic infection.   I have reviewed the past medical history, past surgical history, social history and family history with the patient and they are unchanged from previous note.  HISTORY OF PRESENT ILLNESS:   ONCOLOGY HISTORY:   Colonoscopy 10/29/2019-Derrick mass at approximately 10-11 cm above the anus, biopsied and tattooed, pathology confirmed invasive well-differentiated Derrick, normal mismatch repair protein expression   CTs 11/12/2019-mild mid Derrick wall thickening.  2 small perirectal nodes measuring 3 to 4 mm   Lower EUS 11/13/2019-tumor in the proximal Johnson.  Proximal posterolateral Derrick mass.  2 abnormal  lymph nodes in the perirectal region.  Staged T3N1 by endorectal ultrasound.    Previously he was seen by Dr. Cloretta in our clinic.   Xeloda /radiation 11/25/2019-01/01/2020   He was supposed to  undergo surgical resection under the direction of Dr. Debby in June 2021.  Patient apparently had dental infections around that time and needed dental extractions and patient deferred surgery and did not seek follow-up care either with the surgeons or with our department.  He also did not have follow-up appointments with gastroenterology.   His PCP referred him back to his and he reestablished care with us  on 01/30/2024.  CEA was normal at 1.68.  CBCD and CMP were unremarkable.  Plan made for restaging CT scan of the chest, abdomen and pelvis and referral sent to GI for endoscopic evaluation.  Patient cannot get MRIs because of nerve stimulator in place.   On 02/08/2024, restaging CT chest, abdomen and pelvis showed multiple lung nodules, largest 1 measuring 1.9 cm, concerning for pulmonary metastatic disease.  Previously seen circumferential superior Derrick mass was no longer distinctly visible.  No evidence of lymphadenopathy or metastatic disease in the abdomen or pelvis.   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  No evidence of metastatic adenopathy in the chest or evidence of other metastatic disease in the abdomen or pelvis.  On 02/27/2024, Dr. Shelah performed flexible video fiberoptic bronchoscopy with robotic assistance and biopsies of right upper lobe lung nodule.  Pathology came back positive for Derrick.  Immunostains were positive for CK20 and CDX2, negative for CK7 and TTF-1, consistent with a colorectal primary.  cTx,cNx,pM1a, Stage IV A disease.   All treatment options are palliative in nature, given stage IV disease.    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  MSI stable.  Overall no actionable mutations.  He had an appointment on 03/15/2024 with cardiothoracic surgery Dr. Shyrl for  possible resection of the lung lesions:  he likely is out of his radiation window to undergo Derrick Johnson resection. If this cannot be resected there is minimal advantage to resect the pulmonary nodules   Palliative systemic treatment with FOLFOX started from 03/28/2024.  Avastin  was added from cycle 2 onwards.    Oncology History  Derrick Johnson (HCC)  11/07/2019 Initial Diagnosis   Derrick Johnson (HCC)   03/12/2024 Johnson Staging   Staging form: Colon and Johnson, AJCC 8th Edition - Clinical stage from 03/12/2024: Stage IVA (rcTX, rcNX, rpM1a) - Signed by Autumn Millman, MD on 03/12/2024 Stage prefix: Recurrence   03/28/2024 -  Chemotherapy   Patient is on Treatment Plan : COLORECTAL FOLFOX + Bevacizumab  q14d         REVIEW OF SYSTEMS:   Review of Systems - Oncology  All other pertinent systems were reviewed with the patient and are negative.  ALLERGIES: He is allergic to latex.  MEDICATIONS:  Current Outpatient Medications  Medication Sig Dispense Refill   albuterol  (PROVENTIL ) (2.5 MG/3ML) 0.083% nebulizer solution Take 2.5 mg by nebulization every 6 (six) hours as needed for wheezing or shortness of breath.      albuterol  (VENTOLIN  HFA) 108 (90 Base) MCG/ACT inhaler Inhale 1-2 puffs into the lungs every 6 (six) hours as needed for wheezing or shortness of breath. 1 Inhaler 5   aspirin  81 MG chewable tablet Chew 1 tablet (81 mg  total) by mouth daily. 90 tablet 3   atorvastatin  (LIPITOR) 80 MG tablet Take 1 tablet (80 mg total) by mouth daily. 90 tablet 3   clopidogrel  (PLAVIX ) 75 MG tablet Take 1 tablet (75 mg total) by mouth daily. 90 tablet 1   cyclobenzaprine (FLEXERIL) 10 MG tablet Take 10 mg by mouth every 8 (eight) hours as needed for muscle spasms.     dexamethasone  (DECADRON ) 4 MG tablet Take 2 tablets (8 mg total) by mouth daily. Start the day after chemotherapy for 2 days. Take with food. 30 tablet 1   furosemide  (LASIX ) 20 MG tablet Take 1 tablet (20  mg total) by mouth daily. 90 tablet 3   lidocaine -prilocaine  (EMLA ) cream Apply to affected area once 30 g 3   linaclotide  (LINZESS ) 145 MCG CAPS capsule Take 1 capsule (145 mcg total) by mouth daily before breakfast. 30 capsule 3   magic mouthwash (nystatin , lidocaine , diphenhydrAMINE, alum & mag hydroxide) suspension Take 5 mLs by mouth every 4 (four) hours as needed for mouth pain. 500 mL 6   methadone  (DOLOPHINE ) 10 MG tablet Take 1 tablet (10 mg total) by mouth in the morning, at noon, in the evening, and at bedtime.     [START ON 06/07/2024] methylphenidate  (RITALIN ) 20 MG tablet Take 1 tablet (20 mg total) by mouth 3 (three) times daily. 180 tablet 0   metoprolol  succinate (TOPROL -XL) 25 MG 24 hr tablet Take 1 tablet (25 mg total) by mouth at bedtime.     nitroGLYCERIN  (NITROSTAT ) 0.4 MG SL tablet Place 1 tablet (0.4 mg total) under the tongue every 5 (five) minutes x 3 doses as needed for chest pain. 25 tablet 3   ondansetron  (ZOFRAN ) 8 MG tablet Take 1 tablet (8 mg total) by mouth every 8 (eight) hours as needed for nausea or vomiting. Start on the third day after chemotherapy. 30 tablet 1   polyethylene glycol (MIRALAX) 17 g packet Take 17 g by mouth daily.     prochlorperazine  (COMPAZINE ) 10 MG tablet Take 1 tablet (10 mg total) by mouth every 6 (six) hours as needed for nausea or vomiting. 30 tablet 1   sacubitril -valsartan  (ENTRESTO ) 24-26 MG Take 1 tablet by mouth 2 (two) times daily. 180 tablet 3   spironolactone  (ALDACTONE ) 25 MG tablet Take 0.5 tablets (12.5 mg total) by mouth daily. 45 tablet 3   SUMAtriptan  (IMITREX ) 100 MG tablet Take 100 mg by mouth every 2 (two) hours as needed for migraine.     tamsulosin (FLOMAX) 0.4 MG CAPS capsule Take 0.4-0.8 mg by mouth See admin instructions. Take 0.4mg  (1 capsule) by mouth daily for 2 weeks, then increase to 0.8mg  (2 capsules) daily,     traZODone  (DESYREL ) 150 MG tablet Take 0.5 tablets (75 mg total) by mouth at bedtime as needed for  sleep.     VITAMIN D -VITAMIN K PO Take 1 capsule by mouth daily. 10,000IU/ 100 mcg     Wheat Dextrin (BENEFIBER PO) Take 1 tablet by mouth 3 (three) times daily as needed (Constipation per wating report).     No current facility-administered medications for this visit.   Facility-Administered Medications Ordered in Other Visits  Medication Dose Route Frequency Provider Last Rate Last Admin   0.9 %  sodium chloride  infusion   Intravenous Continuous Prem Coykendall, MD 10 mL/hr at 06/04/24 1012 New Bag at 06/04/24 1012   clindamycin  (CLEOCIN ) 900 mg in dextrose  5 % 50 mL IVPB  900 mg Intravenous 60 min Pre-Op Debby Hila,  MD       And   gentamicin  (GARAMYCIN ) 5 mg/kg in dextrose  5 % 50 mL IVPB  5 mg/kg Intravenous 60 min Pre-Op Debby Hila, MD       dextrose  5 % solution   Intravenous Continuous Thetis Schwimmer, MD 10 mL/hr at 06/04/24 1135 New Bag at 06/04/24 1135   fluorouracil  (ADRUCIL ) 5,000 mg in sodium chloride  0.9 % 150 mL chemo infusion  2,400 mg/m2 (Treatment Plan Recorded) Intravenous 1 day or 1 dose Jaskarn Schweer, MD   5,000 mg at 06/04/24 1407     VITALS:   Blood pressure 128/81, pulse 60, temperature 97.8 F (36.6 C), temperature source Temporal, resp. rate 18, height 5' 9 (1.753 m), weight 213 lb 3.2 oz (96.7 kg), SpO2 99%.  Wt Readings from Last 3 Encounters:  06/04/24 213 lb 3.2 oz (96.7 kg)  05/28/24 207 lb 1.9 oz (93.9 kg)  05/21/24 211 lb 8 oz (95.9 kg)    Body mass index is 31.48 kg/m.    Onc Performance Status - 06/04/24 0909       ECOG Perf Status   ECOG Perf Status Ambulatory and capable of all selfcare but unable to carry out any work activities.  Up and about more than 50% of waking hours      KPS SCALE   KPS % SCORE Requires occasional assistance but is able to care for most needs             PHYSICAL EXAM:   Physical Exam Constitutional:      General: He is not in acute distress.    Appearance: Normal appearance.  HENT:     Head:  Normocephalic and atraumatic.  Eyes:     Conjunctiva/sclera: Conjunctivae normal.  Cardiovascular:     Rate and Rhythm: Normal rate and regular rhythm.  Pulmonary:     Effort: Pulmonary effort is normal. No respiratory distress.  Abdominal:     General: There is no distension.  Neurological:     General: No focal deficit present.     Mental Status: He is alert and oriented to person, place, and time.  Psychiatric:        Mood and Affect: Mood normal.        Behavior: Behavior normal.      LABORATORY DATA:   I have reviewed the data as listed.  Results for orders placed or performed in visit on 06/04/24  CMP ([facility] only)  Result Value Ref Range   Sodium 138 135 - 145 mmol/L   Potassium 3.8 3.5 - 5.1 mmol/L   Chloride 104 98 - 111 mmol/L   CO2 23 22 - 32 mmol/L   Glucose, Bld 110 (H) 70 - 99 mg/dL   BUN 8 8 - 23 mg/dL   Creatinine 8.99 9.38 - 1.24 mg/dL   Calcium  8.9 8.9 - 10.3 mg/dL   Total Protein 6.5 6.5 - 8.1 g/dL   Albumin 3.8 3.5 - 5.0 g/dL   AST 38 15 - 41 U/L   ALT 59 (H) 0 - 44 U/L   Alkaline Phosphatase 106 38 - 126 U/L   Total Bilirubin 0.5 0.0 - 1.2 mg/dL   GFR, Estimated >39 >39 mL/min   Anion gap 11 5 - 15  CBC with Differential ([facility] Only)  Result Value Ref Range   WBC Count 3.7 (L) 4.0 - 10.5 K/uL   RBC 4.93 4.22 - 5.81 MIL/uL   Hemoglobin 14.4 13.0 - 17.0 g/dL   HCT  41.7 39.0 - 52.0 %   MCV 84.6 80.0 - 100.0 fL   MCH 29.2 26.0 - 34.0 pg   MCHC 34.5 30.0 - 36.0 g/dL   RDW 80.7 (H) 88.4 - 84.4 %   Platelet Count 163 150 - 400 K/uL   nRBC 0.0 0.0 - 0.2 %   Neutrophils Relative % 47 %   Neutro Abs 1.8 1.7 - 7.7 K/uL   Lymphocytes Relative 32 %   Lymphs Abs 1.2 0.7 - 4.0 K/uL   Monocytes Relative 18 %   Monocytes Absolute 0.7 0.1 - 1.0 K/uL   Eosinophils Relative 1 %   Eosinophils Absolute 0.0 0.0 - 0.5 K/uL   Basophils Relative 1 %   Basophils Absolute 0.0 0.0 - 0.1 K/uL   Immature Granulocytes 1 %   Abs Immature Granulocytes  0.05 0.00 - 0.07 K/uL  Total Protein, Urine dipstick  Result Value Ref Range   Protein, ur NEGATIVE NEGATIVE mg/dL      RADIOGRAPHIC STUDIES:  No recent pertinent imaging available to review.  CODE STATUS:  Code Status History     Date Active Date Inactive Code Status Order ID Comments User Context   11/27/2023 2206 11/29/2023 1747 Full Code [id number]  Cesario Chamorro, MD Inpatient    Questions for Most Recent Historical Code Status (Order [id number])     Question Answer   By: Consent: discussion documented in EHR            Orders Placed This Encounter  Procedures   CBC with Differential ([facility] Only)    Standing Status:   Future    Expected Date:   07/02/2024    Expiration Date:   07/02/2025   CMP ([facility] only)    Standing Status:   Future    Expected Date:   07/02/2024    Expiration Date:   07/02/2025   Total Protein, Urine dipstick    Standing Status:   Future    Expected Date:   07/02/2024    Expiration Date:   07/02/2025     Future Appointments  Date Time Provider Department Johnson  06/06/2024 12:15 PM [facility] INFUSION [facility] None  06/14/2024  3:00 PM [facility] 1 [facility] [place]  06/18/2024  8:45 AM [facility] INFUSION [facility] None  06/18/2024  8:45 AM [facility] PHLEBOTOMIST [facility] None  06/18/2024  9:15 AM Toula Miyasaki, MD [facility] None  06/18/2024  9:45 AM [facility] INFUSION [facility] None  06/20/2024  2:00 PM [facility] INFUSION [facility] None  07/02/2024  8:45 AM [facility] INFUSION [facility] None  07/02/2024  8:45 AM [facility] PHLEBOTOMIST [facility] None  07/02/2024  9:15 AM Clorene Nerio, MD [facility] None  07/02/2024  9:45 AM [facility] INFUSION [facility] None  07/04/2024  1:45 PM [facility] INFUSION [facility] None  07/22/2024 10:30 AM [facility]  07/29/2024 11:10 AM Chandra Toribio POUR, MD [facility]    This document was completed utilizing Engineer, civil (consulting). Grammatical  errors, random word insertions, pronoun errors, and incomplete sentences are an occasional consequence of this system due to software limitations, ambient noise, and hardware issues. Any formal questions or concerns about the content, text or information contained within the body of this dictation should be directly addressed to the provider for clarification.

## 2024-06-06 ENCOUNTER — Other Ambulatory Visit (HOSPITAL_BASED_OUTPATIENT_CLINIC_OR_DEPARTMENT_OTHER): Payer: Self-pay

## 2024-06-06 ENCOUNTER — Other Ambulatory Visit: Payer: Self-pay | Admitting: Oncology

## 2024-06-06 ENCOUNTER — Inpatient Hospital Stay: Attending: Oncology

## 2024-06-06 VITALS — BP 126/103 | HR 105 | Temp 98.2°F | Resp 18

## 2024-06-06 DIAGNOSIS — G620 Drug-induced polyneuropathy: Secondary | ICD-10-CM | POA: Insufficient documentation

## 2024-06-06 DIAGNOSIS — Z79899 Other long term (current) drug therapy: Secondary | ICD-10-CM | POA: Insufficient documentation

## 2024-06-06 DIAGNOSIS — C7801 Secondary malignant neoplasm of right lung: Secondary | ICD-10-CM | POA: Insufficient documentation

## 2024-06-06 DIAGNOSIS — C20 Malignant neoplasm of rectum: Secondary | ICD-10-CM

## 2024-06-06 DIAGNOSIS — I252 Old myocardial infarction: Secondary | ICD-10-CM | POA: Insufficient documentation

## 2024-06-06 DIAGNOSIS — Z7982 Long term (current) use of aspirin: Secondary | ICD-10-CM | POA: Insufficient documentation

## 2024-06-06 DIAGNOSIS — Z7902 Long term (current) use of antithrombotics/antiplatelets: Secondary | ICD-10-CM | POA: Insufficient documentation

## 2024-06-06 DIAGNOSIS — T451X5A Adverse effect of antineoplastic and immunosuppressive drugs, initial encounter: Secondary | ICD-10-CM | POA: Insufficient documentation

## 2024-06-06 DIAGNOSIS — Z923 Personal history of irradiation: Secondary | ICD-10-CM | POA: Insufficient documentation

## 2024-06-06 DIAGNOSIS — M7989 Other specified soft tissue disorders: Secondary | ICD-10-CM | POA: Insufficient documentation

## 2024-06-06 DIAGNOSIS — K1230 Oral mucositis (ulcerative), unspecified: Secondary | ICD-10-CM | POA: Insufficient documentation

## 2024-06-06 DIAGNOSIS — Z5189 Encounter for other specified aftercare: Secondary | ICD-10-CM | POA: Insufficient documentation

## 2024-06-06 DIAGNOSIS — Z79891 Long term (current) use of opiate analgesic: Secondary | ICD-10-CM | POA: Insufficient documentation

## 2024-06-06 DIAGNOSIS — I2510 Atherosclerotic heart disease of native coronary artery without angina pectoris: Secondary | ICD-10-CM | POA: Insufficient documentation

## 2024-06-06 DIAGNOSIS — Z8673 Personal history of transient ischemic attack (TIA), and cerebral infarction without residual deficits: Secondary | ICD-10-CM | POA: Insufficient documentation

## 2024-06-06 DIAGNOSIS — G43909 Migraine, unspecified, not intractable, without status migrainosus: Secondary | ICD-10-CM | POA: Insufficient documentation

## 2024-06-06 DIAGNOSIS — Z7952 Long term (current) use of systemic steroids: Secondary | ICD-10-CM | POA: Insufficient documentation

## 2024-06-06 DIAGNOSIS — Z5111 Encounter for antineoplastic chemotherapy: Secondary | ICD-10-CM | POA: Insufficient documentation

## 2024-06-06 DIAGNOSIS — D72819 Decreased white blood cell count, unspecified: Secondary | ICD-10-CM | POA: Insufficient documentation

## 2024-06-06 DIAGNOSIS — K5900 Constipation, unspecified: Secondary | ICD-10-CM | POA: Insufficient documentation

## 2024-06-06 MED ORDER — SODIUM CHLORIDE 0.9% FLUSH
10.0000 mL | INTRAVENOUS | Status: DC | PRN
  Administered 2024-06-06: 10 mL

## 2024-06-07 ENCOUNTER — Encounter: Payer: Self-pay | Admitting: Oncology

## 2024-06-12 ENCOUNTER — Other Ambulatory Visit: Payer: Self-pay | Admitting: Family Medicine

## 2024-06-12 DIAGNOSIS — Z79899 Other long term (current) drug therapy: Secondary | ICD-10-CM

## 2024-06-12 DIAGNOSIS — Z5181 Encounter for therapeutic drug level monitoring: Secondary | ICD-10-CM

## 2024-06-12 DIAGNOSIS — G894 Chronic pain syndrome: Secondary | ICD-10-CM

## 2024-06-12 DIAGNOSIS — G950 Syringomyelia and syringobulbia: Secondary | ICD-10-CM

## 2024-06-12 DIAGNOSIS — M797 Fibromyalgia: Secondary | ICD-10-CM

## 2024-06-12 MED ORDER — METHADONE HCL 10 MG PO TABS: 10.0000 mg | ORAL_TABLET | Freq: Four times a day (QID) | ORAL | Status: DC

## 2024-06-12 NOTE — Telephone Encounter (Signed)
 Copied from CRM [id number]. Topic: Clinical - Medication Refill >> Jun 12, 2024  1:43 PM Amy B wrote: Medication:  methadone  (DOLOPHINE ) 10 MG tablet   Has the patient contacted their pharmacy? No (Agent: If no, request that the patient contact the pharmacy for the refill. If patient does not wish to contact the pharmacy document the reason why and proceed with request.) (Agent: If yes, when and what did the pharmacy advise?)  This is the patient's preferred pharmacy:  [facility] - [place], [place] - [address] [place] [address] Phone: [phone] Fax: [phone]  Is this the correct pharmacy for this prescription? Yes If no, delete pharmacy and type the correct one.   Has the prescription been filled recently? No  Is the patient out of the medication? No  Has the patient been seen for an appointment in the last year OR does the patient have an upcoming appointment? Yes  Can we respond through MyChart? Yes  Agent: Please be advised that Rx refills may take up to 3 business days. We ask that you follow-up with your pharmacy.

## 2024-06-14 ENCOUNTER — Ambulatory Visit (HOSPITAL_COMMUNITY)
Admission: RE | Admit: 2024-06-14 | Discharge: 2024-06-14 | Disposition: A | Discharge status: Home / Self Care | Source: Ambulatory Visit | Attending: Oncology | Admitting: Oncology

## 2024-06-14 DIAGNOSIS — C20 Malignant neoplasm of rectum: Secondary | ICD-10-CM | POA: Insufficient documentation

## 2024-06-14 MED ORDER — HEPARIN SOD (PORK) LOCK FLUSH 100 UNIT/ML IV SOLN: 500.0000 [IU] | Freq: Once | INTRAVENOUS | Status: DC

## 2024-06-14 MED ORDER — HEPARIN SOD (PORK) LOCK FLUSH 100 UNIT/ML IV SOLN
INTRAVENOUS | Status: AC
  Filled 2024-06-14: qty 5

## 2024-06-14 MED ORDER — SODIUM CHLORIDE (PF) 0.9 % IJ SOLN
INTRAMUSCULAR | Status: AC
  Filled 2024-06-14: qty 50

## 2024-06-14 MED ORDER — IOHEXOL 300 MG/ML  SOLN
100.0000 mL | Freq: Once | INTRAMUSCULAR | Status: AC | PRN
  Administered 2024-06-14: 100 mL via INTRAVENOUS

## 2024-06-18 ENCOUNTER — Other Ambulatory Visit: Payer: Self-pay

## 2024-06-18 ENCOUNTER — Inpatient Hospital Stay

## 2024-06-18 ENCOUNTER — Encounter: Payer: Self-pay | Admitting: Oncology

## 2024-06-18 ENCOUNTER — Other Ambulatory Visit: Payer: Self-pay | Admitting: Family Medicine

## 2024-06-18 ENCOUNTER — Inpatient Hospital Stay: Admitting: Oncology

## 2024-06-18 VITALS — BP 111/78 | HR 108 | Resp 18

## 2024-06-18 VITALS — BP 105/88 | HR 103 | Temp 98.0°F | Resp 18 | Ht 69.0 in | Wt 204.9 lb

## 2024-06-18 DIAGNOSIS — M797 Fibromyalgia: Secondary | ICD-10-CM

## 2024-06-18 DIAGNOSIS — T451X5A Adverse effect of antineoplastic and immunosuppressive drugs, initial encounter: Secondary | ICD-10-CM | POA: Diagnosis not present

## 2024-06-18 DIAGNOSIS — Z5189 Encounter for other specified aftercare: Secondary | ICD-10-CM | POA: Diagnosis not present

## 2024-06-18 DIAGNOSIS — M7989 Other specified soft tissue disorders: Secondary | ICD-10-CM | POA: Diagnosis not present

## 2024-06-18 DIAGNOSIS — C20 Malignant neoplasm of rectum: Secondary | ICD-10-CM

## 2024-06-18 DIAGNOSIS — C7801 Secondary malignant neoplasm of right lung: Secondary | ICD-10-CM | POA: Diagnosis not present

## 2024-06-18 DIAGNOSIS — K5900 Constipation, unspecified: Secondary | ICD-10-CM | POA: Diagnosis not present

## 2024-06-18 DIAGNOSIS — Z79899 Other long term (current) drug therapy: Secondary | ICD-10-CM | POA: Diagnosis not present

## 2024-06-18 DIAGNOSIS — D701 Agranulocytosis secondary to cancer chemotherapy: Secondary | ICD-10-CM | POA: Insufficient documentation

## 2024-06-18 DIAGNOSIS — Z923 Personal history of irradiation: Secondary | ICD-10-CM | POA: Diagnosis not present

## 2024-06-18 DIAGNOSIS — I252 Old myocardial infarction: Secondary | ICD-10-CM | POA: Diagnosis not present

## 2024-06-18 DIAGNOSIS — Z7982 Long term (current) use of aspirin: Secondary | ICD-10-CM | POA: Diagnosis not present

## 2024-06-18 DIAGNOSIS — G43909 Migraine, unspecified, not intractable, without status migrainosus: Secondary | ICD-10-CM | POA: Diagnosis not present

## 2024-06-18 DIAGNOSIS — Z79891 Long term (current) use of opiate analgesic: Secondary | ICD-10-CM | POA: Diagnosis not present

## 2024-06-18 DIAGNOSIS — Z5111 Encounter for antineoplastic chemotherapy: Secondary | ICD-10-CM | POA: Diagnosis present

## 2024-06-18 DIAGNOSIS — G894 Chronic pain syndrome: Secondary | ICD-10-CM

## 2024-06-18 DIAGNOSIS — G620 Drug-induced polyneuropathy: Secondary | ICD-10-CM | POA: Diagnosis not present

## 2024-06-18 DIAGNOSIS — I2510 Atherosclerotic heart disease of native coronary artery without angina pectoris: Secondary | ICD-10-CM | POA: Diagnosis not present

## 2024-06-18 DIAGNOSIS — Z7902 Long term (current) use of antithrombotics/antiplatelets: Secondary | ICD-10-CM | POA: Diagnosis not present

## 2024-06-18 DIAGNOSIS — Z7952 Long term (current) use of systemic steroids: Secondary | ICD-10-CM | POA: Diagnosis not present

## 2024-06-18 DIAGNOSIS — Z5181 Encounter for therapeutic drug level monitoring: Secondary | ICD-10-CM

## 2024-06-18 DIAGNOSIS — Z8673 Personal history of transient ischemic attack (TIA), and cerebral infarction without residual deficits: Secondary | ICD-10-CM | POA: Diagnosis not present

## 2024-06-18 DIAGNOSIS — D72819 Decreased white blood cell count, unspecified: Secondary | ICD-10-CM | POA: Diagnosis not present

## 2024-06-18 DIAGNOSIS — G950 Syringomyelia and syringobulbia: Secondary | ICD-10-CM

## 2024-06-18 DIAGNOSIS — K1230 Oral mucositis (ulcerative), unspecified: Secondary | ICD-10-CM | POA: Diagnosis not present

## 2024-06-18 LAB — CBC WITH DIFFERENTIAL (CANCER CENTER ONLY)
Abs Immature Granulocytes: 0.02 K/uL (ref 0.00–0.07)
Basophils Absolute: 0 K/uL (ref 0.0–0.1)
Basophils Relative: 0 %
Eosinophils Absolute: 0 K/uL (ref 0.0–0.5)
Eosinophils Relative: 0 %
HCT: 38.5 % — ABNORMAL LOW (ref 39.0–52.0)
Hemoglobin: 13.7 g/dL (ref 13.0–17.0)
Immature Granulocytes: 1 %
Lymphocytes Relative: 37 %
Lymphs Abs: 1.1 K/uL (ref 0.7–4.0)
MCH: 30.2 pg (ref 26.0–34.0)
MCHC: 35.6 g/dL (ref 30.0–36.0)
MCV: 85 fL (ref 80.0–100.0)
Monocytes Absolute: 0.6 K/uL (ref 0.1–1.0)
Monocytes Relative: 19 %
Neutro Abs: 1.3 K/uL — ABNORMAL LOW (ref 1.7–7.7)
Neutrophils Relative %: 43 %
Platelet Count: 170 K/uL (ref 150–400)
RBC: 4.53 MIL/uL (ref 4.22–5.81)
RDW: 20.6 % — ABNORMAL HIGH (ref 11.5–15.5)
WBC Count: 3 K/uL — ABNORMAL LOW (ref 4.0–10.5)
nRBC: 0 % (ref 0.0–0.2)

## 2024-06-18 LAB — CMP (CANCER CENTER ONLY)
ALT: 80 U/L — ABNORMAL HIGH (ref 0–44)
AST: 50 U/L — ABNORMAL HIGH (ref 15–41)
Albumin: 4 g/dL (ref 3.5–5.0)
Alkaline Phosphatase: 96 U/L (ref 38–126)
Anion gap: 11 (ref 5–15)
BUN: 8 mg/dL (ref 8–23)
CO2: 24 mmol/L (ref 22–32)
Calcium: 8.9 mg/dL (ref 8.9–10.3)
Chloride: 102 mmol/L (ref 98–111)
Creatinine: 1.02 mg/dL (ref 0.61–1.24)
GFR, Estimated: >60 mL/min (ref >60)
Glucose, Bld: 87 mg/dL (ref 70–99)
Potassium: 3.5 mmol/L (ref 3.5–5.1)
Sodium: 137 mmol/L (ref 135–145)
Total Bilirubin: 1.1 mg/dL (ref 0.0–1.2)
Total Protein: 6.7 g/dL (ref 6.5–8.1)

## 2024-06-18 LAB — TOTAL PROTEIN, URINE DIPSTICK: Protein, ur: NEGATIVE mg/dL

## 2024-06-18 MED ORDER — SODIUM CHLORIDE 0.9 % IV SOLN
5.0000 mg/kg | Freq: Once | INTRAVENOUS | Status: AC
  Administered 2024-06-18: 500 mg via INTRAVENOUS
  Filled 2024-06-18: qty 16

## 2024-06-18 MED ORDER — LEUCOVORIN CALCIUM INJECTION 350 MG
400.0000 mg/m2 | Freq: Once | INTRAVENOUS | Status: AC
  Administered 2024-06-18: 880 mg via INTRAVENOUS
  Filled 2024-06-18: qty 44

## 2024-06-18 MED ORDER — DEXTROSE 5 % IV SOLN: INTRAVENOUS | Status: DC

## 2024-06-18 MED ORDER — SODIUM CHLORIDE 0.9 % IV SOLN: INTRAVENOUS | Status: DC

## 2024-06-18 MED ORDER — OXALIPLATIN CHEMO INJECTION 100 MG/20ML
180.0000 mg | Freq: Once | INTRAVENOUS | Status: AC
  Administered 2024-06-18: 180 mg via INTRAVENOUS
  Filled 2024-06-18: qty 36

## 2024-06-18 MED ORDER — PALONOSETRON HCL INJECTION 0.25 MG/5ML
0.2500 mg | Freq: Once | INTRAVENOUS | Status: AC
  Administered 2024-06-18: 0.25 mg via INTRAVENOUS
  Filled 2024-06-18: qty 5

## 2024-06-18 MED ORDER — FLUOROURACIL CHEMO INJECTION 2.5 GM/50ML
400.0000 mg/m2 | Freq: Once | INTRAVENOUS | Status: AC
  Administered 2024-06-18: 900 mg via INTRAVENOUS
  Filled 2024-06-18: qty 18

## 2024-06-18 MED ORDER — DEXAMETHASONE SOD PHOSPHATE PF 10 MG/ML IJ SOLN
10.0000 mg | Freq: Once | INTRAMUSCULAR | Status: AC
  Administered 2024-06-18: 10 mg via INTRAVENOUS

## 2024-06-18 MED ORDER — SODIUM CHLORIDE 0.9 % IV SOLN
2400.0000 mg/m2 | INTRAVENOUS | Status: DC
  Administered 2024-06-18: 5000 mg via INTRAVENOUS
  Filled 2024-06-18: qty 100

## 2024-06-18 NOTE — Patient Instructions (Signed)
 [facility] A DEPT OF [facility]  Discharge Instructions: Thank you for choosing [facility] to provide your oncology and hematology care.   If you have a lab appointment with the Cancer Center, please go directly to the Cancer Center and check in at the registration area.   Wear comfortable clothing and clothing appropriate for easy access to any Portacath or PICC line.   We strive to give you quality time with your provider. You may need to reschedule your appointment if you arrive late (15 or more minutes).  Arriving late affects you and other patients whose appointments are after yours.  Also, if you miss three or more appointments without notifying the office, you may be dismissed from the clinic at the provider's discretion.      For prescription refill requests, have your pharmacy contact our office and allow 72 hours for refills to be completed.    Today you received the following chemotherapy and/or immunotherapy agents: bevacizumab, oxaliplatin, leucovorin, fluorouracil      To help prevent nausea and vomiting after your treatment, we encourage you to take your nausea medication as directed.  BELOW ARE SYMPTOMS THAT SHOULD BE REPORTED IMMEDIATELY: *FEVER GREATER THAN 100.4 F (38 C) OR HIGHER *CHILLS OR SWEATING *NAUSEA AND VOMITING THAT IS NOT CONTROLLED WITH YOUR NAUSEA MEDICATION *UNUSUAL SHORTNESS OF BREATH *UNUSUAL BRUISING OR BLEEDING *URINARY PROBLEMS (pain or burning when urinating, or frequent urination) *BOWEL PROBLEMS (unusual diarrhea, constipation, pain near the anus) TENDERNESS IN MOUTH AND THROAT WITH OR WITHOUT PRESENCE OF ULCERS (sore throat, sores in mouth, or a toothache) UNUSUAL RASH, SWELLING OR PAIN  UNUSUAL VAGINAL DISCHARGE OR ITCHING   Items with * indicate a potential emergency and should be followed up as soon as possible or go to the Emergency Department if any problems should occur.  Please show the  CHEMOTHERAPY ALERT CARD or IMMUNOTHERAPY ALERT CARD at check-in to the Emergency Department and triage nurse.  Should you have questions after your visit or need to cancel or reschedule your appointment, please contact [facility] - A DEPT OF [facility]  Dept: [phone]  and follow the prompts.  Office hours are 8:00 a.m. to 4:30 p.m. Monday - Friday. Please note that voicemails left after 4:00 p.m. may not be returned until the following business day.  We are closed weekends and major holidays. You have access to a nurse at all times for urgent questions. Please call the main number to the clinic Dept: [phone] and follow the prompts.   For any non-urgent questions, you may also contact your provider using MyChart. We now offer e-Visits for anyone 38 and older to request care online for non-urgent symptoms. For details visit [email or web address].   Also download the MyChart app! Go to the app store, search "MyChart", open the app, select [facility], and log in with your MyChart username and password.

## 2024-06-18 NOTE — Assessment & Plan Note (Addendum)
 Please review oncology history for additional details and timeline of events.  Patient was originally diagnosed with rectal cancer in 2021 and was treated with concurrent chemoradiation with Xeloda  with eventual plans for surgery.  He was lost to follow-up after that.  Restaging CT chest abdomen pelvis on 02/08/2024 suggested multiple lung nodules, concerning for metastatic recurrence.  On 02/27/2024, Dr. Shelah performed flexible video fiberoptic bronchoscopy with robotic assistance and biopsies of right upper lobe lung nodule.  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  Immunostains were positive for CK20 and CDX2, negative for CK7 and TTF-1, consistent with a colorectal primary.  cTx,cNx,pM1a, Stage IV A disease.  MSI stable.  NGS testing on the specimen showed no actionable mutations.  All treatment options are palliative in nature, given stage IV disease.    He had an appointment on 03/15/2024 with cardiothoracic surgery Dr. Shyrl for possible resection of the lung lesions:  he likely is out of his radiation window to undergo rectal cancer resection. If this cannot be resected there is minimal advantage to resect the pulmonary nodules   Palliative systemic treatment with FOLFOX started from 03/28/2024.  Avastin  was added from cycle 2 onwards.   Tolerating chemotherapy reasonably well overall with some expected side effects including mouth sores.  Symptoms controlled with supportive therapy including mouth rinses with baking soda and salt.  We prescribed Magic mouthwash previously.  Following completion of 6 cycles of FOLFOX plus Avastin , restaging CT chest, abdomen and pelvis on 06/14/2024 showed significant improvement in lung nodules.  Nonspecific 11 mm hypointense nodular focus in the anterior segment 5 of the liver may reflect focal fatty sparing, although hepatic metastasis could not be ruled out.  Patient cannot undergo MRIs.  Hence plan to monitor this.  Plan to continue current  management.  Due for cycle 7 of chemotherapy today.  Labs reveal progressive leukopenia with white count of 3000 but ANC is 1300 today.  No other dose-limiting toxicities.  We will proceed with standard dose chemotherapy and give him Neulasta on the day of 5-FU pump removal.  This has been previously authorized.  If progressive neuropathy or other side effects are noted, we will dose reduce oxaliplatin , followed by 5-FU.  Plan to continue current regimen and repeat imaging in approximately 3 months from the last scan.

## 2024-06-18 NOTE — Assessment & Plan Note (Signed)
 Leukopenia with white blood cell count at 3000, down from 3700. Potential bone pain post-injection, advised to take Claritin to minimize pain. - Administer Neulasta injection during pump removal to stimulate white blood cell production. - Advise taking Claritin for 2-3 days post-injection to minimize bone pain.

## 2024-06-18 NOTE — Progress Notes (Signed)
 Patient seen by Dr. Archie Patten Pasam today  Vitals are within treatment parameters:Yes   Labs are within treatment parameters: Yes   Treatment plan has been signed: Yes   Per physician team, Patient is ready for treatment and there are NO modifications to the treatment plan.

## 2024-06-18 NOTE — Progress Notes (Signed)
 Oxaliplatin  dose reduced to 180 mg to correspond 85 mg/m2 due to weight change per Dr. Clovis instructions.

## 2024-06-18 NOTE — Progress Notes (Signed)
 [facility]  ONCOLOGY CLINIC PROGRESS NOTE   Patient Care Team: Chandra Toribio POUR, MD as PCP - General (Family Medicine) Thukkani, Arun K, MD as PCP - Cardiology (Cardiology) Babs Arthea DASEN, MD as Consulting Physician (Physical Medicine and Rehabilitation) Debby Fidela CROME, NP as Nurse Practitioner (Physical Medicine and Rehabilitation) Lennard Lesta FALCON, MD as Consulting Physician (Gastroenterology)  PATIENT NAME: Derrick Johnson   MR#: [id number] DOB: June 03, 1956  Date of visit: 06/18/2024   ASSESSMENT & PLAN:   Derrick Johnson is a 68 y.o. gentleman with a past medical history of rectal cancer diagnosed in February 2021, treated with concurrent chemoradiation using Xeloda , completed treatment in April 2021, patient did not follow-up with surgery or with oncology departments after this. 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 He was referred back to our clinic by his PCP to reestablish care for his history of rectal cancer.  He has recurrence of disease with biopsy-proven lung mets.  Adenocarcinoma of rectum [facility]) Please review oncology history for additional details and timeline of events.  Patient was originally diagnosed with rectal cancer in 2021 and was treated with concurrent chemoradiation with Xeloda  with eventual plans for surgery.  He was lost to follow-up after that.  Restaging CT chest abdomen pelvis on 02/08/2024 suggested multiple lung nodules, concerning for metastatic recurrence.  On 02/27/2024, Dr. Shelah performed flexible video fiberoptic bronchoscopy with robotic assistance and biopsies of right upper lobe lung nodule.  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  Immunostains were positive for CK20 and CDX2, negative for CK7 and TTF-1, consistent with a colorectal  primary.  cTx,cNx,pM1a, Johnson IV A disease.  MSI stable.  NGS testing on the specimen showed no actionable mutations.  All treatment options are palliative in nature, given Johnson IV disease.    He had an appointment on 03/15/2024 with cardiothoracic surgery Dr. Shyrl for possible resection of the lung lesions:  he likely is out of his radiation window to undergo rectal cancer resection. If this cannot be resected there is minimal advantage to resect the pulmonary nodules   Palliative systemic treatment with FOLFOX started from 03/28/2024.  Avastin  was added from cycle 2 onwards.   Tolerating chemotherapy reasonably well overall with some expected side effects including mouth sores.  Symptoms controlled with supportive therapy including mouth rinses with baking soda and salt.  We prescribed Magic mouthwash previously.  Following completion of 6 cycles of FOLFOX plus Avastin , restaging CT chest, abdomen and pelvis on 06/14/2024 showed significant improvement in lung nodules.  Nonspecific 11 mm hypointense nodular focus in the anterior segment 5 of the liver may reflect focal fatty sparing, although hepatic metastasis could not be ruled out.  Patient cannot undergo MRIs.  Hence plan to monitor this.  Plan to continue current management.  Due for cycle 7 of chemotherapy today.  Labs reveal progressive leukopenia with white count of 3000 but ANC is 1300 today.  No other dose-limiting toxicities.  We will proceed with standard dose chemotherapy and give him Neulasta on the day of 5-FU pump removal.  This has been previously authorized.  If progressive neuropathy or other side effects are noted, we will dose reduce oxaliplatin , followed by 5-FU.  Plan to continue current regimen and repeat imaging in approximately 3 months from the last scan.   Chemotherapy-induced oral mucositis Persistent oral mucositis  with sores and swelling, affecting ability to wear dentures and eat. Magic Mouthwash  ineffective. Salt water and baking soda rinse equally effective. - Continue using salt water and baking soda rinse for oral mucositis.  Chemotherapy-induced peripheral neuropathy Persistent tingling and numbness in extremities, with swelling and pain in hands. Symptoms attributed to oxaliplatin . Patient prefers to continue current chemotherapy regimen despite neuropathy. - Monitor neuropathy symptoms and consider reducing oxaliplatin  if symptoms worsen.  History of myocardial infarction Previous myocardial infarction noted. Discussion of potential heart risks with chemotherapy, but no current contraindications for treatment. - Monitor for any cardiac symptoms during chemotherapy  Chronic Migraine Chronic migraines with severe episodes lasting three to four days a week. - CT head on 03/13/2024 showed no evidence of metastatic disease.  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   I reviewed lab results and outside records for this visit and discussed relevant results with the patient. Diagnosis, plan of care and treatment options were also discussed in detail with the patient. Opportunity provided to ask questions and answers provided to his apparent satisfaction. Provided instructions to call our clinic with any problems, questions or concerns prior to return visit. I recommended to continue follow-up with PCP and sub-specialists. He verbalized understanding and agreed with the plan.   NCCN guidelines have been consulted in the planning of this patient's care.  I spent a total of 40 minutes during this encounter with the patient including review of chart and various tests results, discussions about plan of care and coordination of care plan.   [name], MD  06/18/2024 6:13 PM  [facility] - A DEPT OF [name] [address] [place] [address] Dept: [phone] Dept Fax: [phone]     CHIEF COMPLAINT/ REASON FOR VISIT:   Rectal cancer, initially diagnosed and treated in 2021 with concurrent chemoradiation using Xeloda .  Lost to follow-up.  Now with metastatic recurrence in the lungs, biopsy-proven in June 2025.  Current Treatment: Palliative systemic treatment with FOLFOX started from 03/28/2024.  Avastin  was added from cycle 2 onwards.   INTERVAL HISTORY:    Discussed the use of AI scribe software for clinical note transcription with the patient, who gave verbal consent to proceed.  History of Present Illness  Demonta Wombles is a 68 year old male with lung cancer who presents with ongoing symptoms related to chemotherapy treatment.  He experiences persistent oral sores and swelling, which hinder his ability to wear dentures and eat. He has tried [employer] but finds salt water and baking soda rinses equally effective. Despite leaving his dentures out for several days, he continues to struggle with eating due to the swelling.  He experiences alternating episodes of constipation and diarrhea. Recently, he had diarrhea for two to three days and used Imodium for relief. He mentions a long drive to the clinic and avoids drinking fluids to prevent frequent bathroom trips, which contributes to dehydration.  He reports significant dizziness, especially when standing up quickly, and describes needing to sit down to manage the dizziness. He describes needing to sit down to manage the dizziness and acknowledges not staying well-hydrated to avoid frequent urination.  He has experienced weight fluctuations, noting a drop to 197 pounds during a period of major problems, but his weight has since increased to 205 pounds.  He describes persistent tingling and numbness in his hands, with swelling and pain that make it difficult to use his  fingers. He has observed micro tears in his thumb and has adapted by using his pinkies instead.  He is on multiple medications,  including inhalers, trazodone , Flexeril, and methadone . He finds it challenging to maintain a medication schedule due to the number of medications he is taking. He is in the process of switching medications and ran out of one today, which he notes causes constipation.  No specific treatment for neuropathy and unsure of all current medications. Acknowledges not drinking enough fluids recently.   I have reviewed the past medical history, past surgical history, social history and family history with the patient and they are unchanged from previous note.  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  2 small perirectal nodes measuring 3 to 4 mm   Lower EUS 11/13/2019-tumor in the proximal rectum.  Proximal posterolateral rectal mass.  2 abnormal lymph nodes in the perirectal region.  Staged T3N1 by endorectal ultrasound.    Previously he was seen by Dr. Cloretta in our clinic.   Xeloda /radiation 11/25/2019-01/01/2020   He was supposed to undergo surgical resection under the direction of Dr. Debby in June 2021.  Patient apparently had dental infections around that time and needed dental extractions and patient deferred surgery and did not seek follow-up care either with the surgeons or with our department.  He also did not have follow-up appointments with gastroenterology.   His PCP referred him back to his and he reestablished care with us  on 01/30/2024.  CEA was normal at 1.68.  CBCD and CMP were unremarkable.  Plan made for restaging CT scan of the chest, abdomen and pelvis and referral sent to GI for endoscopic evaluation.  Patient cannot get MRIs because of nerve stimulator in place.   On 02/08/2024, restaging CT chest, abdomen and pelvis  showed multiple lung nodules, largest 1 measuring 1.9 cm, concerning for pulmonary metastatic disease.  Previously seen circumferential superior rectal mass was no longer distinctly visible.  No evidence of lymphadenopathy or metastatic disease in the abdomen or pelvis.   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  No evidence of metastatic adenopathy in the chest or evidence of other metastatic disease in the abdomen or pelvis.  On 02/27/2024, Dr. Shelah performed flexible video fiberoptic bronchoscopy with robotic assistance and biopsies of right upper lobe lung nodule.  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  Immunostains were positive for CK20 and CDX2, negative for CK7 and TTF-1, consistent with a colorectal primary.  cTx,cNx,pM1a, Johnson IV A disease.   All treatment options are palliative in nature, given Johnson IV disease.    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  MSI stable.  Overall no actionable mutations.  He had an appointment on 03/15/2024 with cardiothoracic surgery Dr. Shyrl for possible resection of the lung lesions:  he likely is out of his radiation window to undergo rectal cancer resection. If this cannot be resected there is minimal advantage to resect the pulmonary nodules   Palliative systemic treatment with FOLFOX started from 03/28/2024.  Avastin  was added from cycle 2 onwards.   Following completion of 6 cycles of FOLFOX plus Avastin , restaging CT chest, abdomen and  pelvis on 06/14/2024 showed significant improvement in lung nodules.  Nonspecific 11 mm hypointense nodular focus in the anterior segment 5 of the liver may reflect focal fatty sparing, although hepatic metastasis could not be ruled out.  Patient cannot undergo MRIs.  Hence plan to monitor this.  Plan to continue  current management.  Oncology History  Adenocarcinoma of rectum (HCC)  11/07/2019 Initial Diagnosis   Adenocarcinoma of rectum (HCC)   03/12/2024 Cancer Staging   Staging form: Colon and Rectum, AJCC 8th Edition - Clinical Johnson from 03/12/2024: Johnson IVA (rcTX, rcNX, rpM1a) - Signed by Autumn Millman, MD on 03/12/2024 Johnson prefix: Recurrence   03/28/2024 -  Chemotherapy   Patient is on Treatment Plan : COLORECTAL FOLFOX + Bevacizumab  q14d         REVIEW OF SYSTEMS:   Review of Systems - Oncology  All other pertinent systems were reviewed with the patient and are negative.  ALLERGIES: He is allergic to latex.  MEDICATIONS:  Current Outpatient Medications  Medication Sig Dispense Refill   albuterol  (PROVENTIL ) (2.5 MG/3ML) 0.083% nebulizer solution Take 2.5 mg by nebulization every 6 (six) hours as needed for wheezing or shortness of breath.      albuterol  (VENTOLIN  HFA) 108 (90 Base) MCG/ACT inhaler Inhale 1-2 puffs into the lungs every 6 (six) hours as needed for wheezing or shortness of breath. 1 Inhaler 5   aspirin  81 MG chewable tablet Chew 1 tablet (81 mg total) by mouth daily. 90 tablet 3   atorvastatin  (LIPITOR) 80 MG tablet Take 1 tablet (80 mg total) by mouth daily. 90 tablet 3   clopidogrel  (PLAVIX ) 75 MG tablet Take 1 tablet (75 mg total) by mouth daily. 90 tablet 1   cyclobenzaprine (FLEXERIL) 10 MG tablet Take 10 mg by mouth every 8 (eight) hours as needed for muscle spasms.     dexamethasone  (DECADRON ) 4 MG tablet Take 2 tablets (8 mg total) by mouth daily. Start the day after chemotherapy for 2 days. Take with food. 30 tablet 1   furosemide  (LASIX ) 20 MG tablet Take 1 tablet (20 mg total) by mouth daily. 90 tablet 3   lidocaine -prilocaine  (EMLA ) cream Apply to affected area once 30 g 3   linaclotide  (LINZESS ) 145 MCG CAPS capsule Take 1 capsule (145 mcg total) by mouth daily before breakfast. 30 capsule 3   magic mouthwash (nystatin , lidocaine , diphenhydrAMINE, alum &  mag hydroxide) suspension Take 5 mLs by mouth every 4 (four) hours as needed for mouth pain. 500 mL 6   methylphenidate  (RITALIN ) 20 MG tablet Take 1 tablet (20 mg total) by mouth 3 (three) times daily. 180 tablet 0   metoprolol  succinate (TOPROL -XL) 25 MG 24 hr tablet Take 1 tablet (25 mg total) by mouth at bedtime.     nitroGLYCERIN  (NITROSTAT ) 0.4 MG SL tablet Place 1 tablet (0.4 mg total) under the tongue every 5 (five) minutes x 3 doses as needed for chest pain. 25 tablet 3   ondansetron  (ZOFRAN ) 8 MG tablet TAKE 1 TABLET BY MOUTH EVERY 8 HOURS AS NEEDED FOR NAUSEA OR VOMITING. START ON THE THIRD DAY AFTER CHEMOTHERAPY. 30 tablet 1   polyethylene glycol (MIRALAX) 17 g packet Take 17 g by mouth daily.     prochlorperazine  (COMPAZINE ) 10 MG tablet TAKE 1 TABLET BY MOUTH EVERY 6 HOURS AS NEEDED FOR NAUSEA OR VOMITING. 30 tablet 1   sacubitril -valsartan  (ENTRESTO ) 24-26 MG Take 1 tablet by mouth 2 (two) times daily. 180 tablet 3   spironolactone  (ALDACTONE )  25 MG tablet Take 0.5 tablets (12.5 mg total) by mouth daily. 45 tablet 3   SUMAtriptan  (IMITREX ) 100 MG tablet Take 100 mg by mouth every 2 (two) hours as needed for migraine.     tamsulosin (FLOMAX) 0.4 MG CAPS capsule Take 0.4-0.8 mg by mouth See admin instructions. Take 0.4mg  (1 capsule) by mouth daily for 2 weeks, then increase to 0.8mg  (2 capsules) daily,     traZODone  (DESYREL ) 150 MG tablet Take 0.5 tablets (75 mg total) by mouth at bedtime as needed for sleep.     VITAMIN D -VITAMIN K PO Take 1 capsule by mouth daily. 10,000IU/ 100 mcg     Wheat Dextrin (BENEFIBER PO) Take 1 tablet by mouth 3 (three) times daily as needed (Constipation per wating report).     methadone  (DOLOPHINE ) 10 MG tablet Take 1 tablet (10 mg total) by mouth in the morning, at noon, in the evening, and at bedtime. 120 tablet 0   No current facility-administered medications for this visit.   Facility-Administered Medications Ordered in Other Visits  Medication Dose  Route Frequency Provider Last Rate Last Admin   0.9 %  sodium chloride  infusion   Intravenous Continuous Stephenson Cichy, MD   Stopped at 06/18/24 1113   clindamycin  (CLEOCIN ) 900 mg in dextrose  5 % 50 mL IVPB  900 mg Intravenous 60 min Pre-Op Debby Hila, MD       And   gentamicin  (GARAMYCIN ) 5 mg/kg in dextrose  5 % 50 mL IVPB  5 mg/kg Intravenous 60 min Pre-Op Debby Hila, MD       dextrose  5 % solution   Intravenous Continuous Sophira Rumler, MD   Stopped at 06/18/24 1401   fluorouracil  (ADRUCIL ) 5,000 mg in sodium chloride  0.9 % 150 mL chemo infusion  2,400 mg/m2 (Treatment Plan Recorded) Intravenous 1 day or 1 dose Janilah Hojnacki, MD   Infusion Verify at 06/18/24 1451     VITALS:   Blood pressure 105/88, pulse (!) 103, temperature 98 F (36.7 C), temperature source Temporal, resp. rate 18, height 5' 9 (1.753 m), weight 204 lb 14.4 oz (92.9 kg), SpO2 98%.  Wt Readings from Last 3 Encounters:  06/18/24 204 lb 14.4 oz (92.9 kg)  06/04/24 213 lb 3.2 oz (96.7 kg)  05/28/24 207 lb 1.9 oz (93.9 kg)    Body mass index is 30.26 kg/m.    Onc Performance Status - 06/18/24 0927       ECOG Perf Status   ECOG Perf Status Ambulatory and capable of all selfcare but unable to carry out any work activities.  Up and about more than 50% of waking hours      KPS SCALE   KPS % SCORE Requires occasional assistance but is able to care for most needs             PHYSICAL EXAM:   Physical Exam Constitutional:      General: He is not in acute distress.    Appearance: Normal appearance.  HENT:     Head: Normocephalic and atraumatic.  Eyes:     Conjunctiva/sclera: Conjunctivae normal.  Cardiovascular:     Rate and Rhythm: Normal rate and regular rhythm.  Pulmonary:     Effort: Pulmonary effort is normal. No respiratory distress.  Abdominal:     General: There is no distension.  Neurological:     General: No focal deficit present.     Mental Status: He is alert and oriented to  person, place, and time.  Psychiatric:  Mood and Affect: Mood normal.        Behavior: Behavior normal.      LABORATORY DATA:   I have reviewed the data as listed.  Results for orders placed or performed in visit on 06/18/24  Total Protein, Urine dipstick  Result Value Ref Range   Protein, ur NEGATIVE NEGATIVE mg/dL  CMP (Cancer Center only)  Result Value Ref Range   Sodium 137 135 - 145 mmol/L   Potassium 3.5 3.5 - 5.1 mmol/L   Chloride 102 98 - 111 mmol/L   CO2 24 22 - 32 mmol/L   Glucose, Bld 87 70 - 99 mg/dL   BUN 8 8 - 23 mg/dL   Creatinine 8.97 9.38 - 1.24 mg/dL   Calcium  8.9 8.9 - 10.3 mg/dL   Total Protein 6.7 6.5 - 8.1 g/dL   Albumin 4.0 3.5 - 5.0 g/dL   AST 50 (H) 15 - 41 U/L   ALT 80 (H) 0 - 44 U/L   Alkaline Phosphatase 96 38 - 126 U/L   Total Bilirubin 1.1 0.0 - 1.2 mg/dL   GFR, Estimated >39 >39 mL/min   Anion gap 11 5 - 15  CBC with Differential (Cancer Center Only)  Result Value Ref Range   WBC Count 3.0 (L) 4.0 - 10.5 K/uL   RBC 4.53 4.22 - 5.81 MIL/uL   Hemoglobin 13.7 13.0 - 17.0 g/dL   HCT 61.4 (L) 60.9 - 47.9 %   MCV 85.0 80.0 - 100.0 fL   MCH 30.2 26.0 - 34.0 pg   MCHC 35.6 30.0 - 36.0 g/dL   RDW 79.3 (H) 88.4 - 84.4 %   Platelet Count 170 150 - 400 K/uL   nRBC 0.0 0.0 - 0.2 %   Neutrophils Relative % 43 %   Neutro Abs 1.3 (L) 1.7 - 7.7 K/uL   Lymphocytes Relative 37 %   Lymphs Abs 1.1 0.7 - 4.0 K/uL   Monocytes Relative 19 %   Monocytes Absolute 0.6 0.1 - 1.0 K/uL   Eosinophils Relative 0 %   Eosinophils Absolute 0.0 0.0 - 0.5 K/uL   Basophils Relative 0 %   Basophils Absolute 0.0 0.0 - 0.1 K/uL   Immature Granulocytes 1 %   Abs Immature Granulocytes 0.02 0.00 - 0.07 K/uL      RADIOGRAPHIC STUDIES:  CT CHEST ABDOMEN PELVIS W CONTRAST CLINICAL DATA:  Restaging Johnson IV rectal cancer. * Tracking Code: BO *  EXAM: CT CHEST, ABDOMEN, AND PELVIS WITH CONTRAST  TECHNIQUE: Multidetector CT imaging of the chest, abdomen and  pelvis was performed following the standard protocol during bolus administration of intravenous contrast.  RADIATION DOSE REDUCTION: This exam was performed according to the departmental dose-optimization program which includes automated exposure control, adjustment of the mA and/or kV according to patient size and/or use of iterative reconstruction technique.  CONTRAST:  OMNIPAQUE  IOHEXOL  300 MG/ML  SOLN  COMPARISON:  PET-CT February 23, 2024 and CT February 08, 2024  FINDINGS: CT CHEST FINDINGS  Cardiovascular: Accessed right chest Port-A-Cath with tip in the right atrium. Three-vessel coronary artery calcifications. Normal size heart. No significant pericardial effusion/thickening.  Mediastinum/Nodes: No suspicious thyroid  nodule. No pathologically enlarged mediastinal, hilar or axillary lymph nodes. The esophagus is grossly unremarkable.  Lungs/Pleura: Decreased size of the bilateral pulmonary nodules. For reference:  -right upper lobe pulmonary nodule on image 40/7 is now predominantly a thin walled cyst measuring 7 mm with a dominant soft tissue nodular focus along the lateral margin measuring 3  mm on image 40/7 previously this was a solid nodule measuring 19 x 17 mm.  -left anterior upper lobe pulmonary nodule on image 62/7 demonstrates a similar features with a cystic component measuring 8 mm and the dominant nodular component measuring 2 mm on image 62/7 previously this was a solid nodule measuring 13 x 12 mm.  No new suspicious pulmonary nodules or masses.  Musculoskeletal: No aggressive lytic or blastic lesion of bone. Intrathecal spinal stimulator lead.  CT ABDOMEN PELVIS FINDINGS  Hepatobiliary: New hypodense nodular focus in anterior segment V measuring 11 mm on image 65/2. Gallbladder surgically absent. No biliary ductal dilation.  Pancreas: No pancreatic ductal dilation or evidence of acute inflammation.  Spleen: No splenomegaly.  Adrenals/Urinary  Tract: No suspicious adrenal nodule/mass. No hydronephrosis. Kidneys demonstrate symmetric enhancement. Urinary bladder is unremarkable for degree of distension.  Stomach/Bowel: Stomach is distended with ingested material and gas without focal wall thickening. No pathologic dilation of small or large bowel. No evidence of acute bowel inflammation.  No discrete rectal mass or asymmetric wall thickening identified.  Vascular/Lymphatic: Normal caliber abdominal aorta. Smooth IVC contours. The portal, splenic and superior mesenteric veins are patent. No pathologically enlarged abdominal or pelvic lymph nodes.  Reproductive: Uterus and bilateral adnexa are unremarkable.  Other: No significant abdominopelvic free fluid.  Musculoskeletal: No aggressive lytic or blastic lesion of bone. Degenerative change of the bilateral hips.  IMPRESSION: 1. New hypodense nodular focus in anterior segment V measuring 11 mm, may reflect focal fatty sparing although a hepatic metastasis is a pertinent differential consideration. Suggest more definitive assessment by abdominal MRI with and without contrast. 2. Decreased size of the bilateral pulmonary nodules, consistent with treatment response. 3. No discrete rectal mass or asymmetric wall thickening identified.  Electronically Signed   By: Reyes Holder M.D.   On: 06/17/2024 08:04    CODE STATUS:  Code Status History     Date Active Date Inactive Code Status Order ID Comments User Context   11/27/2023 2206 11/29/2023 1747 Full Code [id number]  Cesario Chamorro, MD Inpatient    Questions for Most Recent Historical Code Status (Order [id number])     Question Answer   By: Consent: discussion documented in EHR            Orders Placed This Encounter  Procedures   CBC with Differential (Cancer Center Only)    Standing Status:   Future    Expected Date:   07/16/2024    Expiration Date:   07/16/2025   CMP (Cancer Center only)    Standing  Status:   Future    Expected Date:   07/16/2024    Expiration Date:   07/16/2025   Total Protein, Urine dipstick    Standing Status:   Future    Expected Date:   07/16/2024    Expiration Date:   07/16/2025     Future Appointments  Date Time Provider Department Center  06/20/2024 12:30 PM [facility] INFUSION [facility] None  07/02/2024  8:45 AM [facility] INFUSION [facility] None  07/02/2024  8:45 AM [facility] PHLEBOTOMIST [facility] None  07/02/2024  9:15 AM Caydyn Sprung, MD [facility] None  07/02/2024  9:45 AM [facility] INFUSION [facility] None  07/04/2024  1:45 PM [facility] INFUSION [facility] None  07/16/2024  8:45 AM [facility] INFUSION [facility] None  07/16/2024  8:45 AM [facility] PHLEBOTOMIST [facility] None  07/16/2024  9:15 AM Ivin Rosenbloom, MD [facility] None  07/16/2024  9:45 AM [facility] INFUSION [facility] None  07/18/2024  1:45 PM [facility] INFUSION  [facility] None  07/22/2024 10:30 AM [facility]  07/29/2024 11:10 AM Chandra Toribio POUR, MD [facility]    This document was completed utilizing Engineer, civil (consulting). Grammatical errors, random word insertions, pronoun errors, and incomplete sentences are an occasional consequence of this system due to software limitations, ambient noise, and hardware issues. Any formal questions or concerns about the content, text or information contained within the body of this dictation should be directly addressed to the provider for clarification.

## 2024-06-18 NOTE — Patient Instructions (Signed)

## 2024-06-20 ENCOUNTER — Inpatient Hospital Stay

## 2024-06-20 VITALS — BP 126/68 | HR 88 | Temp 97.9°F | Resp 18

## 2024-06-20 DIAGNOSIS — Z5111 Encounter for antineoplastic chemotherapy: Secondary | ICD-10-CM | POA: Diagnosis not present

## 2024-06-20 DIAGNOSIS — C20 Malignant neoplasm of rectum: Secondary | ICD-10-CM

## 2024-06-20 MED ORDER — PEGFILGRASTIM INJECTION 6 MG/0.6ML ~~LOC~~
6.0000 mg | PREFILLED_SYRINGE | Freq: Once | SUBCUTANEOUS | Status: AC
  Administered 2024-06-20: 6 mg via SUBCUTANEOUS
  Filled 2024-06-20: qty 0.6

## 2024-06-20 NOTE — Patient Instructions (Signed)

## 2024-06-24 ENCOUNTER — Telehealth: Payer: Self-pay

## 2024-06-24 NOTE — Telephone Encounter (Signed)
 Followed up on patient's concerns that he shared via Nurse Navigator at [facility].  Patient reported having diarrhea in between constipation. Also complained of discomfort to oral mucosa. Patient currently undergoing chemotherapy. He currently has Magic Mouthwash and uses warm water mixed with baking soda and salt along with Bioten. Emotional support offered to patient over his oral discomfort. Per Dr. Autumn, patient was advised to use Milk of Magnesia for his constipation and to follow the directions on the label.   Patient agreed to all instructions.

## 2024-07-01 ENCOUNTER — Inpatient Hospital Stay: Admitting: Nutrition

## 2024-07-01 MED ORDER — FLUOROURACIL CHEMO INJECTION 2.5 GM/50ML
400.0000 mg/m2 | Freq: Once | INTRAVENOUS | Status: DC
  Filled 2024-07-01: qty 18

## 2024-07-01 MED ORDER — SODIUM CHLORIDE 0.9 % IV SOLN
2400.0000 mg/m2 | INTRAVENOUS | Status: DC
  Administered 2024-07-02: 5000 mg via INTRAVENOUS
  Filled 2024-07-01: qty 100

## 2024-07-01 NOTE — Progress Notes (Signed)
 Nutrition follow up completed with patient over the telephone.  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 He was lost to follow up. He now has recurrence and is undergoing FOLFOX and Avastin .   Weight: 204 pounds October 14 211 pounds 8 oz September 16. 219 pounds 3.2 oz August 19. 220 pounds 4.8 oz May 27.   Labs reviewed.  I'm doing the best I can. Reports his mouth sores have improved and he is using 2 different mouth rinses which seem to be very effective. He did not think the MMW or baking soda and salt water helped a lot. He complains of increased pain in his bones/joints after Neulasta injection. Reports constipation improved and things are settling down. He is eating soft mushy foods and trying to drink ONS as often as he can. No questions for me today.   Nutrition diagnosis: Unintended weight loss continues.   Intervention: Strategies given to increased intake in the setting of mucositis. Educated to consume small frequent meals and snacks. Encouraged soft foods and ONS, especially with mucositis after treatment. Reviewed importance of increased water to improve constipation. Continue bowel regimen.  Monitoring, Evaluation, Goals: Tolerate adequate calories and protein to minimize weight loss.   Follow as needed.

## 2024-07-02 ENCOUNTER — Encounter: Payer: Self-pay | Admitting: Oncology

## 2024-07-02 ENCOUNTER — Inpatient Hospital Stay

## 2024-07-02 ENCOUNTER — Inpatient Hospital Stay (HOSPITAL_BASED_OUTPATIENT_CLINIC_OR_DEPARTMENT_OTHER): Admitting: Oncology

## 2024-07-02 VITALS — BP 125/86 | HR 100 | Temp 97.5°F | Resp 18 | Ht 69.0 in | Wt 209.9 lb

## 2024-07-02 VITALS — BP 120/83 | HR 64 | Temp 98.2°F | Resp 17

## 2024-07-02 DIAGNOSIS — G8929 Other chronic pain: Secondary | ICD-10-CM | POA: Diagnosis not present

## 2024-07-02 DIAGNOSIS — C20 Malignant neoplasm of rectum: Secondary | ICD-10-CM

## 2024-07-02 DIAGNOSIS — M792 Neuralgia and neuritis, unspecified: Secondary | ICD-10-CM

## 2024-07-02 DIAGNOSIS — Z5111 Encounter for antineoplastic chemotherapy: Secondary | ICD-10-CM | POA: Diagnosis not present

## 2024-07-02 DIAGNOSIS — G629 Polyneuropathy, unspecified: Secondary | ICD-10-CM

## 2024-07-02 LAB — CMP (CANCER CENTER ONLY)
ALT: 47 U/L — ABNORMAL HIGH (ref 0–44)
AST: 38 U/L (ref 15–41)
Albumin: 3.9 g/dL (ref 3.5–5.0)
Alkaline Phosphatase: 143 U/L — ABNORMAL HIGH (ref 38–126)
Anion gap: 11 (ref 5–15)
BUN: 13 mg/dL (ref 8–23)
CO2: 25 mmol/L (ref 22–32)
Calcium: 9.1 mg/dL (ref 8.9–10.3)
Chloride: 100 mmol/L (ref 98–111)
Creatinine: 1.03 mg/dL (ref 0.61–1.24)
GFR, Estimated: >60 mL/min (ref >60)
Glucose, Bld: 219 mg/dL — ABNORMAL HIGH (ref 70–99)
Potassium: 4.2 mmol/L (ref 3.5–5.1)
Sodium: 135 mmol/L (ref 135–145)
Total Bilirubin: 0.5 mg/dL (ref 0.0–1.2)
Total Protein: 6.7 g/dL (ref 6.5–8.1)

## 2024-07-02 LAB — CBC WITH DIFFERENTIAL (CANCER CENTER ONLY)
Band Neutrophils: 2 %
Basophils Absolute: 0 K/uL (ref 0.0–0.1)
Basophils Relative: 0 %
Eosinophils Absolute: 0 K/uL (ref 0.0–0.5)
Eosinophils Relative: 0 %
HCT: 36.5 % — ABNORMAL LOW (ref 39.0–52.0)
Hemoglobin: 12.6 g/dL — ABNORMAL LOW (ref 13.0–17.0)
Lymphocytes Relative: 3 %
Lymphs Abs: 0.5 K/uL — ABNORMAL LOW (ref 0.7–4.0)
MCH: 31.2 pg (ref 26.0–34.0)
MCHC: 34.5 g/dL (ref 30.0–36.0)
MCV: 90.3 fL (ref 80.0–100.0)
Monocytes Absolute: 0.2 K/uL (ref 0.1–1.0)
Monocytes Relative: 1 %
Neutro Abs: 15.9 K/uL — ABNORMAL HIGH (ref 1.7–7.7)
Neutrophils Relative %: 94 %
Platelet Count: 113 K/uL — ABNORMAL LOW (ref 150–400)
RBC: 4.04 MIL/uL — ABNORMAL LOW (ref 4.22–5.81)
RDW: 22.3 % — ABNORMAL HIGH (ref 11.5–15.5)
Smear Review: NORMAL
WBC Count: 16.6 K/uL — ABNORMAL HIGH (ref 4.0–10.5)
nRBC: 0.8 % — ABNORMAL HIGH (ref 0.0–0.2)

## 2024-07-02 LAB — TOTAL PROTEIN, URINE DIPSTICK: Protein, ur: NEGATIVE mg/dL

## 2024-07-02 MED ORDER — LEUCOVORIN CALCIUM INJECTION 350 MG
400.0000 mg/m2 | Freq: Once | INTRAVENOUS | Status: AC
  Administered 2024-07-02: 880 mg via INTRAVENOUS
  Filled 2024-07-02: qty 44

## 2024-07-02 MED ORDER — PALONOSETRON HCL INJECTION 0.25 MG/5ML
0.2500 mg | Freq: Once | INTRAVENOUS | Status: AC
  Administered 2024-07-02: 0.25 mg via INTRAVENOUS
  Filled 2024-07-02: qty 5

## 2024-07-02 MED ORDER — SODIUM CHLORIDE 0.9 % IV SOLN: INTRAVENOUS | Status: DC

## 2024-07-02 MED ORDER — SODIUM CHLORIDE 0.9 % IV SOLN
5.0000 mg/kg | Freq: Once | INTRAVENOUS | Status: AC
  Administered 2024-07-02: 500 mg via INTRAVENOUS
  Filled 2024-07-02: qty 16

## 2024-07-02 MED ORDER — OXALIPLATIN CHEMO INJECTION 100 MG/20ML
180.0000 mg | Freq: Once | INTRAVENOUS | Status: AC
  Administered 2024-07-02: 180 mg via INTRAVENOUS
  Filled 2024-07-02: qty 36

## 2024-07-02 MED ORDER — PREGABALIN 50 MG PO CAPS: 50.0000 mg | ORAL_CAPSULE | Freq: Every day | ORAL | 3 refills | Status: DC

## 2024-07-02 MED ORDER — DEXAMETHASONE SOD PHOSPHATE PF 10 MG/ML IJ SOLN
10.0000 mg | Freq: Once | INTRAMUSCULAR | Status: AC
  Administered 2024-07-02: 10 mg via INTRAVENOUS

## 2024-07-02 MED ORDER — DEXTROSE 5 % IV SOLN: INTRAVENOUS | Status: DC

## 2024-07-02 NOTE — Patient Instructions (Signed)
 [facility] A DEPT OF [facility]  Discharge Instructions: Thank you for choosing [facility] to provide your oncology and hematology care.   If you have a lab appointment with the Cancer Center, please go directly to the Cancer Center and check in at the registration area.   Wear comfortable clothing and clothing appropriate for easy access to any Portacath or PICC line.   We strive to give you quality time with your provider. You may need to reschedule your appointment if you arrive late (15 or more minutes).  Arriving late affects you and other patients whose appointments are after yours.  Also, if you miss three or more appointments without notifying the office, you may be dismissed from the clinic at the provider's discretion.      For prescription refill requests, have your pharmacy contact our office and allow 72 hours for refills to be completed.    Today you received the following chemotherapy and/or immunotherapy agents: bevacizumab, oxaliplatin, leucovorin, fluorouracil      To help prevent nausea and vomiting after your treatment, we encourage you to take your nausea medication as directed.  BELOW ARE SYMPTOMS THAT SHOULD BE REPORTED IMMEDIATELY: *FEVER GREATER THAN 100.4 F (38 C) OR HIGHER *CHILLS OR SWEATING *NAUSEA AND VOMITING THAT IS NOT CONTROLLED WITH YOUR NAUSEA MEDICATION *UNUSUAL SHORTNESS OF BREATH *UNUSUAL BRUISING OR BLEEDING *URINARY PROBLEMS (pain or burning when urinating, or frequent urination) *BOWEL PROBLEMS (unusual diarrhea, constipation, pain near the anus) TENDERNESS IN MOUTH AND THROAT WITH OR WITHOUT PRESENCE OF ULCERS (sore throat, sores in mouth, or a toothache) UNUSUAL RASH, SWELLING OR PAIN  UNUSUAL VAGINAL DISCHARGE OR ITCHING   Items with * indicate a potential emergency and should be followed up as soon as possible or go to the Emergency Department if any problems should occur.  Please show the  CHEMOTHERAPY ALERT CARD or IMMUNOTHERAPY ALERT CARD at check-in to the Emergency Department and triage nurse.  Should you have questions after your visit or need to cancel or reschedule your appointment, please contact [facility] - A DEPT OF [facility]  Dept: [phone]  and follow the prompts.  Office hours are 8:00 a.m. to 4:30 p.m. Monday - Friday. Please note that voicemails left after 4:00 p.m. may not be returned until the following business day.  We are closed weekends and major holidays. You have access to a nurse at all times for urgent questions. Please call the main number to the clinic Dept: [phone] and follow the prompts.   For any non-urgent questions, you may also contact your provider using MyChart. We now offer e-Visits for anyone 38 and older to request care online for non-urgent symptoms. For details visit [email or web address].   Also download the MyChart app! Go to the app store, search "MyChart", open the app, select [facility], and log in with your MyChart username and password.

## 2024-07-02 NOTE — Assessment & Plan Note (Signed)
 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  Previous gabapentin  use was ineffective and caused adverse effects. Lyrica (pregabalin) is proposed as an alternative, starting at a low dose with potential titration. Cymbalta is an option if Lyrica is ineffective. - Initiate Lyrica (pregabalin) at a low dose with titration as needed. - Consider Cymbalta if Lyrica is ineffective.  We will start cutting down oxaliplatin  dose if neuropathy continues to get worse.

## 2024-07-02 NOTE — Progress Notes (Signed)
 [facility]  ONCOLOGY CLINIC PROGRESS NOTE   Patient Care Team: Derrick Toribio POUR, MD as PCP - General (Family Medicine) Johnson, Derrick K, MD as PCP - Cardiology (Cardiology) Babs Derrick DASEN, MD as Consulting Physician (Physical Medicine and Rehabilitation) Debby Derrick CROME, NP as Nurse Practitioner (Physical Medicine and Rehabilitation) Lennard Derrick FALCON, MD as Consulting Physician (Gastroenterology)  PATIENT NAME: Derrick Johnson   MR#: [id number] DOB: 27-May-1956  Date of visit: 07/02/2024   ASSESSMENT & PLAN:   Derrick Johnson is a 68 y.o. gentleman with a past medical history of rectal cancer diagnosed in February 2021, treated with concurrent chemoradiation using Xeloda , completed treatment in April 2021, patient did not follow-up with surgery or with oncology departments after this. 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 He was referred back to our clinic by his PCP to reestablish care for his history of rectal cancer.  He has recurrence of disease with biopsy-proven lung mets.  Adenocarcinoma of rectum [facility]) Please review oncology history for additional details and timeline of events.  Patient was originally diagnosed with rectal cancer in 2021 and was treated with concurrent chemoradiation with Xeloda  with eventual plans for surgery.  He was lost to follow-up after that.  Restaging CT chest abdomen pelvis on 02/08/2024 suggested multiple lung nodules, concerning for metastatic recurrence.  On 02/27/2024, Derrick Johnson performed flexible video fiberoptic bronchoscopy with robotic assistance and biopsies of right upper lobe lung nodule.  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  Immunostains were positive for CK20 and CDX2, negative for CK7 and TTF-1, consistent with a colorectal  primary.  cTx,cNx,pM1a, Stage IV A disease.  MSI stable.  NGS testing on the specimen showed no actionable mutations.  All treatment options are palliative in nature, given stage IV disease.    He had an appointment on 03/15/2024 with cardiothoracic surgery Derrick Johnson for possible resection of the lung lesions:  he likely is out of his radiation window to undergo rectal cancer resection. If this cannot be resected there is minimal advantage to resect the pulmonary nodules   Palliative systemic treatment with FOLFOX started from 03/28/2024.  Avastin  was added from cycle 2 onwards.   Tolerating chemotherapy reasonably well overall with some expected side effects including mouth sores.  Symptoms controlled with supportive therapy including mouth rinses with baking soda and salt.  We prescribed Magic mouthwash previously.  Following completion of 6 cycles of FOLFOX plus Avastin , restaging CT chest, abdomen and pelvis on 06/14/2024 showed significant improvement in lung nodules.  Nonspecific 11 mm hypointense nodular focus in the anterior segment 5 of the liver may reflect focal fatty sparing, although hepatic metastasis could not be ruled out.  Patient cannot undergo MRIs.  Hence plan to monitor this.  Plan to continue current management.  Due for cycle 8 of chemotherapy today.  Labs reveal leukocytosis, likely from recent Neulasta injection after cycle 7.  No dose-limiting toxicities. Because of progressive mucositis, skin changes, mouth sores, we will start skipping 5-FU bolus dose from cycle 8 onwards (07/02/2024).  If progressive neuropathy is noted, we will start cutting down oxaliplatin  dose.  We will proceed with standard dose chemotherapy except 5-FU bolus dose today.  Will also skip Neulasta with current cycle..    Plan to continue current regimen and repeat imaging in approximately 3 months from the last  scan.   Chronic neuropathic pain He had pre-existing neuropathy which is slowly  getting worse.  Previous gabapentin  use was ineffective and caused adverse effects. Lyrica (pregabalin) is proposed as an alternative, starting at a low dose with potential titration. Cymbalta is an option if Lyrica is ineffective. - Initiate Lyrica (pregabalin) at a low dose with titration as needed. - Consider Cymbalta if Lyrica is ineffective.  We will start cutting down oxaliplatin  dose if neuropathy continues to get worse.  Chemotherapy-induced oral mucositis Persistent oral mucositis with sores and swelling, affecting ability to wear dentures and eat. Magic Mouthwash ineffective. Salt water and baking soda rinse equally effective. - Continue using salt water and baking soda rinse for oral mucositis. - We started skipping 5-FU bolus from 07/02/2024, cycle 8 onwards  History of myocardial infarction Previous myocardial infarction noted. Discussion of potential heart risks with chemotherapy, but no current contraindications for treatment. - Monitor for any cardiac symptoms during chemotherapy  Chronic Migraine Chronic migraines with severe episodes lasting three to four days a week. - CT head on 03/13/2024 showed no evidence of metastatic disease.  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   I reviewed lab results and outside records for this visit and discussed relevant results with the patient. Diagnosis, plan of care and treatment options were also discussed in detail with the patient. Opportunity provided to ask questions and answers provided to his apparent satisfaction. Provided instructions to call our clinic with any problems, questions or concerns prior to return visit. I recommended to continue follow-up with PCP and sub-specialists. He verbalized understanding and agreed with the plan.   NCCN guidelines have been consulted in the planning of this patient's care.  I spent a total of 42 minutes during this encounter with the patient including review of chart  and various tests results, discussions about plan of care and coordination of care plan.   Derrick Patten, MD  07/02/2024 4:41 PM  [facility] - A DEPT OF [name] [address] [place] [address] Dept: [phone] Dept Fax: [phone]    CHIEF COMPLAINT/ REASON FOR VISIT:   Rectal cancer, initially diagnosed and treated in 2021 with concurrent chemoradiation using Xeloda .  Lost to follow-up.  Now with metastatic recurrence in the lungs, biopsy-proven in June 2025.  Current Treatment: Palliative systemic treatment with FOLFOX started from 03/28/2024.  Avastin  was added from cycle 2 onwards.   INTERVAL HISTORY:    Discussed the use of AI scribe software for clinical note transcription with the patient, who gave verbal consent to proceed.  History of Present Illness Derrick Johnson is a 68 year old male who presents for management of chemotherapy-induced side effects.  He experiences ongoing gastrointestinal symptoms, including alternating constipation and diarrhea. Constipation is managed with Benefiber and Linzess , though Linzess  alone led to constipation. Imodium has been added to his regimen, helping to balance symptoms, though loose stools persist. These symptoms have been present for decades.  Nausea is frequent, attributed to a lifelong history of migraines, but there is no vomiting. Dizziness is present but improving, though he needs to steady himself when standing quickly.  Neuropathy is significant, with persistent numbness and difficulty with fine motor tasks, such as opening binder clips. Gloves are used to assist with these tasks. He previously took gabapentin  at 1200 mg daily for pain and migraines, but found it ineffective and unpleasant.  Mouth sores are a persistent problem,  though improved with Canker-X and other rinses. Despite these treatments, sores continue to occur and are  aggravating.  He experiences leg and hip pain and has a history of sciatica, with the pain described as aching. There is a history of sciatica, and the pain is described as aching. Flexeril is used as needed for muscle pain, though not taken daily.   I have reviewed the past medical history, past surgical history, social history and family history with the patient and they are unchanged from previous note.  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  2 small perirectal nodes measuring 3 to 4 mm   Lower EUS 11/13/2019-tumor in the proximal rectum.  Proximal posterolateral rectal mass.  2 abnormal lymph nodes in the perirectal region.  Staged T3N1 by endorectal ultrasound.    Previously he was seen by Dr. Cloretta in our clinic.   Xeloda /radiation 11/25/2019-01/01/2020   He was supposed to undergo surgical resection under the direction of Dr. Debby in June 2021.  Patient apparently had dental infections around that time and needed dental extractions and patient deferred surgery and did not seek follow-up care either with the surgeons or with our department.  He also did not have follow-up appointments with gastroenterology.   His PCP referred him back to his and he reestablished care with us  on 01/30/2024.  CEA was normal at 1.68.  CBCD and CMP were unremarkable.  Plan made for restaging CT scan of the chest, abdomen and pelvis and referral sent to GI for endoscopic evaluation.  Patient cannot get MRIs because of nerve stimulator in place.   On 02/08/2024, restaging CT chest, abdomen and pelvis showed multiple lung nodules, largest 1 measuring 1.9 cm, concerning for pulmonary metastatic disease.  Previously seen circumferential superior  rectal mass was no longer distinctly visible.  No evidence of lymphadenopathy or metastatic disease in the abdomen or pelvis.   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  No evidence of metastatic adenopathy in the chest or evidence of other metastatic disease in the abdomen or pelvis.  On 02/27/2024, Derrick Johnson performed flexible video fiberoptic bronchoscopy with robotic assistance and biopsies of right upper lobe lung nodule.  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  Immunostains were positive for CK20 and CDX2, negative for CK7 and TTF-1, consistent with a colorectal primary.  cTx,cNx,pM1a, Stage IV A disease.   All treatment options are palliative in nature, given stage IV disease.    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  MSI stable.  Overall no actionable mutations.  He had an appointment on 03/15/2024 with cardiothoracic surgery Derrick Johnson for possible resection of the lung lesions:  he likely is out of his radiation window to undergo rectal cancer resection. If this cannot be resected there is minimal advantage to resect the pulmonary nodules   Palliative systemic treatment with FOLFOX started from 03/28/2024.  Avastin  was added from cycle 2 onwards.   Following completion of 6 cycles of FOLFOX plus Avastin , restaging CT chest, abdomen and pelvis on 06/14/2024 showed significant improvement in lung nodules.  Nonspecific 11 mm hypointense nodular focus in the anterior segment 5 of the  liver may reflect focal fatty sparing, although hepatic metastasis could not be ruled out.  Patient cannot undergo MRIs.  Hence plan to monitor this.  Plan to continue current management.  Because of progressive mucositis, skin changes, mouth sores, we started skipping 5-FU bolus dose from cycle 8 onwards  (07/02/2024).  If progressive neuropathy is noted, we will start cutting down oxaliplatin  dose.  Oncology History  Adenocarcinoma of rectum (HCC)  11/07/2019 Initial Diagnosis   Adenocarcinoma of rectum (HCC)   03/12/2024 Cancer Staging   Staging form: Colon and Rectum, AJCC 8th Edition - Clinical stage from 03/12/2024: Stage IVA (rcTX, rcNX, rpM1a) - Signed by Autumn Millman, MD on 03/12/2024 Stage prefix: Recurrence   03/28/2024 -  Chemotherapy   Patient is on Treatment Plan : COLORECTAL FOLFOX + Bevacizumab  q14d         REVIEW OF SYSTEMS:   Review of Systems - Oncology  All other pertinent systems were reviewed with the patient and are negative.  ALLERGIES: He is allergic to latex.  MEDICATIONS:  Current Outpatient Medications  Medication Sig Dispense Refill   albuterol  (PROVENTIL ) (2.5 MG/3ML) 0.083% nebulizer solution Take 2.5 mg by nebulization every 6 (six) hours as needed for wheezing or shortness of breath.      albuterol  (VENTOLIN  HFA) 108 (90 Base) MCG/ACT inhaler Inhale 1-2 puffs into the lungs every 6 (six) hours as needed for wheezing or shortness of breath. 1 Inhaler 5   aspirin  81 MG chewable tablet Chew 1 tablet (81 mg total) by mouth daily. 90 tablet 3   atorvastatin  (LIPITOR) 80 MG tablet Take 1 tablet (80 mg total) by mouth daily. 90 tablet 3   clopidogrel  (PLAVIX ) 75 MG tablet Take 1 tablet (75 mg total) by mouth daily. 90 tablet 1   cyclobenzaprine (FLEXERIL) 10 MG tablet Take 10 mg by mouth every 8 (eight) hours as needed for muscle spasms.     dexamethasone  (DECADRON ) 4 MG tablet Take 2 tablets (8 mg total) by mouth daily. Start the day after chemotherapy for 2 days. Take with food. 30 tablet 1   furosemide  (LASIX ) 20 MG tablet Take 1 tablet (20 mg total) by mouth daily. 90 tablet 3   lidocaine -prilocaine  (EMLA ) cream Apply to affected area once 30 g 3   linaclotide  (LINZESS ) 145 MCG CAPS capsule Take 1 capsule (145 mcg total) by mouth daily before breakfast. 30  capsule 3   magic mouthwash (nystatin , lidocaine , diphenhydrAMINE, alum & mag hydroxide) suspension Take 5 mLs by mouth every 4 (four) hours as needed for mouth pain. 500 mL 6   methadone  (DOLOPHINE ) 10 MG tablet Take 1 tablet (10 mg total) by mouth in the morning, at noon, in the evening, and at bedtime. 120 tablet 0   methylphenidate  (RITALIN ) 20 MG tablet Take 1 tablet (20 mg total) by mouth 3 (three) times daily. 180 tablet 0   metoprolol  succinate (TOPROL -XL) 25 MG 24 hr tablet Take 1 tablet (25 mg total) by mouth at bedtime.     nitroGLYCERIN  (NITROSTAT ) 0.4 MG SL tablet Place 1 tablet (0.4 mg total) under the tongue every 5 (five) minutes x 3 doses as needed for chest pain. 25 tablet 3   ondansetron  (ZOFRAN ) 8 MG tablet TAKE 1 TABLET BY MOUTH EVERY 8 HOURS AS NEEDED FOR NAUSEA OR VOMITING. START ON THE THIRD DAY AFTER CHEMOTHERAPY. 30 tablet 1   polyethylene glycol (MIRALAX) 17 g packet Take 17 g by mouth daily.     pregabalin (LYRICA) 50 MG capsule  Take 1 capsule (50 mg total) by mouth daily. 30 capsule 3   prochlorperazine  (COMPAZINE ) 10 MG tablet TAKE 1 TABLET BY MOUTH EVERY 6 HOURS AS NEEDED FOR NAUSEA OR VOMITING. 30 tablet 1   sacubitril -valsartan  (ENTRESTO ) 24-26 MG Take 1 tablet by mouth 2 (two) times daily. 180 tablet 3   spironolactone  (ALDACTONE ) 25 MG tablet Take 0.5 tablets (12.5 mg total) by mouth daily. 45 tablet 3   SUMAtriptan  (IMITREX ) 100 MG tablet Take 100 mg by mouth every 2 (two) hours as needed for migraine.     tamsulosin (FLOMAX) 0.4 MG CAPS capsule Take 0.4-0.8 mg by mouth See admin instructions. Take 0.4mg  (1 capsule) by mouth daily for 2 weeks, then increase to 0.8mg  (2 capsules) daily,     traZODone  (DESYREL ) 150 MG tablet Take 0.5 tablets (75 mg total) by mouth at bedtime as needed for sleep.     VITAMIN D -VITAMIN K PO Take 1 capsule by mouth daily. 10,000IU/ 100 mcg     Wheat Dextrin (BENEFIBER PO) Take 1 tablet by mouth 3 (three) times daily as needed  (Constipation per wating report).     No current facility-administered medications for this visit.   Facility-Administered Medications Ordered in Other Visits  Medication Dose Route Frequency Provider Last Rate Last Admin   0.9 %  sodium chloride  infusion   Intravenous Continuous Sarai January, MD   Stopped at 07/02/24 1054   clindamycin  (CLEOCIN ) 900 mg in dextrose  5 % 50 mL IVPB  900 mg Intravenous 60 min Pre-Op Debby Hila, MD       And   gentamicin  (GARAMYCIN ) 5 mg/kg in dextrose  5 % 50 mL IVPB  5 mg/kg Intravenous 60 min Pre-Op Debby Hila, MD       dextrose  5 % solution   Intravenous Continuous Carston Riedl, MD   Stopped at 07/02/24 1317   fluorouracil  (ADRUCIL ) 5,000 mg in sodium chloride  0.9 % 150 mL chemo infusion  2,400 mg/m2 (Treatment Plan Recorded) Intravenous 1 day or 1 dose Tobey Lippard, MD   Infusion Verify at 07/02/24 1348     VITALS:   Blood pressure 125/86, pulse 100, temperature (!) 97.5 F (36.4 C), temperature source Temporal, resp. rate 18, height 5' 9 (1.753 m), weight 209 lb 14.4 oz (95.2 kg), SpO2 96%.  Wt Readings from Last 3 Encounters:  07/02/24 209 lb 14.4 oz (95.2 kg)  06/18/24 204 lb 14.4 oz (92.9 kg)  06/04/24 213 lb 3.2 oz (96.7 kg)    Body mass index is 31 kg/m.    Onc Performance Status - 07/02/24 0900       ECOG Perf Status   ECOG Perf Status Ambulatory and capable of all selfcare but unable to carry out any work activities.  Up and about more than 50% of waking hours (P)       KPS SCALE   KPS % SCORE Cares for self, unable to carry on normal activity or to do active work (P)               PHYSICAL EXAM:   Physical Exam Constitutional:      General: He is not in acute distress.    Appearance: Normal appearance.  HENT:     Head: Normocephalic and atraumatic.  Eyes:     Conjunctiva/sclera: Conjunctivae normal.  Cardiovascular:     Rate and Rhythm: Normal rate and regular rhythm.  Pulmonary:     Effort: Pulmonary  effort is normal. No respiratory distress.  Abdominal:  General: There is no distension.  Neurological:     General: No focal deficit present.     Mental Status: He is alert and oriented to person, place, and time.  Psychiatric:        Mood and Affect: Mood normal.        Behavior: Behavior normal.      LABORATORY DATA:   I have reviewed the data as listed.  Results for orders placed or performed in visit on 07/02/24  Total Protein, Urine dipstick  Result Value Ref Range   Protein, ur NEGATIVE NEGATIVE mg/dL  CMP (Cancer Center only)  Result Value Ref Range   Sodium 135 135 - 145 mmol/L   Potassium 4.2 3.5 - 5.1 mmol/L   Chloride 100 98 - 111 mmol/L   CO2 25 22 - 32 mmol/L   Glucose, Bld 219 (H) 70 - 99 mg/dL   BUN 13 8 - 23 mg/dL   Creatinine 8.96 9.38 - 1.24 mg/dL   Calcium  9.1 8.9 - 10.3 mg/dL   Total Protein 6.7 6.5 - 8.1 g/dL   Albumin 3.9 3.5 - 5.0 g/dL   AST 38 15 - 41 U/L   ALT 47 (H) 0 - 44 U/L   Alkaline Phosphatase 143 (H) 38 - 126 U/L   Total Bilirubin 0.5 0.0 - 1.2 mg/dL   GFR, Estimated >39 >39 mL/min   Anion gap 11 5 - 15  CBC with Differential (Cancer Center Only)  Result Value Ref Range   WBC Count 16.6 (H) 4.0 - 10.5 K/uL   RBC 4.04 (L) 4.22 - 5.81 MIL/uL   Hemoglobin 12.6 (L) 13.0 - 17.0 g/dL   HCT 63.4 (L) 60.9 - 47.9 %   MCV 90.3 80.0 - 100.0 fL   MCH 31.2 26.0 - 34.0 pg   MCHC 34.5 30.0 - 36.0 g/dL   RDW 77.6 (H) 88.4 - 84.4 %   Platelet Count 113 (L) 150 - 400 K/uL   nRBC 0.8 (H) 0.0 - 0.2 %   Neutrophils Relative % 94 %   Neutro Abs 15.9 (H) 1.7 - 7.7 K/uL   Band Neutrophils 2 %   Lymphocytes Relative 3 %   Lymphs Abs 0.5 (L) 0.7 - 4.0 K/uL   Monocytes Relative 1 %   Monocytes Absolute 0.2 0.1 - 1.0 K/uL   Eosinophils Relative 0 %   Eosinophils Absolute 0.0 0.0 - 0.5 K/uL   Basophils Relative 0 %   Basophils Absolute 0.0 0.0 - 0.1 K/uL   RBC Morphology MORPHOLOGY UNREMARKABLE    Smear Review Normal platelet morphology         RADIOGRAPHIC STUDIES:  CT CHEST ABDOMEN PELVIS W CONTRAST CLINICAL DATA:  Restaging stage IV rectal cancer. * Tracking Code: BO *  EXAM: CT CHEST, ABDOMEN, AND PELVIS WITH CONTRAST  TECHNIQUE: Multidetector CT imaging of the chest, abdomen and pelvis was performed following the standard protocol during bolus administration of intravenous contrast.  RADIATION DOSE REDUCTION: This exam was performed according to the departmental dose-optimization program which includes automated exposure control, adjustment of the mA and/or kV according to patient size and/or use of iterative reconstruction technique.  CONTRAST:  OMNIPAQUE  IOHEXOL  300 MG/ML  SOLN  COMPARISON:  PET-CT February 23, 2024 and CT February 08, 2024  FINDINGS: CT CHEST FINDINGS  Cardiovascular: Accessed right chest Port-A-Cath with tip in the right atrium. Three-vessel coronary artery calcifications. Normal size heart. No significant pericardial effusion/thickening.  Mediastinum/Nodes: No suspicious thyroid  nodule. No pathologically enlarged mediastinal,  hilar or axillary lymph nodes. The esophagus is grossly unremarkable.  Lungs/Pleura: Decreased size of the bilateral pulmonary nodules. For reference:  -right upper lobe pulmonary nodule on image 40/7 is now predominantly a thin walled cyst measuring 7 mm with a dominant soft tissue nodular focus along the lateral margin measuring 3 mm on image 40/7 previously this was a solid nodule measuring 19 x 17 mm.  -left anterior upper lobe pulmonary nodule on image 62/7 demonstrates a similar features with a cystic component measuring 8 mm and the dominant nodular component measuring 2 mm on image 62/7 previously this was a solid nodule measuring 13 x 12 mm.  No new suspicious pulmonary nodules or masses.  Musculoskeletal: No aggressive lytic or blastic lesion of bone. Intrathecal spinal stimulator lead.  CT ABDOMEN PELVIS FINDINGS  Hepatobiliary: New  hypodense nodular focus in anterior segment V measuring 11 mm on image 65/2. Gallbladder surgically absent. No biliary ductal dilation.  Pancreas: No pancreatic ductal dilation or evidence of acute inflammation.  Spleen: No splenomegaly.  Adrenals/Urinary Tract: No suspicious adrenal nodule/mass. No hydronephrosis. Kidneys demonstrate symmetric enhancement. Urinary bladder is unremarkable for degree of distension.  Stomach/Bowel: Stomach is distended with ingested material and gas without focal wall thickening. No pathologic dilation of small or large bowel. No evidence of acute bowel inflammation.  No discrete rectal mass or asymmetric wall thickening identified.  Vascular/Lymphatic: Normal caliber abdominal aorta. Smooth IVC contours. The portal, splenic and superior mesenteric veins are patent. No pathologically enlarged abdominal or pelvic lymph nodes.  Reproductive: Uterus and bilateral adnexa are unremarkable.  Other: No significant abdominopelvic free fluid.  Musculoskeletal: No aggressive lytic or blastic lesion of bone. Degenerative change of the bilateral hips.  IMPRESSION: 1. New hypodense nodular focus in anterior segment V measuring 11 mm, may reflect focal fatty sparing although a hepatic metastasis is a pertinent differential consideration. Suggest more definitive assessment by abdominal MRI with and without contrast. 2. Decreased size of the bilateral pulmonary nodules, consistent with treatment response. 3. No discrete rectal mass or asymmetric wall thickening identified.  Electronically Signed   By: Reyes Holder M.D.   On: 06/17/2024 08:04    CODE STATUS:  Code Status History     Date Active Date Inactive Code Status Order ID Comments User Context   11/27/2023 2206 11/29/2023 1747 Full Code [id number]  Cesario Chamorro, MD Inpatient    Questions for Most Recent Historical Code Status (Order [id number])     Question Answer   By: Consent: discussion  documented in EHR            No orders of the defined types were placed in this encounter.    Future Appointments  Date Time Provider Department Center  07/04/2024 11:30 AM [facility] INFUSION [facility] None  07/16/2024  8:45 AM [facility] INFUSION [facility] None  07/16/2024  8:45 AM [facility] PHLEBOTOMIST [facility] None  07/16/2024  9:15 AM Bladyn Tipps, MD [facility] None  07/16/2024  9:45 AM [facility] INFUSION [facility] None  07/18/2024  1:45 PM [facility] INFUS[facility] None  07/22/2024 10:30 AM [facility]  07/29/2024 11:10 AM Derrick Toribio POUR, MD [facility]    This document was completed utilizing engineer, civil (consulting). Grammatical errors, random word insertions, pronoun errors, and incomplete sentences are an occasional consequence of this system due to software limitations, ambient noise, and hardware issues. Any formal questions or concerns about the content, text or information contained within the body of this dictation should  be directly addressed to the provider for clarification.

## 2024-07-02 NOTE — Assessment & Plan Note (Addendum)
 Please review oncology history for additional details and timeline of events.  Patient was originally diagnosed with rectal cancer in 2021 and was treated with concurrent chemoradiation with Xeloda  with eventual plans for surgery.  He was lost to follow-up after that.  Restaging CT chest abdomen pelvis on 02/08/2024 suggested multiple lung nodules, concerning for metastatic recurrence.  On 02/27/2024, Dr. Shelah performed flexible video fiberoptic bronchoscopy with robotic assistance and biopsies of right upper lobe lung nodule.  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  Immunostains were positive for CK20 and CDX2, negative for CK7 and TTF-1, consistent with a colorectal primary.  cTx,cNx,pM1a, Stage IV A disease.  MSI stable.  NGS testing on the specimen showed no actionable mutations.  All treatment options are palliative in nature, given stage IV disease.    He had an appointment on 03/15/2024 with cardiothoracic surgery Dr. Shyrl for possible resection of the lung lesions:  he likely is out of his radiation window to undergo rectal cancer resection. If this cannot be resected there is minimal advantage to resect the pulmonary nodules   Palliative systemic treatment with FOLFOX started from 03/28/2024.  Avastin  was added from cycle 2 onwards.   Tolerating chemotherapy reasonably well overall with some expected side effects including mouth sores.  Symptoms controlled with supportive therapy including mouth rinses with baking soda and salt.  We prescribed Magic mouthwash previously.  Following completion of 6 cycles of FOLFOX plus Avastin , restaging CT chest, abdomen and pelvis on 06/14/2024 showed significant improvement in lung nodules.  Nonspecific 11 mm hypointense nodular focus in the anterior segment 5 of the liver may reflect focal fatty sparing, although hepatic metastasis could not be ruled out.  Patient cannot undergo MRIs.  Hence plan to monitor this.  Plan to continue current  management.  Due for cycle 8 of chemotherapy today.  Labs reveal leukocytosis, likely from recent Neulasta injection after cycle 7.  No dose-limiting toxicities. Because of progressive mucositis, skin changes, mouth sores, we will start skipping 5-FU bolus dose from cycle 8 onwards (07/02/2024).  If progressive neuropathy is noted, we will start cutting down oxaliplatin  dose.  We will proceed with standard dose chemotherapy except 5-FU bolus dose today.  Will also skip Neulasta with current cycle..    Plan to continue current regimen and repeat imaging in approximately 3 months from the last scan.

## 2024-07-02 NOTE — Progress Notes (Signed)
 Patient seen by Dr. Chinita Pasam today  Vitals are within treatment parameters:Yes   Labs are within treatment parameters: ANC still pending, CBC and CMP results within parameters    Treatment plan has been signed: Yes   Per physician team, Patient is ready for treatment. Please note the following modifications: 5FU bolus dose removed today.

## 2024-07-04 ENCOUNTER — Ambulatory Visit

## 2024-07-04 VITALS — BP 129/66 | HR 86 | Temp 97.9°F | Resp 18

## 2024-07-04 DIAGNOSIS — C20 Malignant neoplasm of rectum: Secondary | ICD-10-CM

## 2024-07-04 NOTE — Patient Instructions (Signed)

## 2024-07-08 ENCOUNTER — Other Ambulatory Visit: Payer: Self-pay | Admitting: Family Medicine

## 2024-07-08 ENCOUNTER — Other Ambulatory Visit: Payer: Self-pay

## 2024-07-15 ENCOUNTER — Other Ambulatory Visit: Payer: Self-pay | Admitting: Nurse Practitioner

## 2024-07-15 ENCOUNTER — Other Ambulatory Visit: Payer: Self-pay | Admitting: Oncology

## 2024-07-15 DIAGNOSIS — C20 Malignant neoplasm of rectum: Secondary | ICD-10-CM

## 2024-07-15 DIAGNOSIS — L089 Local infection of the skin and subcutaneous tissue, unspecified: Secondary | ICD-10-CM

## 2024-07-15 NOTE — Telephone Encounter (Signed)
 He is being seen at [facility] tomorrow and has infusion. Will refill then. Andrea CHRISTELLA Plunk, RN

## 2024-07-16 ENCOUNTER — Inpatient Hospital Stay: Attending: Oncology | Admitting: Oncology

## 2024-07-16 ENCOUNTER — Inpatient Hospital Stay: Payer: Self-pay

## 2024-07-16 ENCOUNTER — Inpatient Hospital Stay

## 2024-07-16 ENCOUNTER — Other Ambulatory Visit (HOSPITAL_BASED_OUTPATIENT_CLINIC_OR_DEPARTMENT_OTHER): Payer: Self-pay

## 2024-07-16 ENCOUNTER — Encounter: Payer: Self-pay | Admitting: Oncology

## 2024-07-16 ENCOUNTER — Other Ambulatory Visit: Payer: Self-pay

## 2024-07-16 ENCOUNTER — Telehealth: Payer: Self-pay

## 2024-07-16 ENCOUNTER — Inpatient Hospital Stay: Attending: Oncology

## 2024-07-16 VITALS — BP 121/84 | HR 97 | Temp 98.6°F | Resp 16 | Wt 219.6 lb

## 2024-07-16 VITALS — BP 104/68 | HR 92 | Temp 98.3°F | Resp 18

## 2024-07-16 DIAGNOSIS — G894 Chronic pain syndrome: Secondary | ICD-10-CM

## 2024-07-16 DIAGNOSIS — K1230 Oral mucositis (ulcerative), unspecified: Secondary | ICD-10-CM | POA: Diagnosis not present

## 2024-07-16 DIAGNOSIS — Z5111 Encounter for antineoplastic chemotherapy: Secondary | ICD-10-CM | POA: Diagnosis present

## 2024-07-16 DIAGNOSIS — Z79899 Other long term (current) drug therapy: Secondary | ICD-10-CM | POA: Insufficient documentation

## 2024-07-16 DIAGNOSIS — M792 Neuralgia and neuritis, unspecified: Secondary | ICD-10-CM | POA: Diagnosis not present

## 2024-07-16 DIAGNOSIS — D6481 Anemia due to antineoplastic chemotherapy: Secondary | ICD-10-CM | POA: Insufficient documentation

## 2024-07-16 DIAGNOSIS — C20 Malignant neoplasm of rectum: Secondary | ICD-10-CM | POA: Diagnosis not present

## 2024-07-16 DIAGNOSIS — Z923 Personal history of irradiation: Secondary | ICD-10-CM | POA: Diagnosis not present

## 2024-07-16 DIAGNOSIS — F988 Other specified behavioral and emotional disorders with onset usually occurring in childhood and adolescence: Secondary | ICD-10-CM | POA: Diagnosis not present

## 2024-07-16 DIAGNOSIS — M797 Fibromyalgia: Secondary | ICD-10-CM

## 2024-07-16 DIAGNOSIS — K1379 Other lesions of oral mucosa: Secondary | ICD-10-CM | POA: Insufficient documentation

## 2024-07-16 DIAGNOSIS — I2510 Atherosclerotic heart disease of native coronary artery without angina pectoris: Secondary | ICD-10-CM | POA: Insufficient documentation

## 2024-07-16 DIAGNOSIS — Z79891 Long term (current) use of opiate analgesic: Secondary | ICD-10-CM | POA: Insufficient documentation

## 2024-07-16 DIAGNOSIS — Z7902 Long term (current) use of antithrombotics/antiplatelets: Secondary | ICD-10-CM | POA: Diagnosis not present

## 2024-07-16 DIAGNOSIS — R197 Diarrhea, unspecified: Secondary | ICD-10-CM | POA: Diagnosis not present

## 2024-07-16 DIAGNOSIS — K5900 Constipation, unspecified: Secondary | ICD-10-CM | POA: Diagnosis not present

## 2024-07-16 DIAGNOSIS — C7801 Secondary malignant neoplasm of right lung: Secondary | ICD-10-CM | POA: Diagnosis not present

## 2024-07-16 DIAGNOSIS — I252 Old myocardial infarction: Secondary | ICD-10-CM | POA: Insufficient documentation

## 2024-07-16 DIAGNOSIS — Z5181 Encounter for therapeutic drug level monitoring: Secondary | ICD-10-CM | POA: Diagnosis not present

## 2024-07-16 DIAGNOSIS — G8929 Other chronic pain: Secondary | ICD-10-CM

## 2024-07-16 DIAGNOSIS — G950 Syringomyelia and syringobulbia: Secondary | ICD-10-CM

## 2024-07-16 DIAGNOSIS — Z7952 Long term (current) use of systemic steroids: Secondary | ICD-10-CM | POA: Insufficient documentation

## 2024-07-16 DIAGNOSIS — Z7982 Long term (current) use of aspirin: Secondary | ICD-10-CM | POA: Diagnosis not present

## 2024-07-16 DIAGNOSIS — Z9221 Personal history of antineoplastic chemotherapy: Secondary | ICD-10-CM | POA: Diagnosis not present

## 2024-07-16 DIAGNOSIS — T451X5A Adverse effect of antineoplastic and immunosuppressive drugs, initial encounter: Secondary | ICD-10-CM | POA: Insufficient documentation

## 2024-07-16 LAB — CMP (CANCER CENTER ONLY)
ALT: 28 U/L (ref 0–44)
AST: 37 U/L (ref 15–41)
Albumin: 4 g/dL (ref 3.5–5.0)
Alkaline Phosphatase: 97 U/L (ref 38–126)
Anion gap: 12 (ref 5–15)
BUN: 25 mg/dL — ABNORMAL HIGH (ref 8–23)
CO2: 22 mmol/L (ref 22–32)
Calcium: 8.9 mg/dL (ref 8.9–10.3)
Chloride: 100 mmol/L (ref 98–111)
Creatinine: 1 mg/dL (ref 0.61–1.24)
GFR, Estimated: >60 mL/min (ref >60)
Glucose, Bld: 178 mg/dL — ABNORMAL HIGH (ref 70–99)
Potassium: 4.6 mmol/L (ref 3.5–5.1)
Sodium: 134 mmol/L — ABNORMAL LOW (ref 135–145)
Total Bilirubin: 0.6 mg/dL (ref 0.0–1.2)
Total Protein: 6.7 g/dL (ref 6.5–8.1)

## 2024-07-16 LAB — CBC WITH DIFFERENTIAL (CANCER CENTER ONLY)
Abs Immature Granulocytes: 0.04 K/uL (ref 0.00–0.07)
Basophils Absolute: 0 K/uL (ref 0.0–0.1)
Basophils Relative: 0 %
Eosinophils Absolute: 0 K/uL (ref 0.0–0.5)
Eosinophils Relative: 0 %
HCT: 28.2 % — ABNORMAL LOW (ref 39.0–52.0)
Hemoglobin: 9.8 g/dL — ABNORMAL LOW (ref 13.0–17.0)
Immature Granulocytes: 1 %
Lymphocytes Relative: 10 %
Lymphs Abs: 0.4 K/uL — ABNORMAL LOW (ref 0.7–4.0)
MCH: 31.7 pg (ref 26.0–34.0)
MCHC: 34.8 g/dL (ref 30.0–36.0)
MCV: 91.3 fL (ref 80.0–100.0)
Monocytes Absolute: 0.2 K/uL (ref 0.1–1.0)
Monocytes Relative: 5 %
Neutro Abs: 3.3 K/uL (ref 1.7–7.7)
Neutrophils Relative %: 84 %
Platelet Count: 189 K/uL (ref 150–400)
RBC: 3.09 MIL/uL — ABNORMAL LOW (ref 4.22–5.81)
RDW: 22.4 % — ABNORMAL HIGH (ref 11.5–15.5)
WBC Count: 4 K/uL (ref 4.0–10.5)
nRBC: 0 % (ref 0.0–0.2)

## 2024-07-16 LAB — TOTAL PROTEIN, URINE DIPSTICK: Protein, ur: NEGATIVE mg/dL

## 2024-07-16 MED ORDER — SODIUM CHLORIDE 0.9 % IV SOLN: INTRAVENOUS | Status: DC

## 2024-07-16 MED ORDER — OXALIPLATIN CHEMO INJECTION 100 MG/20ML
81.0000 mg/m2 | Freq: Once | INTRAVENOUS | Status: AC
  Administered 2024-07-16: 180 mg via INTRAVENOUS
  Filled 2024-07-16: qty 36

## 2024-07-16 MED ORDER — METHYLPHENIDATE HCL 20 MG PO TABS: 20.0000 mg | ORAL_TABLET | Freq: Three times a day (TID) | ORAL | 0 refills | Status: DC

## 2024-07-16 MED ORDER — PALONOSETRON HCL INJECTION 0.25 MG/5ML
0.2500 mg | Freq: Once | INTRAVENOUS | Status: AC
  Administered 2024-07-16: 0.25 mg via INTRAVENOUS
  Filled 2024-07-16: qty 5

## 2024-07-16 MED ORDER — DEXTROSE 5 % IV SOLN: INTRAVENOUS | Status: DC

## 2024-07-16 MED ORDER — METHADONE HCL 10 MG PO TABS: 10.0000 mg | ORAL_TABLET | Freq: Four times a day (QID) | ORAL | 0 refills | Status: DC

## 2024-07-16 MED ORDER — DEXAMETHASONE SOD PHOSPHATE PF 10 MG/ML IJ SOLN
10.0000 mg | Freq: Once | INTRAMUSCULAR | Status: AC
  Administered 2024-07-16: 10 mg via INTRAVENOUS

## 2024-07-16 MED ORDER — SODIUM CHLORIDE 0.9 % IV SOLN
2400.0000 mg/m2 | INTRAVENOUS | Status: DC
  Administered 2024-07-16: 5000 mg via INTRAVENOUS
  Filled 2024-07-16: qty 100

## 2024-07-16 MED ORDER — LEUCOVORIN CALCIUM INJECTION 350 MG
400.0000 mg/m2 | Freq: Once | INTRAVENOUS | Status: AC
  Administered 2024-07-16: 880 mg via INTRAVENOUS
  Filled 2024-07-16: qty 44

## 2024-07-16 MED ORDER — SODIUM CHLORIDE 0.9 % IV SOLN
5.0000 mg/kg | Freq: Once | INTRAVENOUS | Status: AC
  Administered 2024-07-16: 500 mg via INTRAVENOUS
  Filled 2024-07-16: qty 16

## 2024-07-16 NOTE — Assessment & Plan Note (Addendum)
 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  Previous gabapentin  use was ineffective and caused adverse effects.   Experiencing tingling, numbness, and cold sensitivity, particularly affecting daily activities such as dressing. Symptoms are attributed to oxaliplatin . He has started Lyrica but has not noticed significant improvement yet. - Continue Lyrica for neuropathy management. - Monitor symptoms; will consider dose reduction of oxaliplatin  if symptoms worsen.

## 2024-07-16 NOTE — Telephone Encounter (Signed)
 Appt was rescheduled

## 2024-07-16 NOTE — Progress Notes (Signed)
 [facility]  ONCOLOGY CLINIC PROGRESS NOTE   Patient Care Team: Chandra Toribio POUR, MD as PCP - General (Family Medicine) Thukkani, Arun K, MD as PCP - Cardiology (Cardiology) Babs Arthea DASEN, MD as Consulting Physician (Physical Medicine and Rehabilitation) Debby Fidela CROME, NP as Nurse Practitioner (Physical Medicine and Rehabilitation) Lennard Lesta FALCON, MD as Consulting Physician (Gastroenterology)  PATIENT NAME: Derrick Johnson   MR#: [id number] DOB: Jun 28, 1956  Date of visit: 07/16/2024   ASSESSMENT & PLAN:   Derrick Johnson is a 68 y.o. gentleman with a past medical history of rectal cancer diagnosed in February 2021, treated with concurrent chemoradiation using Xeloda , completed treatment in April 2021, patient did not follow-up with surgery or with oncology departments after this. 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 He was referred back to our clinic by his PCP to reestablish care for his history of rectal cancer.  He has recurrence of disease with biopsy-proven lung mets.  Adenocarcinoma of rectum Derrick Johnson) Please review oncology history for additional details and timeline of events.  Patient was originally diagnosed with rectal cancer in 2021 and was treated with concurrent chemoradiation with Xeloda  with eventual plans for surgery.  He was lost to follow-up after that.  Restaging CT chest abdomen pelvis on 02/08/2024 suggested multiple lung nodules, concerning for metastatic recurrence.  On 02/27/2024, Dr. Shelah performed flexible video fiberoptic bronchoscopy with robotic assistance and biopsies of right upper lobe lung nodule.  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  Immunostains were positive for CK20 and CDX2, negative for CK7 and TTF-1, consistent with a colorectal  primary.  cTx,cNx,pM1a, Stage IV A disease.  MSI stable.  NGS testing on the specimen showed no actionable mutations.  All treatment options are palliative in nature, given stage IV disease.    He had an appointment on 03/15/2024 with cardiothoracic surgery Dr. Shyrl for possible resection of the lung lesions:  he likely is out of his radiation window to undergo rectal cancer resection. If this cannot be resected there is minimal advantage to resect the pulmonary nodules   Palliative systemic treatment with FOLFOX started from 03/28/2024.  Avastin  was added from cycle 2 onwards.   Tolerating chemotherapy reasonably well overall with some expected side effects including mouth sores.  Symptoms controlled with supportive therapy including mouth rinses with baking soda and salt.  We prescribed Magic mouthwash previously.  Following completion of 6 cycles of FOLFOX plus Avastin , restaging CT chest, abdomen and pelvis on 06/14/2024 showed significant improvement in lung nodules.  Nonspecific 11 mm hypointense nodular focus in the anterior segment 5 of the liver may reflect focal fatty sparing, although hepatic metastasis could not be ruled out.  Patient cannot undergo MRIs.  Hence plan to monitor this.  Plan to continue current management. Because of progressive mucositis, skin changes, mouth sores, we will start skipping 5-FU bolus dose from cycle 8 onwards (07/02/2024).   Due for cycle 9 of chemotherapy today.  Labs reveal no dose-limiting toxicities.  Will proceed with cycle 9 of chemotherapy with dose adjustments as mentioned above.  He will receive Neulasta with this cycle at the time of 5-FU pump removal.  If progressive neuropathy is noted, we will start cutting down oxaliplatin  dose.   Plan to continue current regimen and repeat imaging in approximately 3 months from the last scan.  RTC in 2 weeks for labs, office visit and cycle 10 of chemotherapy.  Chronic neuropathic pain He had  pre-existing neuropathy which is slowly getting worse.  Previous gabapentin  use was ineffective and caused adverse effects. Lyrica (pregabalin) is proposed as an alternative, starting at a low dose with potential titration. Cymbalta is an option if Lyrica is ineffective. - Initiate Lyrica (pregabalin) at a low dose with titration as needed. - Consider Cymbalta if Lyrica is ineffective.  We will start cutting down oxaliplatin  dose if neuropathy continues to get worse.  Chemotherapy-induced oral mucositis and skin changes of hands Experiencing mouth sores and skin changes on hands, attributed to chemotherapy. Using moisturizers and lotions to manage symptoms. Symptoms are improving but still present. - Continue using moisturizers and lotions for skin care. - Monitor symptoms; will consider further management if symptoms worsen.  Chemotherapy-induced anemia Hemoglobin level has decreased to 9.8 from 12.6, likely due to chemotherapy. No signs of blood loss reported. Anemia may contribute to fatigue and difficulty breathing. - Monitor hemoglobin levels; will consider transfusion and dose reduction if hemoglobin approaches 8.  Chronic pain syndrome Managed with methadone , prescribed by [facility]. He requires a refill of methadone . - Refilled methadone  prescription.  Attention deficit disorder, currently treated Managed with Ritalin , taken three times daily. He is experiencing difficulty managing medication schedule due to ADD. - Continue Ritalin  as prescribed.  History of myocardial infarction Previous myocardial infarction noted. Discussion of potential heart risks with chemotherapy, but no current contraindications for treatment. - Monitor for any cardiac symptoms during chemotherapy   I reviewed lab results and outside records for this visit and discussed relevant results with the patient. Diagnosis, plan of care and treatment options were also discussed in detail with the  patient. Opportunity provided to ask questions and answers provided to his apparent satisfaction. Provided instructions to call our clinic with any problems, questions or concerns prior to return visit. I recommended to continue follow-up with PCP and sub-specialists. He verbalized understanding and agreed with the plan.   NCCN guidelines have been consulted in the planning of this patient's care.  I spent a total of 42 minutes during this encounter with the patient including review of chart and various tests results, discussions about plan of care and coordination of care plan.   [name], MD  07/16/2024 12:40 PM  [facility] - A DEPT OF [name] [place] [address] [place] [address] Dept: [phone] Dept Fax: [phone]    CHIEF COMPLAINT/ REASON FOR VISIT:   Rectal cancer, initially diagnosed and treated in 2021 with concurrent chemoradiation using Xeloda .  Lost to follow-up.  Now with metastatic recurrence in the lungs, biopsy-proven in June 2025.  Current Treatment: Palliative systemic treatment with FOLFOX started from 03/28/2024.  Avastin  was added from cycle 2 onwards.   INTERVAL HISTORY:    Discussed the use of AI scribe software for clinical note transcription with the patient, who gave verbal consent to proceed.  History of Present Illness  Derrick Johnson is a 68 year old male undergoing chemotherapy who presents for follow-up regarding treatment side effects.  After a recent reduction in chemotherapy dosage, he experienced prolonged fatigue, typically starting on Sunday or Monday, but this time it extended longer. He is dealing with constipation and diarrhea, which are beginning to stabilize, and he manages these symptoms with daily medication.  He continues to experience significant side effects from chemotherapy, including  mouth sores and skin changes on his hands. He applies Outer  Balm every hour to manage the skin symptoms, noting that his hands feel stiff and slick. Despite these efforts, his hands appear aged and feel uncomfortable.  He experiences tingling and numbness in his hands, particularly when trying to touch his thumb to his fingers, which causes pain. He experiences tingling and numbness in his hands, particularly when trying to touch his thumb to his fingers, which causes pain, as well as cold sensitivity. He has adapted by using a button looper to dress himself, which takes considerable time. He started Lyrica recently but is unsure of its effectiveness due to multiple medications.  His hemoglobin levels have decreased, with the last reading at 9.8, down from 12.6. He has not noticed any blood loss and attributes this to chemotherapy. He experiences difficulty breathing.  He takes methadone , four tablets daily, for pain management, initially prescribed by [facility]. Additionally, he takes Ritalin  three times daily for ADD, noting that it helps him remain calm and focused.   I have reviewed the past medical history, past surgical history, social history and family history with the patient and they are unchanged from previous note.  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  2 small perirectal nodes measuring 3 to 4 mm   Lower EUS 11/13/2019-tumor in the proximal rectum.  Proximal posterolateral rectal mass.  2 abnormal lymph nodes in the perirectal region.  Staged T3N1 by endorectal ultrasound.    Previously he was seen by Dr. Cloretta in our clinic.   Xeloda /radiation 11/25/2019-01/01/2020   He was supposed to undergo surgical resection under the direction of Dr. Debby in June 2021.   Patient apparently had dental infections around that time and needed dental extractions and patient deferred surgery and did not seek follow-up care either with the surgeons or with our department.  He also did not have follow-up appointments with gastroenterology.   His PCP referred him back to his and he reestablished care with us  on 01/30/2024.  CEA was normal at 1.68.  CBCD and CMP were unremarkable.  Plan made for restaging CT scan of the chest, abdomen and pelvis and referral sent to GI for endoscopic evaluation.  Patient cannot get MRIs because of nerve stimulator in place.   On 02/08/2024, restaging CT chest, abdomen and pelvis showed multiple lung nodules, largest 1 measuring 1.9 cm, concerning for pulmonary metastatic disease.  Previously seen circumferential superior rectal mass was no longer distinctly visible.  No evidence of lymphadenopathy or metastatic disease in the abdomen or pelvis.   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  No evidence of metastatic adenopathy in the chest or evidence of other metastatic disease in the abdomen or pelvis.  On 02/27/2024, Dr. Shelah performed flexible video fiberoptic bronchoscopy with robotic assistance and biopsies of right upper lobe lung nodule.  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  Immunostains were positive for CK20 and CDX2, negative for CK7 and TTF-1, consistent with a colorectal primary.  cTx,cNx,pM1a, Stage IV A disease.   All treatment options are palliative in nature, given stage IV disease.    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  MSI stable.  Overall no actionable mutations.  He had an appointment on 03/15/2024 with cardiothoracic surgery Dr. Shyrl for possible resection of the lung lesions:  he likely is out of his radiation window  to undergo rectal cancer resection. If this cannot be resected there is minimal advantage to resect the pulmonary nodules   Palliative systemic treatment with FOLFOX started from 03/28/2024.  Avastin  was added from cycle 2 onwards.   Following completion of 6 cycles of FOLFOX plus Avastin , restaging CT chest, abdomen and pelvis on 06/14/2024 showed significant improvement in lung nodules.  Nonspecific 11 mm hypointense nodular focus in the anterior segment 5 of the liver may reflect focal fatty sparing, although hepatic metastasis could not be ruled out.  Patient cannot undergo MRIs.  Hence plan to monitor this.  Plan to continue current management.  Because of progressive mucositis, skin changes, mouth sores, we started skipping 5-FU bolus dose from cycle 8 onwards (07/02/2024).  If progressive neuropathy is noted, we will start cutting down oxaliplatin  dose.  Oncology History  Adenocarcinoma of rectum (HCC)  11/07/2019 Initial Diagnosis   Adenocarcinoma of rectum (HCC)   03/12/2024 Cancer Staging   Staging form: Colon and Rectum, AJCC 8th Edition - Clinical stage from 03/12/2024: Stage IVA (rcTX, rcNX, rpM1a) - Signed by Autumn Millman, MD on 03/12/2024 Stage prefix: Recurrence   03/28/2024 -  Chemotherapy   Patient is on Treatment Plan : COLORECTAL FOLFOX + Bevacizumab  q14d         REVIEW OF SYSTEMS:   Review of Systems - Oncology  All other pertinent systems were reviewed with the patient and are negative.  ALLERGIES: He is allergic to latex.  MEDICATIONS:  Current Outpatient Medications  Medication Sig Dispense Refill   albuterol  (PROVENTIL ) (2.5 MG/3ML) 0.083% nebulizer solution Take 2.5 mg by nebulization every 6 (six) hours as needed for wheezing or shortness of breath.      albuterol  (VENTOLIN  HFA) 108 (90 Base) MCG/ACT inhaler Inhale 1-2 puffs into the lungs every 6 (six) hours as needed for wheezing or shortness of breath. 1 Inhaler 5   aspirin  81 MG chewable tablet Chew 1  tablet (81 mg total) by mouth daily. 90 tablet 3   atorvastatin  (LIPITOR) 80 MG tablet Take 1 tablet (80 mg total) by mouth daily. 90 tablet 3   clopidogrel  (PLAVIX ) 75 MG tablet Take 1 tablet (75 mg total) by mouth daily. 90 tablet 1   cyclobenzaprine (FLEXERIL) 10 MG tablet TAKE 1 TABLET BY MOUTH EVERY 8 HOURS AS NEEDED FOR PAIN OR SPASMS. 60 tablet 0   dexamethasone  (DECADRON ) 4 MG tablet Take 2 tablets (8 mg total) by mouth daily. Start the day after chemotherapy for 2 days. Take with food. 30 tablet 1   furosemide  (LASIX ) 20 MG tablet Take 1 tablet (20 mg total) by mouth daily. 90 tablet 3   lidocaine -prilocaine  (EMLA ) cream Apply to affected area once 30 g 3   linaclotide  (LINZESS ) 145 MCG CAPS capsule Take 1 capsule (145 mcg total) by mouth daily before breakfast. 30 capsule 3   magic mouthwash (nystatin , lidocaine , diphenhydrAMINE, alum & mag hydroxide) suspension Take 5 mLs by mouth every 4 (four) hours as needed for mouth pain. 500 mL 6   metoprolol  succinate (TOPROL -XL) 25 MG 24 hr tablet Take 1 tablet (25 mg total) by mouth at bedtime.     nitroGLYCERIN  (NITROSTAT ) 0.4 MG SL tablet Place 1 tablet (0.4 mg total) under the tongue  every 5 (five) minutes x 3 doses as needed for chest pain. 25 tablet 3   ondansetron  (ZOFRAN ) 8 MG tablet TAKE 1 TABLET BY MOUTH EVERY 8 HOURS AS NEEDED FOR NAUSEA OR VOMITING. START ON THE THIRD DAY AFTER CHEMOTHERAPY. 30 tablet 1   polyethylene glycol (MIRALAX) 17 g packet Take 17 g by mouth daily.     pregabalin (LYRICA) 50 MG capsule Take 1 capsule (50 mg total) by mouth daily. 30 capsule 3   prochlorperazine  (COMPAZINE ) 10 MG tablet TAKE 1 TABLET BY MOUTH EVERY 6 HOURS AS NEEDED FOR NAUSEA OR VOMITING. 30 tablet 1   sacubitril -valsartan  (ENTRESTO ) 24-26 MG Take 1 tablet by mouth 2 (two) times daily. 180 tablet 3   spironolactone  (ALDACTONE ) 25 MG tablet Take 0.5 tablets (12.5 mg total) by mouth daily. 45 tablet 3   SUMAtriptan  (IMITREX ) 100 MG tablet TAKE 1  TABLET BY MOUTH AS NEEDED, MAY REPEAT IN 2 HOURS AS NEEDED IF HEADACHE PERSISTS OR REOCCURS. MAX 2 PER 24 HOURS. MAX 2 DAYS PER WEEK 12 tablet 1   tamsulosin (FLOMAX) 0.4 MG CAPS capsule TAKE 1 TO 2 CAPSULES BY MOUTH AT BEDTIME WITH MEAL FOR PROSTATE. **DUE FOR OFFICE VISIT 04/17/24** 180 capsule 0   traZODone  (DESYREL ) 150 MG tablet Take 0.5 tablets (75 mg total) by mouth at bedtime as needed for sleep.     VITAMIN D -VITAMIN K PO Take 1 capsule by mouth daily. 10,000IU/ 100 mcg     Wheat Dextrin (BENEFIBER PO) Take 1 tablet by mouth 3 (three) times daily as needed (Constipation per wating report).     methadone  (DOLOPHINE ) 10 MG tablet Take 1 tablet (10 mg total) by mouth in the morning, at noon, in the evening, and at bedtime. 120 tablet 0   methylphenidate  (RITALIN ) 20 MG tablet Take 1 tablet (20 mg total) by mouth 3 (three) times daily. 180 tablet 0   No current facility-administered medications for this visit.   Facility-Administered Medications Ordered in Other Visits  Medication Dose Route Frequency Provider Last Rate Last Admin   0.9 %  sodium chloride  infusion   Intravenous Continuous Marjorie Lussier, MD 10 mL/hr at 07/16/24 1018 New Bag at 07/16/24 1018   clindamycin  (CLEOCIN ) 900 mg in dextrose  5 % 50 mL IVPB  900 mg Intravenous 60 min Pre-Op Debby Hila, MD       And   gentamicin  (GARAMYCIN ) 5 mg/kg in dextrose  5 % 50 mL IVPB  5 mg/kg Intravenous 60 min Pre-Op Debby Hila, MD       dextrose  5 % solution   Intravenous Continuous Chanelle Hodsdon, MD 10 mL/hr at 07/16/24 1207 New Bag at 07/16/24 1207   fluorouracil  (ADRUCIL ) 5,000 mg in sodium chloride  0.9 % 150 mL chemo infusion  2,400 mg/m2 (Treatment Plan Recorded) Intravenous 1 day or 1 dose Shylah Dossantos, MD       leucovorin  880 mg in dextrose  5 % 250 mL infusion  400 mg/m2 (Treatment Plan Recorded) Intravenous Once Keion Neels, MD 147 mL/hr at 07/16/24 1209 880 mg at 07/16/24 1209   oxaliplatin  (ELOXATIN ) 180 mg in dextrose  5  % 500 mL chemo infusion  81 mg/m2 (Treatment Plan Recorded) Intravenous Once Maritsa Hunsucker, MD 268 mL/hr at 07/16/24 1212 180 mg at 07/16/24 1212     VITALS:   Blood pressure 121/84, pulse 97, temperature 98.6 F (37 C), temperature source Temporal, resp. rate 16, weight 219 lb 9.6 oz (99.6 kg), SpO2 97%.  Wt Readings from Last 3 Encounters:  07/16/24 219 lb 9.6 oz (99.6 kg)  07/02/24 209 lb 14.4 oz (95.2 kg)  06/18/24 204 lb 14.4 oz (92.9 kg)    Body mass index is 32.43 kg/m.    Onc Performance Status - 07/16/24 0921       ECOG Perf Status   ECOG Perf Status Ambulatory and capable of all selfcare but unable to carry out any work activities.  Up and about more than 50% of waking hours      KPS SCALE   KPS % SCORE Cares for self, unable to carry on normal activity or to do active work           PHYSICAL EXAM:   Physical Exam Constitutional:      General: He is not in acute distress.    Appearance: Normal appearance.  HENT:     Head: Normocephalic and atraumatic.  Eyes:     Conjunctiva/sclera: Conjunctivae normal.  Cardiovascular:     Rate and Rhythm: Normal rate and regular rhythm.  Pulmonary:     Effort: Pulmonary effort is normal. No respiratory distress.  Abdominal:     General: There is no distension.  Neurological:     General: No focal deficit present.     Mental Status: He is alert and oriented to person, place, and time.  Psychiatric:        Mood and Affect: Mood normal.        Behavior: Behavior normal.      LABORATORY DATA:   I have reviewed the data as listed.  Results for orders placed or performed in visit on 07/16/24  Total Protein, Urine dipstick  Result Value Ref Range   Protein, ur NEGATIVE NEGATIVE mg/dL  CMP (Cancer Center only)  Result Value Ref Range   Sodium 134 (L) 135 - 145 mmol/L   Potassium 4.6 3.5 - 5.1 mmol/L   Chloride 100 98 - 111 mmol/L   CO2 22 22 - 32 mmol/L   Glucose, Bld 178 (H) 70 - 99 mg/dL   BUN 25 (H) 8 - 23  mg/dL   Creatinine 8.99 9.38 - 1.24 mg/dL   Calcium  8.9 8.9 - 10.3 mg/dL   Total Protein 6.7 6.5 - 8.1 g/dL   Albumin 4.0 3.5 - 5.0 g/dL   AST 37 15 - 41 U/L   ALT 28 0 - 44 U/L   Alkaline Phosphatase 97 38 - 126 U/L   Total Bilirubin 0.6 0.0 - 1.2 mg/dL   GFR, Estimated >39 >39 mL/min   Anion gap 12 5 - 15  CBC with Differential (Cancer Center Only)  Result Value Ref Range   WBC Count 4.0 4.0 - 10.5 K/uL   RBC 3.09 (L) 4.22 - 5.81 MIL/uL   Hemoglobin 9.8 (L) 13.0 - 17.0 g/dL   HCT 71.7 (L) 60.9 - 47.9 %   MCV 91.3 80.0 - 100.0 fL   MCH 31.7 26.0 - 34.0 pg   MCHC 34.8 30.0 - 36.0 g/dL   RDW 77.5 (H) 88.4 - 84.4 %   Platelet Count 189 150 - 400 K/uL   nRBC 0.0 0.0 - 0.2 %   Neutrophils Relative % 84 %   Neutro Abs 3.3 1.7 - 7.7 K/uL   Lymphocytes Relative 10 %   Lymphs Abs 0.4 (L) 0.7 - 4.0 K/uL   Monocytes Relative 5 %   Monocytes Absolute 0.2 0.1 - 1.0 K/uL   Eosinophils Relative 0 %   Eosinophils Absolute 0.0 0.0 - 0.5 K/uL  Basophils Relative 0 %   Basophils Absolute 0.0 0.0 - 0.1 K/uL   Immature Granulocytes 1 %   Abs Immature Granulocytes 0.04 0.00 - 0.07 K/uL       RADIOGRAPHIC STUDIES:  CT CHEST ABDOMEN PELVIS W CONTRAST CLINICAL DATA:  Restaging stage IV rectal cancer. * Tracking Code: BO *  EXAM: CT CHEST, ABDOMEN, AND PELVIS WITH CONTRAST  TECHNIQUE: Multidetector CT imaging of the chest, abdomen and pelvis was performed following the standard protocol during bolus administration of intravenous contrast.  RADIATION DOSE REDUCTION: This exam was performed according to the departmental dose-optimization program which includes automated exposure control, adjustment of the mA and/or kV according to patient size and/or use of iterative reconstruction technique.  CONTRAST:  OMNIPAQUE  IOHEXOL  300 MG/ML  SOLN  COMPARISON:  PET-CT February 23, 2024 and CT February 08, 2024  FINDINGS: CT CHEST FINDINGS  Cardiovascular: Accessed right chest Port-A-Cath  with tip in the right atrium. Three-vessel coronary artery calcifications. Normal size heart. No significant pericardial effusion/thickening.  Mediastinum/Nodes: No suspicious thyroid  nodule. No pathologically enlarged mediastinal, hilar or axillary lymph nodes. The esophagus is grossly unremarkable.  Lungs/Pleura: Decreased size of the bilateral pulmonary nodules. For reference:  -right upper lobe pulmonary nodule on image 40/7 is now predominantly a thin walled cyst measuring 7 mm with a dominant soft tissue nodular focus along the lateral margin measuring 3 mm on image 40/7 previously this was a solid nodule measuring 19 x 17 mm.  -left anterior upper lobe pulmonary nodule on image 62/7 demonstrates a similar features with a cystic component measuring 8 mm and the dominant nodular component measuring 2 mm on image 62/7 previously this was a solid nodule measuring 13 x 12 mm.  No new suspicious pulmonary nodules or masses.  Musculoskeletal: No aggressive lytic or blastic lesion of bone. Intrathecal spinal stimulator lead.  CT ABDOMEN PELVIS FINDINGS  Hepatobiliary: New hypodense nodular focus in anterior segment V measuring 11 mm on image 65/2. Gallbladder surgically absent. No biliary ductal dilation.  Pancreas: No pancreatic ductal dilation or evidence of acute inflammation.  Spleen: No splenomegaly.  Adrenals/Urinary Tract: No suspicious adrenal nodule/mass. No hydronephrosis. Kidneys demonstrate symmetric enhancement. Urinary bladder is unremarkable for degree of distension.  Stomach/Bowel: Stomach is distended with ingested material and gas without focal wall thickening. No pathologic dilation of small or large bowel. No evidence of acute bowel inflammation.  No discrete rectal mass or asymmetric wall thickening identified.  Vascular/Lymphatic: Normal caliber abdominal aorta. Smooth IVC contours. The portal, splenic and superior mesenteric veins are patent. No  pathologically enlarged abdominal or pelvic lymph nodes.  Reproductive: Uterus and bilateral adnexa are unremarkable.  Other: No significant abdominopelvic free fluid.  Musculoskeletal: No aggressive lytic or blastic lesion of bone. Degenerative change of the bilateral hips.  IMPRESSION: 1. New hypodense nodular focus in anterior segment V measuring 11 mm, may reflect focal fatty sparing although a hepatic metastasis is a pertinent differential consideration. Suggest more definitive assessment by abdominal MRI with and without contrast. 2. Decreased size of the bilateral pulmonary nodules, consistent with treatment response. 3. No discrete rectal mass or asymmetric wall thickening identified.  Electronically Signed   By: Reyes Holder M.D.   On: 06/17/2024 08:04    CODE STATUS:  Code Status History     Date Active Date Inactive Code Status Order ID Comments User Context   11/27/2023 2206 11/29/2023 1747 Full Code [id number]  Cesario Chamorro, MD Inpatient    Questions for Most Recent Historical  Code Status (Order [id number])     Question Answer   By: Consent: discussion documented in EHR            Orders Placed This Encounter  Procedures   CBC with Differential (Cancer Center Only)    Standing Status:   Future    Expected Date:   07/29/2024    Expiration Date:   07/29/2025   CMP (Cancer Center only)    Standing Status:   Future    Expected Date:   07/29/2024    Expiration Date:   07/29/2025   Total Protein, Urine dipstick    Standing Status:   Future    Expected Date:   07/29/2024    Expiration Date:   07/29/2025   CBC with Differential (Cancer Center Only)    Standing Status:   Future    Expected Date:   08/12/2024    Expiration Date:   08/12/2025   CMP (Cancer Center only)    Standing Status:   Future    Expected Date:   08/12/2024    Expiration Date:   08/12/2025   Total Protein, Urine dipstick    Standing Status:   Future    Expected Date:   08/12/2024     Expiration Date:   08/12/2025     Future Appointments  Date Time Provider Department Center  07/18/2024  1:45 PM [facility] None  07/22/2024 10:30 AM [facility]  07/29/2024 11:10 AM Chandra Toribio POUR, MD [facility]    This document was completed utilizing engineer, civil (consulting). Grammatical errors, random word insertions, pronoun errors, and incomplete sentences are an occasional consequence of this system due to software limitations, ambient noise, and hardware issues. Any formal questions or concerns about the content, text or information contained within the body of this dictation should be directly addressed to the provider for clarification.

## 2024-07-16 NOTE — Patient Instructions (Signed)
 [facility] A DEPT OF [facility]  Discharge Instructions: Thank you for choosing [facility] to provide your oncology and hematology care.   If you have a lab appointment with the Cancer Center, please go directly to the Cancer Center and check in at the registration area.   Wear comfortable clothing and clothing appropriate for easy access to any Portacath or PICC line.   We strive to give you quality time with your provider. You may need to reschedule your appointment if you arrive late (15 or more minutes).  Arriving late affects you and other patients whose appointments are after yours.  Also, if you miss three or more appointments without notifying the office, you may be dismissed from the clinic at the provider's discretion.      For prescription refill requests, have your pharmacy contact our office and allow 72 hours for refills to be completed.    Today you received the following chemotherapy and/or immunotherapy agents: bevacizumab, oxaliplatin, leucovorin, fluorouracil      To help prevent nausea and vomiting after your treatment, we encourage you to take your nausea medication as directed.  BELOW ARE SYMPTOMS THAT SHOULD BE REPORTED IMMEDIATELY: *FEVER GREATER THAN 100.4 F (38 C) OR HIGHER *CHILLS OR SWEATING *NAUSEA AND VOMITING THAT IS NOT CONTROLLED WITH YOUR NAUSEA MEDICATION *UNUSUAL SHORTNESS OF BREATH *UNUSUAL BRUISING OR BLEEDING *URINARY PROBLEMS (pain or burning when urinating, or frequent urination) *BOWEL PROBLEMS (unusual diarrhea, constipation, pain near the anus) TENDERNESS IN MOUTH AND THROAT WITH OR WITHOUT PRESENCE OF ULCERS (sore throat, sores in mouth, or a toothache) UNUSUAL RASH, SWELLING OR PAIN  UNUSUAL VAGINAL DISCHARGE OR ITCHING   Items with * indicate a potential emergency and should be followed up as soon as possible or go to the Emergency Department if any problems should occur.  Please show the  CHEMOTHERAPY ALERT CARD or IMMUNOTHERAPY ALERT CARD at check-in to the Emergency Department and triage nurse.  Should you have questions after your visit or need to cancel or reschedule your appointment, please contact [facility] - A DEPT OF [facility]  Dept: [phone]  and follow the prompts.  Office hours are 8:00 a.m. to 4:30 p.m. Monday - Friday. Please note that voicemails left after 4:00 p.m. may not be returned until the following business day.  We are closed weekends and major holidays. You have access to a nurse at all times for urgent questions. Please call the main number to the clinic Dept: [phone] and follow the prompts.   For any non-urgent questions, you may also contact your provider using MyChart. We now offer e-Visits for anyone 38 and older to request care online for non-urgent symptoms. For details visit [email or web address].   Also download the MyChart app! Go to the app store, search "MyChart", open the app, select [facility], and log in with your MyChart username and password.

## 2024-07-16 NOTE — Telephone Encounter (Signed)
 Copied from CRM [id number]. Topic: Appointments - Scheduling Inquiry for Clinic >> Jul 16, 2024  4:09 PM Shanda MATSU wrote: Reason for CRM: Dr. Clovis office called in req that the patient be contacted to reschedule appt he has with Dr. Chandra on 11/24 @ 11:10am, caller stated that appt patient has with Dr. Pasam for the same date is for a time sensitive treatment that the patient needs.

## 2024-07-16 NOTE — Patient Instructions (Signed)

## 2024-07-16 NOTE — Progress Notes (Signed)
 Patient seen by Dr. Archie Patten Pasam today  Vitals are within treatment parameters:Yes   Labs are within treatment parameters: Yes   Treatment plan has been signed: Yes   Per physician team, Patient is ready for treatment and there are NO modifications to the treatment plan.

## 2024-07-16 NOTE — Assessment & Plan Note (Addendum)
 Please review oncology history for additional details and timeline of events.  Patient was originally diagnosed with rectal cancer in 2021 and was treated with concurrent chemoradiation with Xeloda  with eventual plans for surgery.  He was lost to follow-up after that.  Restaging CT chest abdomen pelvis on 02/08/2024 suggested multiple lung nodules, concerning for metastatic recurrence.  On 02/27/2024, Dr. Shelah performed flexible video fiberoptic bronchoscopy with robotic assistance and biopsies of right upper lobe lung nodule.  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  Immunostains were positive for CK20 and CDX2, negative for CK7 and TTF-1, consistent with a colorectal primary.  cTx,cNx,pM1a, Stage IV A disease.  MSI stable.  NGS testing on the specimen showed no actionable mutations.  All treatment options are palliative in nature, given stage IV disease.    He had an appointment on 03/15/2024 with cardiothoracic surgery Dr. Shyrl for possible resection of the lung lesions:  he likely is out of his radiation window to undergo rectal cancer resection. If this cannot be resected there is minimal advantage to resect the pulmonary nodules   Palliative systemic treatment with FOLFOX started from 03/28/2024.  Avastin  was added from cycle 2 onwards.   Tolerating chemotherapy reasonably well overall with some expected side effects including mouth sores.  Symptoms controlled with supportive therapy including mouth rinses with baking soda and salt.  We prescribed Magic mouthwash previously.  Following completion of 6 cycles of FOLFOX plus Avastin , restaging CT chest, abdomen and pelvis on 06/14/2024 showed significant improvement in lung nodules.  Nonspecific 11 mm hypointense nodular focus in the anterior segment 5 of the liver may reflect focal fatty sparing, although hepatic metastasis could not be ruled out.  Patient cannot undergo MRIs.  Hence plan to monitor this.  Plan to continue current  management. Because of progressive mucositis, skin changes, mouth sores, we will start skipping 5-FU bolus dose from cycle 8 onwards (07/02/2024).   Due for cycle 9 of chemotherapy today.  Labs reveal no dose-limiting toxicities.  Will proceed with cycle 9 of chemotherapy with dose adjustments as mentioned above.  He will receive Neulasta with this cycle at the time of 5-FU pump removal.  If progressive neuropathy is noted, we will start cutting down oxaliplatin  dose.   Plan to continue current regimen and repeat imaging in approximately 3 months from the last scan.   RTC in 2 weeks for labs, office visit and cycle 10 of chemotherapy.

## 2024-07-17 ENCOUNTER — Encounter: Payer: Self-pay | Admitting: Oncology

## 2024-07-17 ENCOUNTER — Other Ambulatory Visit: Payer: Self-pay

## 2024-07-17 ENCOUNTER — Other Ambulatory Visit

## 2024-07-17 ENCOUNTER — Other Ambulatory Visit: Payer: Self-pay | Admitting: Family Medicine

## 2024-07-18 ENCOUNTER — Other Ambulatory Visit: Payer: Self-pay | Admitting: Family Medicine

## 2024-07-18 ENCOUNTER — Encounter: Payer: Self-pay | Admitting: Family Medicine

## 2024-07-18 ENCOUNTER — Inpatient Hospital Stay

## 2024-07-18 ENCOUNTER — Telehealth: Payer: Self-pay

## 2024-07-18 ENCOUNTER — Ambulatory Visit: Payer: Self-pay | Admitting: Family Medicine

## 2024-07-18 ENCOUNTER — Ambulatory Visit (INDEPENDENT_AMBULATORY_CARE_PROVIDER_SITE_OTHER): Admitting: Family Medicine

## 2024-07-18 VITALS — BP 107/64 | HR 83 | Ht 69.0 in | Wt 219.1 lb

## 2024-07-18 VITALS — BP 116/64 | HR 93 | Temp 98.7°F | Resp 16

## 2024-07-18 DIAGNOSIS — R7303 Prediabetes: Secondary | ICD-10-CM | POA: Insufficient documentation

## 2024-07-18 DIAGNOSIS — C20 Malignant neoplasm of rectum: Secondary | ICD-10-CM | POA: Diagnosis not present

## 2024-07-18 DIAGNOSIS — G43109 Migraine with aura, not intractable, without status migrainosus: Secondary | ICD-10-CM

## 2024-07-18 DIAGNOSIS — G950 Syringomyelia and syringobulbia: Secondary | ICD-10-CM

## 2024-07-18 DIAGNOSIS — Z79899 Other long term (current) drug therapy: Secondary | ICD-10-CM

## 2024-07-18 DIAGNOSIS — Z79890 Hormone replacement therapy (postmenopausal): Secondary | ICD-10-CM

## 2024-07-18 DIAGNOSIS — I255 Ischemic cardiomyopathy: Secondary | ICD-10-CM | POA: Diagnosis not present

## 2024-07-18 DIAGNOSIS — E349 Endocrine disorder, unspecified: Secondary | ICD-10-CM

## 2024-07-18 DIAGNOSIS — M797 Fibromyalgia: Secondary | ICD-10-CM

## 2024-07-18 DIAGNOSIS — C7800 Secondary malignant neoplasm of unspecified lung: Secondary | ICD-10-CM

## 2024-07-18 DIAGNOSIS — E785 Hyperlipidemia, unspecified: Secondary | ICD-10-CM

## 2024-07-18 DIAGNOSIS — Z5111 Encounter for antineoplastic chemotherapy: Secondary | ICD-10-CM | POA: Diagnosis not present

## 2024-07-18 DIAGNOSIS — G894 Chronic pain syndrome: Secondary | ICD-10-CM

## 2024-07-18 DIAGNOSIS — Z5181 Encounter for therapeutic drug level monitoring: Secondary | ICD-10-CM

## 2024-07-18 DIAGNOSIS — E291 Testicular hypofunction: Secondary | ICD-10-CM

## 2024-07-18 DIAGNOSIS — Z6832 Body mass index (BMI) 32.0-32.9, adult: Secondary | ICD-10-CM

## 2024-07-18 DIAGNOSIS — F908 Attention-deficit hyperactivity disorder, other type: Secondary | ICD-10-CM

## 2024-07-18 LAB — LIPID PANEL
Chol/HDL Ratio: 3.8 ratio (ref 0.0–5.0)
Cholesterol, Total: 134 mg/dL (ref 100–199)
HDL: 35 mg/dL — ABNORMAL LOW (ref >39)
LDL Chol Calc (NIH): 62 mg/dL (ref 0–99)
Triglycerides: 230 mg/dL — ABNORMAL HIGH (ref 0–149)
VLDL Cholesterol Cal: 37 mg/dL (ref 5–40)

## 2024-07-18 LAB — HEMOGLOBIN A1C
Est. average glucose Bld gHb Est-mCnc: 131 mg/dL
Hgb A1c MFr Bld: 6.2 % — ABNORMAL HIGH (ref 4.8–5.6)

## 2024-07-18 LAB — TSH: TSH: 1.91 u[IU]/mL (ref 0.450–4.500)

## 2024-07-18 LAB — TESTOSTERONE: Testosterone: >1500 ng/dL — ABNORMAL HIGH (ref 264–916)

## 2024-07-18 MED ORDER — NURTEC 75 MG PO TBDP: 1.0000 | ORAL_TABLET | ORAL | 2 refills | Status: AC

## 2024-07-18 MED ORDER — PEGFILGRASTIM INJECTION 6 MG/0.6ML ~~LOC~~
6.0000 mg | PREFILLED_SYRINGE | Freq: Once | SUBCUTANEOUS | Status: AC
  Administered 2024-07-18: 6 mg via SUBCUTANEOUS
  Filled 2024-07-18: qty 0.6

## 2024-07-18 MED ORDER — METHYLPHENIDATE HCL 20 MG PO TABS: 20.0000 mg | ORAL_TABLET | Freq: Three times a day (TID) | ORAL | 0 refills | Status: DC

## 2024-07-18 NOTE — Assessment & Plan Note (Signed)
 A1C is 6.2, up from 5.4 five years ago. Possibly related to corticosteroid use while undergoing chemotherapy.  Will continue to monitor

## 2024-07-18 NOTE — Assessment & Plan Note (Signed)
-   Ongoing chemotherapy treatment with an oncologist. Reports blood work has been surprisingly good. States surgery remains a possibility but has not been discussed recently. Experiences significant side effects post-treatment, including fatigue and cognitive impairment (chemo brain). He finds these side effects manageable as long as he understands they are treatment-related. - Continue current oncology management. - No changes to current medications at this time.

## 2024-07-18 NOTE — Telephone Encounter (Signed)
 Pt lost his current Rx of methylphenidate  (RITALIN ) 20 MG tablet. Pt went to [facility] and they confirmed that he pick up the Rx and its due to be refilled on 08/02/24 and he is asking for a short supply to get him to his refill date.  Spoke with provider and he has agreed to fill it until the 08/02/24

## 2024-07-18 NOTE — Assessment & Plan Note (Signed)
 Misplaced his ritalin , or he may have forgot to pick it up.  Advised him to ask [facility] if it has been filled recently.  A prescription was written two days ago. If it was picked up already we can do a one time refill early.

## 2024-07-18 NOTE — Patient Instructions (Signed)
 It was nice to see you today,  We addressed the following topics today: - Please go to [facility] pharmacy to ask if your ritalin  prescription was filled. If it was not, they may have it waiting for you. If it was filled and you cannot find it, please let me know, and I will send in a new prescription. - You can ask the pharmacy about dose pack services, which package your morning and evening medications together to make them easier to take. If you would like to use this service and they do not offer it, let us  know, and we can send your prescriptions to a pharmacy that does. - I will provide you with a paper listing all of your medications and what each one is for. You can tape this to your medicine box for reference. - I am prescribing Nurtec for your migraines. You can take it every other day to prevent them or just when you feel a migraine starting. It is a dissolvable tablet. - We will schedule your next visit as a physical in six months. We will get labs done before that appointment.  Have a great day,  Rolan Slain, MD

## 2024-07-18 NOTE — Assessment & Plan Note (Signed)
 Pt took his testosterone  injection 1-2 days prior to labs, which would explain the high lab value.  No change today.  Will recheck prior to next visit.

## 2024-07-18 NOTE — Assessment & Plan Note (Signed)
-   Long history of migraines. Has tried Imitrex  , Topamax , and Aimovig  and Ajovy  in the past, with limited success or adverse reaction to the shot. - Will prescribe Nurtec ODT.

## 2024-07-18 NOTE — Progress Notes (Signed)
 Established Patient Office Visit  Subjective   Patient ID: Derrick Johnson, male    DOB: Sep 29, 1955  Age: 68 y.o. MRN: [id number]  Chief Complaint  Patient presents with   Medical Management of Chronic Issues    HPI  Subjective - Follow-up for ongoing oncology care. Reports today is a difficult day, occurring 1-2 days post-chemotherapy treatment. Experiences fatigue and cognitive difficulties (chemo brain), including short-term memory loss and word-finding difficulty. States he feels better after 4-5 days. - Reports misplacing a newly filled prescription for ritalin . He is unsure if he picked it up from [facility] pharmacy. - Reports long-standing history of migraines, sometimes associated with visual changes. Current management is not optimal.  Medications Current medications are for heart issues and blood pressure. Has many prescriptions and finds it difficult to keep track of them. Reports taking testosterone  shots and was late with a recent dose, taking it just before recent lab work.  PMH, PSH, FH, Social Hx PMHx: Metastatic cancer, originally diagnosed in 2021, now in lungs. Congestive heart failure. Migraines. Social Hx: Lives with wife. Father, mother, and sister visit. Denies other people in the home.  ROS NEURO: Positive for short-term memory impairment and word-finding difficulty, which he attributes to chemotherapy. History of migraines, sometimes with visual aura. CONSTITUTIONAL: Positive for fatigue, especially after chemotherapy. HEENT: History of mouth ulcers related to chemotherapy, which improved with a medication change.   The ASCVD Risk score (Arnett DK, et al., 2019) failed to calculate for the following reasons:   Risk score cannot be calculated because patient has a medical history suggesting prior/existing ASCVD  Health Maintenance Due  Topic Date Due   Medicare Annual Wellness (AWV)  Never done   COVID-19 Vaccine (1) Never done   Hepatitis C Screening   Never done   DTaP/Tdap/Td (1 - Tdap) Never done   Colonoscopy  Never done      Objective:     BP 107/64   Pulse 83   Ht 5' 9 (1.753 m)   Wt 219 lb 1.9 oz (99.4 kg)   SpO2 99%   BMI 32.36 kg/m    Physical Exam Gen: alert, oriented. Mildly fatigued appearing Pulm: no respiratory distress Psych: pleasant affect   No results found for any visits on 07/18/24.      Assessment & Plan:   Migraine with aura and without status migrainosus, not intractable Assessment & Plan: - Long history of migraines. Has tried Imitrex  , Topamax , and Aimovig  and Ajovy  in the past, with limited success or adverse reaction to the shot. - Will prescribe Nurtec ODT.    Prediabetes Assessment & Plan: A1C is 6.2, up from 5.4 five years ago. Possibly related to corticosteroid use while undergoing chemotherapy.  Will continue to monitor  Orders: -     Hemoglobin A1c; Future  Other specified attention deficit hyperactivity disorder (ADHD) Assessment & Plan: Misplaced his ritalin , or he may have forgot to pick it up.  Advised him to ask [facility] if it has been filled recently.  A prescription was written two days ago. If it was picked up already we can do a one time refill early.    Testosterone  insufficiency Assessment & Plan: Pt took his testosterone  injection 1-2 days prior to labs, which would explain the high lab value.  No change today.  Will recheck prior to next visit.   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 Reports blood work has been surprisingly good. States surgery remains a possibility but has not been discussed recently. Experiences significant side effects post-treatment, including fatigue and cognitive impairment (chemo brain). He finds these side effects manageable as long as he understands they are  treatment-related. - Continue current oncology management. - No changes to current medications at this time.  Orders: -     CBC with Differential/Platelet; Future  BMI 32.0-32.9,adult -     Comprehensive metabolic panel with GFR; Future -     Lipid panel; Future -     Hemoglobin A1c; Future  Ischemic cardiomyopathy -     Lipid panel; Future  Dyslipidemia (high LDL; low HDL) -     Lipid panel; Future  Encounter for monitoring testosterone  replacement therapy -     Testosterone ; Future  Testicular hypofunction -     Testosterone ; Future  Other orders -     Nurtec; Take 1 tablet (75 mg total) by mouth every other day.  Dispense: 16 tablet; Refill: 2 -     Methylphenidate  HCl; Take 1 tablet (20 mg total) by mouth 3 (three) times daily for 15 days.  Dispense: 45 tablet; Refill: 0     Return in about 6 months (around 01/15/2025) for physical.    Toribio MARLA Slain, MD

## 2024-07-18 NOTE — Patient Instructions (Signed)

## 2024-07-19 ENCOUNTER — Other Ambulatory Visit: Payer: Self-pay

## 2024-07-19 NOTE — Telephone Encounter (Signed)
 Can you call the patient and ask him if he meant to request this?  He did not mention it in his appointment yesterday, and it looks like it was ordered 3 days ago.

## 2024-07-22 ENCOUNTER — Other Ambulatory Visit

## 2024-07-29 ENCOUNTER — Ambulatory Visit: Admitting: Family Medicine

## 2024-07-29 ENCOUNTER — Encounter: Payer: Self-pay | Admitting: Oncology

## 2024-07-29 ENCOUNTER — Inpatient Hospital Stay: Payer: Self-pay

## 2024-07-29 ENCOUNTER — Inpatient Hospital Stay (HOSPITAL_BASED_OUTPATIENT_CLINIC_OR_DEPARTMENT_OTHER): Payer: Self-pay | Admitting: Oncology

## 2024-07-29 ENCOUNTER — Other Ambulatory Visit: Payer: Self-pay

## 2024-07-29 VITALS — BP 126/85 | HR 67 | Temp 97.9°F | Resp 18

## 2024-07-29 VITALS — BP 124/86 | HR 79 | Temp 98.3°F | Resp 16 | Wt 215.8 lb

## 2024-07-29 DIAGNOSIS — C20 Malignant neoplasm of rectum: Secondary | ICD-10-CM

## 2024-07-29 DIAGNOSIS — G8929 Other chronic pain: Secondary | ICD-10-CM | POA: Diagnosis not present

## 2024-07-29 DIAGNOSIS — Z5111 Encounter for antineoplastic chemotherapy: Secondary | ICD-10-CM | POA: Diagnosis not present

## 2024-07-29 DIAGNOSIS — M792 Neuralgia and neuritis, unspecified: Secondary | ICD-10-CM

## 2024-07-29 LAB — CMP (CANCER CENTER ONLY)
ALT: 26 U/L (ref 0–44)
AST: 33 U/L (ref 15–41)
Albumin: 3.7 g/dL (ref 3.5–5.0)
Alkaline Phosphatase: 159 U/L — ABNORMAL HIGH (ref 38–126)
Anion gap: 10 (ref 5–15)
BUN: 11 mg/dL (ref 8–23)
CO2: 26 mmol/L (ref 22–32)
Calcium: 9.6 mg/dL (ref 8.9–10.3)
Chloride: 102 mmol/L (ref 98–111)
Creatinine: 0.76 mg/dL (ref 0.61–1.24)
GFR, Estimated: >60 mL/min (ref >60)
Glucose, Bld: 135 mg/dL — ABNORMAL HIGH (ref 70–99)
Potassium: 4 mmol/L (ref 3.5–5.1)
Sodium: 138 mmol/L (ref 135–145)
Total Bilirubin: 0.4 mg/dL (ref 0.0–1.2)
Total Protein: 6.5 g/dL (ref 6.5–8.1)

## 2024-07-29 LAB — CBC WITH DIFFERENTIAL (CANCER CENTER ONLY)
Abs Immature Granulocytes: 0.83 K/uL — ABNORMAL HIGH (ref 0.00–0.07)
Basophils Absolute: 0.1 K/uL (ref 0.0–0.1)
Basophils Relative: 1 %
Eosinophils Absolute: 0 K/uL (ref 0.0–0.5)
Eosinophils Relative: 0 %
HCT: 31 % — ABNORMAL LOW (ref 39.0–52.0)
Hemoglobin: 10.1 g/dL — ABNORMAL LOW (ref 13.0–17.0)
Immature Granulocytes: 7 %
Lymphocytes Relative: 7 %
Lymphs Abs: 0.9 K/uL (ref 0.7–4.0)
MCH: 31.9 pg (ref 26.0–34.0)
MCHC: 32.6 g/dL (ref 30.0–36.0)
MCV: 97.8 fL (ref 80.0–100.0)
Monocytes Absolute: 1.1 K/uL — ABNORMAL HIGH (ref 0.1–1.0)
Monocytes Relative: 9 %
Neutro Abs: 9.7 K/uL — ABNORMAL HIGH (ref 1.7–7.7)
Neutrophils Relative %: 76 %
Platelet Count: 95 K/uL — ABNORMAL LOW (ref 150–400)
RBC: 3.17 MIL/uL — ABNORMAL LOW (ref 4.22–5.81)
RDW: 23.2 % — ABNORMAL HIGH (ref 11.5–15.5)
Smear Review: NORMAL
WBC Count: 12.7 K/uL — ABNORMAL HIGH (ref 4.0–10.5)
nRBC: 0.6 % — ABNORMAL HIGH (ref 0.0–0.2)

## 2024-07-29 LAB — TOTAL PROTEIN, URINE DIPSTICK: Protein, ur: NEGATIVE mg/dL

## 2024-07-29 MED ORDER — DEXTROSE 5 % IV SOLN: INTRAVENOUS | Status: DC

## 2024-07-29 MED ORDER — OXALIPLATIN CHEMO INJECTION 100 MG/20ML
43.0000 mg/m2 | Freq: Once | INTRAVENOUS | Status: AC
  Administered 2024-07-29: 100 mg via INTRAVENOUS
  Filled 2024-07-29: qty 20

## 2024-07-29 MED ORDER — SODIUM CHLORIDE 0.9 % IV SOLN
2400.0000 mg/m2 | INTRAVENOUS | Status: DC
  Administered 2024-07-29: 5000 mg via INTRAVENOUS
  Filled 2024-07-29: qty 100

## 2024-07-29 MED ORDER — LEUCOVORIN CALCIUM INJECTION 350 MG
400.0000 mg/m2 | Freq: Once | INTRAVENOUS | Status: AC
  Administered 2024-07-29: 880 mg via INTRAVENOUS
  Filled 2024-07-29: qty 44

## 2024-07-29 MED ORDER — SODIUM CHLORIDE 0.9 % IV SOLN: INTRAVENOUS | Status: DC

## 2024-07-29 MED ORDER — SODIUM CHLORIDE 0.9 % IV SOLN
5.0000 mg/kg | Freq: Once | INTRAVENOUS | Status: AC
  Administered 2024-07-29: 500 mg via INTRAVENOUS
  Filled 2024-07-29: qty 16

## 2024-07-29 MED ORDER — PALONOSETRON HCL INJECTION 0.25 MG/5ML
0.2500 mg | Freq: Once | INTRAVENOUS | Status: AC
  Administered 2024-07-29: 0.25 mg via INTRAVENOUS
  Filled 2024-07-29: qty 5

## 2024-07-29 MED ORDER — DEXAMETHASONE SOD PHOSPHATE PF 10 MG/ML IJ SOLN
10.0000 mg | Freq: Once | INTRAMUSCULAR | Status: AC
  Administered 2024-07-29: 10 mg via INTRAVENOUS

## 2024-07-29 NOTE — Progress Notes (Signed)
 [facility]  ONCOLOGY CLINIC PROGRESS NOTE   Patient Care Team: Chandra Toribio POUR, MD as PCP - General (Family Medicine) Thukkani, Arun K, MD as PCP - Cardiology (Cardiology) Babs Arthea DASEN, MD as Consulting Physician (Physical Medicine and Rehabilitation) Debby Fidela CROME, NP as Nurse Practitioner (Physical Medicine and Rehabilitation) Lennard Lesta FALCON, MD as Consulting Physician (Gastroenterology)  PATIENT NAME: Derrick Johnson   MR#: [id number] DOB: 1955-12-31  Date of visit: 07/29/2024   ASSESSMENT & PLAN:   [name] is a 68 y.o. gentleman with a past medical history of rectal cancer diagnosed in February 2021, treated with concurrent chemoradiation using Xeloda , completed treatment in April 2021, patient did not follow-up with surgery or with oncology departments after this. 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 He was referred back to our clinic by his PCP to reestablish care for his history of rectal cancer.  He has recurrence of disease with biopsy-proven lung mets.  Adenocarcinoma of rectum [facility]) Please review oncology history for additional details and timeline of events.  Patient was originally diagnosed with rectal cancer in 2021 and was treated with concurrent chemoradiation with Xeloda  with eventual plans for surgery.  He was lost to follow-up after that.  Restaging CT chest abdomen pelvis on 02/08/2024 suggested multiple lung nodules, concerning for metastatic recurrence.  On 02/27/2024, Dr. Shelah performed flexible video fiberoptic bronchoscopy with robotic assistance and biopsies of right upper lobe lung nodule.  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  Immunostains were positive for CK20 and CDX2, negative for CK7 and TTF-1, consistent with a colorectal  primary.  cTx,cNx,pM1a, Stage IV A disease.  MSI stable.  NGS testing on the specimen showed no actionable mutations.  All treatment options are palliative in nature, given stage IV disease.    He had an appointment on 03/15/2024 with cardiothoracic surgery Dr. Shyrl for possible resection of the lung lesions:  he likely is out of his radiation window to undergo rectal cancer resection. If this cannot be resected there is minimal advantage to resect the pulmonary nodules   Palliative systemic treatment with FOLFOX started from 03/28/2024.  Avastin  was added from cycle 2 onwards.   Tolerating chemotherapy reasonably well overall with some expected side effects including mouth sores.  Symptoms controlled with supportive therapy including mouth rinses with baking soda and salt.  We prescribed Magic mouthwash previously.  Following completion of 6 cycles of FOLFOX plus Avastin , restaging CT chest, abdomen and pelvis on 06/14/2024 showed significant improvement in lung nodules.  Nonspecific 11 mm hypointense nodular focus in the anterior segment 5 of the liver may reflect focal fatty sparing, although hepatic metastasis could not be ruled out.  Patient cannot undergo MRIs.  Hence plan to monitor this.  Plan to continue current management. Because of progressive mucositis, skin changes, mouth sores, we will start skipping 5-FU bolus dose from cycle 8 onwards (07/02/2024).   Due for cycle 10 of chemotherapy today.  Labs reveal thrombocytopenia with platelet count of 95,000.  We will dose reduce oxaliplatin  by 50% and proceed with cycle 10 of chemotherapy today.  Will continue to skip 5-FU bolus.  Since white count is 12,700 today, we will skip Neulasta  with current cycle.  Going forward, we will plan to dose reduce oxaliplatin  by 25%, in case of persistent neuropathy.  Plan to continue  current regimen and repeat imaging in approximately 3 months from the last scan.   RTC in 2 weeks for labs,  office visit and cycle 11 of chemotherapy.  Chronic neuropathic pain He had pre-existing neuropathy which is slowly getting worse.  Previous gabapentin  use was ineffective and caused adverse effects.   Experiencing tingling, numbness, and cold sensitivity, particularly affecting daily activities such as dressing. Symptoms are attributed to oxaliplatin . He has started Lyrica  but has not noticed significant improvement yet. - Continue Lyrica  for neuropathy management. - Monitor symptoms; will consider dose reduction of oxaliplatin  if symptoms worsen.  Chemotherapy-induced thrombocytopenia Platelet count is low at 95, likely due to chemotherapy. This necessitates a reduction in chemotherapy dosage to prevent further suppression of platelet production. - Reduced oxaliplatin  dosage due to low platelet count. - Continue to monitor platelet count and adjust treatment as needed.  Chemotherapy-induced oral mucositis and skin changes of hands Experiencing mouth sores and skin changes on hands, attributed to chemotherapy. Using moisturizers and lotions to manage symptoms. Symptoms are improving but still present. - Continue using moisturizers and lotions for skin care. - Monitor symptoms; will consider further management if symptoms worsen.  Chemotherapy-induced anemia Hemoglobin level has been staying low, likely due to chemotherapy.  Stable at 10.1 today.  No signs of blood loss reported. Anemia may contribute to fatigue and difficulty breathing. - Monitor hemoglobin levels; will consider transfusion and dose reduction if hemoglobin approaches 8.  Chronic pain syndrome Managed with methadone , prescribed by [facility]. He requires a refill of methadone . - Refilled methadone  prescription previously.  Attention deficit disorder, currently treated Managed with Ritalin , taken three times daily. He is experiencing difficulty managing medication schedule due to ADD. - Continue Ritalin  as  prescribed.  History of myocardial infarction Previous myocardial infarction noted. Discussion of potential heart risks with chemotherapy, but no current contraindications for treatment. - Monitor for any cardiac symptoms during chemotherapy   I reviewed lab results and outside records for this visit and discussed relevant results with the patient. Diagnosis, plan of care and treatment options were also discussed in detail with the patient. Opportunity provided to ask questions and answers provided to his apparent satisfaction. Provided instructions to call our clinic with any problems, questions or concerns prior to return visit. I recommended to continue follow-up with PCP and sub-specialists. He verbalized understanding and agreed with the plan.   NCCN guidelines have been consulted in the planning of this patient's care.  I spent a total of 42 minutes during this encounter with the patient including review of chart and various tests results, discussions about plan of care and coordination of care plan.   [name], MD  07/29/2024 12:13 PM  [facility] - A DEPT OF [name] [id number] [place] [address] Dept: [phone] Dept Fax: [phone]    CHIEF COMPLAINT/ REASON FOR VISIT:   Rectal cancer, initially diagnosed and treated in 2021 with concurrent chemoradiation using Xeloda .  Lost to follow-up.  Now with metastatic recurrence in the lungs, biopsy-proven in June 2025.  Current Treatment: Palliative systemic treatment with FOLFOX started from 03/28/2024.  Avastin  was added from cycle 2 onwards.   INTERVAL HISTORY:    Discussed the use of AI scribe software for clinical note transcription with the patient, who gave verbal consent to proceed.  History of Present Illness Randen Kauth is a 68 year old male who presents for follow-up regarding chemotherapy  side effects.  He experiences  significant bone pain following chemotherapy injections, which was severe for one day and accompanied by a fever that briefly exceeded 102F. The fever was managed with ice packs and cold drinks, subsiding within 30 minutes. No cough or diarrhea was present during this episode.  He takes methadone  four times a day for pain management, describing it as long-acting. Tylenol  has been helpful for pain associated with the chemotherapy shot. He takes Claritin for a few days post-shot but did not notice any difference in symptoms.  Recent blood work showed a white blood cell count of 12,700, platelet count of 95,000 (previously 189,000), and hemoglobin of 10.1 (previously 9.8).  He experiences tingling and numbness, for which he started Lyrica  (pregabalin ) about a month ago, taking it once daily in the morning without significant improvement. He also deals with recurrent mouth sores and uses moisturizer for his hands and feet, having used four tubs of cream.   I have reviewed the past medical history, past surgical history, social history and family history with the patient and they are unchanged from previous note.  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  2 small perirectal nodes measuring 3 to 4 mm   Lower EUS 11/13/2019-tumor in the proximal rectum.  Proximal posterolateral rectal mass.  2 abnormal lymph nodes in the perirectal region.  Staged T3N1 by endorectal ultrasound.    Previously he was seen by Dr. Cloretta in our clinic.   Xeloda /radiation 11/25/2019-01/01/2020   He was supposed to undergo surgical resection under the direction of Dr. Debby in June 2021.  Patient apparently had dental infections around that time and needed  dental extractions and patient deferred surgery and did not seek follow-up care either with the surgeons or with our department.  He also did not have follow-up appointments with gastroenterology.   His PCP referred him back to his and he reestablished care with us  on 01/30/2024.  CEA was normal at 1.68.  CBCD and CMP were unremarkable.  Plan made for restaging CT scan of the chest, abdomen and pelvis and referral sent to GI for endoscopic evaluation.  Patient cannot get MRIs because of nerve stimulator in place.   On 02/08/2024, restaging CT chest, abdomen and pelvis showed multiple lung nodules, largest 1 measuring 1.9 cm, concerning for pulmonary metastatic disease.  Previously seen circumferential superior rectal mass was no longer distinctly visible.  No evidence of lymphadenopathy or metastatic disease in the abdomen or pelvis.   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  No evidence of metastatic adenopathy in the chest or evidence of other metastatic disease in the abdomen or pelvis.  On 02/27/2024, Dr. Shelah performed flexible video fiberoptic bronchoscopy with robotic assistance and biopsies of right upper lobe lung nodule.  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  Immunostains were positive for CK20 and CDX2, negative for CK7 and TTF-1, consistent with a colorectal primary.  cTx,cNx,pM1a, Stage IV A disease.   All treatment options are palliative in nature, given stage IV disease.    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  MSI stable.  Overall no actionable mutations.  He had  an appointment on 03/15/2024 with cardiothoracic surgery Dr. Shyrl for possible resection of the lung lesions:  he likely is out of his radiation window to undergo rectal cancer resection. If this cannot be resected there  is minimal advantage to resect the pulmonary nodules   Palliative systemic treatment with FOLFOX started from 03/28/2024.  Avastin  was added from cycle 2 onwards.   Following completion of 6 cycles of FOLFOX plus Avastin , restaging CT chest, abdomen and pelvis on 06/14/2024 showed significant improvement in lung nodules.  Nonspecific 11 mm hypointense nodular focus in the anterior segment 5 of the liver may reflect focal fatty sparing, although hepatic metastasis could not be ruled out.  Patient cannot undergo MRIs.  Hence plan to monitor this.  Plan to continue current management.  Because of progressive mucositis, skin changes, mouth sores, we started skipping 5-FU bolus dose from cycle 8 onwards (07/02/2024).  If progressive neuropathy is noted, we will start cutting down oxaliplatin  dose.  Oncology History  Adenocarcinoma of rectum (HCC)  11/07/2019 Initial Diagnosis   Adenocarcinoma of rectum (HCC)   03/12/2024 Cancer Staging   Staging form: Colon and Rectum, AJCC 8th Edition - Clinical stage from 03/12/2024: Stage IVA (rcTX, rcNX, rpM1a) - Signed by Autumn Millman, MD on 03/12/2024 Stage prefix: Recurrence   03/28/2024 -  Chemotherapy   Patient is on Treatment Plan : COLORECTAL FOLFOX + Bevacizumab  q14d         REVIEW OF SYSTEMS:   Review of Systems - Oncology  All other pertinent systems were reviewed with the patient and are negative.  ALLERGIES: He is allergic to latex.  MEDICATIONS:  Current Outpatient Medications  Medication Sig Dispense Refill   albuterol  (PROVENTIL ) (2.5 MG/3ML) 0.083% nebulizer solution Take 2.5 mg by nebulization every 6 (six) hours as needed for wheezing or shortness of breath.      albuterol  (VENTOLIN  HFA) 108 (90 Base) MCG/ACT inhaler Inhale 1-2 puffs into the lungs every 6 (six) hours as needed for wheezing or shortness of breath. 1 Inhaler 5   aspirin  81 MG chewable tablet Chew 1 tablet (81 mg total) by mouth daily. 90 tablet 3   atorvastatin   (LIPITOR) 80 MG tablet Take 1 tablet (80 mg total) by mouth daily. 90 tablet 3   clopidogrel  (PLAVIX ) 75 MG tablet Take 1 tablet (75 mg total) by mouth daily. 90 tablet 1   cyclobenzaprine (FLEXERIL) 10 MG tablet TAKE 1 TABLET BY MOUTH EVERY 8 HOURS AS NEEDED FOR PAIN OR SPASMS. 60 tablet 0   dexamethasone  (DECADRON ) 4 MG tablet TAKE 2 TABLETS (8 MG TOTAL) BY MOUTH DAILY. START THE DAY AFTER CHEMOTHERAPY FOR 2 DAYS. TAKE WITH FOOD. 30 tablet 1   furosemide  (LASIX ) 20 MG tablet Take 1 tablet (20 mg total) by mouth daily. 90 tablet 3   lidocaine -prilocaine  (EMLA ) cream Apply to affected area once 30 g 3   linaclotide  (LINZESS ) 145 MCG CAPS capsule Take 1 capsule (145 mcg total) by mouth daily before breakfast. 30 capsule 3   magic mouthwash (nystatin , lidocaine , diphenhydrAMINE, alum & mag hydroxide) suspension Take 5 mLs by mouth every 4 (four) hours as needed for mouth pain. 500 mL 6   methadone  (DOLOPHINE ) 10 MG tablet Take 1 tablet (10 mg total) by mouth in the morning, at noon, in the evening, and at bedtime. 120 tablet 0   methylphenidate  (RITALIN ) 20 MG tablet Take 1 tablet (20 mg total) by mouth 3 (three) times daily for 15 days. 45 tablet 0   metoprolol   succinate (TOPROL -XL) 25 MG 24 hr tablet Take 1 tablet (25 mg total) by mouth at bedtime.     nitroGLYCERIN  (NITROSTAT ) 0.4 MG SL tablet Place 1 tablet (0.4 mg total) under the tongue every 5 (five) minutes x 3 doses as needed for chest pain. 25 tablet 3   ondansetron  (ZOFRAN ) 8 MG tablet TAKE 1 TABLET BY MOUTH EVERY 8 HOURS AS NEEDED FOR NAUSEA OR VOMITING. START ON THE THIRD DAY AFTER CHEMOTHERAPY. 30 tablet 1   polyethylene glycol (MIRALAX) 17 g packet Take 17 g by mouth daily.     pregabalin  (LYRICA ) 50 MG capsule Take 1 capsule (50 mg total) by mouth daily. 30 capsule 3   prochlorperazine  (COMPAZINE ) 10 MG tablet TAKE 1 TABLET BY MOUTH EVERY 6 HOURS AS NEEDED FOR NAUSEA OR VOMITING. 30 tablet 1   Rimegepant Sulfate (NURTEC) 75 MG TBDP Take  1 tablet (75 mg total) by mouth every other day. 16 tablet 2   sacubitril -valsartan  (ENTRESTO ) 24-26 MG Take 1 tablet by mouth 2 (two) times daily. 180 tablet 3   spironolactone  (ALDACTONE ) 25 MG tablet Take 0.5 tablets (12.5 mg total) by mouth daily. 45 tablet 3   SUMAtriptan  (IMITREX ) 100 MG tablet TAKE 1 TABLET BY MOUTH AS NEEDED, MAY REPEAT IN 2 HOURS AS NEEDED IF HEADACHE PERSISTS OR REOCCURS. MAX 2 PER 24 HOURS. MAX 2 DAYS PER WEEK 12 tablet 1   tamsulosin (FLOMAX) 0.4 MG CAPS capsule TAKE 1 TO 2 CAPSULES BY MOUTH AT BEDTIME WITH MEAL FOR PROSTATE. **DUE FOR OFFICE VISIT 04/17/24** 180 capsule 0   traZODone  (DESYREL ) 150 MG tablet Take 0.5 tablets (75 mg total) by mouth at bedtime as needed for sleep.     VITAMIN D -VITAMIN K PO Take 1 capsule by mouth daily. 10,000IU/ 100 mcg     Wheat Dextrin (BENEFIBER PO) Take 1 tablet by mouth 3 (three) times daily as needed (Constipation per wating report).     No current facility-administered medications for this visit.   Facility-Administered Medications Ordered in Other Visits  Medication Dose Route Frequency Provider Last Rate Last Admin   clindamycin  (CLEOCIN ) 900 mg in dextrose  5 % 50 mL IVPB  900 mg Intravenous 60 min Pre-Op Debby Hila, MD       And   gentamicin  (GARAMYCIN ) 5 mg/kg in dextrose  5 % 50 mL IVPB  5 mg/kg Intravenous 60 min Pre-Op Debby Hila, MD         VITALS:   Blood pressure 124/86, pulse 79, temperature 98.3 F (36.8 C), temperature source Temporal, resp. rate 16, weight 215 lb 12.8 oz (97.9 kg), SpO2 100%.  Wt Readings from Last 3 Encounters:  07/29/24 215 lb 12.8 oz (97.9 kg)  07/18/24 219 lb 1.9 oz (99.4 kg)  07/16/24 219 lb 9.6 oz (99.6 kg)    Body mass index is 31.87 kg/m.    Onc Performance Status - 07/29/24 1127       ECOG Perf Status   ECOG Perf Status Ambulatory and capable of all selfcare but unable to carry out any work activities.  Up and about more than 50% of waking hours      KPS SCALE    KPS % SCORE Cares for self, unable to carry on normal activity or to do active work            PHYSICAL EXAM:   Physical Exam Constitutional:      General: He is not in acute distress.    Appearance: Normal appearance.  HENT:  Head: Normocephalic and atraumatic.  Eyes:     Conjunctiva/sclera: Conjunctivae normal.  Cardiovascular:     Rate and Rhythm: Normal rate and regular rhythm.  Pulmonary:     Effort: Pulmonary effort is normal. No respiratory distress.  Abdominal:     General: There is no distension.  Neurological:     General: No focal deficit present.     Mental Status: He is alert and oriented to person, place, and time.  Psychiatric:        Mood and Affect: Mood normal.        Behavior: Behavior normal.      LABORATORY DATA:   I have reviewed the data as listed.  Results for orders placed or performed in visit on 07/29/24  Total Protein, Urine dipstick  Result Value Ref Range   Protein, ur NEGATIVE NEGATIVE mg/dL  CMP (Cancer Center only)  Result Value Ref Range   Sodium 138 135 - 145 mmol/L   Potassium 4.0 3.5 - 5.1 mmol/L   Chloride 102 98 - 111 mmol/L   CO2 26 22 - 32 mmol/L   Glucose, Bld 135 (H) 70 - 99 mg/dL   BUN 11 8 - 23 mg/dL   Creatinine 9.23 9.38 - 1.24 mg/dL   Calcium  9.6 8.9 - 10.3 mg/dL   Total Protein 6.5 6.5 - 8.1 g/dL   Albumin 3.7 3.5 - 5.0 g/dL   AST 33 15 - 41 U/L   ALT 26 0 - 44 U/L   Alkaline Phosphatase 159 (H) 38 - 126 U/L   Total Bilirubin 0.4 0.0 - 1.2 mg/dL   GFR, Estimated >39 >39 mL/min   Anion gap 10 5 - 15  CBC with Differential (Cancer Center Only)  Result Value Ref Range   WBC Count 12.7 (H) 4.0 - 10.5 K/uL   RBC 3.17 (L) 4.22 - 5.81 MIL/uL   Hemoglobin 10.1 (L) 13.0 - 17.0 g/dL   HCT 68.9 (L) 60.9 - 47.9 %   MCV 97.8 80.0 - 100.0 fL   MCH 31.9 26.0 - 34.0 pg   MCHC 32.6 30.0 - 36.0 g/dL   RDW 76.7 (H) 88.4 - 84.4 %   Platelet Count 95 (L) 150 - 400 K/uL   nRBC 0.6 (H) 0.0 - 0.2 %   Neutrophils  Relative % 76 %   Neutro Abs 9.7 (H) 1.7 - 7.7 K/uL   Lymphocytes Relative 7 %   Lymphs Abs 0.9 0.7 - 4.0 K/uL   Monocytes Relative 9 %   Monocytes Absolute 1.1 (H) 0.1 - 1.0 K/uL   Eosinophils Relative 0 %   Eosinophils Absolute 0.0 0.0 - 0.5 K/uL   Basophils Relative 1 %   Basophils Absolute 0.1 0.0 - 0.1 K/uL   WBC Morphology MORPHOLOGY UNREMARKABLE    RBC Morphology MORPHOLOGY UNREMARKABLE    Smear Review Normal platelet morphology    Immature Granulocytes 7 %   Abs Immature Granulocytes 0.83 (H) 0.00 - 0.07 K/uL        RADIOGRAPHIC STUDIES:  CT CHEST ABDOMEN PELVIS W CONTRAST CLINICAL DATA:  Restaging stage IV rectal cancer. * Tracking Code: BO *  EXAM: CT CHEST, ABDOMEN, AND PELVIS WITH CONTRAST  TECHNIQUE: Multidetector CT imaging of the chest, abdomen and pelvis was performed following the standard protocol during bolus administration of intravenous contrast.  RADIATION DOSE REDUCTION: This exam was performed according to the departmental dose-optimization program which includes automated exposure control, adjustment of the mA and/or kV according to patient size and/or  use of iterative reconstruction technique.  CONTRAST:  OMNIPAQUE  IOHEXOL  300 MG/ML  SOLN  COMPARISON:  PET-CT February 23, 2024 and CT February 08, 2024  FINDINGS: CT CHEST FINDINGS  Cardiovascular: Accessed right chest Port-A-Cath with tip in the right atrium. Three-vessel coronary artery calcifications. Normal size heart. No significant pericardial effusion/thickening.  Mediastinum/Nodes: No suspicious thyroid  nodule. No pathologically enlarged mediastinal, hilar or axillary lymph nodes. The esophagus is grossly unremarkable.  Lungs/Pleura: Decreased size of the bilateral pulmonary nodules. For reference:  -right upper lobe pulmonary nodule on image 40/7 is now predominantly a thin walled cyst measuring 7 mm with a dominant soft tissue nodular focus along the lateral margin measuring 3  mm on image 40/7 previously this was a solid nodule measuring 19 x 17 mm.  -left anterior upper lobe pulmonary nodule on image 62/7 demonstrates a similar features with a cystic component measuring 8 mm and the dominant nodular component measuring 2 mm on image 62/7 previously this was a solid nodule measuring 13 x 12 mm.  No new suspicious pulmonary nodules or masses.  Musculoskeletal: No aggressive lytic or blastic lesion of bone. Intrathecal spinal stimulator lead.  CT ABDOMEN PELVIS FINDINGS  Hepatobiliary: New hypodense nodular focus in anterior segment V measuring 11 mm on image 65/2. Gallbladder surgically absent. No biliary ductal dilation.  Pancreas: No pancreatic ductal dilation or evidence of acute inflammation.  Spleen: No splenomegaly.  Adrenals/Urinary Tract: No suspicious adrenal nodule/mass. No hydronephrosis. Kidneys demonstrate symmetric enhancement. Urinary bladder is unremarkable for degree of distension.  Stomach/Bowel: Stomach is distended with ingested material and gas without focal wall thickening. No pathologic dilation of small or large bowel. No evidence of acute bowel inflammation.  No discrete rectal mass or asymmetric wall thickening identified.  Vascular/Lymphatic: Normal caliber abdominal aorta. Smooth IVC contours. The portal, splenic and superior mesenteric veins are patent. No pathologically enlarged abdominal or pelvic lymph nodes.  Reproductive: Uterus and bilateral adnexa are unremarkable.  Other: No significant abdominopelvic free fluid.  Musculoskeletal: No aggressive lytic or blastic lesion of bone. Degenerative change of the bilateral hips.  IMPRESSION: 1. New hypodense nodular focus in anterior segment V measuring 11 mm, may reflect focal fatty sparing although a hepatic metastasis is a pertinent differential consideration. Suggest more definitive assessment by abdominal MRI with and without contrast. 2. Decreased size of  the bilateral pulmonary nodules, consistent with treatment response. 3. No discrete rectal mass or asymmetric wall thickening identified.  Electronically Signed   By: Reyes Holder M.D.   On: 06/17/2024 08:04    CODE STATUS:  Code Status History     Date Active Date Inactive Code Status Order ID Comments User Context   11/27/2023 2206 11/29/2023 1747 Full Code [id number]  Cesario Chamorro, MD Inpatient    Questions for Most Recent Historical Code Status (Order [id number])     Question Answer   By: Consent: discussion documented in EHR            Orders Placed This Encounter  Procedures   CBC with Differential (Cancer Center Only)    Standing Status:   Future    Expected Date:   08/26/2024    Expiration Date:   08/26/2025   CMP (Cancer Center only)    Standing Status:   Future    Expected Date:   08/26/2024    Expiration Date:   08/26/2025   Total Protein, Urine dipstick    Standing Status:   Future    Expected Date:   08/26/2024  Expiration Date:   08/26/2025     Future Appointments  Date Time Provider Department Center  07/31/2024  3:00 PM [facility] None  08/12/2024  8:30 AM [facility] None  08/12/2024  8:45 AM Arin Vanosdol, MD [facility] None  08/12/2024  9:45 AM [facility] None  08/14/2024  1:45 PM [facility] None  01/14/2025  8:45 AM [facility]  01/21/2025  9:30 AM Chandra Toribio POUR, MD [facility]    This document was completed utilizing engineer, civil (consulting). Grammatical errors, random word insertions, pronoun errors, and incomplete sentences are an occasional consequence of this system due to software limitations, ambient noise, and hardware issues. Any formal questions or concerns about the content, text or information contained within the body of this dictation should be directly addressed to the provider for clarification.

## 2024-07-29 NOTE — Progress Notes (Signed)
 Patient seen by Dr. Chinita Pasam today  Vitals are within treatment parameters:Yes   Labs are within treatment parameters: No (Please specify and give further instructions.)   PLT:  95  Treatment plan has been signed: Yes   Per physician team, Patient is ready for treatment. Please note the following modifications:   Per Dr. Autumn: proceed with chemotherapy today with the following changes due to low platelets  today: oxaliplatin  dose reduced to 50%. Aslo, skip Neulasta  injection with current cycle because of mild leukocytosis from last Neulasta  injection.

## 2024-07-29 NOTE — Patient Instructions (Signed)

## 2024-07-29 NOTE — Patient Instructions (Signed)
 [facility] A DEPT OF [facility]  Discharge Instructions: Thank you for choosing [facility] to provide your oncology and hematology care.   If you have a lab appointment with the Cancer Center, please go directly to the Cancer Center and check in at the registration area.   Wear comfortable clothing and clothing appropriate for easy access to any Portacath or PICC line.   We strive to give you quality time with your provider. You may need to reschedule your appointment if you arrive late (15 or more minutes).  Arriving late affects you and other patients whose appointments are after yours.  Also, if you miss three or more appointments without notifying the office, you may be dismissed from the clinic at the provider's discretion.      For prescription refill requests, have your pharmacy contact our office and allow 72 hours for refills to be completed.    Today you received the following chemotherapy and/or immunotherapy agents: bevacizumab, oxaliplatin, leucovorin, fluorouracil      To help prevent nausea and vomiting after your treatment, we encourage you to take your nausea medication as directed.  BELOW ARE SYMPTOMS THAT SHOULD BE REPORTED IMMEDIATELY: *FEVER GREATER THAN 100.4 F (38 C) OR HIGHER *CHILLS OR SWEATING *NAUSEA AND VOMITING THAT IS NOT CONTROLLED WITH YOUR NAUSEA MEDICATION *UNUSUAL SHORTNESS OF BREATH *UNUSUAL BRUISING OR BLEEDING *URINARY PROBLEMS (pain or burning when urinating, or frequent urination) *BOWEL PROBLEMS (unusual diarrhea, constipation, pain near the anus) TENDERNESS IN MOUTH AND THROAT WITH OR WITHOUT PRESENCE OF ULCERS (sore throat, sores in mouth, or a toothache) UNUSUAL RASH, SWELLING OR PAIN  UNUSUAL VAGINAL DISCHARGE OR ITCHING   Items with * indicate a potential emergency and should be followed up as soon as possible or go to the Emergency Department if any problems should occur.  Please show the  CHEMOTHERAPY ALERT CARD or IMMUNOTHERAPY ALERT CARD at check-in to the Emergency Department and triage nurse.  Should you have questions after your visit or need to cancel or reschedule your appointment, please contact [facility] - A DEPT OF [facility]  Dept: [phone]  and follow the prompts.  Office hours are 8:00 a.m. to 4:30 p.m. Monday - Friday. Please note that voicemails left after 4:00 p.m. may not be returned until the following business day.  We are closed weekends and major holidays. You have access to a nurse at all times for urgent questions. Please call the main number to the clinic Dept: [phone] and follow the prompts.   For any non-urgent questions, you may also contact your provider using MyChart. We now offer e-Visits for anyone 38 and older to request care online for non-urgent symptoms. For details visit [email or web address].   Also download the MyChart app! Go to the app store, search "MyChart", open the app, select [facility], and log in with your MyChart username and password.

## 2024-07-29 NOTE — Assessment & Plan Note (Signed)
 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  Previous gabapentin  use was ineffective and caused adverse effects.   Experiencing tingling, numbness, and cold sensitivity, particularly affecting daily activities such as dressing. Symptoms are attributed to oxaliplatin . He has started Lyrica but has not noticed significant improvement yet. - Continue Lyrica for neuropathy management. - Monitor symptoms; will consider dose reduction of oxaliplatin  if symptoms worsen.

## 2024-07-29 NOTE — Assessment & Plan Note (Addendum)
 Please review oncology history for additional details and timeline of events.  Patient was originally diagnosed with rectal cancer in 2021 and was treated with concurrent chemoradiation with Xeloda  with eventual plans for surgery.  He was lost to follow-up after that.  Restaging CT chest abdomen pelvis on 02/08/2024 suggested multiple lung nodules, concerning for metastatic recurrence.  On 02/27/2024, Dr. Shelah performed flexible video fiberoptic bronchoscopy with robotic assistance and biopsies of right upper lobe lung nodule.  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  Immunostains were positive for CK20 and CDX2, negative for CK7 and TTF-1, consistent with a colorectal primary.  cTx,cNx,pM1a, Stage IV A disease.  MSI stable.  NGS testing on the specimen showed no actionable mutations.  All treatment options are palliative in nature, given stage IV disease.    He had an appointment on 03/15/2024 with cardiothoracic surgery Dr. Shyrl for possible resection of the lung lesions:  he likely is out of his radiation window to undergo rectal cancer resection. If this cannot be resected there is minimal advantage to resect the pulmonary nodules   Palliative systemic treatment with FOLFOX started from 03/28/2024.  Avastin  was added from cycle 2 onwards.   Tolerating chemotherapy reasonably well overall with some expected side effects including mouth sores.  Symptoms controlled with supportive therapy including mouth rinses with baking soda and salt.  We prescribed Magic mouthwash previously.  Following completion of 6 cycles of FOLFOX plus Avastin , restaging CT chest, abdomen and pelvis on 06/14/2024 showed significant improvement in lung nodules.  Nonspecific 11 mm hypointense nodular focus in the anterior segment 5 of the liver may reflect focal fatty sparing, although hepatic metastasis could not be ruled out.  Patient cannot undergo MRIs.  Hence plan to monitor this.  Plan to continue current  management. Because of progressive mucositis, skin changes, mouth sores, we will start skipping 5-FU bolus dose from cycle 8 onwards (07/02/2024).   Due for cycle 10 of chemotherapy today.  Labs reveal thrombocytopenia with platelet count of 95,000.  We will dose reduce oxaliplatin  by 50% and proceed with cycle 10 of chemotherapy today.  Will continue to skip 5-FU bolus.  Since white count is 12,700 today, we will skip Neulasta  with current cycle.  Going forward, we will plan to dose reduce oxaliplatin  by 25%, in case of persistent neuropathy.  Plan to continue current regimen and repeat imaging in approximately 3 months from the last scan.   RTC in 2 weeks for labs, office visit and cycle 11 of chemotherapy.

## 2024-07-31 ENCOUNTER — Inpatient Hospital Stay

## 2024-07-31 VITALS — BP 121/75 | HR 58 | Temp 98.1°F | Resp 18

## 2024-07-31 DIAGNOSIS — Z5111 Encounter for antineoplastic chemotherapy: Secondary | ICD-10-CM | POA: Diagnosis not present

## 2024-07-31 DIAGNOSIS — C20 Malignant neoplasm of rectum: Secondary | ICD-10-CM

## 2024-07-31 NOTE — Patient Instructions (Signed)

## 2024-08-05 ENCOUNTER — Other Ambulatory Visit: Payer: Self-pay | Admitting: Family Medicine

## 2024-08-12 ENCOUNTER — Encounter: Payer: Self-pay | Admitting: Oncology

## 2024-08-12 ENCOUNTER — Inpatient Hospital Stay: Admitting: Oncology

## 2024-08-12 ENCOUNTER — Inpatient Hospital Stay

## 2024-08-12 ENCOUNTER — Inpatient Hospital Stay: Admitting: Nutrition

## 2024-08-12 ENCOUNTER — Inpatient Hospital Stay: Attending: Oncology

## 2024-08-12 VITALS — BP 130/87 | HR 87 | Temp 98.1°F | Resp 17 | Wt 212.5 lb

## 2024-08-12 VITALS — BP 138/85 | HR 84 | Temp 98.3°F | Resp 18

## 2024-08-12 DIAGNOSIS — D6481 Anemia due to antineoplastic chemotherapy: Secondary | ICD-10-CM | POA: Diagnosis not present

## 2024-08-12 DIAGNOSIS — D72829 Elevated white blood cell count, unspecified: Secondary | ICD-10-CM | POA: Diagnosis not present

## 2024-08-12 DIAGNOSIS — C20 Malignant neoplasm of rectum: Secondary | ICD-10-CM

## 2024-08-12 DIAGNOSIS — K1230 Oral mucositis (ulcerative), unspecified: Secondary | ICD-10-CM | POA: Diagnosis not present

## 2024-08-12 DIAGNOSIS — R197 Diarrhea, unspecified: Secondary | ICD-10-CM | POA: Insufficient documentation

## 2024-08-12 DIAGNOSIS — I2510 Atherosclerotic heart disease of native coronary artery without angina pectoris: Secondary | ICD-10-CM | POA: Diagnosis not present

## 2024-08-12 DIAGNOSIS — Z7952 Long term (current) use of systemic steroids: Secondary | ICD-10-CM | POA: Insufficient documentation

## 2024-08-12 DIAGNOSIS — D696 Thrombocytopenia, unspecified: Secondary | ICD-10-CM | POA: Diagnosis not present

## 2024-08-12 DIAGNOSIS — G8929 Other chronic pain: Secondary | ICD-10-CM

## 2024-08-12 DIAGNOSIS — C7800 Secondary malignant neoplasm of unspecified lung: Secondary | ICD-10-CM | POA: Insufficient documentation

## 2024-08-12 DIAGNOSIS — R5383 Other fatigue: Secondary | ICD-10-CM | POA: Insufficient documentation

## 2024-08-12 DIAGNOSIS — M792 Neuralgia and neuritis, unspecified: Secondary | ICD-10-CM

## 2024-08-12 DIAGNOSIS — G43909 Migraine, unspecified, not intractable, without status migrainosus: Secondary | ICD-10-CM | POA: Diagnosis not present

## 2024-08-12 DIAGNOSIS — I252 Old myocardial infarction: Secondary | ICD-10-CM | POA: Diagnosis not present

## 2024-08-12 DIAGNOSIS — Z7902 Long term (current) use of antithrombotics/antiplatelets: Secondary | ICD-10-CM | POA: Insufficient documentation

## 2024-08-12 DIAGNOSIS — Z5111 Encounter for antineoplastic chemotherapy: Secondary | ICD-10-CM | POA: Insufficient documentation

## 2024-08-12 DIAGNOSIS — R110 Nausea: Secondary | ICD-10-CM | POA: Diagnosis not present

## 2024-08-12 DIAGNOSIS — Z79891 Long term (current) use of opiate analgesic: Secondary | ICD-10-CM | POA: Insufficient documentation

## 2024-08-12 DIAGNOSIS — Z923 Personal history of irradiation: Secondary | ICD-10-CM | POA: Diagnosis not present

## 2024-08-12 DIAGNOSIS — T451X5A Adverse effect of antineoplastic and immunosuppressive drugs, initial encounter: Secondary | ICD-10-CM | POA: Diagnosis not present

## 2024-08-12 DIAGNOSIS — R933 Abnormal findings on diagnostic imaging of other parts of digestive tract: Secondary | ICD-10-CM | POA: Diagnosis not present

## 2024-08-12 DIAGNOSIS — G620 Drug-induced polyneuropathy: Secondary | ICD-10-CM | POA: Diagnosis not present

## 2024-08-12 DIAGNOSIS — Z7982 Long term (current) use of aspirin: Secondary | ICD-10-CM | POA: Diagnosis not present

## 2024-08-12 DIAGNOSIS — G894 Chronic pain syndrome: Secondary | ICD-10-CM | POA: Insufficient documentation

## 2024-08-12 DIAGNOSIS — K1379 Other lesions of oral mucosa: Secondary | ICD-10-CM | POA: Diagnosis not present

## 2024-08-12 DIAGNOSIS — Z79899 Other long term (current) drug therapy: Secondary | ICD-10-CM | POA: Insufficient documentation

## 2024-08-12 DIAGNOSIS — Z9221 Personal history of antineoplastic chemotherapy: Secondary | ICD-10-CM | POA: Diagnosis not present

## 2024-08-12 LAB — CBC WITH DIFFERENTIAL (CANCER CENTER ONLY)
Abs Immature Granulocytes: 0.04 K/uL (ref 0.00–0.07)
Basophils Absolute: 0 K/uL (ref 0.0–0.1)
Basophils Relative: 1 %
Eosinophils Absolute: 0.1 K/uL (ref 0.0–0.5)
Eosinophils Relative: 2 %
HCT: 34.7 % — ABNORMAL LOW (ref 39.0–52.0)
Hemoglobin: 11.5 g/dL — ABNORMAL LOW (ref 13.0–17.0)
Immature Granulocytes: 1 %
Lymphocytes Relative: 15 %
Lymphs Abs: 1 K/uL (ref 0.7–4.0)
MCH: 31.9 pg (ref 26.0–34.0)
MCHC: 33.1 g/dL (ref 30.0–36.0)
MCV: 96.4 fL (ref 80.0–100.0)
Monocytes Absolute: 1.1 K/uL — ABNORMAL HIGH (ref 0.1–1.0)
Monocytes Relative: 18 %
Neutro Abs: 4.1 K/uL (ref 1.7–7.7)
Neutrophils Relative %: 63 %
Platelet Count: 212 K/uL (ref 150–400)
RBC: 3.6 MIL/uL — ABNORMAL LOW (ref 4.22–5.81)
RDW: 20.1 % — ABNORMAL HIGH (ref 11.5–15.5)
WBC Count: 6.4 K/uL (ref 4.0–10.5)
nRBC: 0 % (ref 0.0–0.2)

## 2024-08-12 LAB — CMP (CANCER CENTER ONLY)
ALT: 41 U/L (ref 0–44)
AST: 39 U/L (ref 15–41)
Albumin: 4 g/dL (ref 3.5–5.0)
Alkaline Phosphatase: 123 U/L (ref 38–126)
Anion gap: 9 (ref 5–15)
BUN: 10 mg/dL (ref 8–23)
CO2: 27 mmol/L (ref 22–32)
Calcium: 9.6 mg/dL (ref 8.9–10.3)
Chloride: 103 mmol/L (ref 98–111)
Creatinine: 0.88 mg/dL (ref 0.61–1.24)
GFR, Estimated: >60 mL/min (ref >60)
Glucose, Bld: 127 mg/dL — ABNORMAL HIGH (ref 70–99)
Potassium: 3.8 mmol/L (ref 3.5–5.1)
Sodium: 139 mmol/L (ref 135–145)
Total Bilirubin: 0.4 mg/dL (ref 0.0–1.2)
Total Protein: 6.8 g/dL (ref 6.5–8.1)

## 2024-08-12 LAB — TOTAL PROTEIN, URINE DIPSTICK: Protein, ur: NEGATIVE mg/dL

## 2024-08-12 MED ORDER — LEUCOVORIN CALCIUM INJECTION 350 MG
400.0000 mg/m2 | Freq: Once | INTRAVENOUS | Status: AC
  Administered 2024-08-12: 880 mg via INTRAVENOUS
  Filled 2024-08-12: qty 44

## 2024-08-12 MED ORDER — PALONOSETRON HCL INJECTION 0.25 MG/5ML
0.2500 mg | Freq: Once | INTRAVENOUS | Status: AC
  Administered 2024-08-12: 0.25 mg via INTRAVENOUS
  Filled 2024-08-12: qty 5

## 2024-08-12 MED ORDER — SODIUM CHLORIDE 0.9 % IV SOLN
2400.0000 mg/m2 | INTRAVENOUS | Status: DC
  Administered 2024-08-12: 5000 mg via INTRAVENOUS
  Filled 2024-08-12: qty 100

## 2024-08-12 MED ORDER — OXALIPLATIN CHEMO INJECTION 100 MG/20ML
68.0000 mg/m2 | Freq: Once | INTRAVENOUS | Status: AC
  Administered 2024-08-12: 150 mg via INTRAVENOUS
  Filled 2024-08-12: qty 20

## 2024-08-12 MED ORDER — DEXAMETHASONE SOD PHOSPHATE PF 10 MG/ML IJ SOLN
10.0000 mg | Freq: Once | INTRAMUSCULAR | Status: AC
  Administered 2024-08-12: 10 mg via INTRAVENOUS

## 2024-08-12 MED ORDER — SODIUM CHLORIDE 0.9 % IV SOLN
5.0000 mg/kg | Freq: Once | INTRAVENOUS | Status: AC
  Administered 2024-08-12: 500 mg via INTRAVENOUS
  Filled 2024-08-12: qty 16

## 2024-08-12 MED ORDER — DEXTROSE 5 % IV SOLN: INTRAVENOUS | Status: DC

## 2024-08-12 MED ORDER — SODIUM CHLORIDE 0.9 % IV SOLN: INTRAVENOUS | Status: DC

## 2024-08-12 NOTE — Progress Notes (Signed)
 [facility]  ONCOLOGY CLINIC PROGRESS NOTE   Patient Care Team: Chandra Toribio POUR, MD as PCP - General (Family Medicine) Thukkani, Arun K, MD as PCP - Cardiology (Cardiology) Babs Arthea DASEN, MD as Consulting Physician (Physical Medicine and Rehabilitation) Debby Fidela CROME, NP as Nurse Practitioner (Physical Medicine and Rehabilitation) Lennard Lesta FALCON, MD as Consulting Physician (Gastroenterology)  PATIENT NAME: Derrick Johnson   MR#: [id number] DOB: 04/20/1956  Date of visit: 08/12/2024   ASSESSMENT & PLAN:   RUBEL HECKARD is a 68 y.o. gentleman with a past medical history of rectal cancer diagnosed in February 2021, treated with concurrent chemoradiation using Xeloda , completed treatment in April 2021, patient did not follow-up with surgery or with oncology departments after this. 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 He was referred back to our clinic by his PCP to reestablish care for his history of rectal cancer.  He has recurrence of disease with biopsy-proven lung mets.  Adenocarcinoma of rectum [facility]) Please review oncology history for additional details and timeline of events.  Patient was originally diagnosed with rectal cancer in 2021 and was treated with concurrent chemoradiation with Xeloda  with eventual plans for surgery.  He was lost to follow-up after that.  Restaging CT chest abdomen pelvis on 02/08/2024 suggested multiple lung nodules, concerning for metastatic recurrence.  On 02/27/2024, Dr. Shelah performed flexible video fiberoptic bronchoscopy with robotic assistance and biopsies of right upper lobe lung nodule.  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  Immunostains were positive for CK20 and CDX2, negative for CK7 and TTF-1, consistent with a colorectal  primary.  cTx,cNx,pM1a, Stage IV A disease.  MSI stable.  NGS testing on the specimen showed no actionable mutations.  All treatment options are palliative in nature, given stage IV disease.    He had an appointment on 03/15/2024 with cardiothoracic surgery Dr. Shyrl for possible resection of the lung lesions:  he likely is out of his radiation window to undergo rectal cancer resection. If this cannot be resected there is minimal advantage to resect the pulmonary nodules   Palliative systemic treatment with FOLFOX started from 03/28/2024.  Avastin  was added from cycle 2 onwards.   Tolerating chemotherapy reasonably well overall with some expected side effects including mouth sores.  Symptoms controlled with supportive therapy including mouth rinses with baking soda and salt.  We prescribed Magic mouthwash previously.  Following completion of 6 cycles of FOLFOX plus Avastin , restaging CT chest, abdomen and pelvis on 06/14/2024 showed significant improvement in lung nodules.  Nonspecific 11 mm hypointense nodular focus in the anterior segment 5 of the liver may reflect focal fatty sparing, although hepatic metastasis could not be ruled out.  Patient cannot undergo MRIs.  Hence plan to monitor this.  Plan to continue current management. Because of progressive mucositis, skin changes, mouth sores, we will start skipping 5-FU bolus dose from cycle 8 onwards (07/02/2024).  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  Will proceed with 25% dose reduction going forward.  Due for cycle 11 of chemotherapy today.  Platelet count is now normal.  Will continue with dose reduced oxaliplatin .  Will continue to skip 5-FU bolus.  Since white count is 6400 today, we will proceed with Neulasta  with this cycle.  Plan to continue current regimen and repeat  imaging in approximately 3 months from the last scan.   RTC in 2 weeks for labs, office  visit and cycle 12 of chemotherapy.  Following that plan is to obtain restaging imaging, followed by maintenance treatments with 5-FU plus Avastin   Chronic neuropathic pain He had pre-existing neuropathy which is slowly getting worse.  Previous gabapentin  use was ineffective and caused adverse effects.   Experiencing tingling, numbness, and cold sensitivity, particularly affecting daily activities such as dressing. Symptoms are attributed to oxaliplatin . He has started Lyrica  but has not noticed significant improvement yet. - Continue Lyrica  for neuropathy management. - Monitor symptoms; will consider dose reduction of oxaliplatin  if symptoms worsen.  Migraine Chronic migraines managed with Imitrex  for 30 years. Current episode is debilitating, requiring bed rest for two to three days. - Continue Imitrex  as needed for migraine management.  Chemotherapy-induced oral mucositis and skin changes of hands Experiencing mouth sores and skin changes on hands, attributed to chemotherapy. Using moisturizers and lotions to manage symptoms. Symptoms are improving but still present. - Continue using moisturizers and lotions for skin care. - Monitor symptoms; will consider further management if symptoms worsen.  Chemotherapy-induced anemia Hemoglobin level has been staying low, likely due to chemotherapy.  Stable at 11.5 today.  No signs of blood loss reported. Anemia may contribute to fatigue and difficulty breathing. - Monitor hemoglobin levels; will consider transfusion and dose reduction if hemoglobin approaches 8.  Chronic pain syndrome Managed with methadone , prescribed by [facility]. - Refilled methadone  prescription previously.  Attention deficit disorder, currently treated Managed with Ritalin , taken three times daily. He is experiencing difficulty managing medication schedule due to ADD. - Continue Ritalin  as prescribed.  History of myocardial infarction Previous myocardial  infarction noted. Discussion of potential heart risks with chemotherapy, but no current contraindications for treatment. - Monitor for any cardiac symptoms during chemotherapy   I reviewed lab results and outside records for this visit and discussed relevant results with the patient. Diagnosis, plan of care and treatment options were also discussed in detail with the patient. Opportunity provided to ask questions and answers provided to his apparent satisfaction. Provided instructions to call our clinic with any problems, questions or concerns prior to return visit. I recommended to continue follow-up with PCP and sub-specialists. He verbalized understanding and agreed with the plan.   NCCN guidelines have been consulted in the planning of this patient's care.  I spent a total of 43 minutes during this encounter with the patient including review of chart and various tests results, discussions about plan of care and coordination of care plan.  [name], MD  08/12/2024 5:17 PM  [facility] - A DEPT OF [name] [id number] [place] [address] Dept: [phone] Dept Fax: [phone]    CHIEF COMPLAINT/ REASON FOR VISIT:   Rectal cancer, initially diagnosed and treated in 2021 with concurrent chemoradiation using Xeloda .  Lost to follow-up.  Now with metastatic recurrence in the lungs, biopsy-proven in June 2025.  Current Treatment: Palliative systemic treatment with FOLFOX started from 03/28/2024.  Avastin  was added from cycle 2 onwards.   INTERVAL HISTORY:    Discussed the use of AI scribe software for clinical note transcription with the patient, who gave verbal consent to proceed.  History of Present Illness Andreus Cure is a 68 year old male undergoing chemotherapy who presents with fatigue and mouth sores.  He is currently on his eleventh cycle  of chemotherapy and reports feeling 'wiped  out' with significant fatigue over the past two weeks. This has led to increased sleep and decreased appetite, resulting in a three-pound weight loss. He also has sores in his mouth, which prevent him from wearing dentures and eating properly.  He has a long-standing history of migraines, managed with Imitrex  for thirty years. His migraines are debilitating, often confining him to bed for two to three days. He has taken two doses of Imitrex  this morning to manage his symptoms.  He experiences chest tightness when standing up quickly, which is relieved by sitting down. He describes it as a sensation of tightness that makes it difficult to breathe, distinct from his asthma symptoms.  He manages pain with Tylenol  and methadone , describing it as 'manageable but not ideal'. He uses Lyrica  for tingling and numbness associated with his treatment.  No diarrhea or loose stools, but he feels generally fatigued.   I have reviewed the past medical history, past surgical history, social history and family history with the patient and they are unchanged from previous note.  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  2 small perirectal nodes measuring 3 to 4 mm   Lower EUS 11/13/2019-tumor in the proximal rectum.  Proximal posterolateral rectal mass.  2 abnormal lymph nodes in the perirectal region.  Staged T3N1 by endorectal ultrasound.    Previously he was seen by Dr. Cloretta in our clinic.   Xeloda /radiation 11/25/2019-01/01/2020   He was supposed to undergo surgical resection under the direction of Dr. Debby in June 2021.  Patient apparently had dental infections around that time and needed dental extractions and patient deferred surgery and  did not seek follow-up care either with the surgeons or with our department.  He also did not have follow-up appointments with gastroenterology.   His PCP referred him back to his and he reestablished care with us  on 01/30/2024.  CEA was normal at 1.68.  CBCD and CMP were unremarkable.  Plan made for restaging CT scan of the chest, abdomen and pelvis and referral sent to GI for endoscopic evaluation.  Patient cannot get MRIs because of nerve stimulator in place.   On 02/08/2024, restaging CT chest, abdomen and pelvis showed multiple lung nodules, largest 1 measuring 1.9 cm, concerning for pulmonary metastatic disease.  Previously seen circumferential superior rectal mass was no longer distinctly visible.  No evidence of lymphadenopathy or metastatic disease in the abdomen or pelvis.   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  No evidence of metastatic adenopathy in the chest or evidence of other metastatic disease in the abdomen or pelvis.  On 02/27/2024, Dr. Shelah performed flexible video fiberoptic bronchoscopy with robotic assistance and biopsies of right upper lobe lung nodule.  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  Immunostains were positive for CK20 and CDX2, negative for CK7 and TTF-1, consistent with a colorectal primary.  cTx,cNx,pM1a, Stage IV A disease.   All treatment options are palliative in nature, given stage IV disease.    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  MSI stable.  Overall no actionable mutations.  He had an appointment on 03/15/2024  with cardiothoracic surgery Dr. Shyrl for possible resection of the lung lesions:  he likely is out of his radiation window to undergo rectal cancer resection. If this cannot be resected there is minimal advantage to resect the pulmonary  nodules   Palliative systemic treatment with FOLFOX started from 03/28/2024.  Avastin  was added from cycle 2 onwards.   Following completion of 6 cycles of FOLFOX plus Avastin , restaging CT chest, abdomen and pelvis on 06/14/2024 showed significant improvement in lung nodules.  Nonspecific 11 mm hypointense nodular focus in the anterior segment 5 of the liver may reflect focal fatty sparing, although hepatic metastasis could not be ruled out.  Patient cannot undergo MRIs.  Hence plan to monitor this.  Plan to continue current management.  Because of progressive mucositis, skin changes, mouth sores, we started skipping 5-FU bolus dose from cycle 8 onwards (07/02/2024).  If progressive neuropathy is noted, we will start cutting down oxaliplatin  dose.  Oncology History  Adenocarcinoma of rectum (HCC)  11/07/2019 Initial Diagnosis   Adenocarcinoma of rectum (HCC)   03/12/2024 Cancer Staging   Staging form: Colon and Rectum, AJCC 8th Edition - Clinical stage from 03/12/2024: Stage IVA (rcTX, rcNX, rpM1a) - Signed by Autumn Millman, MD on 03/12/2024 Stage prefix: Recurrence   03/28/2024 -  Chemotherapy   Patient is on Treatment Plan : COLORECTAL FOLFOX + Bevacizumab  q14d         REVIEW OF SYSTEMS:   Review of Systems - Oncology  All other pertinent systems were reviewed with the patient and are negative.  ALLERGIES: He is allergic to latex.  MEDICATIONS:  Current Outpatient Medications  Medication Sig Dispense Refill   albuterol  (PROVENTIL ) (2.5 MG/3ML) 0.083% nebulizer solution Take 2.5 mg by nebulization every 6 (six) hours as needed for wheezing or shortness of breath.      albuterol  (VENTOLIN  HFA) 108 (90 Base) MCG/ACT inhaler Inhale 1-2 puffs into the lungs every 6 (six) hours as needed for wheezing or shortness of breath. 1 Inhaler 5   aspirin  81 MG chewable tablet Chew 1 tablet (81 mg total) by mouth daily. 90 tablet 3   atorvastatin  (LIPITOR) 80 MG tablet Take 1 tablet (80 mg total)  by mouth daily. 90 tablet 3   clopidogrel  (PLAVIX ) 75 MG tablet Take 1 tablet (75 mg total) by mouth daily. 90 tablet 1   cyclobenzaprine (FLEXERIL) 10 MG tablet TAKE 1 TABLET BY MOUTH EVERY 8 HOURS AS NEEDED FOR PAIN OR SPASMS. 60 tablet 0   dexamethasone  (DECADRON ) 4 MG tablet TAKE 2 TABLETS (8 MG TOTAL) BY MOUTH DAILY. START THE DAY AFTER CHEMOTHERAPY FOR 2 DAYS. TAKE WITH FOOD. 30 tablet 1   furosemide  (LASIX ) 20 MG tablet Take 1 tablet (20 mg total) by mouth daily. 90 tablet 3   lidocaine -prilocaine  (EMLA ) cream Apply to affected area once 30 g 3   linaclotide  (LINZESS ) 145 MCG CAPS capsule Take 1 capsule (145 mcg total) by mouth daily before breakfast. 30 capsule 3   magic mouthwash (nystatin , lidocaine , diphenhydrAMINE, alum & mag hydroxide) suspension Take 5 mLs by mouth every 4 (four) hours as needed for mouth pain. 500 mL 6   methadone  (DOLOPHINE ) 10 MG tablet Take 1 tablet (10 mg total) by mouth in the morning, at noon, in the evening, and at bedtime. 120 tablet 0   methylphenidate  (RITALIN ) 20 MG tablet Take 1 tablet (20 mg total) by mouth 3 (three) times daily for 15 days. 45 tablet 0   metoprolol  succinate (TOPROL -XL) 25 MG  24 hr tablet TAKE 1 TABLET BY MOUTH DAILY 100 tablet 3   nitroGLYCERIN  (NITROSTAT ) 0.4 MG SL tablet Place 1 tablet (0.4 mg total) under the tongue every 5 (five) minutes x 3 doses as needed for chest pain. 25 tablet 3   ondansetron  (ZOFRAN ) 8 MG tablet TAKE 1 TABLET BY MOUTH EVERY 8 HOURS AS NEEDED FOR NAUSEA OR VOMITING. START ON THE THIRD DAY AFTER CHEMOTHERAPY. 30 tablet 1   polyethylene glycol (MIRALAX) 17 g packet Take 17 g by mouth daily.     pregabalin  (LYRICA ) 50 MG capsule Take 1 capsule (50 mg total) by mouth daily. 30 capsule 3   prochlorperazine  (COMPAZINE ) 10 MG tablet TAKE 1 TABLET BY MOUTH EVERY 6 HOURS AS NEEDED FOR NAUSEA OR VOMITING. 30 tablet 1   Rimegepant Sulfate (NURTEC) 75 MG TBDP Take 1 tablet (75 mg total) by mouth every other day. 16 tablet  2   sacubitril -valsartan  (ENTRESTO ) 24-26 MG Take 1 tablet by mouth 2 (two) times daily. 180 tablet 3   spironolactone  (ALDACTONE ) 25 MG tablet Take 0.5 tablets (12.5 mg total) by mouth daily. 45 tablet 3   SUMAtriptan  (IMITREX ) 100 MG tablet TAKE 1 TABLET BY MOUTH AS NEEDED, MAY REPEAT IN 2 HOURS AS NEEDED IF HEADACHE PERSISTS OR REOCCURS. MAX 2 PER 24 HOURS. MAX 2 DAYS PER WEEK 12 tablet 1   tamsulosin (FLOMAX) 0.4 MG CAPS capsule TAKE 1 TO 2 CAPSULES BY MOUTH AT BEDTIME WITH MEAL FOR PROSTATE. **DUE FOR OFFICE VISIT 04/17/24** 180 capsule 0   traZODone  (DESYREL ) 150 MG tablet Take 0.5 tablets (75 mg total) by mouth at bedtime as needed for sleep.     VITAMIN D -VITAMIN K PO Take 1 capsule by mouth daily. 10,000IU/ 100 mcg     Wheat Dextrin (BENEFIBER PO) Take 1 tablet by mouth 3 (three) times daily as needed (Constipation per wating report).     No current facility-administered medications for this visit.   Facility-Administered Medications Ordered in Other Visits  Medication Dose Route Frequency Provider Last Rate Last Admin   0.9 %  sodium chloride  infusion   Intravenous Continuous Torion Hulgan, MD   Stopped at 08/12/24 1135   clindamycin  (CLEOCIN ) 900 mg in dextrose  5 % 50 mL IVPB  900 mg Intravenous 60 min Pre-Op Debby Hila, MD       And   gentamicin  (GARAMYCIN ) 5 mg/kg in dextrose  5 % 50 mL IVPB  5 mg/kg Intravenous 60 min Pre-Op Debby Hila, MD       dextrose  5 % solution   Intravenous Continuous Audwin Semper, MD   Stopped at 08/12/24 1345   fluorouracil  (ADRUCIL ) 5,000 mg in sodium chloride  0.9 % 150 mL chemo infusion  2,400 mg/m2 (Treatment Plan Recorded) Intravenous 1 day or 1 dose Gerry Blanchfield, MD   Infusion Verify at 08/12/24 1613     VITALS:   Blood pressure 130/87, pulse 87, temperature 98.1 F (36.7 C), temperature source Temporal, resp. rate 17, weight 212 lb 8 oz (96.4 kg), SpO2 98%.  Wt Readings from Last 3 Encounters:  08/12/24 212 lb 8 oz (96.4 kg)   07/29/24 215 lb 12.8 oz (97.9 kg)  07/18/24 219 lb 1.9 oz (99.4 kg)    Body mass index is 31.38 kg/m.    Onc Performance Status - 08/12/24 0913       ECOG Perf Status   ECOG Perf Status Ambulatory and capable of all selfcare but unable to carry out any work activities.  Up and about  more than 50% of waking hours      KPS SCALE   KPS % SCORE Cares for self, unable to carry on normal activity or to do active work             PHYSICAL EXAM:   Physical Exam Constitutional:      General: He is not in acute distress.    Appearance: Normal appearance.  HENT:     Head: Normocephalic and atraumatic.  Eyes:     Conjunctiva/sclera: Conjunctivae normal.  Cardiovascular:     Rate and Rhythm: Normal rate and regular rhythm.  Pulmonary:     Effort: Pulmonary effort is normal. No respiratory distress.  Abdominal:     General: There is no distension.  Neurological:     General: No focal deficit present.     Mental Status: He is alert and oriented to person, place, and time.  Psychiatric:        Mood and Affect: Mood normal.        Behavior: Behavior normal.      LABORATORY DATA:   I have reviewed the data as listed.  Results for orders placed or performed in visit on 08/12/24  CBC with Differential (Cancer Center Only)  Result Value Ref Range   WBC Count 6.4 4.0 - 10.5 K/uL   RBC 3.60 (L) 4.22 - 5.81 MIL/uL   Hemoglobin 11.5 (L) 13.0 - 17.0 g/dL   HCT 65.2 (L) 60.9 - 47.9 %   MCV 96.4 80.0 - 100.0 fL   MCH 31.9 26.0 - 34.0 pg   MCHC 33.1 30.0 - 36.0 g/dL   RDW 79.8 (H) 88.4 - 84.4 %   Platelet Count 212 150 - 400 K/uL   nRBC 0.0 0.0 - 0.2 %   Neutrophils Relative % 63 %   Neutro Abs 4.1 1.7 - 7.7 K/uL   Lymphocytes Relative 15 %   Lymphs Abs 1.0 0.7 - 4.0 K/uL   Monocytes Relative 18 %   Monocytes Absolute 1.1 (H) 0.1 - 1.0 K/uL   Eosinophils Relative 2 %   Eosinophils Absolute 0.1 0.0 - 0.5 K/uL   Basophils Relative 1 %   Basophils Absolute 0.0 0.0 - 0.1  K/uL   Immature Granulocytes 1 %   Abs Immature Granulocytes 0.04 0.00 - 0.07 K/uL  CMP (Cancer Center only)  Result Value Ref Range   Sodium 139 135 - 145 mmol/L   Potassium 3.8 3.5 - 5.1 mmol/L   Chloride 103 98 - 111 mmol/L   CO2 27 22 - 32 mmol/L   Glucose, Bld 127 (H) 70 - 99 mg/dL   BUN 10 8 - 23 mg/dL   Creatinine 9.11 9.38 - 1.24 mg/dL   Calcium  9.6 8.9 - 10.3 mg/dL   Total Protein 6.8 6.5 - 8.1 g/dL   Albumin 4.0 3.5 - 5.0 g/dL   AST 39 15 - 41 U/L   ALT 41 0 - 44 U/L   Alkaline Phosphatase 123 38 - 126 U/L   Total Bilirubin 0.4 0.0 - 1.2 mg/dL   GFR, Estimated >39 >39 mL/min   Anion gap 9 5 - 15  Total Protein, Urine dipstick  Result Value Ref Range   Protein, ur NEGATIVE NEGATIVE mg/dL      RADIOGRAPHIC STUDIES:  CT CHEST ABDOMEN PELVIS W CONTRAST CLINICAL DATA:  Restaging stage IV rectal cancer. * Tracking Code: BO *  EXAM: CT CHEST, ABDOMEN, AND PELVIS WITH CONTRAST  TECHNIQUE: Multidetector CT imaging of the chest,  abdomen and pelvis was performed following the standard protocol during bolus administration of intravenous contrast.  RADIATION DOSE REDUCTION: This exam was performed according to the departmental dose-optimization program which includes automated exposure control, adjustment of the mA and/or kV according to patient size and/or use of iterative reconstruction technique.  CONTRAST:  OMNIPAQUE  IOHEXOL  300 MG/ML  SOLN  COMPARISON:  PET-CT February 23, 2024 and CT February 08, 2024  FINDINGS: CT CHEST FINDINGS  Cardiovascular: Accessed right chest Port-A-Cath with tip in the right atrium. Three-vessel coronary artery calcifications. Normal size heart. No significant pericardial effusion/thickening.  Mediastinum/Nodes: No suspicious thyroid  nodule. No pathologically enlarged mediastinal, hilar or axillary lymph nodes. The esophagus is grossly unremarkable.  Lungs/Pleura: Decreased size of the bilateral pulmonary nodules.  For reference:  -right upper lobe pulmonary nodule on image 40/7 is now predominantly a thin walled cyst measuring 7 mm with a dominant soft tissue nodular focus along the lateral margin measuring 3 mm on image 40/7 previously this was a solid nodule measuring 19 x 17 mm.  -left anterior upper lobe pulmonary nodule on image 62/7 demonstrates a similar features with a cystic component measuring 8 mm and the dominant nodular component measuring 2 mm on image 62/7 previously this was a solid nodule measuring 13 x 12 mm.  No new suspicious pulmonary nodules or masses.  Musculoskeletal: No aggressive lytic or blastic lesion of bone. Intrathecal spinal stimulator lead.  CT ABDOMEN PELVIS FINDINGS  Hepatobiliary: New hypodense nodular focus in anterior segment V measuring 11 mm on image 65/2. Gallbladder surgically absent. No biliary ductal dilation.  Pancreas: No pancreatic ductal dilation or evidence of acute inflammation.  Spleen: No splenomegaly.  Adrenals/Urinary Tract: No suspicious adrenal nodule/mass. No hydronephrosis. Kidneys demonstrate symmetric enhancement. Urinary bladder is unremarkable for degree of distension.  Stomach/Bowel: Stomach is distended with ingested material and gas without focal wall thickening. No pathologic dilation of small or large bowel. No evidence of acute bowel inflammation.  No discrete rectal mass or asymmetric wall thickening identified.  Vascular/Lymphatic: Normal caliber abdominal aorta. Smooth IVC contours. The portal, splenic and superior mesenteric veins are patent. No pathologically enlarged abdominal or pelvic lymph nodes.  Reproductive: Uterus and bilateral adnexa are unremarkable.  Other: No significant abdominopelvic free fluid.  Musculoskeletal: No aggressive lytic or blastic lesion of bone. Degenerative change of the bilateral hips.  IMPRESSION: 1. New hypodense nodular focus in anterior segment V measuring 11 mm, may  reflect focal fatty sparing although a hepatic metastasis is a pertinent differential consideration. Suggest more definitive assessment by abdominal MRI with and without contrast. 2. Decreased size of the bilateral pulmonary nodules, consistent with treatment response. 3. No discrete rectal mass or asymmetric wall thickening identified.  Electronically Signed   By: Reyes Holder M.D.   On: 06/17/2024 08:04    CODE STATUS:  Code Status History     Date Active Date Inactive Code Status Order ID Comments User Context   11/27/2023 2206 11/29/2023 1747 Full Code [id number]  Cesario Chamorro, MD Inpatient    Questions for Most Recent Historical Code Status (Order [id number])     Question Answer   By: Consent: discussion documented in EHR            Orders Placed This Encounter  Procedures   CBC with Differential (Cancer Center Only)    Standing Status:   Future    Expected Date:   09/09/2024    Expiration Date:   09/09/2025   CMP (Cancer Center only)  Standing Status:   Future    Expected Date:   09/09/2024    Expiration Date:   09/09/2025   Total Protein, Urine dipstick    Standing Status:   Future    Expected Date:   09/09/2024    Expiration Date:   09/09/2025     Future Appointments  Date Time Provider Department Center  08/14/2024 12:00 PM [facility] INFUSION [facility] None  08/26/2024  8:15 AM [facility] INFUSION [facility] None  08/26/2024  9:15 AM Britlee Skolnik, MD [facility] None  08/26/2024 10:15 AM [facility] INFUSION [facility] None  08/28/2024 11:45 AM [facility] FLUSH [facility] None  01/14/2025  8:45 AM [facility]  01/21/2025  9:30 AM Chandra Toribio POUR, MD [facility]    This document was completed utilizing engineer, civil (consulting). Grammatical errors, random word insertions, pronoun errors, and incomplete sentences are an occasional consequence of this system due to software limitations, ambient noise, and hardware issues. Any  formal questions or concerns about the content, text or information contained within the body of this dictation should be directly addressed to the provider for clarification.

## 2024-08-12 NOTE — Patient Instructions (Signed)
 [facility] A DEPT OF [facility]  Discharge Instructions: Thank you for choosing [facility] to provide your oncology and hematology care.   If you have a lab appointment with the Cancer Center, please go directly to the Cancer Center and check in at the registration area.   Wear comfortable clothing and clothing appropriate for easy access to any Portacath or PICC line.   We strive to give you quality time with your provider. You may need to reschedule your appointment if you arrive late (15 or more minutes).  Arriving late affects you and other patients whose appointments are after yours.  Also, if you miss three or more appointments without notifying the office, you may be dismissed from the clinic at the provider's discretion.      For prescription refill requests, have your pharmacy contact our office and allow 72 hours for refills to be completed.    Today you received the following chemotherapy and/or immunotherapy agents: bevacizumab, oxaliplatin, leucovorin, fluorouracil      To help prevent nausea and vomiting after your treatment, we encourage you to take your nausea medication as directed.  BELOW ARE SYMPTOMS THAT SHOULD BE REPORTED IMMEDIATELY: *FEVER GREATER THAN 100.4 F (38 C) OR HIGHER *CHILLS OR SWEATING *NAUSEA AND VOMITING THAT IS NOT CONTROLLED WITH YOUR NAUSEA MEDICATION *UNUSUAL SHORTNESS OF BREATH *UNUSUAL BRUISING OR BLEEDING *URINARY PROBLEMS (pain or burning when urinating, or frequent urination) *BOWEL PROBLEMS (unusual diarrhea, constipation, pain near the anus) TENDERNESS IN MOUTH AND THROAT WITH OR WITHOUT PRESENCE OF ULCERS (sore throat, sores in mouth, or a toothache) UNUSUAL RASH, SWELLING OR PAIN  UNUSUAL VAGINAL DISCHARGE OR ITCHING   Items with * indicate a potential emergency and should be followed up as soon as possible or go to the Emergency Department if any problems should occur.  Please show the  CHEMOTHERAPY ALERT CARD or IMMUNOTHERAPY ALERT CARD at check-in to the Emergency Department and triage nurse.  Should you have questions after your visit or need to cancel or reschedule your appointment, please contact [facility] - A DEPT OF [facility]  Dept: [phone]  and follow the prompts.  Office hours are 8:00 a.m. to 4:30 p.m. Monday - Friday. Please note that voicemails left after 4:00 p.m. may not be returned until the following business day.  We are closed weekends and major holidays. You have access to a nurse at all times for urgent questions. Please call the main number to the clinic Dept: [phone] and follow the prompts.   For any non-urgent questions, you may also contact your provider using MyChart. We now offer e-Visits for anyone 38 and older to request care online for non-urgent symptoms. For details visit [email or web address].   Also download the MyChart app! Go to the app store, search "MyChart", open the app, select [facility], and log in with your MyChart username and password.

## 2024-08-12 NOTE — Progress Notes (Signed)
 Nutrition follow up completed with patient during infusion for rectal cancer in 2021. He was treated with concurrent chemoradiation with Xeloda  with eventual plans for surgery. He was lost to follow-up after that.   Palliative systemic treatment with FOLFOX started from 03/28/2024. Avastin  was added from cycle 2 onwards.   Tolerating chemotherapy reasonably well overall with some expected side effects including mouth sores. Symptoms controlled with supportive therapy including mouth rinses with baking soda and salt. Patient prescribed Magic mouthwash previously.   Because of progressive mucositis, skin changes, mouth sores, we will start skipping 5-FU bolus dose from cycle 8 onwards (07/02/2024).   Weight continues to trend down and was documented as 212.5 pounds today from 219.6 pounds on November 11. Weight continues to fluctuate up and down. Patient reports nausea but no vomiting and is relieved with medications. Oral mucositis has improved. He has no other concerns.  Nutrition Diagnosis: Unintended weight loss continues  Intervention: Continue smaller meals and snacks. Take antiemetics as needed. Continue baking soda and salt water rinses for mouth sores. Continue Boost Plus 2 times daily as needed. Continue bowel regiment.  Monitoring, Evaluation, Goals: Tolerate adequate calories and protein to promote weight maintenance.  No follow up scheduled at this time. Please refer back to RD as needed.

## 2024-08-12 NOTE — Assessment & Plan Note (Addendum)
 Please review oncology history for additional details and timeline of events.  Patient was originally diagnosed with rectal cancer in 2021 and was treated with concurrent chemoradiation with Xeloda  with eventual plans for surgery.  He was lost to follow-up after that.  Restaging CT chest abdomen pelvis on 02/08/2024 suggested multiple lung nodules, concerning for metastatic recurrence.  On 02/27/2024, Dr. Shelah performed flexible video fiberoptic bronchoscopy with robotic assistance and biopsies of right upper lobe lung nodule.  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  Immunostains were positive for CK20 and CDX2, negative for CK7 and TTF-1, consistent with a colorectal primary.  cTx,cNx,pM1a, Stage IV A disease.  MSI stable.  NGS testing on the specimen showed no actionable mutations.  All treatment options are palliative in nature, given stage IV disease.    He had an appointment on 03/15/2024 with cardiothoracic surgery Dr. Shyrl for possible resection of the lung lesions:  he likely is out of his radiation window to undergo rectal cancer resection. If this cannot be resected there is minimal advantage to resect the pulmonary nodules   Palliative systemic treatment with FOLFOX started from 03/28/2024.  Avastin  was added from cycle 2 onwards.   Tolerating chemotherapy reasonably well overall with some expected side effects including mouth sores.  Symptoms controlled with supportive therapy including mouth rinses with baking soda and salt.  We prescribed Magic mouthwash previously.  Following completion of 6 cycles of FOLFOX plus Avastin , restaging CT chest, abdomen and pelvis on 06/14/2024 showed significant improvement in lung nodules.  Nonspecific 11 mm hypointense nodular focus in the anterior segment 5 of the liver may reflect focal fatty sparing, although hepatic metastasis could not be ruled out.  Patient cannot undergo MRIs.  Hence plan to monitor this.  Plan to continue current  management. Because of progressive mucositis, skin changes, mouth sores, we will start skipping 5-FU bolus dose from cycle 8 onwards (07/02/2024).  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  Will proceed with 25% dose reduction going forward.  Due for cycle 11 of chemotherapy today.  Platelet count is now normal.  Will continue with dose reduced oxaliplatin .  Will continue to skip 5-FU bolus.  Since white count is 6400 today, we will proceed with Neulasta  with this cycle.  Plan to continue current regimen and repeat imaging in approximately 3 months from the last scan.   RTC in 2 weeks for labs, office visit and cycle 12 of chemotherapy.  Following that plan is to obtain restaging imaging, followed by maintenance treatments with 5-FU plus Avastin 

## 2024-08-12 NOTE — Progress Notes (Signed)
 Patient seen by Dr. Archie Patten Pasam today  Vitals are within treatment parameters:Yes   Labs are within treatment parameters: Yes   Treatment plan has been signed: Yes   Per physician team, Patient is ready for treatment and there are NO modifications to the treatment plan.

## 2024-08-12 NOTE — Assessment & Plan Note (Signed)
 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  Previous gabapentin  use was ineffective and caused adverse effects.   Experiencing tingling, numbness, and cold sensitivity, particularly affecting daily activities such as dressing. Symptoms are attributed to oxaliplatin . He has started Lyrica but has not noticed significant improvement yet. - Continue Lyrica for neuropathy management. - Monitor symptoms; will consider dose reduction of oxaliplatin  if symptoms worsen.

## 2024-08-14 ENCOUNTER — Inpatient Hospital Stay

## 2024-08-14 VITALS — BP 121/76 | HR 65 | Temp 98.1°F | Resp 18

## 2024-08-14 DIAGNOSIS — C20 Malignant neoplasm of rectum: Secondary | ICD-10-CM

## 2024-08-14 DIAGNOSIS — Z5111 Encounter for antineoplastic chemotherapy: Secondary | ICD-10-CM | POA: Diagnosis not present

## 2024-08-14 MED ORDER — PEGFILGRASTIM INJECTION 6 MG/0.6ML ~~LOC~~
6.0000 mg | PREFILLED_SYRINGE | Freq: Once | SUBCUTANEOUS | Status: AC
  Administered 2024-08-14: 6 mg via SUBCUTANEOUS
  Filled 2024-08-14: qty 0.6

## 2024-08-14 NOTE — Patient Instructions (Signed)

## 2024-08-17 ENCOUNTER — Other Ambulatory Visit: Payer: Self-pay | Admitting: Family Medicine

## 2024-08-17 DIAGNOSIS — G894 Chronic pain syndrome: Secondary | ICD-10-CM

## 2024-08-17 DIAGNOSIS — M797 Fibromyalgia: Secondary | ICD-10-CM

## 2024-08-17 DIAGNOSIS — G950 Syringomyelia and syringobulbia: Secondary | ICD-10-CM

## 2024-08-17 DIAGNOSIS — Z79899 Other long term (current) drug therapy: Secondary | ICD-10-CM

## 2024-08-17 DIAGNOSIS — Z5181 Encounter for therapeutic drug level monitoring: Secondary | ICD-10-CM

## 2024-08-26 ENCOUNTER — Other Ambulatory Visit: Payer: Self-pay | Admitting: Oncology

## 2024-08-26 ENCOUNTER — Inpatient Hospital Stay

## 2024-08-26 ENCOUNTER — Encounter: Payer: Self-pay | Admitting: Oncology

## 2024-08-26 ENCOUNTER — Inpatient Hospital Stay (HOSPITAL_BASED_OUTPATIENT_CLINIC_OR_DEPARTMENT_OTHER): Admitting: Oncology

## 2024-08-26 ENCOUNTER — Other Ambulatory Visit: Payer: Self-pay

## 2024-08-26 VITALS — BP 127/81 | HR 71 | Temp 98.2°F | Resp 17

## 2024-08-26 VITALS — BP 131/84 | HR 84 | Temp 98.4°F | Resp 18 | Ht 69.0 in | Wt 213.2 lb

## 2024-08-26 DIAGNOSIS — C20 Malignant neoplasm of rectum: Secondary | ICD-10-CM

## 2024-08-26 DIAGNOSIS — Z5111 Encounter for antineoplastic chemotherapy: Secondary | ICD-10-CM | POA: Diagnosis not present

## 2024-08-26 LAB — CBC WITH DIFFERENTIAL (CANCER CENTER ONLY)
Abs Immature Granulocytes: 0.43 K/uL — ABNORMAL HIGH (ref 0.00–0.07)
Basophils Absolute: 0 K/uL (ref 0.0–0.1)
Basophils Relative: 0 %
Eosinophils Absolute: 0 K/uL (ref 0.0–0.5)
Eosinophils Relative: 0 %
HCT: 34.2 % — ABNORMAL LOW (ref 39.0–52.0)
Hemoglobin: 11.5 g/dL — ABNORMAL LOW (ref 13.0–17.0)
Immature Granulocytes: 3 %
Lymphocytes Relative: 4 %
Lymphs Abs: 0.7 K/uL (ref 0.7–4.0)
MCH: 32.7 pg (ref 26.0–34.0)
MCHC: 33.6 g/dL (ref 30.0–36.0)
MCV: 97.2 fL (ref 80.0–100.0)
Monocytes Absolute: 0.8 K/uL (ref 0.1–1.0)
Monocytes Relative: 5 %
Neutro Abs: 14.5 K/uL — ABNORMAL HIGH (ref 1.7–7.7)
Neutrophils Relative %: 88 %
Platelet Count: 125 K/uL — ABNORMAL LOW (ref 150–400)
RBC: 3.52 MIL/uL — ABNORMAL LOW (ref 4.22–5.81)
RDW: 18.6 % — ABNORMAL HIGH (ref 11.5–15.5)
WBC Count: 16.5 K/uL — ABNORMAL HIGH (ref 4.0–10.5)
nRBC: 0.1 % (ref 0.0–0.2)

## 2024-08-26 LAB — CMP (CANCER CENTER ONLY)
ALT: 77 U/L — ABNORMAL HIGH (ref 0–44)
AST: 82 U/L — ABNORMAL HIGH (ref 15–41)
Albumin: 4.4 g/dL (ref 3.5–5.0)
Alkaline Phosphatase: 176 U/L — ABNORMAL HIGH (ref 38–126)
Anion gap: 10 (ref 5–15)
BUN: 11 mg/dL (ref 8–23)
CO2: 25 mmol/L (ref 22–32)
Calcium: 9.6 mg/dL (ref 8.9–10.3)
Chloride: 105 mmol/L (ref 98–111)
Creatinine: 0.81 mg/dL (ref 0.61–1.24)
GFR, Estimated: >60 mL/min
Glucose, Bld: 142 mg/dL — ABNORMAL HIGH (ref 70–99)
Potassium: 4.2 mmol/L (ref 3.5–5.1)
Sodium: 140 mmol/L (ref 135–145)
Total Bilirubin: 0.5 mg/dL (ref 0.0–1.2)
Total Protein: 7.5 g/dL (ref 6.5–8.1)

## 2024-08-26 LAB — TOTAL PROTEIN, URINE DIPSTICK: Protein, ur: NEGATIVE mg/dL

## 2024-08-26 MED ORDER — SODIUM CHLORIDE 0.9 % IV SOLN
5.0000 mg/kg | Freq: Once | INTRAVENOUS | Status: AC
  Administered 2024-08-26: 500 mg via INTRAVENOUS
  Filled 2024-08-26: qty 16

## 2024-08-26 MED ORDER — PALONOSETRON HCL INJECTION 0.25 MG/5ML
0.2500 mg | Freq: Once | INTRAVENOUS | Status: AC
  Administered 2024-08-26: 0.25 mg via INTRAVENOUS
  Filled 2024-08-26: qty 5

## 2024-08-26 MED ORDER — SODIUM CHLORIDE 0.9 % IV SOLN
2400.0000 mg/m2 | INTRAVENOUS | Status: DC
  Administered 2024-08-26: 5000 mg via INTRAVENOUS
  Filled 2024-08-26: qty 100

## 2024-08-26 MED ORDER — DEXTROSE 5 % IV SOLN: INTRAVENOUS | Status: DC

## 2024-08-26 MED ORDER — LEUCOVORIN CALCIUM INJECTION 350 MG
400.0000 mg/m2 | Freq: Once | INTRAVENOUS | Status: AC
  Administered 2024-08-26: 880 mg via INTRAVENOUS
  Filled 2024-08-26: qty 44

## 2024-08-26 MED ORDER — OXALIPLATIN CHEMO INJECTION 100 MG/20ML
68.0000 mg/m2 | Freq: Once | INTRAVENOUS | Status: AC
  Administered 2024-08-26: 150 mg via INTRAVENOUS
  Filled 2024-08-26: qty 20

## 2024-08-26 MED ORDER — SODIUM CHLORIDE 0.9 % IV SOLN: INTRAVENOUS | Status: DC

## 2024-08-26 MED ORDER — DEXAMETHASONE SOD PHOSPHATE PF 10 MG/ML IJ SOLN
10.0000 mg | Freq: Once | INTRAMUSCULAR | Status: AC
  Administered 2024-08-26: 10 mg via INTRAVENOUS

## 2024-08-26 NOTE — Progress Notes (Signed)
 "  [facility]  ONCOLOGY CLINIC PROGRESS NOTE   Patient Care Team: Chandra Toribio POUR, MD as PCP - General (Family Medicine) Wendel Lurena POUR, MD as PCP - Cardiology (Cardiology) Babs Arthea DASEN, MD as Consulting Physician (Physical Medicine and Rehabilitation) Debby Fidela CROME, NP as Nurse Practitioner (Physical Medicine and Rehabilitation) Lennard Lesta FALCON, MD as Consulting Physician (Gastroenterology)  PATIENT NAME: Derrick [facility]   MR#: [id number] DOB: 25-Jul-1956  Date of visit: 08/26/2024   ASSESSMENT & PLAN:   Derrick [facility] is a 68 y.o. gentleman with a past medical history of rectal cancer diagnosed in February 2021, treated with concurrent chemoradiation using Xeloda , completed treatment in April 2021, patient did not follow-up with surgery or with oncology departments after this. 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 He was referred back to our clinic by his PCP to reestablish care for his history of rectal cancer.  He has recurrence of disease with biopsy-proven lung mets.  Adenocarcinoma of rectum [facility]) Please review oncology history for additional details and timeline of events.  Patient was originally diagnosed with rectal cancer in 2021 and was treated with concurrent chemoradiation with Xeloda  with eventual plans for surgery.  He was lost to follow-up after that.  Restaging CT chest abdomen pelvis on 02/08/2024 suggested multiple lung nodules, concerning for metastatic recurrence.  On 02/27/2024, Dr. Shelah performed flexible video fiberoptic bronchoscopy with robotic assistance and biopsies of right upper lobe lung nodule.  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  Immunostains were positive for CK20 and CDX2, negative for CK7 and TTF-1, consistent with a colorectal  primary.  cTx,cNx,pM1a, Stage IV A disease.  MSI stable.  NGS testing on the specimen showed no actionable mutations.  All treatment options are palliative in nature, given stage IV disease.    He had an appointment on 03/15/2024 with cardiothoracic surgery Dr. Shyrl for possible resection of the lung lesions:  he likely is out of his radiation window to undergo rectal cancer resection. If this cannot be resected there is minimal advantage to resect the pulmonary nodules   Palliative systemic treatment with FOLFOX started from 03/28/2024.  Avastin  was added from cycle 2 onwards.   Tolerating chemotherapy reasonably well overall with some expected side effects including mouth sores.  Symptoms controlled with supportive therapy including mouth rinses with baking soda and salt.  We prescribed Magic mouthwash previously.  Following completion of 6 cycles of FOLFOX plus Avastin , restaging CT chest, abdomen and pelvis on 06/14/2024 showed significant improvement in lung nodules.  Nonspecific 11 mm hypointense nodular focus in the anterior segment 5 of the liver may reflect focal fatty sparing, although hepatic metastasis could not be ruled out.  Patient cannot undergo MRIs.  Hence plan to monitor this.  Plan to continue current management. Because of progressive mucositis, skin changes, mouth sores, we will start skipping 5-FU bolus dose from cycle 8 onwards (07/02/2024).  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  Will proceed with 25% dose reduction going forward.  Due for cycle 12 of chemotherapy today.  Platelet count slightly decreased at 125,000.  White count increased at 16,500, presumably from Neulasta .  Will continue with dose reduced oxaliplatin .  Will continue to skip 5-FU bolus.  Since white count is elevated today, we will skip Neulasta  with  this cycle.  Since he completed 12 cycles of FOLFOX plus bevacizumab , we  will obtain restaging imaging and plan to transition to maintenance treatments with 5-FU plus bevacizumab .  Patient would like to prefer a longer break before resuming maintenance treatments.  Hence we will bring him back in 4 weeks for follow-up and initiation of maintenance treatments.  We will discuss restaging scan results on return visit  Chemotherapy-induced peripheral neuropathy Ongoing oxaliplatin -induced peripheral neuropathy with tingling, numbness, pain, and fingertip blistering. Symptoms have improved slightly but remain bothersome. Discontinuation of oxaliplatin  is expected to prevent further neurotoxicity. - Discontinued oxaliplatin  from chemotherapy regimen starting next cycle. - Advised continued use of moisturizer for symptomatic relief.  Chemotherapy-induced gastrointestinal toxicity Recent episode of diarrhea lasting 4-6 days, now resolved. Alternating diarrhea and constipation are managed with supportive medications. Intermittent nausea is controlled with antiemetics. No vomiting. - Continue current management for diarrhea and constipation as tolerated. - Use antiemetics as needed for nausea.  Chemotherapy-induced cytopenias Leukocytosis likely secondary to recent G-CSF booster. Platelet count is trending down but remains >100,000 and safe for continued therapy. Ongoing monitoring is warranted. - Held G-CSF booster for this cycle due to leukocytosis. - Monitored platelet count and will continue to assess prior to each cycle.  Chronic pain syndrome Managed with methadone , prescribed by [facility]. - Refilled methadone  prescription previously.  Attention deficit disorder, currently treated Managed with Ritalin , taken three times daily. He is experiencing difficulty managing medication schedule due to ADD. - Continue Ritalin  as prescribed.  History of myocardial infarction Previous myocardial infarction noted. Discussion of potential heart risks with  chemotherapy, but no current contraindications for treatment. - Monitor for any cardiac symptoms during chemotherapy   I reviewed lab results and outside records for this visit and discussed relevant results with the patient. Diagnosis, plan of care and treatment options were also discussed in detail with the patient. Opportunity provided to ask questions and answers provided to his apparent satisfaction. Provided instructions to call our clinic with any problems, questions or concerns prior to return visit. I recommended to continue follow-up with PCP and sub-specialists. He verbalized understanding and agreed with the plan.   NCCN guidelines have been consulted in the planning of this patients care.  I spent a total of 43 minutes during this encounter with the patient including review of chart and various tests results, discussions about plan of care and coordination of care plan.  [name], MD  08/26/2024 1:54 PM  [facility] - A DEPT OF [name] [address] [place] [address] Dept: [phone] Dept Fax: [phone]    CHIEF COMPLAINT/ REASON FOR VISIT:   Rectal cancer, initially diagnosed and treated in 2021 with concurrent chemoradiation using Xeloda .  Lost to follow-up.  Now with metastatic recurrence in the lungs, biopsy-proven in June 2025.  Current Treatment: Palliative systemic treatment with FOLFOX started from 03/28/2024.  Avastin  was added from cycle 2 onwards.   INTERVAL HISTORY:    Discussed the use of AI scribe software for clinical note transcription with the patient, who gave verbal consent to proceed.  History of Present Illness  Derrick [facility] is a 68 year old male with metastatic rectal adenocarcinoma undergoing systemic chemotherapy who presents for follow-up and management of treatment-related toxicities.  He reports significant fatigue since the last chemotherapy  cycle, with only two days spent out of bed, which he describes as worse than previous cycles. He feels burned  out and fried from ongoing therapy, and has noticed his arms are thinner, which has limited his physical activity.  Oral intake has been poor, with no food intake for two days and only minimal intake the night before the visit. He notes rapid improvement in energy after consuming sugar-containing foods. Gastrointestinal symptoms have alternated, with a recent episode of diarrhea lasting five to six days post-chemotherapy, now resolved. He prefers diarrhea over constipation, which he has experienced previously. Nausea is intermittent and managed with antiemetics; he denies vomiting.  He continues to experience chemotherapy-induced peripheral neuropathy, characterized by tingling, numbness, and pain in his fingertips, especially when touching fingers together, describing the sensation as like being hit with a hammer. He also reports blistering on his fingers, with some improvement after using moisturizer. Hair loss persists, with thinning of his ponytail and inability to braid as before.   I have reviewed the past medical history, past surgical history, social history and family history with the patient and they are unchanged from previous note.  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  2 small perirectal nodes measuring 3 to 4 mm   Lower EUS 11/13/2019-tumor in the proximal rectum.  Proximal posterolateral rectal mass.  2 abnormal lymph nodes in the perirectal region.  Staged T3N1 by endorectal ultrasound.    Previously he was seen by Dr. Cloretta in our clinic.   Xeloda /radiation 11/25/2019-01/01/2020   He was supposed to  undergo surgical resection under the direction of Dr. Debby in June 2021.  Patient apparently had dental infections around that time and needed dental extractions and patient deferred surgery and did not seek follow-up care either with the surgeons or with our department.  He also did not have follow-up appointments with gastroenterology.   His PCP referred him back to his and he reestablished care with us  on 01/30/2024.  CEA was normal at 1.68.  CBCD and CMP were unremarkable.  Plan made for restaging CT scan of the chest, abdomen and pelvis and referral sent to GI for endoscopic evaluation.  Patient cannot get MRIs because of nerve stimulator in place.   On 02/08/2024, restaging CT chest, abdomen and pelvis showed multiple lung nodules, largest 1 measuring 1.9 cm, concerning for pulmonary metastatic disease.  Previously seen circumferential superior rectal mass was no longer distinctly visible.  No evidence of lymphadenopathy or metastatic disease in the abdomen or pelvis.   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  No evidence of metastatic adenopathy in the chest or evidence of other metastatic disease in the abdomen or pelvis.  On 02/27/2024, Dr. Shelah performed flexible video fiberoptic bronchoscopy with robotic assistance and biopsies of right upper lobe lung nodule.  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  Immunostains were positive for CK20 and CDX2, negative for CK7 and TTF-1, consistent with a colorectal primary.  cTx,cNx,pM1a, Stage IV A disease.   All treatment options are palliative in nature, given stage IV disease.    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  MSI stable.  Overall no actionable mutations.  He had an appointment on 03/15/2024 with cardiothoracic surgery Dr. Shyrl for  possible resection of the  lung lesions:  he likely is out of his radiation window to undergo rectal cancer resection. If this cannot be resected there is minimal advantage to resect the pulmonary nodules   Palliative systemic treatment with FOLFOX started from 03/28/2024.  Avastin  was added from cycle 2 onwards.   Following completion of 6 cycles of FOLFOX plus Avastin , restaging CT chest, abdomen and pelvis on 06/14/2024 showed significant improvement in lung nodules.  Nonspecific 11 mm hypointense nodular focus in the anterior segment 5 of the liver may reflect focal fatty sparing, although hepatic metastasis could not be ruled out.  Patient cannot undergo MRIs.  Hence plan to monitor this.  Plan to continue current management.  Because of progressive mucositis, skin changes, mouth sores, we started skipping 5-FU bolus dose from cycle 8 onwards (07/02/2024).   Because of thrombocytopenia and progressive neuropathy, we started dose reducing oxaliplatin  by 25% from cycle 10 onwards.  Completed 12 cycles of FOLFOX plus bevacizumab  as of 08/24/2024.  Plan to switch treatments to maintenance 5-FU plus bevacizumab  from January 2026.  Oncology History  Adenocarcinoma of rectum (HCC)  11/07/2019 Initial Diagnosis   Adenocarcinoma of rectum (HCC)   03/12/2024 Cancer Staging   Staging form: Colon and Rectum, AJCC 8th Edition - Clinical stage from 03/12/2024: Stage IVA (rcTX, rcNX, rpM1a) - Signed by Autumn Millman, MD on 03/12/2024 Stage prefix: Recurrence   03/28/2024 -  Chemotherapy   Patient is on Treatment Plan : COLORECTAL FOLFOX + Bevacizumab  q14d         REVIEW OF SYSTEMS:   Review of Systems - Oncology  All other pertinent systems were reviewed with the patient and are negative.  ALLERGIES: He is allergic to latex.  MEDICATIONS:  Current Outpatient Medications  Medication Sig Dispense Refill   albuterol  (PROVENTIL ) (2.5 MG/3ML) 0.083% nebulizer solution Take 2.5 mg by nebulization  every 6 (six) hours as needed for wheezing or shortness of breath.      albuterol  (VENTOLIN  HFA) 108 (90 Base) MCG/ACT inhaler Inhale 1-2 puffs into the lungs every 6 (six) hours as needed for wheezing or shortness of breath. 1 Inhaler 5   aspirin  81 MG chewable tablet Chew 1 tablet (81 mg total) by mouth daily. 90 tablet 3   atorvastatin  (LIPITOR) 80 MG tablet Take 1 tablet (80 mg total) by mouth daily. 90 tablet 3   clopidogrel  (PLAVIX ) 75 MG tablet Take 1 tablet (75 mg total) by mouth daily. 90 tablet 1   dexamethasone  (DECADRON ) 4 MG tablet TAKE 2 TABLETS (8 MG TOTAL) BY MOUTH DAILY. START THE DAY AFTER CHEMOTHERAPY FOR 2 DAYS. TAKE WITH FOOD. 30 tablet 1   furosemide  (LASIX ) 20 MG tablet Take 1 tablet (20 mg total) by mouth daily. 90 tablet 3   lidocaine -prilocaine  (EMLA ) cream Apply to affected area once 30 g 3   linaclotide  (LINZESS ) 145 MCG CAPS capsule Take 1 capsule (145 mcg total) by mouth daily before breakfast. 30 capsule 3   magic mouthwash (nystatin , lidocaine , diphenhydrAMINE, alum & mag hydroxide) suspension Take 5 mLs by mouth every 4 (four) hours as needed for mouth pain. 500 mL 6   methadone  (DOLOPHINE ) 10 MG tablet Take 1 tablet (10 mg total) by mouth in the morning, at noon, in the evening, and at bedtime. 120 tablet 0   methylphenidate  (RITALIN ) 20 MG tablet Take 1 tablet (20 mg total) by mouth 3 (three) times daily for 15 days. 45 tablet 0   metoprolol  succinate (TOPROL -XL) 25 MG 24 hr tablet TAKE 1  TABLET BY MOUTH DAILY 100 tablet 3   nitroGLYCERIN  (NITROSTAT ) 0.4 MG SL tablet Place 1 tablet (0.4 mg total) under the tongue every 5 (five) minutes x 3 doses as needed for chest pain. 25 tablet 3   ondansetron  (ZOFRAN ) 8 MG tablet TAKE 1 TABLET BY MOUTH EVERY 8 HOURS AS NEEDED FOR NAUSEA OR VOMITING. START ON THE THIRD DAY AFTER CHEMOTHERAPY. 30 tablet 1   polyethylene glycol (MIRALAX) 17 g packet Take 17 g by mouth daily.     pregabalin  (LYRICA ) 50 MG capsule Take 1 capsule (50  mg total) by mouth daily. 30 capsule 3   prochlorperazine  (COMPAZINE ) 10 MG tablet TAKE 1 TABLET BY MOUTH EVERY 6 HOURS AS NEEDED FOR NAUSEA OR VOMITING. 30 tablet 1   Rimegepant Sulfate (NURTEC) 75 MG TBDP Take 1 tablet (75 mg total) by mouth every other day. 16 tablet 2   sacubitril -valsartan  (ENTRESTO ) 24-26 MG Take 1 tablet by mouth 2 (two) times daily. 180 tablet 3   spironolactone  (ALDACTONE ) 25 MG tablet Take 0.5 tablets (12.5 mg total) by mouth daily. 45 tablet 3   SUMAtriptan  (IMITREX ) 100 MG tablet TAKE 1 TABLET BY MOUTH AS NEEDED, MAY REPEAT IN 2 HOURS AS NEEDED IF HEADACHE PERSISTS OR REOCCURS. MAX 2 PER 24 HOURS. MAX 2 DAYS PER WEEK 12 tablet 1   tamsulosin (FLOMAX) 0.4 MG CAPS capsule TAKE 1 TO 2 CAPSULES BY MOUTH AT BEDTIME WITH MEAL FOR PROSTATE. **DUE FOR OFFICE VISIT 04/17/24** 180 capsule 0   traZODone  (DESYREL ) 150 MG tablet Take 0.5 tablets (75 mg total) by mouth at bedtime as needed for sleep.     VITAMIN D -VITAMIN K PO Take 1 capsule by mouth daily. 10,000IU/ 100 mcg     Wheat Dextrin (BENEFIBER PO) Take 1 tablet by mouth 3 (three) times daily as needed (Constipation per wating report).     cyclobenzaprine (FLEXERIL) 10 MG tablet TAKE 1 TABLET BY MOUTH EVERY 8 HOURS AS NEEDED FOR PAIN OR SPASMS. 60 tablet 0   No current facility-administered medications for this visit.   Facility-Administered Medications Ordered in Other Visits  Medication Dose Route Frequency Provider Last Rate Last Admin   0.9 %  sodium chloride  infusion   Intravenous Continuous Paetyn Pietrzak, MD 10 mL/hr at 08/26/24 0950 New Bag at 08/26/24 0950   clindamycin  (CLEOCIN ) 900 mg in dextrose  5 % 50 mL IVPB  900 mg Intravenous 60 min Pre-Op Debby Hila, MD       And   gentamicin  (GARAMYCIN ) 5 mg/kg in dextrose  5 % 50 mL IVPB  5 mg/kg Intravenous 60 min Pre-Op Debby Hila, MD       dextrose  5 % solution   Intravenous Continuous Ferry Matthis, MD 10 mL/hr at 08/26/24 1102 New Bag at 08/26/24 1102    fluorouracil  (ADRUCIL ) 5,000 mg in sodium chloride  0.9 % 150 mL chemo infusion  2,400 mg/m2 (Treatment Plan Recorded) Intravenous 1 day or 1 dose Danis Pembleton, MD   5,000 mg at 08/26/24 1335     VITALS:   Blood pressure 131/84, pulse 84, temperature 98.4 F (36.9 C), temperature source Temporal, resp. rate 18, height 5' 9 (1.753 m), weight 213 lb 3.2 oz (96.7 kg), SpO2 97%.  Wt Readings from Last 3 Encounters:  08/26/24 213 lb 3.2 oz (96.7 kg)  08/12/24 212 lb 8 oz (96.4 kg)  07/29/24 215 lb 12.8 oz (97.9 kg)    Body mass index is 31.48 kg/m.    Onc Performance Status - 08/26/24 9141  ECOG Perf Status   ECOG Perf Status Ambulatory and capable of all selfcare but unable to carry out any work activities.  Up and about more than 50% of waking hours      KPS SCALE   KPS % SCORE Cares for self, unable to carry on normal activity or to do active work           PHYSICAL EXAM:   Physical Exam Constitutional:      General: He is not in acute distress.    Appearance: Normal appearance.  HENT:     Head: Normocephalic and atraumatic.  Eyes:     Conjunctiva/sclera: Conjunctivae normal.  Cardiovascular:     Rate and Rhythm: Normal rate and regular rhythm.  Pulmonary:     Effort: Pulmonary effort is normal. No respiratory distress.  Abdominal:     General: There is no distension.  Neurological:     General: No focal deficit present.     Mental Status: He is alert and oriented to person, place, and time.  Psychiatric:        Mood and Affect: Mood normal.        Behavior: Behavior normal.      LABORATORY DATA:   I have reviewed the data as listed.  Results for orders placed or performed in visit on 08/26/24  CBC with Differential ([facility] Only)  Result Value Ref Range   WBC Count 16.5 (H) 4.0 - 10.5 K/uL   RBC 3.52 (L) 4.22 - 5.81 MIL/uL   Hemoglobin 11.5 (L) 13.0 - 17.0 g/dL   HCT 65.7 (L) 60.9 - 47.9 %   MCV 97.2 80.0 - 100.0 fL   MCH 32.7 26.0 -  34.0 pg   MCHC 33.6 30.0 - 36.0 g/dL   RDW 81.3 (H) 88.4 - 84.4 %   Platelet Count 125 (L) 150 - 400 K/uL   nRBC 0.1 0.0 - 0.2 %   Neutrophils Relative % 88 %   Neutro Abs 14.5 (H) 1.7 - 7.7 K/uL   Lymphocytes Relative 4 %   Lymphs Abs 0.7 0.7 - 4.0 K/uL   Monocytes Relative 5 %   Monocytes Absolute 0.8 0.1 - 1.0 K/uL   Eosinophils Relative 0 %   Eosinophils Absolute 0.0 0.0 - 0.5 K/uL   Basophils Relative 0 %   Basophils Absolute 0.0 0.0 - 0.1 K/uL   Immature Granulocytes 3 %   Abs Immature Granulocytes 0.43 (H) 0.00 - 0.07 K/uL  CMP ([facility] only)  Result Value Ref Range   Sodium 140 135 - 145 mmol/L   Potassium 4.2 3.5 - 5.1 mmol/L   Chloride 105 98 - 111 mmol/L   CO2 25 22 - 32 mmol/L   Glucose, Bld 142 (H) 70 - 99 mg/dL   BUN 11 8 - 23 mg/dL   Creatinine 9.18 9.38 - 1.24 mg/dL   Calcium  9.6 8.9 - 10.3 mg/dL   Total Protein 7.5 6.5 - 8.1 g/dL   Albumin 4.4 3.5 - 5.0 g/dL   AST 82 (H) 15 - 41 U/L   ALT 77 (H) 0 - 44 U/L   Alkaline Phosphatase 176 (H) 38 - 126 U/L   Total Bilirubin 0.5 0.0 - 1.2 mg/dL   GFR, Estimated >39 >39 mL/min   Anion gap 10 5 - 15  Total Protein, Urine dipstick  Result Value Ref Range   Protein, ur NEGATIVE NEGATIVE mg/dL      RADIOGRAPHIC STUDIES:  CT CHEST ABDOMEN PELVIS W CONTRAST  CLINICAL DATA:  Restaging stage IV rectal cancer. * Tracking Code: BO *  EXAM: CT CHEST, ABDOMEN, AND PELVIS WITH CONTRAST  TECHNIQUE: Multidetector CT imaging of the chest, abdomen and pelvis was performed following the standard protocol during bolus administration of intravenous contrast.  RADIATION DOSE REDUCTION: This exam was performed according to the departmental dose-optimization program which includes automated exposure control, adjustment of the mA and/or kV according to patient size and/or use of iterative reconstruction technique.  CONTRAST:  OMNIPAQUE  IOHEXOL  300 MG/ML  SOLN  COMPARISON:  PET-CT February 23, 2024 and CT February 08, 2024  FINDINGS: CT CHEST FINDINGS  Cardiovascular: Accessed right chest Port-A-Cath with tip in the right atrium. Three-vessel coronary artery calcifications. Normal size heart. No significant pericardial effusion/thickening.  Mediastinum/Nodes: No suspicious thyroid  nodule. No pathologically enlarged mediastinal, hilar or axillary lymph nodes. The esophagus is grossly unremarkable.  Lungs/Pleura: Decreased size of the bilateral pulmonary nodules. For reference:  -right upper lobe pulmonary nodule on image 40/7 is now predominantly a thin walled cyst measuring 7 mm with a dominant soft tissue nodular focus along the lateral margin measuring 3 mm on image 40/7 previously this was a solid nodule measuring 19 x 17 mm.  -left anterior upper lobe pulmonary nodule on image 62/7 demonstrates a similar features with a cystic component measuring 8 mm and the dominant nodular component measuring 2 mm on image 62/7 previously this was a solid nodule measuring 13 x 12 mm.  No new suspicious pulmonary nodules or masses.  Musculoskeletal: No aggressive lytic or blastic lesion of bone. Intrathecal spinal stimulator lead.  CT ABDOMEN PELVIS FINDINGS  Hepatobiliary: New hypodense nodular focus in anterior segment V measuring 11 mm on image 65/2. Gallbladder surgically absent. No biliary ductal dilation.  Pancreas: No pancreatic ductal dilation or evidence of acute inflammation.  Spleen: No splenomegaly.  Adrenals/Urinary Tract: No suspicious adrenal nodule/mass. No hydronephrosis. Kidneys demonstrate symmetric enhancement. Urinary bladder is unremarkable for degree of distension.  Stomach/Bowel: Stomach is distended with ingested material and gas without focal wall thickening. No pathologic dilation of small or large bowel. No evidence of acute bowel inflammation.  No discrete rectal mass or asymmetric wall thickening identified.  Vascular/Lymphatic: Normal caliber abdominal aorta.  Smooth IVC contours. The portal, splenic and superior mesenteric veins are patent. No pathologically enlarged abdominal or pelvic lymph nodes.  Reproductive: Uterus and bilateral adnexa are unremarkable.  Other: No significant abdominopelvic free fluid.  Musculoskeletal: No aggressive lytic or blastic lesion of bone. Degenerative change of the bilateral hips.  IMPRESSION: 1. New hypodense nodular focus in anterior segment V measuring 11 mm, may reflect focal fatty sparing although a hepatic metastasis is a pertinent differential consideration. Suggest more definitive assessment by abdominal MRI with and without contrast. 2. Decreased size of the bilateral pulmonary nodules, consistent with treatment response. 3. No discrete rectal mass or asymmetric wall thickening identified.  Electronically Signed   By: Reyes Holder M.D.   On: 06/17/2024 08:04    CODE STATUS:  Code Status History     Date Active Date Inactive Code Status Order ID Comments User Context   11/27/2023 2206 11/29/2023 1747 Full Code [id number]  Cesario Chamorro, MD Inpatient    Questions for Most Recent Historical Code Status (Order [id number])     Question Answer   By: Consent: discussion documented in EHR            Orders Placed This Encounter  Procedures   CT CHEST ABDOMEN PELVIS W CONTRAST  Standing Status:   Future    Expected Date:   09/11/2024    Expiration Date:   08/26/2025    If indicated for the ordered procedure, I authorize the administration of contrast media per Radiology protocol:   Yes    Does the patient have a contrast media/X-ray dye allergy?:   No    Preferred imaging location?:   [facility]    If indicated for the ordered procedure, I authorize the administration of oral contrast media per Radiology protocol:   Yes   CBC with Differential ([facility] Only)    Standing Status:   Future    Expected Date:   10/07/2024    Expiration Date:   10/07/2025   CMP ([facility]  only)    Standing Status:   Future    Expected Date:   10/07/2024    Expiration Date:   10/07/2025   Total Protein, Urine dipstick    Standing Status:   Future    Expected Date:   10/07/2024    Expiration Date:   10/07/2025     Future Appointments  Date Time Provider Department [facility]  08/28/2024 11:30 AM [facility] None  09/23/2024  8:45 AM [facility] None  09/23/2024  9:45 AM Kaimana Lurz, MD [facility] None  09/23/2024 10:45 AM [facility] None  09/25/2024  2:45 PM [facility] None  10/07/2024  8:30 AM [facility] None  10/07/2024  9:30 AM Miyako Oelke, MD [facility] None  10/07/2024 10:00 AM [facility] None  10/09/2024  2:00 PM [facility] None  01/14/2025  8:45 AM [facility]  01/21/2025  9:30 AM Chandra Toribio POUR, MD [facility]    This document was completed utilizing engineer, civil (consulting). Grammatical errors, random word insertions, pronoun errors, and incomplete sentences are an occasional consequence of this system due to software limitations, ambient noise, and hardware issues. Any formal questions or concerns about the content, text or information contained within the body of this dictation should be directly addressed to the provider for clarification.    "

## 2024-08-26 NOTE — Patient Instructions (Signed)

## 2024-08-26 NOTE — Assessment & Plan Note (Signed)
 Please review oncology history for additional details and timeline of events.  Patient was originally diagnosed with rectal cancer in 2021 and was treated with concurrent chemoradiation with Xeloda  with eventual plans for surgery.  He was lost to follow-up after that.  Restaging CT chest abdomen pelvis on 02/08/2024 suggested multiple lung nodules, concerning for metastatic recurrence.  On 02/27/2024, Dr. Shelah performed flexible video fiberoptic bronchoscopy with robotic assistance and biopsies of right upper lobe lung nodule.  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  Immunostains were positive for CK20 and CDX2, negative for CK7 and TTF-1, consistent with a colorectal primary.  cTx,cNx,pM1a, Stage IV A disease.  MSI stable.  NGS testing on the specimen showed no actionable mutations.  All treatment options are palliative in nature, given stage IV disease.    He had an appointment on 03/15/2024 with cardiothoracic surgery Dr. Shyrl for possible resection of the lung lesions:  he likely is out of his radiation window to undergo rectal cancer resection. If this cannot be resected there is minimal advantage to resect the pulmonary nodules   Palliative systemic treatment with FOLFOX started from 03/28/2024.  Avastin  was added from cycle 2 onwards.   Tolerating chemotherapy reasonably well overall with some expected side effects including mouth sores.  Symptoms controlled with supportive therapy including mouth rinses with baking soda and salt.  We prescribed Magic mouthwash previously.  Following completion of 6 cycles of FOLFOX plus Avastin , restaging CT chest, abdomen and pelvis on 06/14/2024 showed significant improvement in lung nodules.  Nonspecific 11 mm hypointense nodular focus in the anterior segment 5 of the liver may reflect focal fatty sparing, although hepatic metastasis could not be ruled out.  Patient cannot undergo MRIs.  Hence plan to monitor this.  Plan to continue current  management. Because of progressive mucositis, skin changes, mouth sores, we will start skipping 5-FU bolus dose from cycle 8 onwards (07/02/2024).  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  Will proceed with 25% dose reduction going forward.  Due for cycle 12 of chemotherapy today.  Platelet count slightly decreased at 125,000.  White count increased at 16,500, presumably from Neulasta .  Will continue with dose reduced oxaliplatin .  Will continue to skip 5-FU bolus.  Since white count is elevated today, we will skip Neulasta  with this cycle.  Since he completed 12 cycles of FOLFOX plus bevacizumab , we will obtain restaging imaging and plan to transition to maintenance treatments with 5-FU plus bevacizumab .  Patient would like to prefer a longer break before resuming maintenance treatments.  Hence we will bring him back in 4 weeks for follow-up and initiation of maintenance treatments.  We will discuss restaging scan results on return visit

## 2024-08-26 NOTE — Progress Notes (Signed)
 Patient seen by Dr. Chinita Pasam today  Vitals are within treatment parameters:Yes   Labs are within treatment parameters: Yes   Treatment plan has been signed: Yes   Per physician team, Patient is ready for treatment. Please note the following modifications:  We will proceed with chemo today. No changes in chemo. We will hold neulasta  this cycle.

## 2024-08-27 ENCOUNTER — Other Ambulatory Visit (HOSPITAL_COMMUNITY): Payer: Self-pay

## 2024-08-27 ENCOUNTER — Telehealth: Payer: Self-pay

## 2024-08-27 NOTE — Telephone Encounter (Addendum)
 Pharmacy Patient Advocate Encounter   Received notification from Onbase that prior authorization for Rimegepant Sulfate (NURTEC) 75 MG TBDP  is required/requested.   Insurance verification completed.   The patient is insured through [employer].   Per test claim: PA required; PA submitted to above mentioned insurance via Latent Key/confirmation #/EOC [id number] Status is pending  NOTED;  Try [place] tablet,naratriptan tablet,ri zatriptan tablet,naproxen tablet,ibuprof en tablet

## 2024-08-28 ENCOUNTER — Encounter: Payer: Self-pay | Admitting: Oncology

## 2024-08-28 ENCOUNTER — Inpatient Hospital Stay

## 2024-08-28 ENCOUNTER — Other Ambulatory Visit (HOSPITAL_COMMUNITY): Payer: Self-pay

## 2024-08-28 VITALS — BP 117/69 | HR 82 | Temp 98.1°F | Resp 18

## 2024-08-28 DIAGNOSIS — Z5111 Encounter for antineoplastic chemotherapy: Secondary | ICD-10-CM | POA: Diagnosis not present

## 2024-08-28 DIAGNOSIS — C20 Malignant neoplasm of rectum: Secondary | ICD-10-CM

## 2024-08-28 NOTE — Patient Instructions (Signed)

## 2024-08-28 NOTE — Progress Notes (Signed)
 Pt came in for deaccessing of port and home pump. Pt told this nurse that the dressing was starting to come loose yesterday so he reinforced it, but then early this morning the needle had disengaged from his port site. Site was dry, clean and intact with a band aid cover site. Pt had pump bag that contained fully needle, treatment bag, and pump. Dr. Autumn made aware of what occurred. Dr. Myles to flush port due to pts next appointment being a month away. Pt was reaccessed and port was flush with good blood return noted.

## 2024-08-28 NOTE — Telephone Encounter (Signed)
 Pharmacy Patient Advocate Encounter  Received notification from [employer] that Prior Authorization for Nurtec 75MG  dispersible tablets   has been APPROVED from 08/28/2024 to 09/04/2025. Ran test claim, Copay is $0.00. This test claim was processed through [facility]- copay amounts may vary at other pharmacies due to pharmacy/plan contracts, or as the patient moves through the different stages of their insurance plan.   PA #/Case ID/Reference #: [id number] PLEASE BE ADVISED THE DRUG IS ONLY APPROVED UP TO 30 DAYS SUPPLY WAS DENIED DUE TO DAY SUPPLY IN AS 32 DAYS SEE PLAN OUTCOME BELOW

## 2024-08-30 ENCOUNTER — Encounter: Payer: Self-pay | Admitting: Oncology

## 2024-09-09 ENCOUNTER — Inpatient Hospital Stay

## 2024-09-09 ENCOUNTER — Inpatient Hospital Stay: Admitting: Oncology

## 2024-09-11 ENCOUNTER — Inpatient Hospital Stay

## 2024-09-12 ENCOUNTER — Encounter: Payer: Self-pay | Admitting: Oncology

## 2024-09-14 ENCOUNTER — Other Ambulatory Visit: Payer: Self-pay | Admitting: Oncology

## 2024-09-16 ENCOUNTER — Ambulatory Visit (HOSPITAL_BASED_OUTPATIENT_CLINIC_OR_DEPARTMENT_OTHER)

## 2024-09-17 ENCOUNTER — Encounter: Payer: Self-pay | Admitting: Oncology

## 2024-09-18 ENCOUNTER — Other Ambulatory Visit: Payer: Self-pay | Admitting: Family Medicine

## 2024-09-18 ENCOUNTER — Ambulatory Visit (HOSPITAL_BASED_OUTPATIENT_CLINIC_OR_DEPARTMENT_OTHER)
Admission: RE | Admit: 2024-09-18 | Discharge: 2024-09-18 | Disposition: A | Discharge status: Home / Self Care | Source: Ambulatory Visit | Attending: Oncology | Admitting: Oncology

## 2024-09-18 DIAGNOSIS — G894 Chronic pain syndrome: Secondary | ICD-10-CM

## 2024-09-18 DIAGNOSIS — Z5181 Encounter for therapeutic drug level monitoring: Secondary | ICD-10-CM

## 2024-09-18 DIAGNOSIS — C20 Malignant neoplasm of rectum: Secondary | ICD-10-CM | POA: Insufficient documentation

## 2024-09-18 DIAGNOSIS — Z79899 Other long term (current) drug therapy: Secondary | ICD-10-CM

## 2024-09-18 DIAGNOSIS — G950 Syringomyelia and syringobulbia: Secondary | ICD-10-CM

## 2024-09-18 DIAGNOSIS — M797 Fibromyalgia: Secondary | ICD-10-CM

## 2024-09-18 MED ORDER — IOHEXOL 300 MG/ML  SOLN
100.0000 mL | Freq: Once | INTRAMUSCULAR | Status: AC | PRN
  Administered 2024-09-18: 100 mL via INTRAVENOUS

## 2024-09-23 ENCOUNTER — Encounter: Payer: Self-pay | Admitting: Oncology

## 2024-09-23 ENCOUNTER — Inpatient Hospital Stay: Payer: Self-pay | Admitting: Oncology

## 2024-09-23 ENCOUNTER — Inpatient Hospital Stay: Payer: Self-pay | Attending: Oncology

## 2024-09-23 ENCOUNTER — Other Ambulatory Visit: Payer: Self-pay

## 2024-09-23 ENCOUNTER — Inpatient Hospital Stay: Payer: Self-pay

## 2024-09-23 VITALS — BP 132/83 | HR 65 | Temp 98.7°F | Resp 16 | Wt 210.8 lb

## 2024-09-23 VITALS — BP 115/71 | HR 55 | Temp 98.2°F | Resp 18

## 2024-09-23 DIAGNOSIS — G629 Polyneuropathy, unspecified: Secondary | ICD-10-CM | POA: Diagnosis not present

## 2024-09-23 DIAGNOSIS — R300 Dysuria: Secondary | ICD-10-CM | POA: Diagnosis not present

## 2024-09-23 DIAGNOSIS — Z7952 Long term (current) use of systemic steroids: Secondary | ICD-10-CM | POA: Diagnosis not present

## 2024-09-23 DIAGNOSIS — T451X5A Adverse effect of antineoplastic and immunosuppressive drugs, initial encounter: Secondary | ICD-10-CM | POA: Insufficient documentation

## 2024-09-23 DIAGNOSIS — Z7902 Long term (current) use of antithrombotics/antiplatelets: Secondary | ICD-10-CM | POA: Diagnosis not present

## 2024-09-23 DIAGNOSIS — G894 Chronic pain syndrome: Secondary | ICD-10-CM | POA: Insufficient documentation

## 2024-09-23 DIAGNOSIS — I2510 Atherosclerotic heart disease of native coronary artery without angina pectoris: Secondary | ICD-10-CM | POA: Insufficient documentation

## 2024-09-23 DIAGNOSIS — C7800 Secondary malignant neoplasm of unspecified lung: Secondary | ICD-10-CM | POA: Insufficient documentation

## 2024-09-23 DIAGNOSIS — D696 Thrombocytopenia, unspecified: Secondary | ICD-10-CM | POA: Insufficient documentation

## 2024-09-23 DIAGNOSIS — Z79620 Long term (current) use of immunosuppressive biologic: Secondary | ICD-10-CM | POA: Diagnosis not present

## 2024-09-23 DIAGNOSIS — Z79631 Long term (current) use of antimetabolite agent: Secondary | ICD-10-CM | POA: Insufficient documentation

## 2024-09-23 DIAGNOSIS — C20 Malignant neoplasm of rectum: Secondary | ICD-10-CM

## 2024-09-23 DIAGNOSIS — Z79899 Other long term (current) drug therapy: Secondary | ICD-10-CM | POA: Diagnosis not present

## 2024-09-23 DIAGNOSIS — E876 Hypokalemia: Secondary | ICD-10-CM | POA: Insufficient documentation

## 2024-09-23 DIAGNOSIS — K521 Toxic gastroenteritis and colitis: Secondary | ICD-10-CM | POA: Insufficient documentation

## 2024-09-23 DIAGNOSIS — Z5111 Encounter for antineoplastic chemotherapy: Secondary | ICD-10-CM | POA: Diagnosis present

## 2024-09-23 DIAGNOSIS — N390 Urinary tract infection, site not specified: Secondary | ICD-10-CM | POA: Diagnosis not present

## 2024-09-23 DIAGNOSIS — M549 Dorsalgia, unspecified: Secondary | ICD-10-CM | POA: Insufficient documentation

## 2024-09-23 DIAGNOSIS — M797 Fibromyalgia: Secondary | ICD-10-CM | POA: Diagnosis not present

## 2024-09-23 DIAGNOSIS — I700 Atherosclerosis of aorta: Secondary | ICD-10-CM | POA: Diagnosis not present

## 2024-09-23 DIAGNOSIS — Z7982 Long term (current) use of aspirin: Secondary | ICD-10-CM | POA: Diagnosis not present

## 2024-09-23 DIAGNOSIS — Z79891 Long term (current) use of opiate analgesic: Secondary | ICD-10-CM | POA: Insufficient documentation

## 2024-09-23 DIAGNOSIS — I252 Old myocardial infarction: Secondary | ICD-10-CM | POA: Insufficient documentation

## 2024-09-23 LAB — CMP (CANCER CENTER ONLY)
ALT: 22 U/L (ref 0–44)
AST: 30 U/L (ref 15–41)
Albumin: 4 g/dL (ref 3.5–5.0)
Alkaline Phosphatase: 87 U/L (ref 38–126)
Anion gap: 11 (ref 5–15)
BUN: 5 mg/dL — ABNORMAL LOW (ref 8–23)
CO2: 27 mmol/L (ref 22–32)
Calcium: 9.3 mg/dL (ref 8.9–10.3)
Chloride: 105 mmol/L (ref 98–111)
Creatinine: 0.65 mg/dL (ref 0.61–1.24)
GFR, Estimated: >60 mL/min
Glucose, Bld: 106 mg/dL — ABNORMAL HIGH (ref 70–99)
Potassium: 3.3 mmol/L — ABNORMAL LOW (ref 3.5–5.1)
Sodium: 143 mmol/L (ref 135–145)
Total Bilirubin: 0.4 mg/dL (ref 0.0–1.2)
Total Protein: 6.8 g/dL (ref 6.5–8.1)

## 2024-09-23 LAB — CBC WITH DIFFERENTIAL (CANCER CENTER ONLY)
Abs Immature Granulocytes: 0.01 K/uL (ref 0.00–0.07)
Basophils Absolute: 0 K/uL (ref 0.0–0.1)
Basophils Relative: 1 %
Eosinophils Absolute: 0.1 K/uL (ref 0.0–0.5)
Eosinophils Relative: 2 %
HCT: 37.7 % — ABNORMAL LOW (ref 39.0–52.0)
Hemoglobin: 12.2 g/dL — ABNORMAL LOW (ref 13.0–17.0)
Immature Granulocytes: 0 %
Lymphocytes Relative: 21 %
Lymphs Abs: 0.9 K/uL (ref 0.7–4.0)
MCH: 31.1 pg (ref 26.0–34.0)
MCHC: 32.4 g/dL (ref 30.0–36.0)
MCV: 96.2 fL (ref 80.0–100.0)
Monocytes Absolute: 0.6 K/uL (ref 0.1–1.0)
Monocytes Relative: 13 %
Neutro Abs: 2.7 K/uL (ref 1.7–7.7)
Neutrophils Relative %: 63 %
Platelet Count: 152 K/uL (ref 150–400)
RBC: 3.92 MIL/uL — ABNORMAL LOW (ref 4.22–5.81)
RDW: 16.1 % — ABNORMAL HIGH (ref 11.5–15.5)
WBC Count: 4.3 K/uL (ref 4.0–10.5)
nRBC: 0 % (ref 0.0–0.2)

## 2024-09-23 LAB — URINALYSIS, COMPLETE (UACMP) WITH MICROSCOPIC
Bacteria, UA: NONE SEEN
Bilirubin Urine: NEGATIVE
Glucose, UA: NEGATIVE mg/dL
Hgb urine dipstick: NEGATIVE
Ketones, ur: NEGATIVE mg/dL
Leukocytes,Ua: NEGATIVE
Nitrite: NEGATIVE
Protein, ur: NEGATIVE mg/dL
Specific Gravity, Urine: 1.014 (ref 1.005–1.030)
pH: 6.5 (ref 5.0–8.0)

## 2024-09-23 LAB — TOTAL PROTEIN, URINE DIPSTICK: Protein, ur: NEGATIVE mg/dL

## 2024-09-23 MED ORDER — SULFAMETHOXAZOLE-TRIMETHOPRIM 800-160 MG PO TABS: 1.0000 | ORAL_TABLET | Freq: Two times a day (BID) | ORAL | 0 refills | Status: AC

## 2024-09-23 MED ORDER — DEXTROSE 5 % IV SOLN: INTRAVENOUS | Status: DC

## 2024-09-23 MED ORDER — SODIUM CHLORIDE 0.9 % IV SOLN
5.0000 mg/kg | Freq: Once | INTRAVENOUS | Status: AC
  Administered 2024-09-23: 500 mg via INTRAVENOUS
  Filled 2024-09-23: qty 16

## 2024-09-23 MED ORDER — POTASSIUM CHLORIDE CRYS ER 20 MEQ PO TBCR
40.0000 meq | EXTENDED_RELEASE_TABLET | Freq: Once | ORAL | Status: AC
  Administered 2024-09-23: 40 meq via ORAL
  Filled 2024-09-23: qty 2

## 2024-09-23 MED ORDER — SODIUM CHLORIDE 0.9 % IV SOLN
2400.0000 mg/m2 | INTRAVENOUS | Status: DC
  Administered 2024-09-23: 5000 mg via INTRAVENOUS
  Filled 2024-09-23: qty 100

## 2024-09-23 MED ORDER — SODIUM CHLORIDE 0.9 % IV SOLN: INTRAVENOUS | Status: DC

## 2024-09-23 MED ORDER — DEXAMETHASONE SOD PHOSPHATE PF 10 MG/ML IJ SOLN
10.0000 mg | Freq: Once | INTRAMUSCULAR | Status: AC
  Administered 2024-09-23: 10 mg via INTRAVENOUS
  Filled 2024-09-23: qty 1

## 2024-09-23 MED ORDER — PALONOSETRON HCL INJECTION 0.25 MG/5ML
0.2500 mg | Freq: Once | INTRAVENOUS | Status: AC
  Administered 2024-09-23: 0.25 mg via INTRAVENOUS
  Filled 2024-09-23: qty 5

## 2024-09-23 MED ORDER — SODIUM CHLORIDE 0.9 % IV SOLN
400.0000 mg/m2 | Freq: Once | INTRAVENOUS | Status: AC
  Administered 2024-09-23: 880 mg via INTRAVENOUS
  Filled 2024-09-23: qty 44

## 2024-09-23 NOTE — Patient Instructions (Signed)

## 2024-09-23 NOTE — Progress Notes (Signed)
 "  [facility]  ONCOLOGY CLINIC PROGRESS NOTE   Patient Care Team: Chandra Toribio POUR, MD as PCP - General (Family Medicine) Wendel Lurena POUR, MD as PCP - Cardiology (Cardiology) Babs Arthea DASEN, MD as Consulting Physician (Physical Medicine and Rehabilitation) Debby Fidela CROME, NP as Nurse Practitioner (Physical Medicine and Rehabilitation) Lennard Lesta FALCON, MD as Consulting Physician (Gastroenterology)  PATIENT NAME: Derrick Johnson   MR#: [id number] DOB: 01-13-1956  Date of visit: 09/23/2024   ASSESSMENT & PLAN:   MAURISIO RUDDY is a 69 y.o. gentleman with a past medical history of rectal cancer diagnosed in February 2021, treated with concurrent chemoradiation using Xeloda , completed treatment in April 2021, patient did not follow-up with surgery or with oncology departments after this. 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 He was referred back to our clinic by his PCP to reestablish care for his history of rectal cancer.  He has recurrence of disease with biopsy-proven lung mets.  Adenocarcinoma of rectum [facility]) Please review oncology history for additional details and timeline of events.  Patient was originally diagnosed with rectal cancer in 2021 and was treated with concurrent chemoradiation with Xeloda  with eventual plans for surgery.  He was lost to follow-up after that.  Restaging CT chest abdomen pelvis on 02/08/2024 suggested multiple lung nodules, concerning for metastatic recurrence.  On 02/27/2024, Dr. Shelah performed flexible video fiberoptic bronchoscopy with robotic assistance and biopsies of right upper lobe lung nodule.  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  Immunostains were positive for CK20 and CDX2, negative for CK7 and TTF-1, consistent with a colorectal  primary.  cTx,cNx,pM1a, Stage IV A disease.  MSI stable.  NGS testing on the specimen showed no actionable mutations.  All treatment options are palliative in nature, given stage IV disease.    He had an appointment on 03/15/2024 with cardiothoracic surgery Dr. Shyrl for possible resection of the lung lesions:  he likely is out of his radiation window to undergo rectal cancer resection. If this cannot be resected there is minimal advantage to resect the pulmonary nodules   Palliative systemic treatment with FOLFOX started from 03/28/2024.  Avastin  was added from cycle 2 onwards.   Tolerating chemotherapy reasonably well overall with some expected side effects including mouth sores.  Symptoms controlled with supportive therapy including mouth rinses with baking soda and salt.  We prescribed Magic mouthwash previously.  Following completion of 6 cycles of FOLFOX plus Avastin , restaging CT chest, abdomen and pelvis on 06/14/2024 showed significant improvement in lung nodules.  Nonspecific 11 mm hypointense nodular focus in the anterior segment 5 of the liver may reflect focal fatty sparing, although hepatic metastasis could not be ruled out.  Patient cannot undergo MRIs.  Hence plan to monitor this.  Plan to continue current management. Because of progressive mucositis, skin changes, mouth sores, we will start skipping 5-FU bolus dose from cycle 8 onwards (07/02/2024).  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  Will proceed with 25% dose reduction going forward.  Completed 12 cycles of FOLFOX as of 08/26/2024.  Restaging CT chest, abdomen and pelvis on 09/18/2024 showed similar rectal wall thickening with healed pulmonary metastases in the upper lobes. No evidence of disease progression. 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  Overall this is consistent with  excellent response.  Plan is to switch to  maintenance treatments with 5-FU plus Avastin .  Labs today reveal no dose-limiting toxicities.  Will proceed with maintenance therapy starting from tomorrow and continue this every 2 weeks.  RTC in 2 weeks for follow-up with repeat labs and continuation of maintenance treatment  Chemotherapy-induced diarrhea and vomiting Diarrhea and vomiting are attributed to chemotherapy, with severe symptoms and ongoing fatigue and malaise. Diarrhea is partially responsive to Imodium. Persistent symptoms increase risk for dehydration and electrolyte disturbances. - Advised Imodium as needed: two tablets at first loose bowel movement, then one tablet after each subsequent loose bowel movement, up to eight tablets daily. - Instructed to notify if diarrhea is not controlled with Imodium for consideration of prescription antidiarrheal agents.  Urinary tract infection Malodorous urine and mild dysuria are consistent with urinary tract infection. Symptoms have persisted for several days to weeks. Recurrent UTIs increase risk for resistant organisms; confirmation and sensitivity testing are necessary. - Ordered urinalysis to confirm infection and assess for resistant organisms. - Sent prescription for Bactrim  for 10 days to his pharmacy.  Hypokalemia Potassium is low at 3.3, likely secondary to ongoing diarrhea. Renal and hepatic function are otherwise normal. Persistent diarrhea increases risk for further electrolyte disturbances. - Administered oral potassium supplementation during clinic visit. - Planned to monitor potassium levels and consider prescription potassium if hypokalemia persists.  Chronic pain syndrome Managed with methadone , prescribed by [facility]. - Refilled methadone  prescription previously.  Attention deficit disorder, currently treated Managed with Ritalin , taken three times daily. He is experiencing difficulty managing medication schedule due to ADD. - Continue Ritalin  as  prescribed.  History of myocardial infarction Previous myocardial infarction noted. Discussion of potential heart risks with chemotherapy, but no current contraindications for treatment. - Monitor for any cardiac symptoms during chemotherapy   I reviewed lab results and outside records for this visit and discussed relevant results with the patient. Diagnosis, plan of care and treatment options were also discussed in detail with the patient. Opportunity provided to ask questions and answers provided to his apparent satisfaction. Provided instructions to call our clinic with any problems, questions or concerns prior to return visit. I recommended to continue follow-up with PCP and sub-specialists. He verbalized understanding and agreed with the plan.   NCCN guidelines have been consulted in the planning of this patients care.  I spent a total of 43 minutes during this encounter with the patient including review of chart and various tests results, discussions about plan of care and coordination of care plan.  [name], MD  09/23/2024 4:19 PM  [facility] - A DEPT OF [name] [id number] [place] [address] Dept: [phone] Dept Fax: [phone]    CHIEF COMPLAINT/ REASON FOR VISIT:   Rectal cancer, initially diagnosed and treated in 2021 with concurrent chemoradiation using Xeloda .  Lost to follow-up.  Now with metastatic recurrence in the lungs, biopsy-proven in June 2025.  Current Treatment: Palliative systemic treatment with FOLFOX started from 03/28/2024.  Avastin  was added from cycle 2 onwards.   INTERVAL HISTORY:    Discussed the use of AI scribe software for clinical note transcription with the patient, who gave verbal consent to proceed.  History of Present Illness  Ebubechukwu Jedlicka is a 68 year old male with rectal adenocarcinoma who presents for follow-up of severe  chemotherapy-induced gastrointestinal symptoms and new urinary tract infection.  He is currently receiving  maintenance chemotherapy for rectal adenocarcinoma following an intensive induction regimen. Recent imaging demonstrated resolution of prior lung nodules, now appearing as scar tissue, and persistent rectal thickening unchanged from previous scans. No new or progressive metastatic lesions were identified.  Over the past four weeks, he has experienced significant fatigue, cognitive impairment, and alternating sensations of heat and cold, with only two to three days of functional activity during this period. He describes profound cognitive fog and difficulty communicating today.  He had a severe episode of diarrhea and vomiting last Monday, which he states was his first lifetime episode of emesis. Diarrhea has persisted, requiring loperamide three to four times daily. Despite an extended interval between chemotherapy cycles, gastrointestinal symptoms have not improved.  He reports new-onset malodorous urine and mild dysuria over the past several days, consistent with a urinary tract infection. These symptoms have been present for approximately two weeks. He has a history of similar infections responsive to antibiotics. The combination of persistent gastrointestinal symptoms and new urinary complaints has contributed to his overall malaise.   I have reviewed the past medical history, past surgical history, social history and family history with the patient and they are unchanged from previous note.  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  2 small perirectal nodes measuring 3 to 4 mm   Lower EUS 11/13/2019-tumor in the proximal rectum.   Proximal posterolateral rectal mass.  2 abnormal lymph nodes in the perirectal region.  Staged T3N1 by endorectal ultrasound.    Previously he was seen by Dr. Cloretta in our clinic.   Xeloda /radiation 11/25/2019-01/01/2020   He was supposed to undergo surgical resection under the direction of Dr. Debby in June 2021.  Patient apparently had dental infections around that time and needed dental extractions and patient deferred surgery and did not seek follow-up care either with the surgeons or with our department.  He also did not have follow-up appointments with gastroenterology.   His PCP referred him back to his and he reestablished care with us  on 01/30/2024.  CEA was normal at 1.68.  CBCD and CMP were unremarkable.  Plan made for restaging CT scan of the chest, abdomen and pelvis and referral sent to GI for endoscopic evaluation.  Patient cannot get MRIs because of nerve stimulator in place.   On 02/08/2024, restaging CT chest, abdomen and pelvis showed multiple lung nodules, largest 1 measuring 1.9 cm, concerning for pulmonary metastatic disease.  Previously seen circumferential superior rectal mass was no longer distinctly visible.  No evidence of lymphadenopathy or metastatic disease in the abdomen or pelvis.   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  No evidence of metastatic adenopathy in the chest or evidence of other metastatic disease in the abdomen or pelvis.  On 02/27/2024, Dr. Shelah performed flexible video fiberoptic bronchoscopy with robotic assistance and biopsies of right upper lobe lung nodule.  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  Immunostains were positive for CK20 and CDX2, negative for CK7 and TTF-1, consistent with a colorectal primary.  cTx,cNx,pM1a, Stage IV A disease.   All treatment options are palliative in nature, given stage IV disease.     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  MSI stable.  Overall no actionable mutations.  He had an appointment on 03/15/2024 with cardiothoracic surgery Dr. Shyrl for possible resection of the lung lesions:  he likely is out of his radiation window to undergo rectal cancer resection. If this cannot be resected there is minimal advantage to resect the pulmonary nodules   Palliative systemic treatment with FOLFOX started from 03/28/2024.  Avastin  was added from cycle 2 onwards.   Following completion of 6 cycles of FOLFOX plus Avastin , restaging CT chest, abdomen and pelvis on 06/14/2024 showed significant improvement in lung nodules.  Nonspecific 11 mm hypointense nodular focus in the anterior segment 5 of the liver may reflect focal fatty sparing, although hepatic metastasis could not be ruled out.  Patient cannot undergo MRIs.  Hence plan to monitor this.  Plan to continue current management.  Because of progressive mucositis, skin changes, mouth sores, we started skipping 5-FU bolus dose from cycle 8 onwards (07/02/2024).   Because of thrombocytopenia and progressive neuropathy, we started dose reducing oxaliplatin  by 25% from cycle 10 onwards.  Completed 12 cycles of FOLFOX plus bevacizumab  as of 08/26/2024.  Restaging CT chest, abdomen and pelvis on 09/18/2024 showed similar rectal wall thickening with healed pulmonary metastases in the upper lobes. No evidence of disease progression. 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  Switched treatments to maintenance 5-FU plus bevacizumab  from January 2026.  Oncology History  Adenocarcinoma of rectum (HCC)  11/07/2019 Initial Diagnosis   Adenocarcinoma of rectum (HCC)   03/12/2024 Cancer Staging   Staging form: Colon and Rectum, AJCC 8th Edition - Clinical stage from 03/12/2024: Stage IVA (rcTX, rcNX, rpM1a) - Signed by Autumn Millman, MD on 03/12/2024 Stage  prefix: Recurrence   03/28/2024 -  Chemotherapy   Patient is on Treatment Plan : COLORECTAL FOLFOX + Bevacizumab  q14d         REVIEW OF SYSTEMS:   Review of Systems - Oncology  All other pertinent systems were reviewed with the patient and are negative.  ALLERGIES: He is allergic to latex.  MEDICATIONS:  Current Outpatient Medications  Medication Sig Dispense Refill   albuterol  (PROVENTIL ) (2.5 MG/3ML) 0.083% nebulizer solution Take 2.5 mg by nebulization every 6 (six) hours as needed for wheezing or shortness of breath.      albuterol  (VENTOLIN  HFA) 108 (90 Base) MCG/ACT inhaler Inhale 1-2 puffs into the lungs every 6 (six) hours as needed for wheezing or shortness of breath. 1 Inhaler 5   aspirin  81 MG chewable tablet Chew 1 tablet (81 mg total) by mouth daily. 90 tablet 3   atorvastatin  (LIPITOR) 80 MG tablet Take 1 tablet (80 mg total) by mouth daily. 90 tablet 3   clopidogrel  (PLAVIX ) 75 MG tablet Take 1 tablet (75 mg total) by mouth daily. 90 tablet 1   cyclobenzaprine (FLEXERIL) 10 MG tablet TAKE 1 TABLET BY MOUTH EVERY 8 HOURS AS NEEDED FOR PAIN OR SPASMS. 60 tablet 0   furosemide  (LASIX ) 20 MG tablet Take 1 tablet (20 mg total) by mouth daily. 90 tablet 3   lidocaine -prilocaine  (EMLA ) cream Apply to affected area once 30 g 3   LINZESS  145 MCG CAPS capsule TAKE 1 CAPSULE (145 MCG TOTAL) BY MOUTH DAILY BEFORE BREAKFAST 30 capsule 1   magic mouthwash (nystatin , lidocaine , diphenhydrAMINE, alum & mag hydroxide) suspension Take 5 mLs by mouth every 4 (four) hours as needed for mouth pain. 500 mL 6   methadone  (DOLOPHINE ) 10 MG tablet Take 1  tablet (10 mg total) by mouth in the morning, at noon, in the evening, and at bedtime. 120 tablet 0   methylphenidate  (RITALIN ) 20 MG tablet Take 1 tablet (20 mg total) by mouth 3 (three) times daily for 15 days. 45 tablet 0   metoprolol  succinate (TOPROL -XL) 25 MG 24 hr tablet TAKE 1 TABLET BY MOUTH DAILY 100 tablet 3   nitroGLYCERIN   (NITROSTAT ) 0.4 MG SL tablet Place 1 tablet (0.4 mg total) under the tongue every 5 (five) minutes x 3 doses as needed for chest pain. 25 tablet 3   ondansetron  (ZOFRAN ) 8 MG tablet TAKE 1 TABLET BY MOUTH EVERY 8 HOURS AS NEEDED FOR NAUSEA OR VOMITING. START ON THE THIRD DAY AFTER CHEMOTHERAPY. 30 tablet 1   polyethylene glycol (MIRALAX) 17 g packet Take 17 g by mouth daily.     pregabalin  (LYRICA ) 50 MG capsule Take 1 capsule (50 mg total) by mouth daily. 30 capsule 3   prochlorperazine  (COMPAZINE ) 10 MG tablet TAKE 1 TABLET BY MOUTH EVERY 6 HOURS AS NEEDED FOR NAUSEA OR VOMITING. 30 tablet 1   Rimegepant Sulfate (NURTEC) 75 MG TBDP Take 1 tablet (75 mg total) by mouth every other day. 16 tablet 2   sacubitril -valsartan  (ENTRESTO ) 24-26 MG Take 1 tablet by mouth 2 (two) times daily. 180 tablet 3   spironolactone  (ALDACTONE ) 25 MG tablet Take 0.5 tablets (12.5 mg total) by mouth daily. 45 tablet 3   sulfamethoxazole -trimethoprim  (BACTRIM  DS) 800-160 MG tablet Take 1 tablet by mouth 2 (two) times daily. 20 tablet 0   SUMAtriptan  (IMITREX ) 100 MG tablet TAKE 1 TABLET BY MOUTH AS NEEDED, MAY REPEAT IN 2 HOURS AS NEEDED IF HEADACHE PERSISTS OR REOCCURS. MAX 2 PER 24 HOURS. MAX 2 DAYS PER WEEK 12 tablet 1   tamsulosin (FLOMAX) 0.4 MG CAPS capsule TAKE 1 TO 2 CAPSULES BY MOUTH AT BEDTIME WITH MEAL FOR PROSTATE. **DUE FOR OFFICE VISIT 04/17/24** 180 capsule 0   testosterone  cypionate (DEPOTESTOSTERONE CYPIONATE) 200 MG/ML injection INJECT 0.75 ML IN THE MUSCLE EVERY 14 DAYS. DUE FOR OFFICE VISIT. 10 mL 2   traZODone  (DESYREL ) 150 MG tablet Take 0.5 tablets (75 mg total) by mouth at bedtime as needed for sleep.     VITAMIN D -VITAMIN K PO Take 1 capsule by mouth daily. 10,000IU/ 100 mcg     Wheat Dextrin (BENEFIBER PO) Take 1 tablet by mouth 3 (three) times daily as needed (Constipation per wating report).     dexamethasone  (DECADRON ) 4 MG tablet TAKE 2 TABLETS (8 MG TOTAL) BY MOUTH DAILY. START THE DAY AFTER  CHEMOTHERAPY FOR 2 DAYS. TAKE WITH FOOD. (Patient not taking: Reported on 09/23/2024) 30 tablet 1   No current facility-administered medications for this visit.   Facility-Administered Medications Ordered in Other Visits  Medication Dose Route Frequency Provider Last Rate Last Admin   0.9 %  sodium chloride  infusion   Intravenous Continuous Alira Fretwell, MD   Stopped at 09/23/24 1326   clindamycin  (CLEOCIN ) 900 mg in dextrose  5 % 50 mL IVPB  900 mg Intravenous 60 min Pre-Op Debby Hila, MD       And   gentamicin  (GARAMYCIN ) 5 mg/kg in dextrose  5 % 50 mL IVPB  5 mg/kg Intravenous 60 min Pre-Op Debby Hila, MD       dextrose  5 % solution   Intravenous Continuous Kalayla Shadden, MD       fluorouracil  (ADRUCIL ) 5,000 mg in sodium chloride  0.9 % 150 mL chemo infusion  2,400 mg/m2 (Treatment  Plan Recorded) Intravenous 1 day or 1 dose Jaria Conway, MD   Infusion Verify at 09/23/24 1400     VITALS:   Blood pressure 132/83, pulse 65, temperature 98.7 F (37.1 C), temperature source Temporal, resp. rate 16, weight 210 lb 12.8 oz (95.6 kg), SpO2 96%.  Wt Readings from Last 3 Encounters:  09/23/24 210 lb 12.8 oz (95.6 kg)  08/26/24 213 lb 3.2 oz (96.7 kg)  08/12/24 212 lb 8 oz (96.4 kg)    Body mass index is 31.13 kg/m.    Onc Performance Status - 09/23/24 0954       ECOG Perf Status   ECOG Perf Status Ambulatory and capable of all selfcare but unable to carry out any work activities.  Up and about more than 50% of waking hours      KPS SCALE   KPS % SCORE Requires occasional assistance but is able to care for most needs           PHYSICAL EXAM:   Physical Exam Constitutional:      General: He is not in acute distress.    Appearance: Normal appearance.  HENT:     Head: Normocephalic and atraumatic.  Eyes:     Conjunctiva/sclera: Conjunctivae normal.  Cardiovascular:     Rate and Rhythm: Normal rate and regular rhythm.  Pulmonary:     Effort: Pulmonary effort is  normal. No respiratory distress.  Abdominal:     General: There is no distension.  Neurological:     General: No focal deficit present.     Mental Status: He is alert and oriented to person, place, and time.  Psychiatric:        Mood and Affect: Mood normal.        Behavior: Behavior normal.      LABORATORY DATA:   I have reviewed the data as listed.  Results for orders placed or performed in visit on 09/23/24  Urinalysis, Complete w Microscopic  Result Value Ref Range   Color, Urine YELLOW YELLOW   APPearance CLEAR CLEAR   Specific Gravity, Urine 1.014 1.005 - 1.030   pH 6.5 5.0 - 8.0   Glucose, UA NEGATIVE NEGATIVE mg/dL   Hgb urine dipstick NEGATIVE NEGATIVE   Bilirubin Urine NEGATIVE NEGATIVE   Ketones, ur NEGATIVE NEGATIVE mg/dL   Protein, ur NEGATIVE NEGATIVE mg/dL   Nitrite NEGATIVE NEGATIVE   Leukocytes,Ua NEGATIVE NEGATIVE   RBC / HPF 0-5 0 - 5 RBC/hpf   WBC, UA 0-5 0 - 5 WBC/hpf   Bacteria, UA NONE SEEN NONE SEEN   Squamous Epithelial / HPF 0-5 0 - 5 /HPF   Mucus PRESENT   Results for orders placed or performed in visit on 09/23/24  Total Protein, Urine dipstick  Result Value Ref Range   Protein, ur NEGATIVE NEGATIVE mg/dL  CMP (Cancer Center only)  Result Value Ref Range   Sodium 143 135 - 145 mmol/L   Potassium 3.3 (L) 3.5 - 5.1 mmol/L   Chloride 105 98 - 111 mmol/L   CO2 27 22 - 32 mmol/L   Glucose, Bld 106 (H) 70 - 99 mg/dL   BUN 5 (L) 8 - 23 mg/dL   Creatinine 9.34 9.38 - 1.24 mg/dL   Calcium  9.3 8.9 - 10.3 mg/dL   Total Protein 6.8 6.5 - 8.1 g/dL   Albumin 4.0 3.5 - 5.0 g/dL   AST 30 15 - 41 U/L   ALT 22 0 - 44 U/L   Alkaline Phosphatase 87  38 - 126 U/L   Total Bilirubin 0.4 0.0 - 1.2 mg/dL   GFR, Estimated >39 >39 mL/min   Anion gap 11 5 - 15  CBC with Differential (Cancer Center Only)  Result Value Ref Range   WBC Count 4.3 4.0 - 10.5 K/uL   RBC 3.92 (L) 4.22 - 5.81 MIL/uL   Hemoglobin 12.2 (L) 13.0 - 17.0 g/dL   HCT 62.2 (L) 60.9 - 47.9  %   MCV 96.2 80.0 - 100.0 fL   MCH 31.1 26.0 - 34.0 pg   MCHC 32.4 30.0 - 36.0 g/dL   RDW 83.8 (H) 88.4 - 84.4 %   Platelet Count 152 150 - 400 K/uL   nRBC 0.0 0.0 - 0.2 %   Neutrophils Relative % 63 %   Neutro Abs 2.7 1.7 - 7.7 K/uL   Lymphocytes Relative 21 %   Lymphs Abs 0.9 0.7 - 4.0 K/uL   Monocytes Relative 13 %   Monocytes Absolute 0.6 0.1 - 1.0 K/uL   Eosinophils Relative 2 %   Eosinophils Absolute 0.1 0.0 - 0.5 K/uL   Basophils Relative 1 %   Basophils Absolute 0.0 0.0 - 0.1 K/uL   Immature Granulocytes 0 %   Abs Immature Granulocytes 0.01 0.00 - 0.07 K/uL      RADIOGRAPHIC STUDIES:  CT CHEST ABDOMEN PELVIS W CONTRAST CLINICAL DATA:  Stage IV rectal cancer with pulmonary metastatic disease, restaging. * Tracking Code: BO *  EXAM: CT CHEST, ABDOMEN, AND PELVIS WITH CONTRAST  TECHNIQUE: Multidetector CT imaging of the chest, abdomen and pelvis was performed following the standard protocol during bolus administration of intravenous contrast.  RADIATION DOSE REDUCTION: This exam was performed according to the departmental dose-optimization program which includes automated exposure control, adjustment of the mA and/or kV according to patient size and/or use of iterative reconstruction technique.  CONTRAST:  OMNIPAQUE  IOHEXOL  300 MG/ML  SOLN  COMPARISON:  06/14/2024.  FINDINGS: CT CHEST FINDINGS  Cardiovascular: Right IJ Port-A-Cath terminates in the right atrium. Atherosclerotic calcification of the aortic valve and coronary arteries. Heart is mildly enlarged. No pericardial effusion.  Mediastinum/Nodes: No pathologically enlarged mediastinal, hilar or axillary lymph nodes. Esophagus is grossly unremarkable.  Lungs/Pleura: Cystic scarring in the apical segment right upper lobe (4/31) and anterior segment left upper lobe (4/51), unchanged. No new or suspicious pulmonary nodules. No pleural fluid. Airway is unremarkable.  Musculoskeletal: Spinal  stimulator wires. No worrisome lytic or sclerotic lesions.  CT ABDOMEN PELVIS FINDINGS  Hepatobiliary: Liver is unremarkable. Previously questioned hypodense lesion in segment V is not readily appreciated. Cholecystectomy. No unexpected biliary ductal dilatation.  Pancreas: Negative.  Spleen: Negative.  Adrenals/Urinary Tract: Adrenal glands and kidneys are unremarkable. Ureters are decompressed. Bladder is somewhat thick-walled.  Stomach/Bowel: Stomach, small bowel, appendix and colon are unremarkable. Similar mild rectal wall thickening, most notable distally (3/116), with mild perirectal haziness.  Vascular/Lymphatic: Incidental note is made of 3 left renal arteries. Vascular structures are otherwise unremarkable. No pathologically enlarged lymph nodes.  Reproductive: Prostate is visualized.  Other: No free fluid.  Musculoskeletal: Degenerative changes in the spine. No worrisome lytic or sclerotic lesions. There may be mild changes of avascular necrosis in the left femoral head.  IMPRESSION: 1. Similar rectal wall thickening with healed pulmonary metastases in the upper lobes. No evidence of disease progression. 2. 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 3. Aortic atherosclerosis (ICD10-I70.0). Coronary artery calcification.  Electronically Signed   By: Newell Eke M.D.   On: 09/23/2024  08:46    CODE STATUS:  Code Status History     Date Active Date Inactive Code Status Order ID Comments User Context   11/27/2023 2206 11/29/2023 1747 Full Code [id number]  Cesario Chamorro, MD Inpatient    Questions for Most Recent Historical Code Status (Order [id number])     Question Answer   By: Consent: discussion documented in EHR            Orders Placed This Encounter  Procedures   CBC with Differential (Cancer Center Only)    Standing Status:   Future    Expected Date:   10/21/2024    Expiration Date:   10/21/2025   CMP (Cancer Center only)     Standing Status:   Future    Expected Date:   10/21/2024    Expiration Date:   10/21/2025   Total Protein, Urine dipstick    Standing Status:   Future    Expected Date:   10/21/2024    Expiration Date:   10/21/2025   CBC with Differential (Cancer Center Only)    Standing Status:   Future    Expected Date:   11/04/2024    Expiration Date:   11/04/2025   CMP (Cancer Center only)    Standing Status:   Future    Expected Date:   11/04/2024    Expiration Date:   11/04/2025   Total Protein, Urine dipstick    Standing Status:   Future    Expected Date:   11/04/2024    Expiration Date:   11/04/2025   Urinalysis, Complete w Microscopic    Standing Status:   Future    Number of Occurrences:   1    Expiration Date:   09/23/2025     Future Appointments  Date Time Provider Department Center  09/25/2024 11:30 AM [facility] None  10/07/2024  8:30 AM [facility] None  10/07/2024  9:30 AM Lanna Labella, MD [facility] None  10/07/2024 10:00 AM [facility] None  10/07/2024 10:30 AM Daryle Heron CROME, RD [facility] None  10/09/2024  2:00 PM [facility] None  10/21/2024  9:30 AM [facility] None  10/21/2024 10:30 AM Alesa Echevarria, MD [facility] None  10/21/2024 11:00 AM [facility] None  10/23/2024  3:00 PM [facility] None  11/04/2024  8:00 AM [facility] None  11/04/2024  8:45 AM Cerita Rabelo, MD [facility] None  11/04/2024  9:15 AM [facility] None  11/06/2024  1:30 PM [facility] None  01/14/2025  8:45 AM [facility]  01/21/2025  9:30 AM Chandra Toribio POUR, MD [facility]    This document was completed utilizing engineer, civil (consulting). Grammatical errors, random word insertions, pronoun errors, and incomplete sentences are an occasional consequence of this system due to software limitations, ambient noise, and hardware  issues. Any formal questions or concerns about the content, text or information contained within the body of this dictation should be directly addressed to the provider for clarification.    "

## 2024-09-23 NOTE — Assessment & Plan Note (Addendum)
 Please review oncology history for additional details and timeline of events.  Patient was originally diagnosed with rectal cancer in 2021 and was treated with concurrent chemoradiation with Xeloda  with eventual plans for surgery.  He was lost to follow-up after that.  Restaging CT chest abdomen pelvis on 02/08/2024 suggested multiple lung nodules, concerning for metastatic recurrence.  On 02/27/2024, Dr. Shelah performed flexible video fiberoptic bronchoscopy with robotic assistance and biopsies of right upper lobe lung nodule.  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  Immunostains were positive for CK20 and CDX2, negative for CK7 and TTF-1, consistent with a colorectal primary.  cTx,cNx,pM1a, Stage IV A disease.  MSI stable.  NGS testing on the specimen showed no actionable mutations.  All treatment options are palliative in nature, given stage IV disease.    He had an appointment on 03/15/2024 with cardiothoracic surgery Dr. Shyrl for possible resection of the lung lesions:  he likely is out of his radiation window to undergo rectal cancer resection. If this cannot be resected there is minimal advantage to resect the pulmonary nodules   Palliative systemic treatment with FOLFOX started from 03/28/2024.  Avastin  was added from cycle 2 onwards.   Tolerating chemotherapy reasonably well overall with some expected side effects including mouth sores.  Symptoms controlled with supportive therapy including mouth rinses with baking soda and salt.  We prescribed Magic mouthwash previously.  Following completion of 6 cycles of FOLFOX plus Avastin , restaging CT chest, abdomen and pelvis on 06/14/2024 showed significant improvement in lung nodules.  Nonspecific 11 mm hypointense nodular focus in the anterior segment 5 of the liver may reflect focal fatty sparing, although hepatic metastasis could not be ruled out.  Patient cannot undergo MRIs.  Hence plan to monitor this.  Plan to continue current  management. Because of progressive mucositis, skin changes, mouth sores, we will start skipping 5-FU bolus dose from cycle 8 onwards (07/02/2024).  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  Will proceed with 25% dose reduction going forward.  Completed 12 cycles of FOLFOX as of 08/26/2024.  Restaging CT chest, abdomen and pelvis on 09/18/2024 showed similar rectal wall thickening with healed pulmonary metastases in the upper lobes. No evidence of disease progression. 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  Overall this is consistent with excellent response.  Plan is to switch to maintenance treatments with 5-FU plus Avastin .  Labs today reveal no dose-limiting toxicities.  Will proceed with maintenance therapy starting from tomorrow and continue this every 2 weeks.  RTC in 2 weeks for follow-up with repeat labs and continuation of maintenance treatment

## 2024-09-23 NOTE — Progress Notes (Signed)
 Patient seen by Dr. Archie Patten Pasam today  Vitals are within treatment parameters:Yes   Labs are within treatment parameters: Yes   Treatment plan has been signed: Yes   Per physician team, Patient is ready for treatment and there are NO modifications to the treatment plan.

## 2024-09-23 NOTE — Patient Instructions (Signed)
 [facility]  Discharge Instructions: Thank you for choosing [facility] to provide your oncology and hematology care.   If you have a lab appointment with the Cancer Center, please go directly to the Cancer Center and check in at the registration area.   Wear comfortable clothing and clothing appropriate for easy access to any Portacath or PICC line.   We strive to give you quality time with your provider. You may need to reschedule your appointment if you arrive late (15 or more minutes).  Arriving late affects you and other patients whose appointments are after yours.  Also, if you miss three or more appointments without notifying the office, you may be dismissed from the clinic at the provider's discretion.      For prescription refill requests, have your pharmacy contact our office and allow 72 hours for refills to be completed.    Today you received the following chemotherapy and/or immunotherapy agents: bevacizumab, leucovorin, fluorouracil      To help prevent nausea and vomiting after your treatment, we encourage you to take your nausea medication as directed.  BELOW ARE SYMPTOMS THAT SHOULD BE REPORTED IMMEDIATELY: *FEVER GREATER THAN 100.4 F (38 C) OR HIGHER *CHILLS OR SWEATING *NAUSEA AND VOMITING THAT IS NOT CONTROLLED WITH YOUR NAUSEA MEDICATION *UNUSUAL SHORTNESS OF BREATH *UNUSUAL BRUISING OR BLEEDING *URINARY PROBLEMS (pain or burning when urinating, or frequent urination) *BOWEL PROBLEMS (unusual diarrhea, constipation, pain near the anus) TENDERNESS IN MOUTH AND THROAT WITH OR WITHOUT PRESENCE OF ULCERS (sore throat, sores in mouth, or a toothache) UNUSUAL RASH, SWELLING OR PAIN  UNUSUAL VAGINAL DISCHARGE OR ITCHING   Items with * indicate a potential emergency and should be followed up as soon as possible or go to the Emergency Department if any problems should occur.  Please show the CHEMOTHERAPY  ALERT CARD or IMMUNOTHERAPY ALERT CARD at check-in to the Emergency Department and triage nurse.  Should you have questions after your visit or need to cancel or reschedule your appointment, please contact [facility]  Dept: [phone]  and follow the prompts.  Office hours are 8:00 a.m. to 4:30 p.m. Monday - Friday. Please note that voicemails left after 4:00 p.m. may not be returned until the following business day.  We are closed weekends and major holidays. You have access to a nurse at all times for urgent questions. Please call the main number to the clinic Dept: [phone] and follow the prompts.   For any non-urgent questions, you may also contact your provider using MyChart. We now offer e-Visits for anyone 68 and older to request care online for non-urgent symptoms. For details visit [email or web address].   Also download the MyChart app! Go to the app store, search "MyChart", open the app, select [place], and log in with your MyChart username and password.

## 2024-09-25 ENCOUNTER — Inpatient Hospital Stay: Payer: Self-pay

## 2024-09-25 NOTE — Progress Notes (Signed)
 Patient presents today for Pump Stop. Patient's port flushed without difficulty. Good blood return noted with no bruising or swelling noted at site. Pump removed without issue. Band aid applied.  VSS with discharge and left in satisfactory condition with no s/s of distress noted.

## 2024-09-25 NOTE — Patient Instructions (Signed)
 [facility] - A DEPT OF [place]  Discharge Instructions: Thank you for choosing [facility] to provide your oncology and hematology care.   If you have a lab appointment with the Cancer Center, please go directly to the Cancer Center and check in at the registration area.   Wear comfortable clothing and clothing appropriate for easy access to any Portacath or PICC line.   We strive to give you quality time with your provider. You may need to reschedule your appointment if you arrive late (15 or more minutes).  Arriving late affects you and other patients whose appointments are after yours.  Also, if you miss three or more appointments without notifying the office, you may be dismissed from the clinic at the provider's discretion.      For prescription refill requests, have your pharmacy contact our office and allow 72 hours for refills to be completed.    Today you received the following PUMP STOP.   To help prevent nausea and vomiting after your treatment, we encourage you to take your nausea medication as directed.  BELOW ARE SYMPTOMS THAT SHOULD BE REPORTED IMMEDIATELY: *FEVER GREATER THAN 100.4 F (38 C) OR HIGHER *CHILLS OR SWEATING *NAUSEA AND VOMITING THAT IS NOT CONTROLLED WITH YOUR NAUSEA MEDICATION *UNUSUAL SHORTNESS OF BREATH *UNUSUAL BRUISING OR BLEEDING *URINARY PROBLEMS (pain or burning when urinating, or frequent urination) *BOWEL PROBLEMS (unusual diarrhea, constipation, pain near the anus) TENDERNESS IN MOUTH AND THROAT WITH OR WITHOUT PRESENCE OF ULCERS (sore throat, sores in mouth, or a toothache) UNUSUAL RASH, SWELLING OR PAIN  UNUSUAL VAGINAL DISCHARGE OR ITCHING   Items with * indicate a potential emergency and should be followed up as soon as possible or go to the Emergency Department if any problems should occur.  Please show the CHEMOTHERAPY ALERT CARD or IMMUNOTHERAPY ALERT CARD at check-in to the Emergency  Department and triage nurse.  Should you have questions after your visit or need to cancel or reschedule your appointment, please contact [facility]  Dept: [phone]  and follow the prompts.  Office hours are 8:00 a.m. to 4:30 p.m. Monday - Friday. Please note that voicemails left after 4:00 p.m. may not be returned until the following business day.  We are closed weekends and major holidays. You have access to a nurse at all times for urgent questions. Please call the main number to the clinic Dept: [phone] and follow the prompts.   For any non-urgent questions, you may also contact your provider using MyChart. We now offer e-Visits for anyone 62 and older to request care online for non-urgent symptoms. For details visit [email or web address].   Also download the MyChart app! Go to the app store, search MyChart, open the app, select [place], and log in with your MyChart username and password.

## 2024-10-01 ENCOUNTER — Other Ambulatory Visit: Payer: Self-pay | Admitting: Oncology

## 2024-10-02 ENCOUNTER — Encounter: Payer: Self-pay | Admitting: Oncology

## 2024-10-02 ENCOUNTER — Other Ambulatory Visit: Payer: Self-pay | Admitting: Family Medicine

## 2024-10-04 ENCOUNTER — Telehealth: Payer: Self-pay

## 2024-10-04 NOTE — Telephone Encounter (Signed)
 Spoke directly with patient and advised him of his new start time for Monday 10/07/24 appointments at 1015. He was agreeable and verbalized understanding.

## 2024-10-06 ENCOUNTER — Telehealth: Payer: Self-pay

## 2024-10-06 NOTE — Telephone Encounter (Signed)
 Unable to reach patient, left a detailed voice message advising that Monday 10/07/24 appointments have been rescheduled.

## 2024-10-07 ENCOUNTER — Inpatient Hospital Stay: Payer: Self-pay

## 2024-10-07 ENCOUNTER — Telehealth: Payer: Self-pay

## 2024-10-07 ENCOUNTER — Inpatient Hospital Stay

## 2024-10-07 ENCOUNTER — Inpatient Hospital Stay: Attending: Oncology | Admitting: Oncology

## 2024-10-07 ENCOUNTER — Inpatient Hospital Stay: Attending: Oncology | Admitting: Nutrition

## 2024-10-07 DIAGNOSIS — G629 Polyneuropathy, unspecified: Secondary | ICD-10-CM

## 2024-10-07 DIAGNOSIS — C20 Malignant neoplasm of rectum: Secondary | ICD-10-CM

## 2024-10-07 MED ORDER — PREGABALIN 50 MG PO CAPS: 50.0000 mg | ORAL_CAPSULE | Freq: Two times a day (BID) | ORAL | 3 refills | Status: AC

## 2024-10-07 NOTE — Telephone Encounter (Signed)
 Spoke directly with patient's spouse Derrick Johnson advising of new appointments date and time, she was agreeable and verbalized understanding.

## 2024-10-08 ENCOUNTER — Encounter: Payer: Self-pay | Admitting: Oncology

## 2024-10-08 ENCOUNTER — Telehealth: Payer: Self-pay

## 2024-10-09 ENCOUNTER — Inpatient Hospital Stay

## 2024-10-09 VITALS — BP 121/68 | HR 71 | Temp 98.0°F | Resp 16

## 2024-10-09 DIAGNOSIS — C20 Malignant neoplasm of rectum: Secondary | ICD-10-CM

## 2024-10-09 LAB — TOTAL PROTEIN, URINE DIPSTICK: Protein, ur: NEGATIVE mg/dL

## 2024-10-09 LAB — CBC WITH DIFFERENTIAL (CANCER CENTER ONLY)
Abs Immature Granulocytes: 0.01 10*3/uL (ref 0.00–0.07)
Basophils Absolute: 0 10*3/uL (ref 0.0–0.1)
Basophils Relative: 1 %
Eosinophils Absolute: 0.2 10*3/uL (ref 0.0–0.5)
Eosinophils Relative: 6 %
HCT: 40.3 % (ref 39.0–52.0)
Hemoglobin: 13.4 g/dL (ref 13.0–17.0)
Immature Granulocytes: 0 %
Lymphocytes Relative: 13 %
Lymphs Abs: 0.5 10*3/uL — ABNORMAL LOW (ref 0.7–4.0)
MCH: 31.2 pg (ref 26.0–34.0)
MCHC: 33.3 g/dL (ref 30.0–36.0)
MCV: 93.7 fL (ref 80.0–100.0)
Monocytes Absolute: 0.3 10*3/uL (ref 0.1–1.0)
Monocytes Relative: 9 %
Neutro Abs: 2.8 10*3/uL (ref 1.7–7.7)
Neutrophils Relative %: 71 %
Platelet Count: 157 10*3/uL (ref 150–400)
RBC: 4.3 MIL/uL (ref 4.22–5.81)
RDW: 16 % — ABNORMAL HIGH (ref 11.5–15.5)
WBC Count: 4 10*3/uL (ref 4.0–10.5)
nRBC: 0 % (ref 0.0–0.2)

## 2024-10-09 LAB — CMP (CANCER CENTER ONLY)
ALT: 66 U/L — ABNORMAL HIGH (ref 0–44)
AST: 46 U/L — ABNORMAL HIGH (ref 15–41)
Albumin: 4 g/dL (ref 3.5–5.0)
Alkaline Phosphatase: 98 U/L (ref 38–126)
Anion gap: 12 (ref 5–15)
BUN: 8 mg/dL (ref 8–23)
CO2: 26 mmol/L (ref 22–32)
Calcium: 9.5 mg/dL (ref 8.9–10.3)
Chloride: 100 mmol/L (ref 98–111)
Creatinine: 0.9 mg/dL (ref 0.61–1.24)
GFR, Estimated: >60 mL/min
Glucose, Bld: 141 mg/dL — ABNORMAL HIGH (ref 70–99)
Potassium: 3.9 mmol/L (ref 3.5–5.1)
Sodium: 138 mmol/L (ref 135–145)
Total Bilirubin: 0.7 mg/dL (ref 0.0–1.2)
Total Protein: 7 g/dL (ref 6.5–8.1)

## 2024-10-09 MED ORDER — DEXAMETHASONE SOD PHOSPHATE PF 10 MG/ML IJ SOLN
10.0000 mg | Freq: Once | INTRAMUSCULAR | Status: AC
  Administered 2024-10-09: 10 mg via INTRAVENOUS
  Filled 2024-10-09: qty 1

## 2024-10-09 MED ORDER — SODIUM CHLORIDE 0.9 % IV SOLN
2400.0000 mg/m2 | INTRAVENOUS | Status: DC
  Administered 2024-10-09: 5000 mg via INTRAVENOUS
  Filled 2024-10-09: qty 100

## 2024-10-09 MED ORDER — PALONOSETRON HCL INJECTION 0.25 MG/5ML
0.2500 mg | Freq: Once | INTRAVENOUS | Status: AC
  Administered 2024-10-09: 0.25 mg via INTRAVENOUS
  Filled 2024-10-09: qty 5

## 2024-10-09 MED ORDER — SODIUM CHLORIDE 0.9 % IV SOLN: INTRAVENOUS | Status: DC

## 2024-10-09 MED ORDER — SODIUM CHLORIDE 0.9 % IV SOLN
400.0000 mg/m2 | Freq: Once | INTRAVENOUS | Status: AC
  Administered 2024-10-09: 880 mg via INTRAVENOUS
  Filled 2024-10-09: qty 44

## 2024-10-09 MED ORDER — SODIUM CHLORIDE 0.9 % IV SOLN
5.0000 mg/kg | Freq: Once | INTRAVENOUS | Status: AC
  Administered 2024-10-09: 500 mg via INTRAVENOUS
  Filled 2024-10-09: qty 16

## 2024-10-09 NOTE — Patient Instructions (Signed)
Implanted Port Removal, Care After The following information offers guidance on how to care for yourself after your procedure. Your health care provider may also give you more specific instructions. If you have problems or questions, contact your health care provider. What can I expect after the procedure? After the procedure, it is common to have: Soreness or pain near your incision. Some swelling or bruising near your incision. Follow these instructions at home: Medicines Take over-the-counter and prescription medicines only as told by your health care provider. If you were prescribed an antibiotic medicine, take it as told by your health care provider. Do not stop taking the antibiotic even if you start to feel better. Bathing Do not take baths, swim, or use a hot tub until your health care provider approves. Ask your health care provider if you can take showers. You may only be allowed to take sponge baths. Incision care  Follow instructions from your health care provider about how to take care of your incision. Make sure you: Wash your hands with soap and water for at least 20 seconds before and after you change your bandage (dressing). If soap and water are not available, use hand sanitizer. Change your dressing as told by your health care provider. Keep your dressing dry. Leave stitches (sutures), skin glue, or adhesive strips in place. These skin closures may need to stay in place for 2 weeks or longer. If adhesive strip edges start to loosen and curl up, you may trim the loose edges. Do not remove adhesive strips completely unless your health care provider tells you to do that. Check your incision area every day for signs of infection. Check for: More redness, swelling, or pain. More fluid or blood. Warmth. Pus or a bad smell. Activity Return to your normal activities as told by your health care provider. Ask your health care provider what activities are safe for you. You may have  to avoid lifting. Ask your health care provider how much you can safely lift. Do not do activities that involve lifting your arms over your head. Driving  If you were given a sedative during the procedure, it can affect you for several hours. Do not drive or operate machinery until your health care provider says that it is safe. If you did not receive a sedative, ask your health care provider when it is safe to drive. General instructions Do not use any products that contain nicotine or tobacco. These products include cigarettes, chewing tobacco, and vaping devices, such as e-cigarettes. These can delay healing after surgery. If you need help quitting, ask your health care provider. Keep all follow-up visits. This is important. Contact a health care provider if: You have a fever or chills. You have more redness, swelling, or pain around your incision. You have more fluid or blood coming from your incision. Your incision feels warm to the touch. You have pus or a bad smell coming from your incision. You have pain that is not relieved by your pain medicine. Get help right away if: You have chest pain. You have difficulty breathing. These symptoms may be an emergency. Get help right away. Call 911. Do not wait to see if the symptoms will go away. Do not drive yourself to the hospital. Summary After the procedure, it is common to have pain, soreness, swelling, or bruising near your incision. If you were prescribed an antibiotic medicine, take it as told by your health care provider. Do not stop taking the antibiotic even if you   start to feel better. If you were given a sedative during the procedure, it can affect you for several hours. Do not drive or operate machinery until your health care provider says that it is safe. Return to your normal activities as told by your health care provider. Ask your health care provider what activities are safe for you. This information is not intended to  replace advice given to you by your health care provider. Make sure you discuss any questions you have with your health care provider. Document Revised: 02/23/2021 Document Reviewed: 02/23/2021 Elsevier Patient Education  2024 [employer]  

## 2024-10-11 ENCOUNTER — Inpatient Hospital Stay

## 2024-10-11 VITALS — BP 107/64 | HR 63 | Temp 98.2°F | Resp 18

## 2024-10-11 DIAGNOSIS — C20 Malignant neoplasm of rectum: Secondary | ICD-10-CM

## 2024-10-11 NOTE — Patient Instructions (Signed)
 Getting a Device With a Small Disc Placed for Long-Term IV Use (Implanted Port Insertion): What to Know After After having a device called a port placed for long-term IV use (implanted port insertion), it's common to have some discomfort at the site where the port was put in. You may also see some bruising on your skin. This should get better over 3-4 days. Follow these instructions at home: Caring for your port Your health care provider will give you a card with information from your port's manufacturer. Keep this with you at all times. Take care of the port as told. Ask your provider if you or a family member can be trained in how to take care of the port at home. You may also be able to get help from a home health care nurse. Write down what type of port you have. Caring for your port site  Take care of your port site as told. Make sure you: Wash your hands with soap and water for at least 20 seconds before and after you change your bandage. If you can't use soap and water, use hand sanitizer. Change your bandage. Leave stitches or skin glue alone. Leave tape strips alone unless you're told to take them off. You may trim the edges of the tape strips if they curl up. Check the area around your port site every day for signs of infection. Check for: Redness, swelling, or pain. Fluid or blood. Warmth. Pus or a bad smell. Activity Ask if it's OK for you to lift. Ask what things are safe for you to do at home. Ask when you can go back to work or school. General instructions Take your medicines only as told. If you were given a sedative, do not drive or use machines until you're told it's safe. A sedative can make you sleepy. Do not take baths, swim, or use a hot tub until you're told it's OK. Ask if you can shower. Wear an alert bracelet in case of an emergency. This will tell providers that you have a port. Contact a health care provider if: You can't flush your port or draw blood from  your port as told. You have a fever or chills. You have any signs of infection. Get help right away if: You have chest pain. You feel short of breath. You have bleeding from your port that you can't control. These symptoms may be an emergency. Call 911 right away. Do not wait to see if the symptoms will go away. Do not drive yourself to the hospital. This information is not intended to replace advice given to you by your health care provider. Make sure you discuss any questions you have with your health care provider. Document Revised: 05/31/2024 Document Reviewed: 05/31/2024 Elsevier Patient Education  2025 [employer].

## 2024-10-21 ENCOUNTER — Inpatient Hospital Stay: Admitting: Oncology

## 2024-10-21 ENCOUNTER — Inpatient Hospital Stay

## 2024-10-23 ENCOUNTER — Inpatient Hospital Stay

## 2024-11-04 ENCOUNTER — Inpatient Hospital Stay

## 2024-11-04 ENCOUNTER — Inpatient Hospital Stay: Admitting: Oncology

## 2024-11-06 ENCOUNTER — Inpatient Hospital Stay

## 2025-01-14 ENCOUNTER — Other Ambulatory Visit

## 2025-01-21 ENCOUNTER — Encounter: Admitting: Family Medicine
